# Patient Record
Sex: Female | Born: 1960
Health system: Southern US, Community
[De-identification: ages and names within clinical notes are randomized; demographics above are authoritative.]

## PROBLEM LIST (undated history)

## (undated) DIAGNOSIS — C801 Malignant (primary) neoplasm, unspecified: Secondary | ICD-10-CM

## (undated) DIAGNOSIS — C189 Malignant neoplasm of colon, unspecified: Secondary | ICD-10-CM

## (undated) DIAGNOSIS — R109 Unspecified abdominal pain: Secondary | ICD-10-CM

## (undated) DIAGNOSIS — E119 Type 2 diabetes mellitus without complications: Secondary | ICD-10-CM

## (undated) DIAGNOSIS — K469 Unspecified abdominal hernia without obstruction or gangrene: Secondary | ICD-10-CM

## (undated) DIAGNOSIS — T8859XA Other complications of anesthesia, initial encounter: Secondary | ICD-10-CM

## (undated) DIAGNOSIS — Z8719 Personal history of other diseases of the digestive system: Secondary | ICD-10-CM

## (undated) DIAGNOSIS — T4145XA Adverse effect of unspecified anesthetic, initial encounter: Secondary | ICD-10-CM

## (undated) DIAGNOSIS — R51 Headache: Secondary | ICD-10-CM

## (undated) DIAGNOSIS — R519 Headache, unspecified: Secondary | ICD-10-CM

## (undated) DIAGNOSIS — R198 Other specified symptoms and signs involving the digestive system and abdomen: Secondary | ICD-10-CM

## (undated) DIAGNOSIS — D649 Anemia, unspecified: Secondary | ICD-10-CM

## (undated) HISTORY — PX: COLON SURGERY: SHX602

## (undated) HISTORY — DX: Unspecified abdominal pain: R10.9

## (undated) HISTORY — PX: COLONOSCOPY: SHX174

## (undated) HISTORY — PX: POLYPECTOMY: SHX149

## (undated) HISTORY — DX: Personal history of other diseases of the digestive system: Z87.19

## (undated) HISTORY — DX: Malignant (primary) neoplasm, unspecified: C80.1

## (undated) HISTORY — DX: Other specified symptoms and signs involving the digestive system and abdomen: R19.8

## (undated) HISTORY — DX: Unspecified abdominal hernia without obstruction or gangrene: K46.9

## (undated) HISTORY — PX: NOSE SURGERY: SHX723

## (undated) HISTORY — DX: Malignant neoplasm of colon, unspecified: C18.9

## (undated) HISTORY — PX: OTHER SURGICAL HISTORY: SHX169

---

## 1999-12-01 ENCOUNTER — Emergency Department (HOSPITAL_COMMUNITY): Admission: EM | Admit: 1999-12-01 | Discharge: 1999-12-01 | Payer: Self-pay | Admitting: Emergency Medicine

## 2000-04-08 ENCOUNTER — Encounter: Payer: Self-pay | Admitting: Family Medicine

## 2000-04-08 ENCOUNTER — Encounter: Admission: RE | Admit: 2000-04-08 | Discharge: 2000-04-08 | Payer: Self-pay | Admitting: Family Medicine

## 2000-10-13 ENCOUNTER — Ambulatory Visit (HOSPITAL_BASED_OUTPATIENT_CLINIC_OR_DEPARTMENT_OTHER): Admission: RE | Admit: 2000-10-13 | Discharge: 2000-10-13 | Payer: Self-pay | Admitting: Otolaryngology

## 2001-01-23 ENCOUNTER — Emergency Department (HOSPITAL_COMMUNITY): Admission: EM | Admit: 2001-01-23 | Discharge: 2001-01-23 | Payer: Self-pay | Admitting: Emergency Medicine

## 2001-02-05 ENCOUNTER — Ambulatory Visit: Admission: RE | Admit: 2001-02-05 | Discharge: 2001-02-05 | Payer: Self-pay | Admitting: Otolaryngology

## 2001-02-14 ENCOUNTER — Ambulatory Visit (HOSPITAL_COMMUNITY): Admission: RE | Admit: 2001-02-14 | Discharge: 2001-02-14 | Payer: Self-pay | Admitting: Otolaryngology

## 2001-09-25 ENCOUNTER — Encounter: Payer: Self-pay | Admitting: Gastroenterology

## 2001-09-25 ENCOUNTER — Encounter: Admission: RE | Admit: 2001-09-25 | Discharge: 2001-09-25 | Payer: Self-pay | Admitting: Gastroenterology

## 2001-11-07 ENCOUNTER — Ambulatory Visit (HOSPITAL_COMMUNITY): Admission: RE | Admit: 2001-11-07 | Discharge: 2001-11-07 | Payer: Self-pay | Admitting: Gastroenterology

## 2003-05-05 IMAGING — CR DG HAND COMPLETE 3+V*L*
3 series · 3 of 3 positions shown · non-contrast
Comparison: none

CLINICAL DATA: Pain with knot in the base of the first and second metacarpals. 
 LEFT HAND ? THREE VIEW:
 Three views of the left hand reveal no acute bony abnormality.  Minimal degenerative changes are noted.

[view not recorded (1 of 3)]
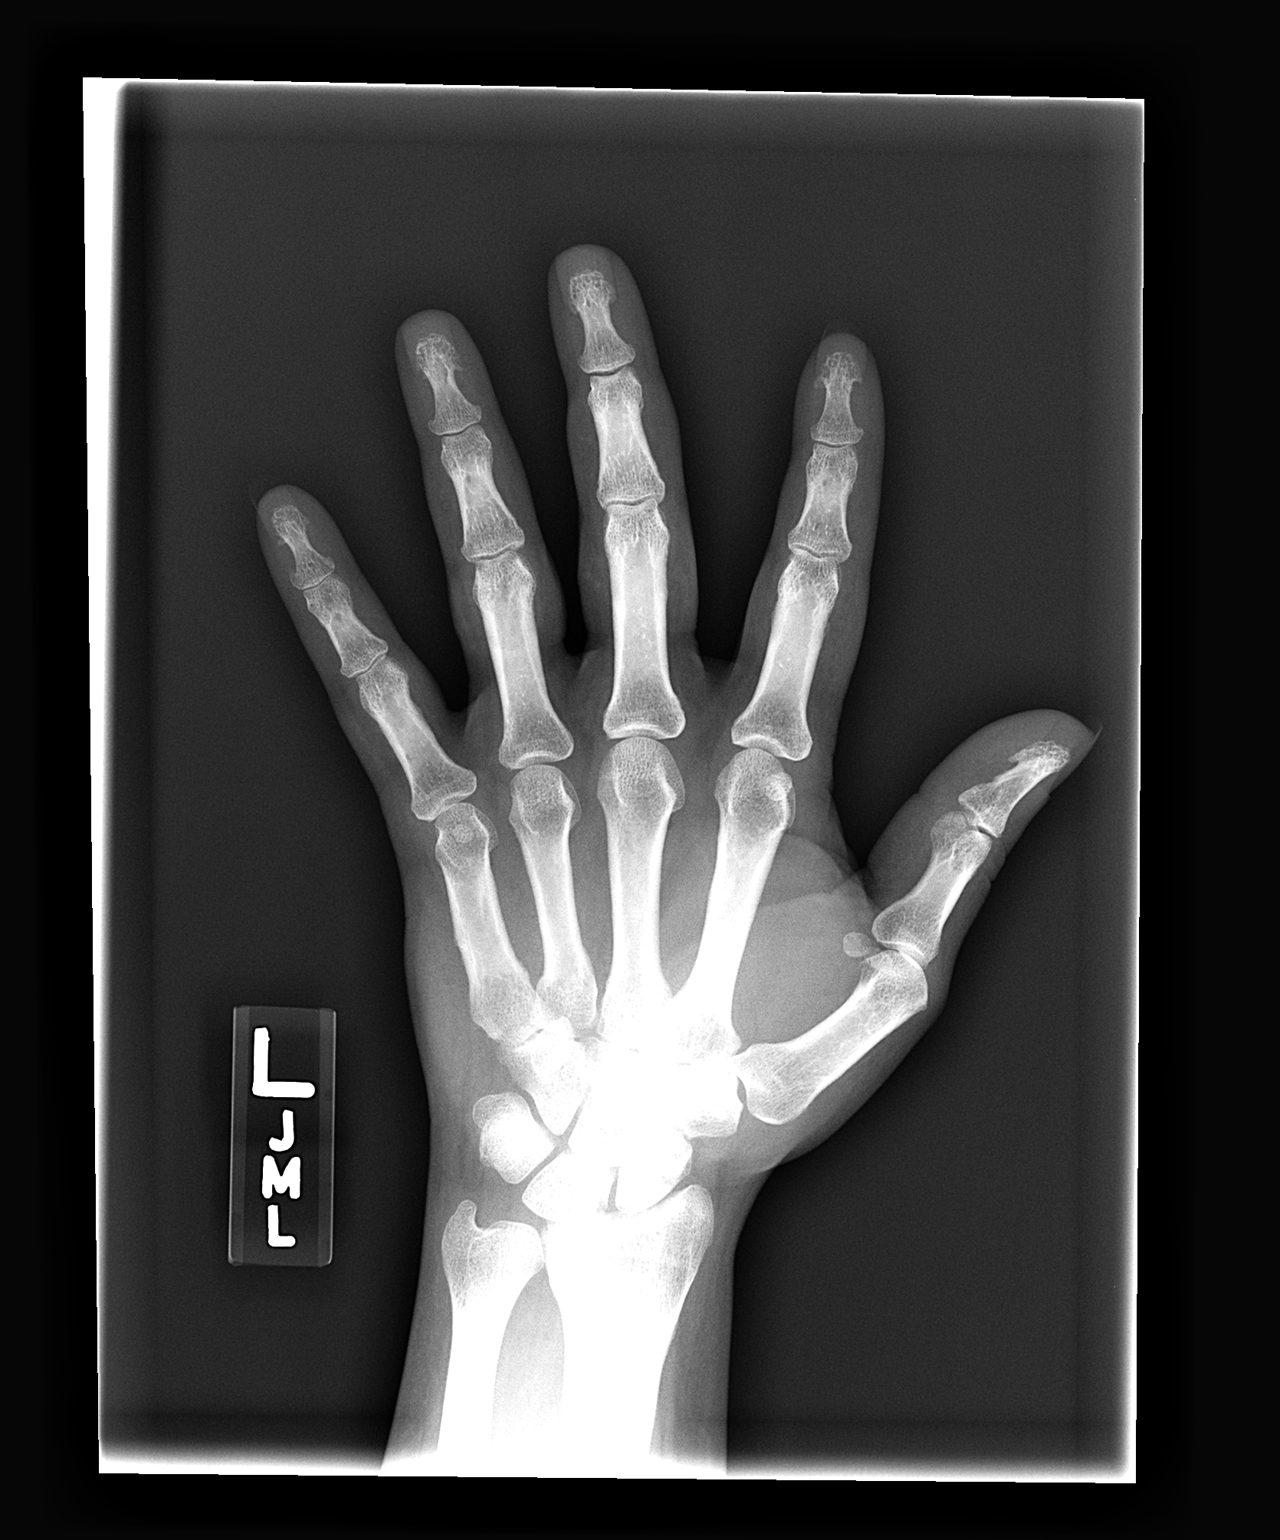

[view not recorded (2 of 3)]
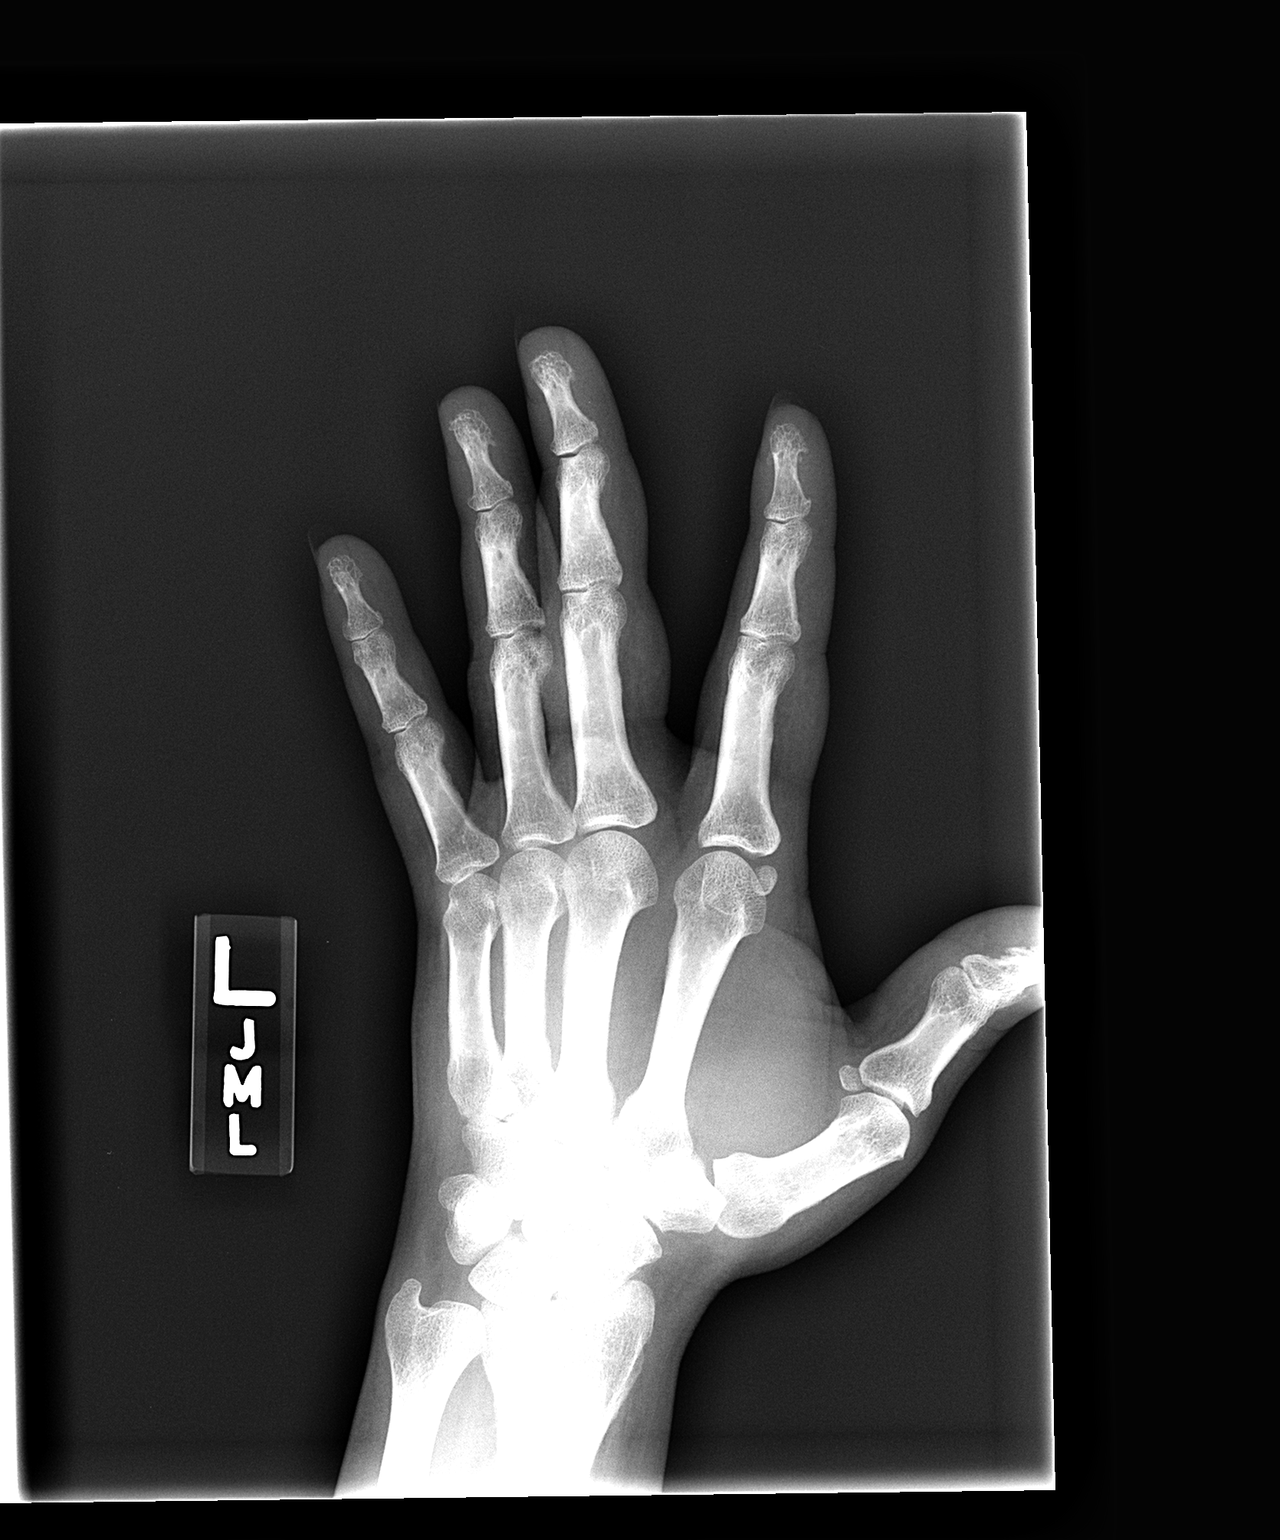

[view not recorded (3 of 3)]
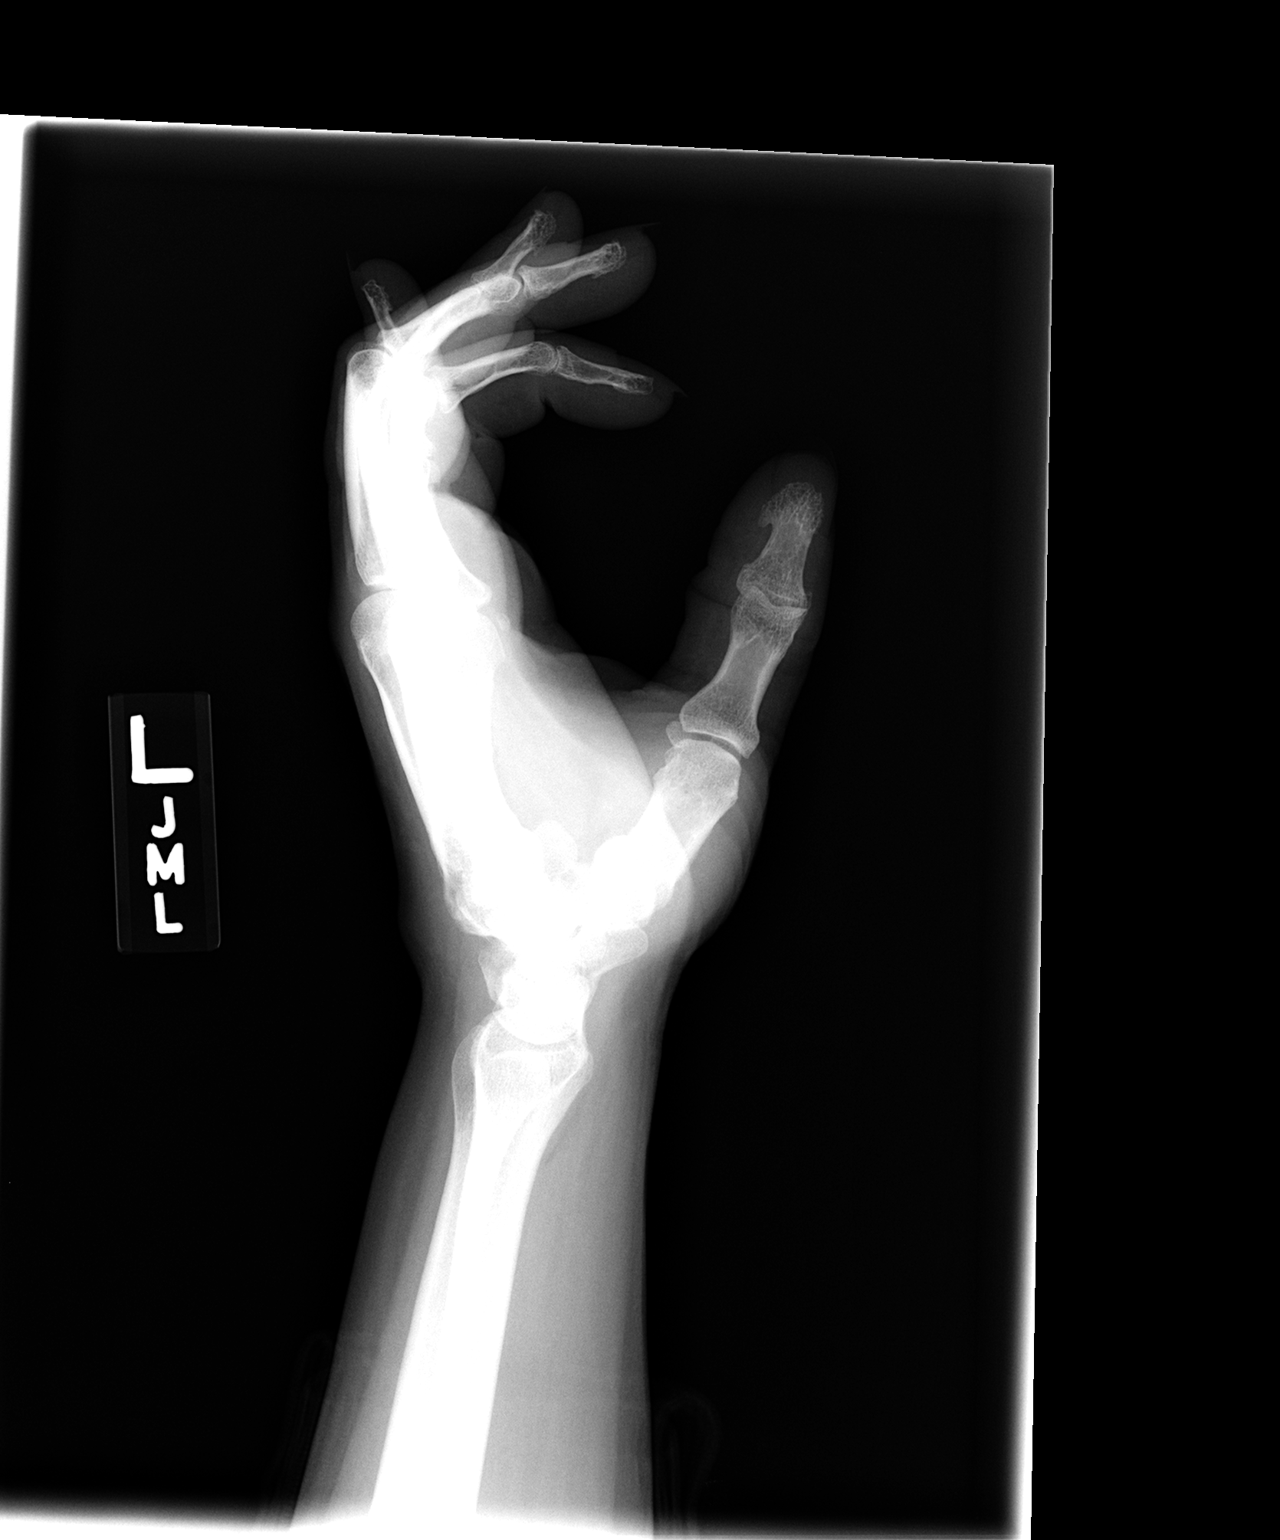

[3 of 3 positions shown; findings below may reference images not displayed]

IMPRESSION: No acute abnormality is noted of the bony structures of the left hand.  Slight degenerative changes are noted, with other discussion as above.

## 2003-10-28 ENCOUNTER — Encounter: Admission: RE | Admit: 2003-10-28 | Discharge: 2003-10-28 | Payer: Self-pay | Admitting: Obstetrics and Gynecology

## 2004-03-13 ENCOUNTER — Emergency Department (HOSPITAL_COMMUNITY): Admission: EM | Admit: 2004-03-13 | Discharge: 2004-03-13 | Payer: Self-pay | Admitting: Family Medicine

## 2004-04-17 ENCOUNTER — Emergency Department (HOSPITAL_COMMUNITY): Admission: EM | Admit: 2004-04-17 | Discharge: 2004-04-17 | Payer: Self-pay | Admitting: Family Medicine

## 2004-08-01 ENCOUNTER — Emergency Department (HOSPITAL_COMMUNITY): Admission: EM | Admit: 2004-08-01 | Discharge: 2004-08-01 | Payer: Self-pay | Admitting: Family Medicine

## 2005-07-17 ENCOUNTER — Other Ambulatory Visit: Admission: RE | Admit: 2005-07-17 | Discharge: 2005-07-17 | Payer: Self-pay | Admitting: Family Medicine

## 2005-08-03 ENCOUNTER — Encounter: Admission: RE | Admit: 2005-08-03 | Discharge: 2005-08-03 | Payer: Self-pay | Admitting: Family Medicine

## 2005-08-03 IMAGING — MG MM SCREEN MAMMOGRAM BILATERAL
6 series · 6 of 6 positions shown · non-contrast
Comparison: none

DG SCREEN MAMMOGRAM BILATERAL
Bilateral CC and MLO view(s) were taken.

SCREENING MAMMOGRAM:
The breast parenchyma is heterogeneously dense.  Possible masses are noted in both breasts.  Spot 
compression views and possibly sonography are recommended for further evaluation.

[R CC]
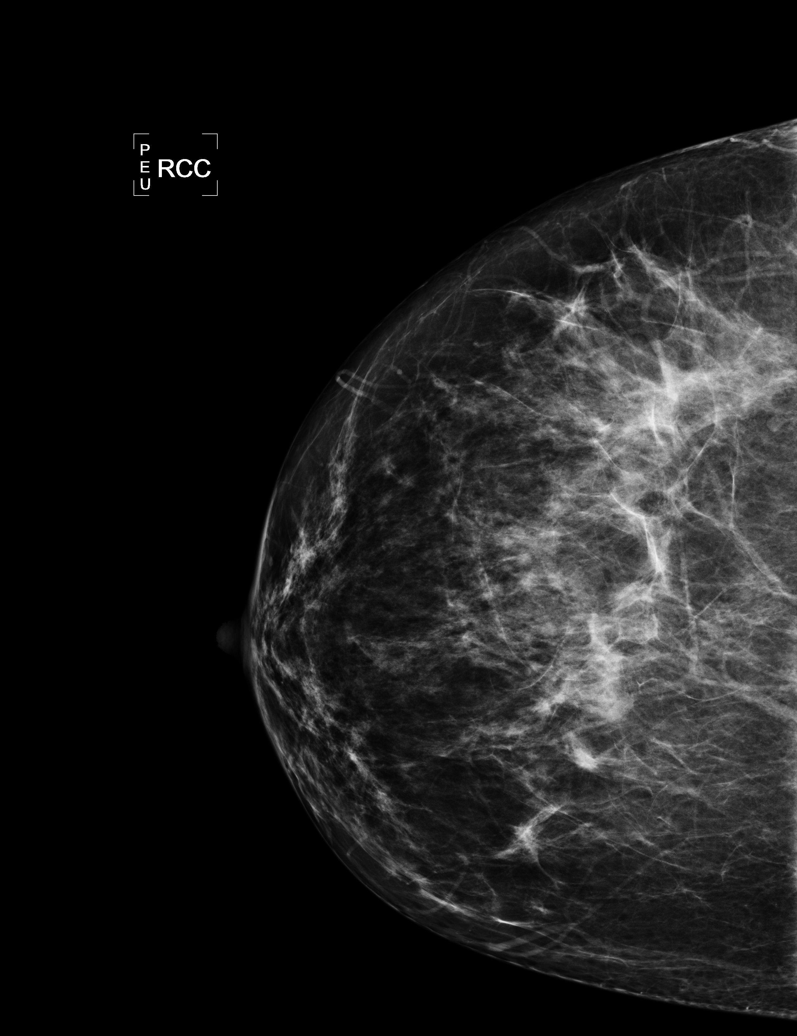

[L CC]
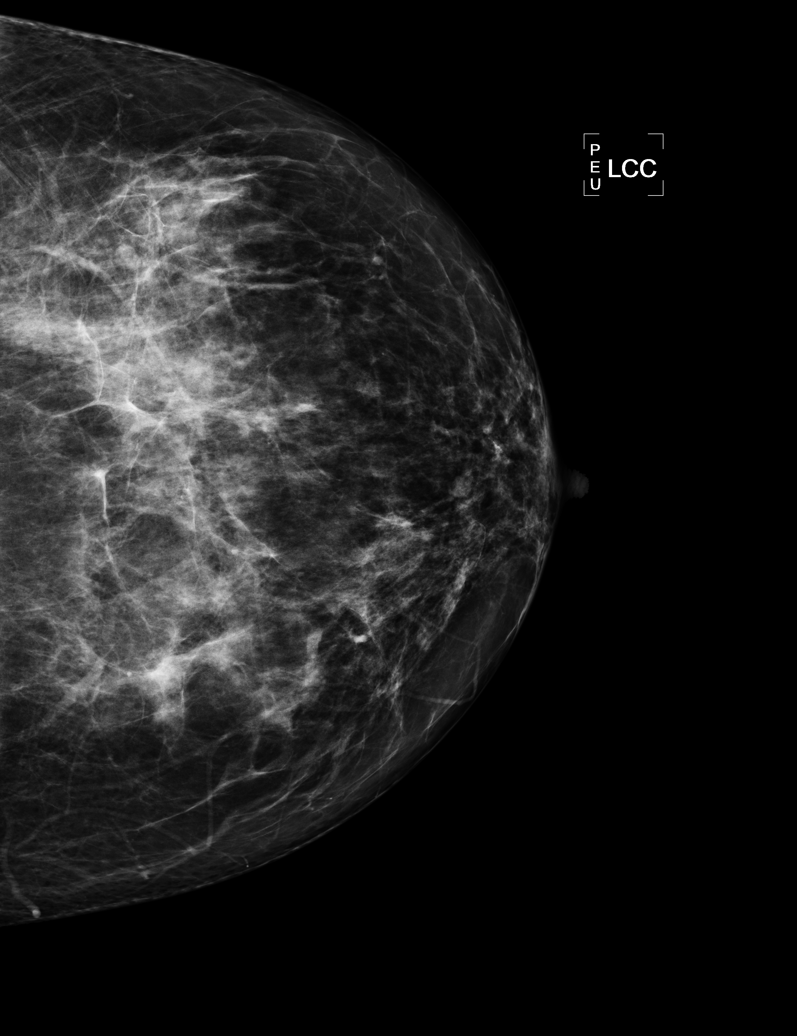

[L MLO (1 of 2)]
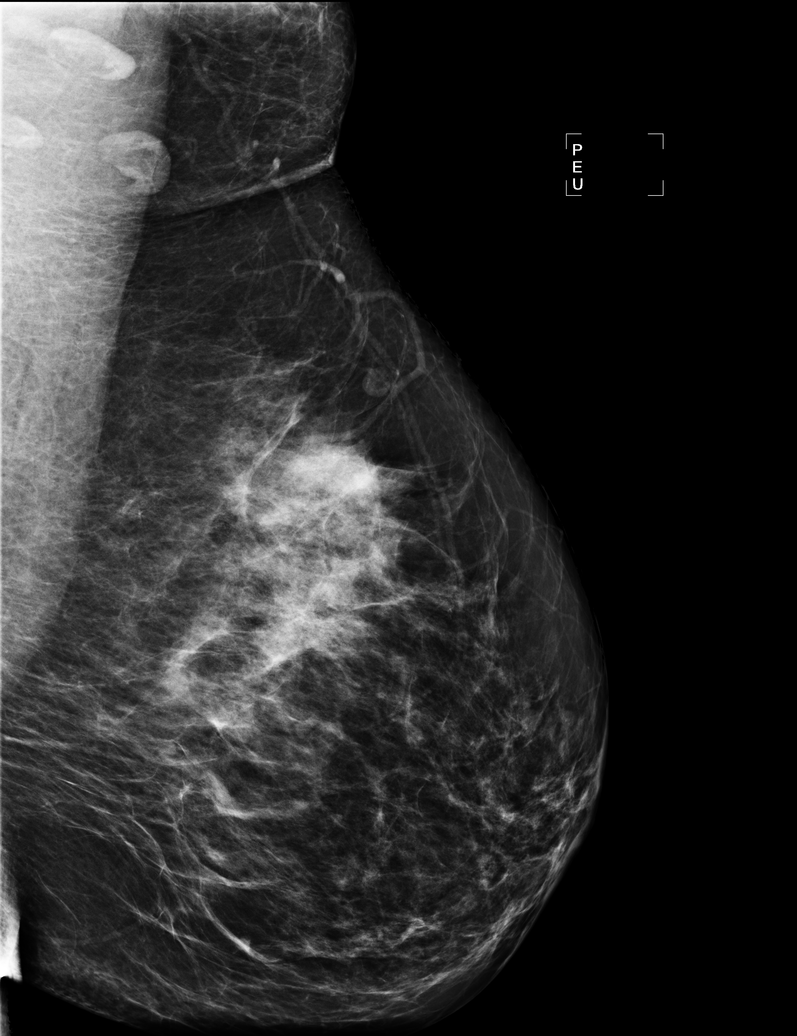

[R MLO (1 of 2)]
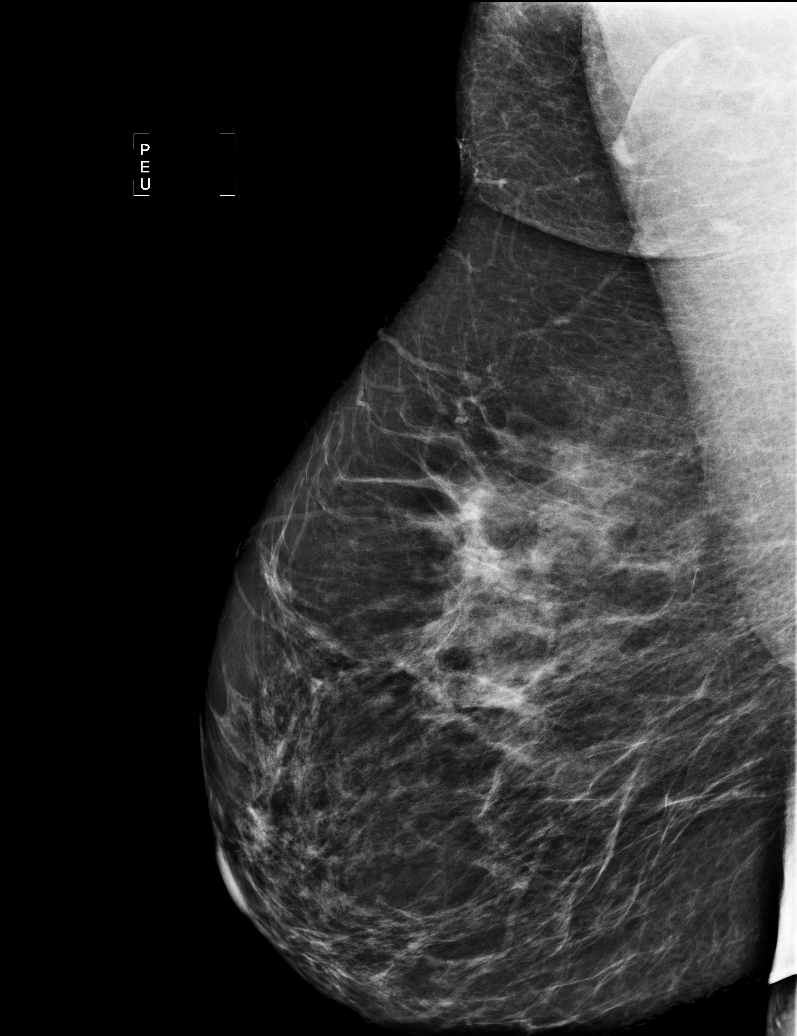

[R MLO (2 of 2)]
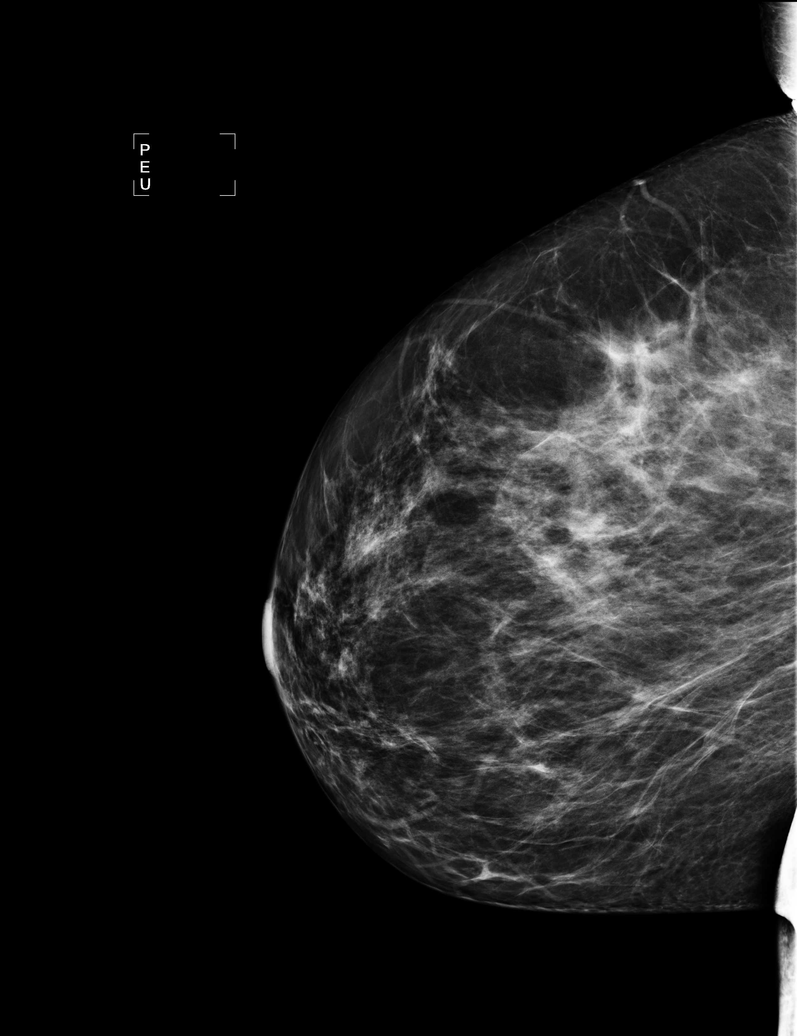

[L MLO (2 of 2)]
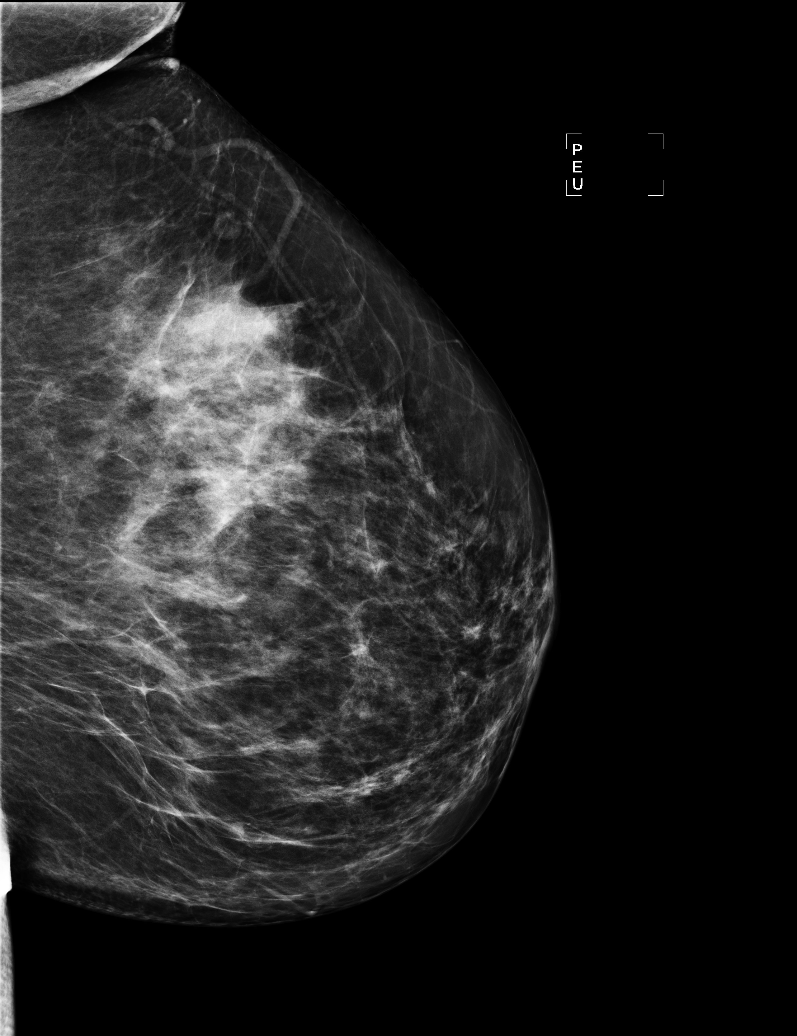

[6 of 6 positions shown; findings below may reference images not displayed]

IMPRESSION: Possible masses, bilaterally.  Additional evaluation is indicated.  The patient will be contacted 
for additional studies and a supplementary report will follow.

ASSESSMENT: Need additional imaging evaluation and/or prior mammograms for comparison - BI-RADS 0

Further imaging of both breasts.
ANALYZED BY COMPUTER AIDED DETECTION. , THIS PROCEDURE WAS A DIGITAL MAMMOGRAM.

## 2005-08-07 LAB — HM COLONOSCOPY

## 2005-08-14 ENCOUNTER — Ambulatory Visit (HOSPITAL_COMMUNITY): Admission: RE | Admit: 2005-08-14 | Discharge: 2005-08-14 | Payer: Self-pay | Admitting: Gastroenterology

## 2005-08-20 ENCOUNTER — Encounter: Admission: RE | Admit: 2005-08-20 | Discharge: 2005-08-20 | Payer: Self-pay | Admitting: Family Medicine

## 2005-08-20 IMAGING — MG MM DIAGNOSTIC LTD BILATERAL
4 series · 4 of 4 positions shown · non-contrast
Comparison: none

[REDACTED] BILATERAL
Bilateral CC and MLO view(s) were taken.

DIGITAL LIMITED BILATERAL DIAGNOSTIC MAMMOGRAM:
CLINICAL DATA: Patient returns for evaluation of possible mass in each breast noted on recent 
screening study dated [DATE].
Spot compression views of each breast demonstrate no persistent mass or distortion to suggest 
malignancy.

[R CC]
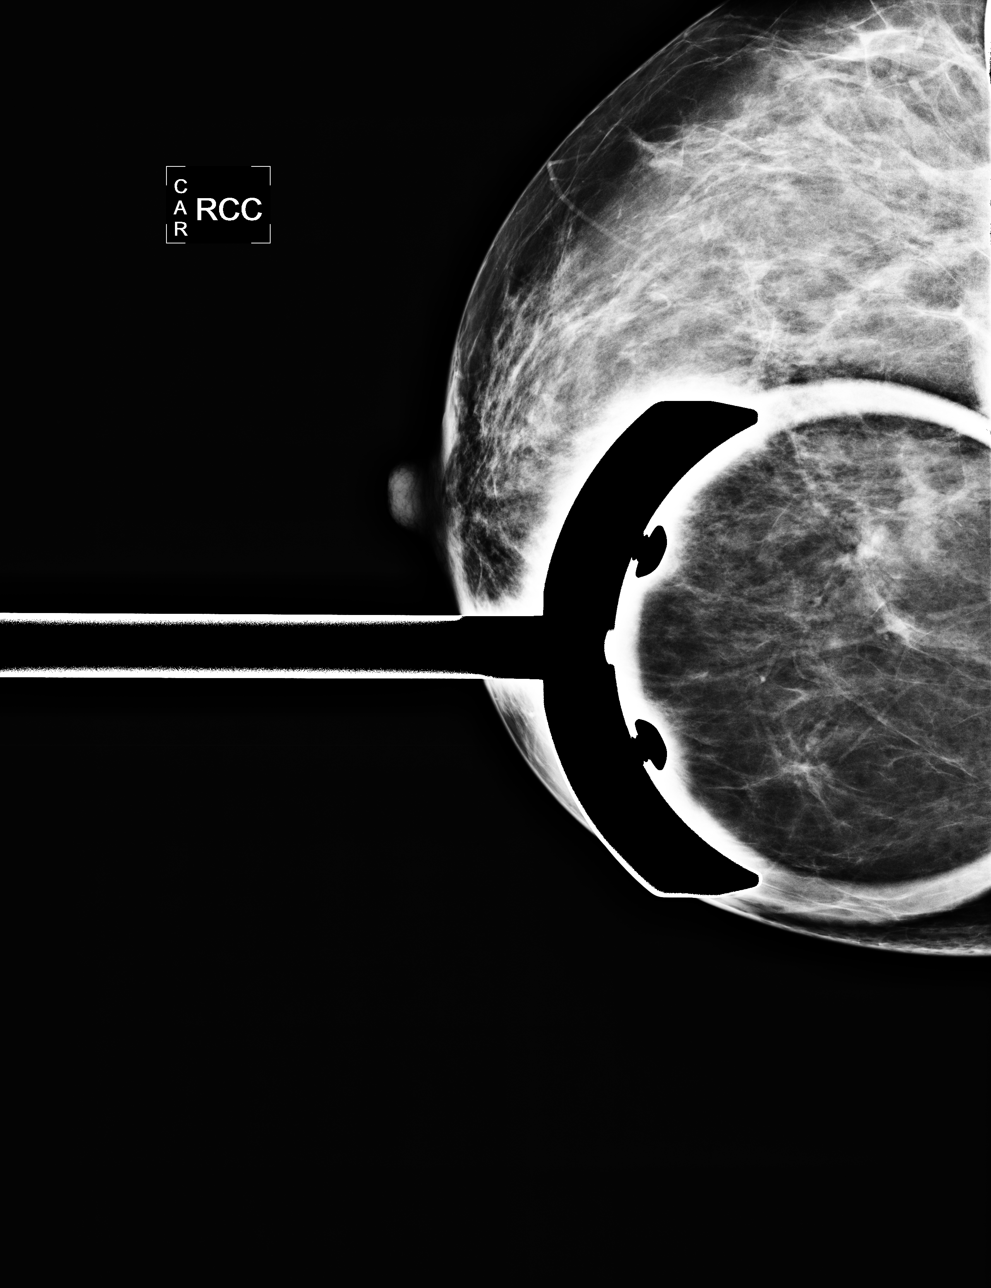

[R MLO]
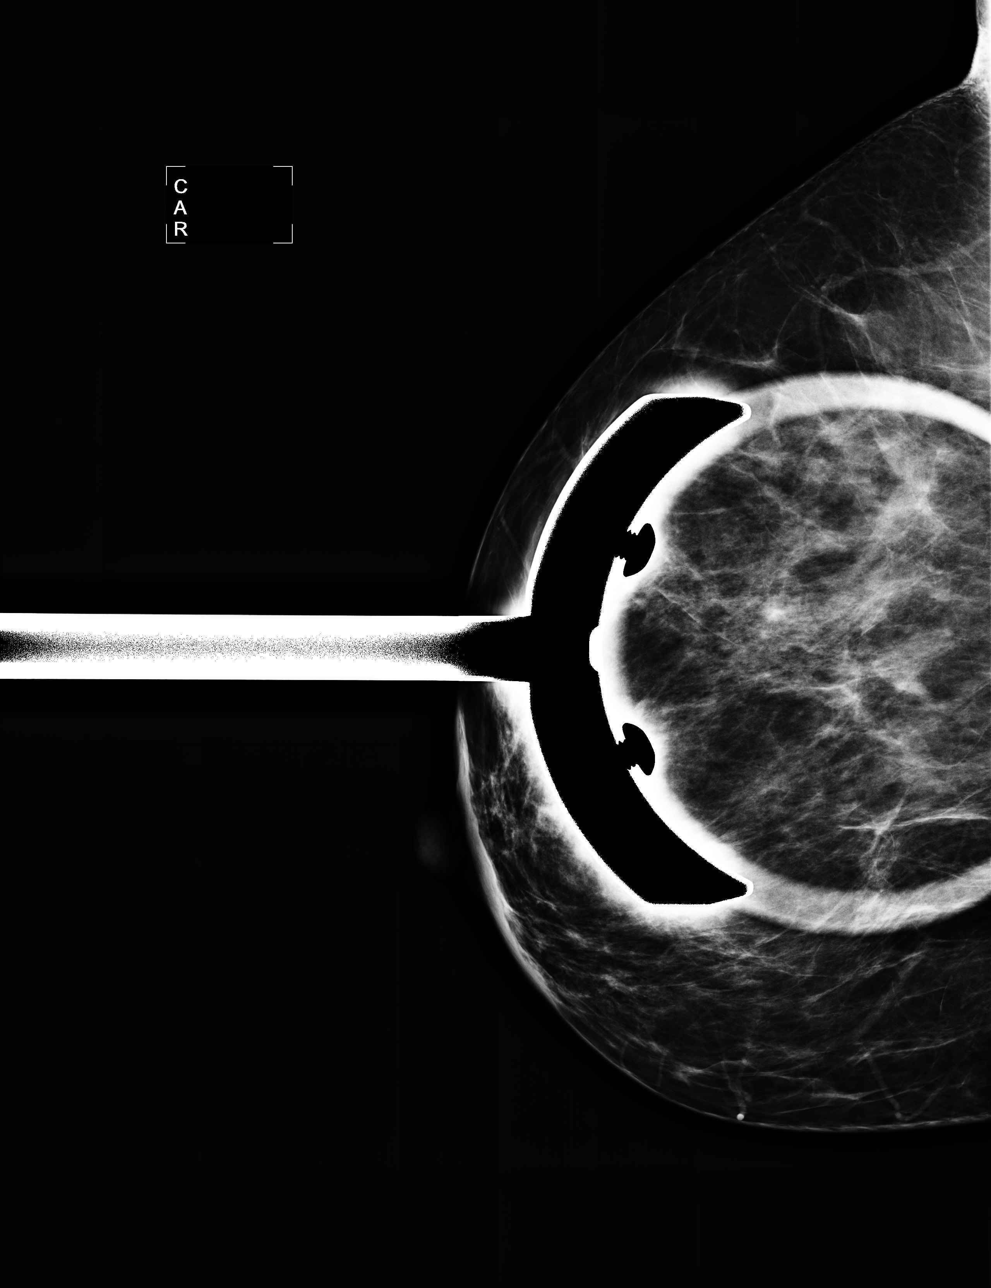

[L CC]
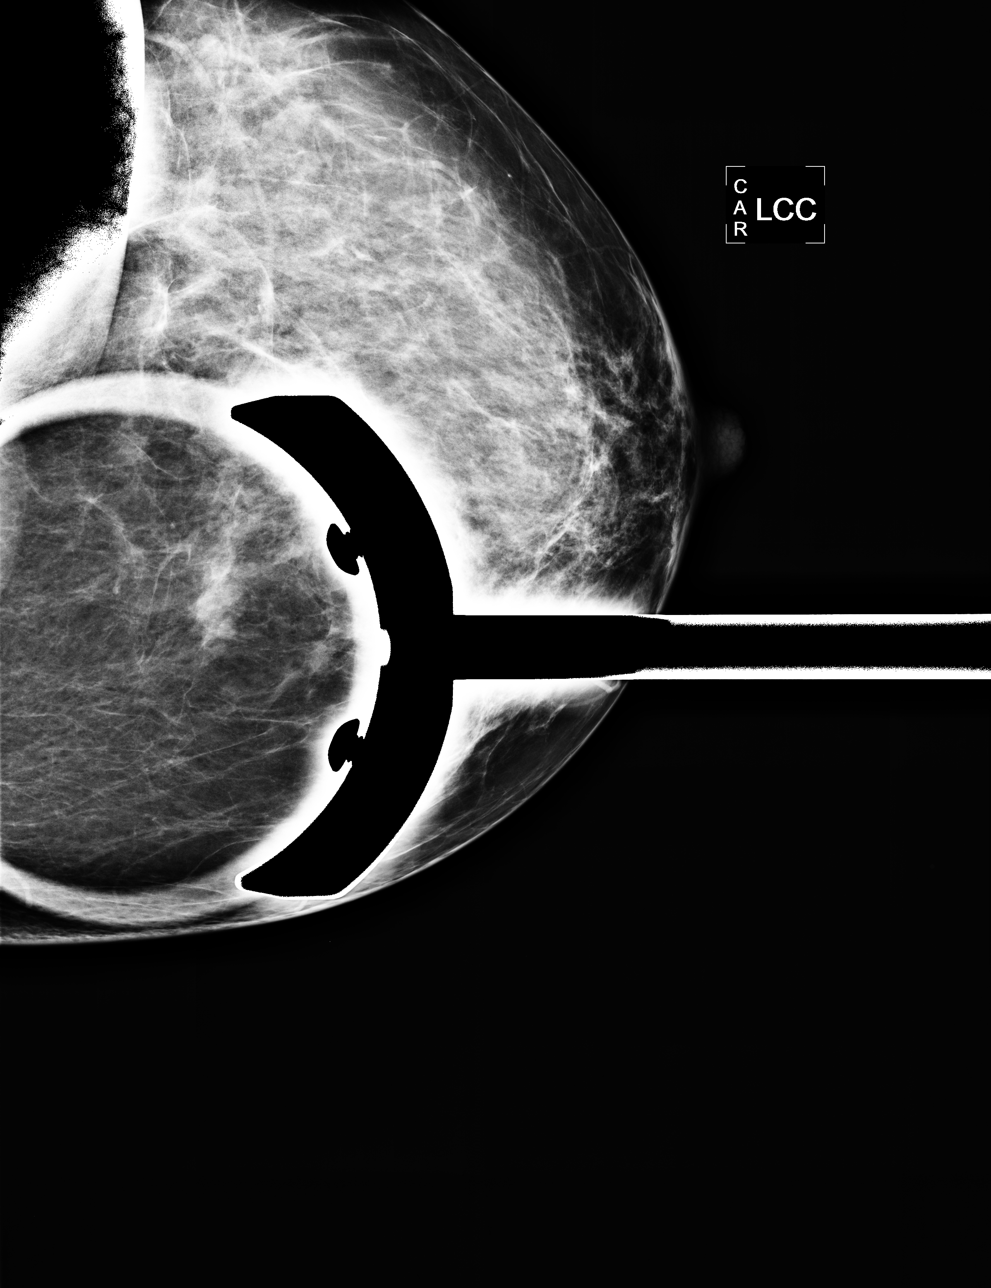

[L MLO]
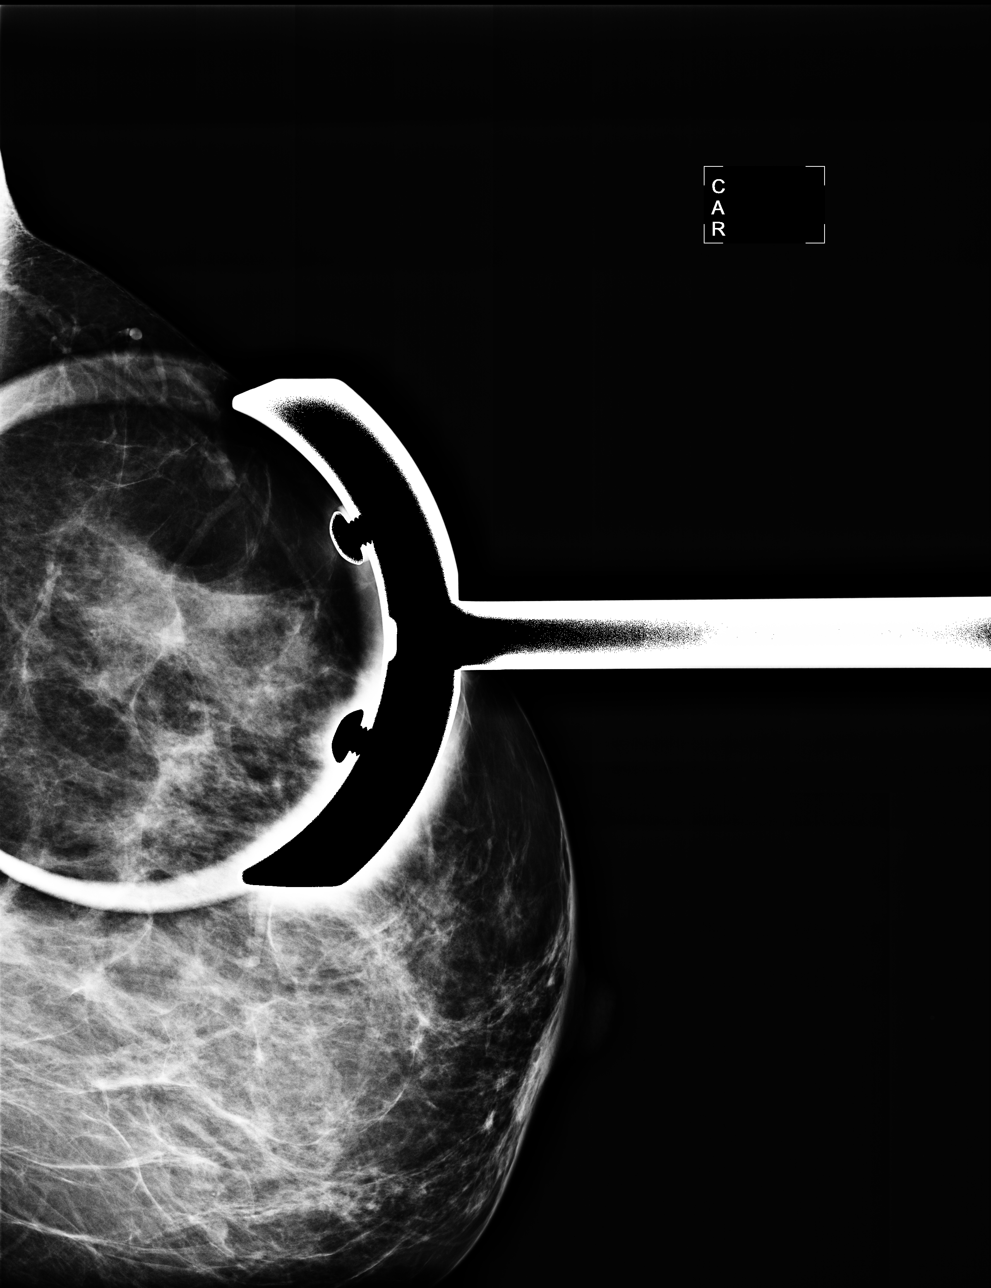

[4 of 4 positions shown; findings below may reference images not displayed]

IMPRESSION: No persistent worrisome abnormality upon additional imaging of the breasts.  Yearly screening 
mammography is suggested.

ASSESSMENT: Negative - BI-RADS 1

Routine screening mammogram of both breasts in 1 year.
, THIS PROCEDURE WAS A DIGITAL MAMMOGRAM.

## 2005-09-10 ENCOUNTER — Encounter: Admission: RE | Admit: 2005-09-10 | Discharge: 2005-09-10 | Payer: Self-pay | Admitting: General Surgery

## 2005-09-10 IMAGING — CT CT PELVIS W/ CM
2 of 5 series · 15 of 42 positions shown, 19 images · IV contrast (READICAT/WATER & [ID] OMNI 300)
Comparison: None.
COMPARISON: None.

CLINICAL DATA: 45-year-old female with new diagnosis of rectal carcinoma for staging.
ABDOMEN CT WITH CONTRAST:
TECHNIQUE: Multidetector CT imaging of the abdomen was performed following the standard protocol during bolus administration of intravenous contrast.
Contrast:  100 cc of Omnipaque 300.
TECHNIQUE: Multidetector CT imaging of the pelvis was performed following the standard protocol during bolus administration of intravenous contrast.

[Series 102: routine abd/pelvis · axial · 0.70mm/px · z∈[-304,+52]mm · 12 of 329 slices shown, 16 images]
[im 30/329  soft-tissue]
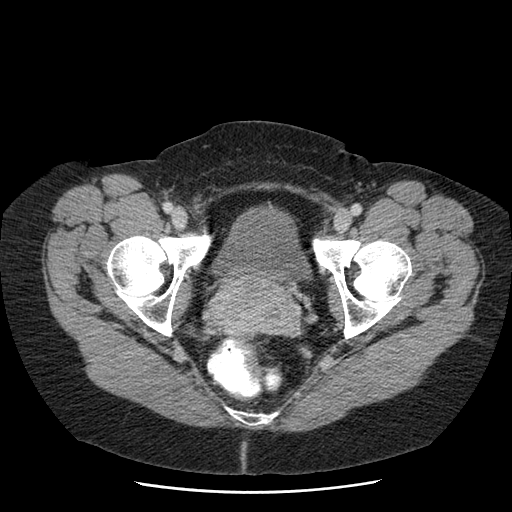
[im 30/329  bone]
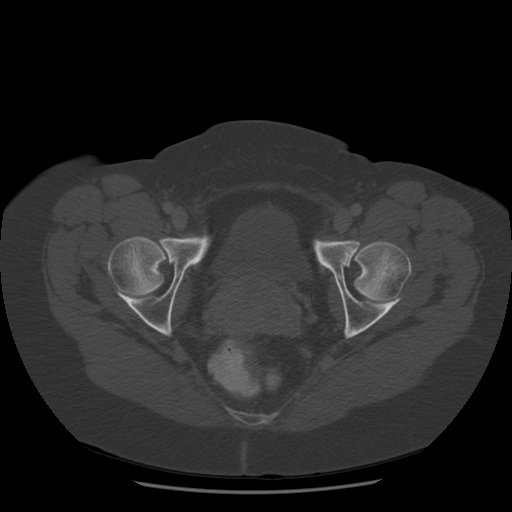
[im 60/329  soft-tissue]
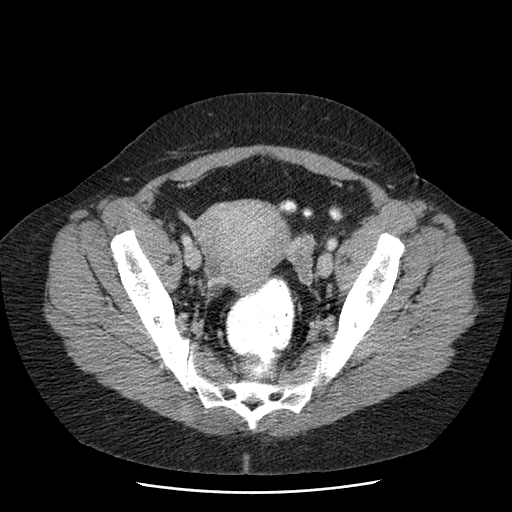
[im 90/329  soft-tissue]
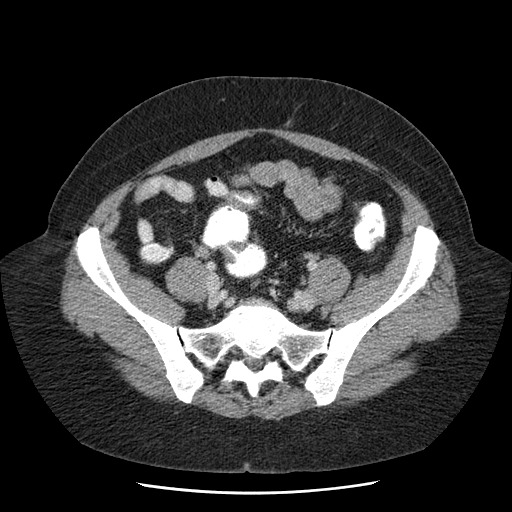
[im 120/329  soft-tissue]
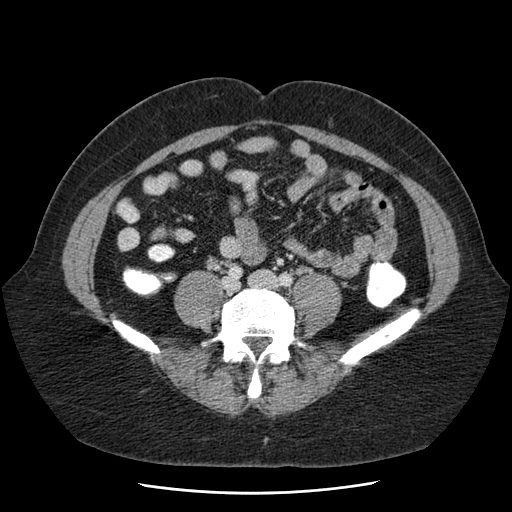
[im 150/329  soft-tissue]
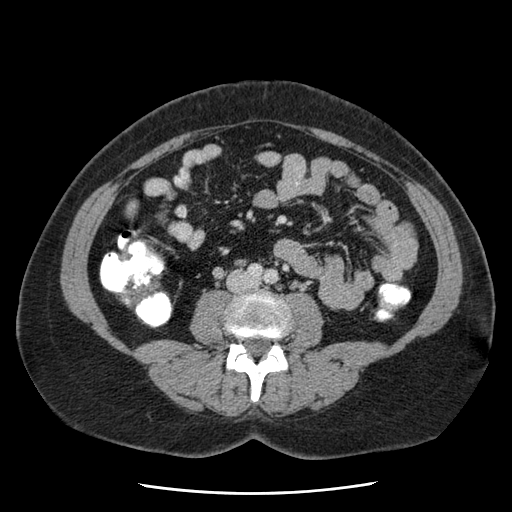
[im 179/329  soft-tissue]
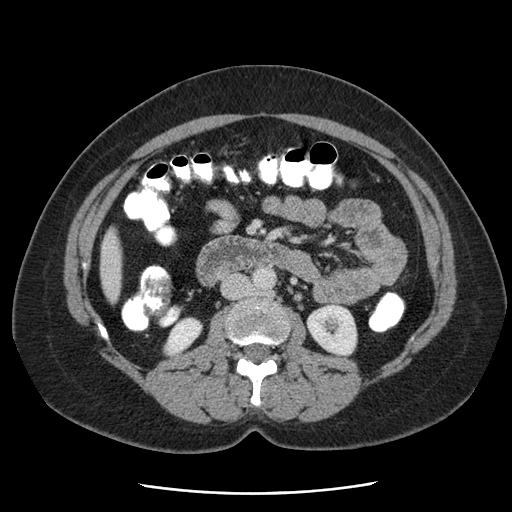
[im 209/329  soft-tissue]
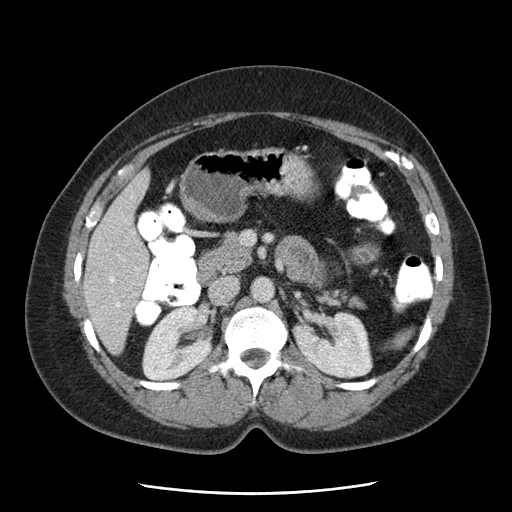
[im 239/329  soft-tissue]
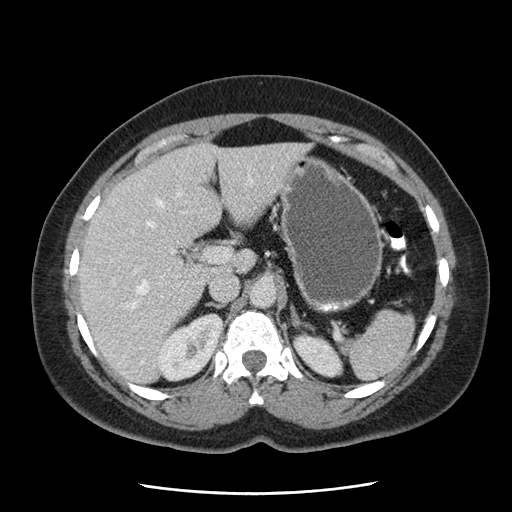
[im 269/329  soft-tissue]
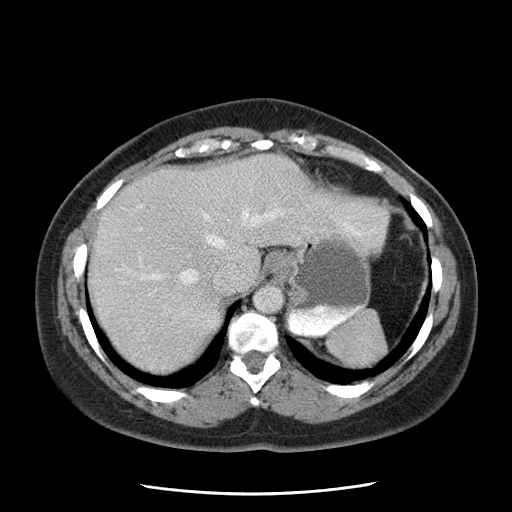
[im 269/329  lung]
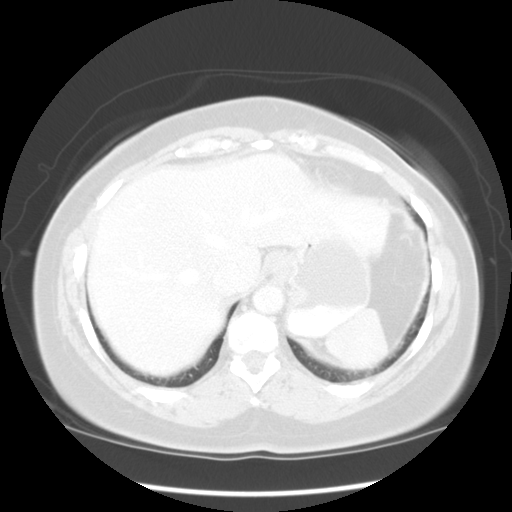
[im 269/329  bone]
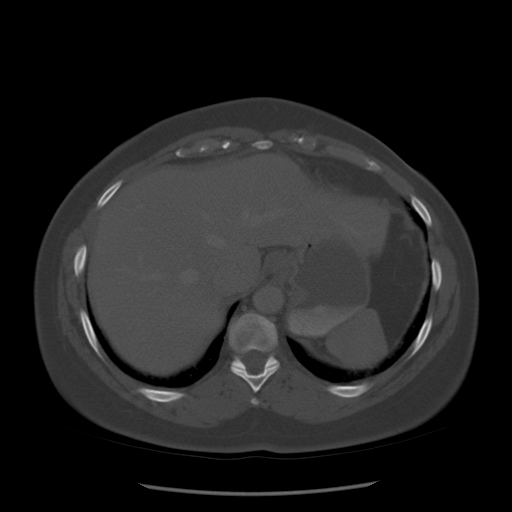
[im 284/329  lung]
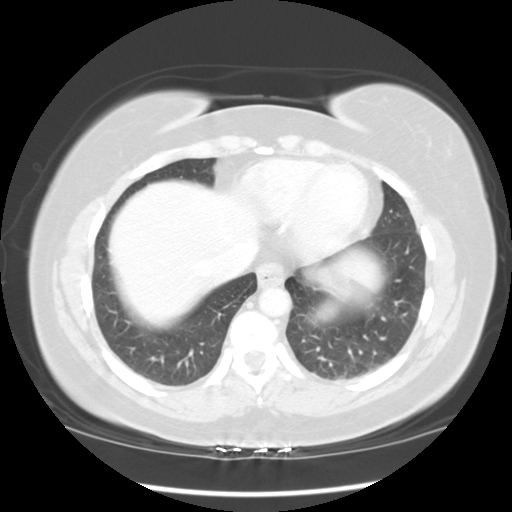
[im 299/329  soft-tissue]
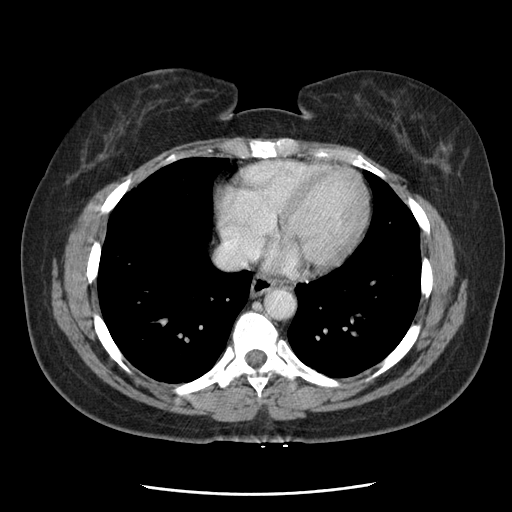
[im 299/329  lung]
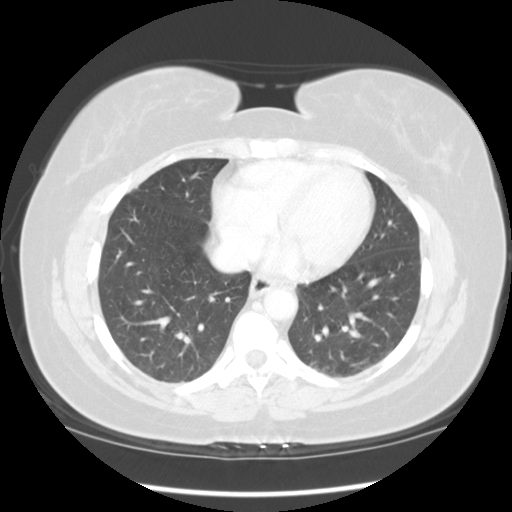
[im 314/329  lung]
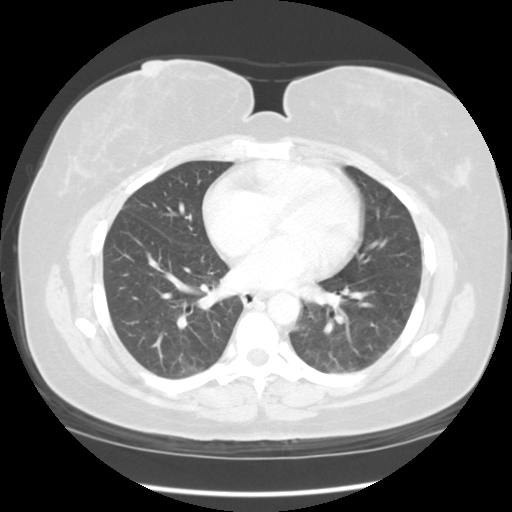

[Series 223: reformatted · sagittal · 0.93mm/px · 3 of 126 slices shown]
[im 42/126  soft-tissue]
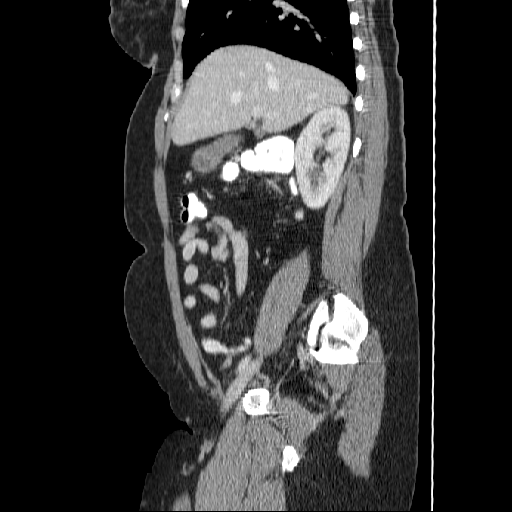
[im 56/126  soft-tissue]
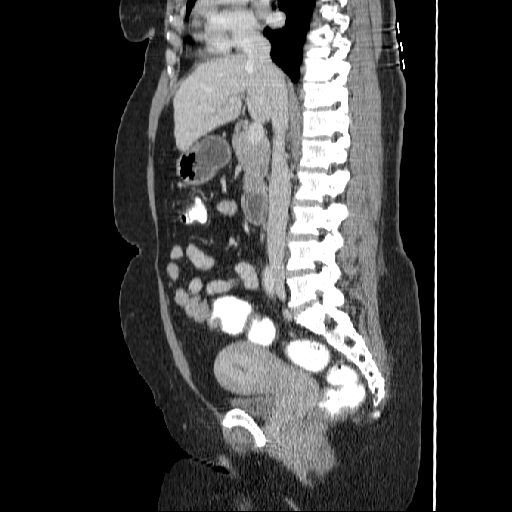
[im 70/126  soft-tissue]
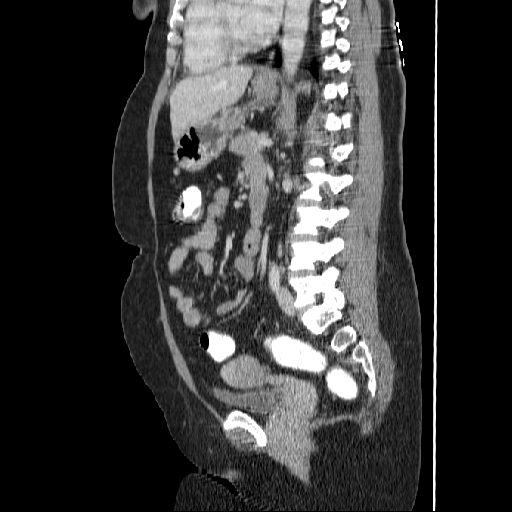

[15 of 42 positions shown; findings below may reference images not displayed]

A 2.5 mm low density lesion at the dome of the liver (image 11) and in the lateral left hepatic segment (image 17) are indeterminate but likely benign. These lesions were not identified on the [DATE] ultrasound study.   The remainder of the liver is unremarkable.   The spleen, pancreas, adrenal glands, kidneys, and gallbladder are unremarkable.  No enlarged lymph nodes, free fluid, abdominal aortic aneurysm, or biliary dilatation identified.  The visualized bowel is unremarkable.    Incidental note is made of a retroaortic left renal vein.
IMPRESSION: Two tiny low density hepatic lesions, probably benign statisically, but given this patient's history of rectal carcinoma, consider interval follow-up in 3-6 months or attention to this area on future studies.
PELVIS CT WITH CONTRAST:
Mild thickening along the anterior left rectal wall distally (images 75 through [DATE] represent this patient's known rectal carcinoma but correlate with endoscopic position.  There is minimal stranding in the perirectal fat.  No enlarged lymph nodes are identified.  The remainder of the bowel is within normal limits.  The bladder and uterus are grossly unremarkable.
IMPRESSION: Mild thickening of the anterior/left distal rectal wall which may represent the patient's known rectal carcinoma.  Minimal perirectal fat stranding is nonspecific - cannot entirely exclude adjacent tumor spread.

## 2005-10-09 IMAGING — CR DG CHEST 2V
2 series · 2 of 2 positions shown · non-contrast
Comparison: none

CLINICAL DATA: Rectal cancer.  Preop respiratory exam for low anterior resection.
 CHEST - 2 VIEW:
 The heart size and mediastinal contours are within normal limits.  Both lungs are clear.  The visualized skeletal structures are unremarkable.

[view not recorded (1 of 2)]
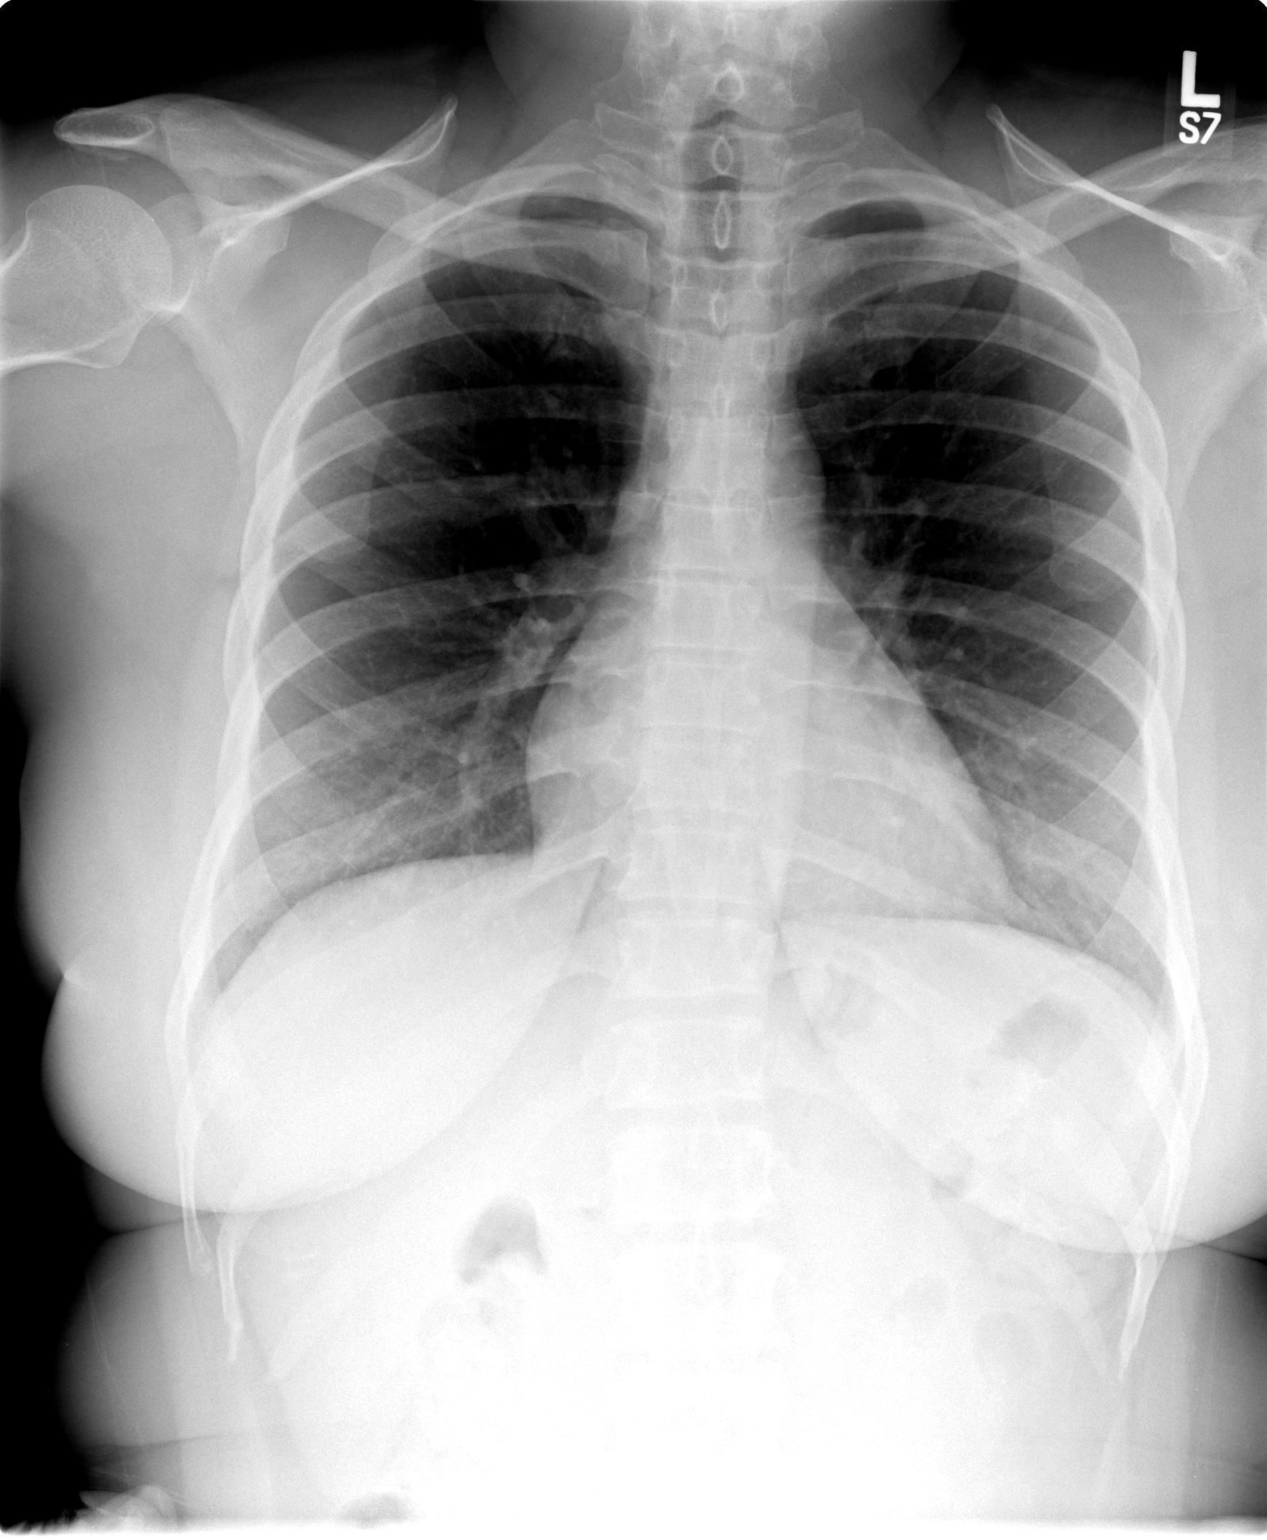

[view not recorded (2 of 2)]
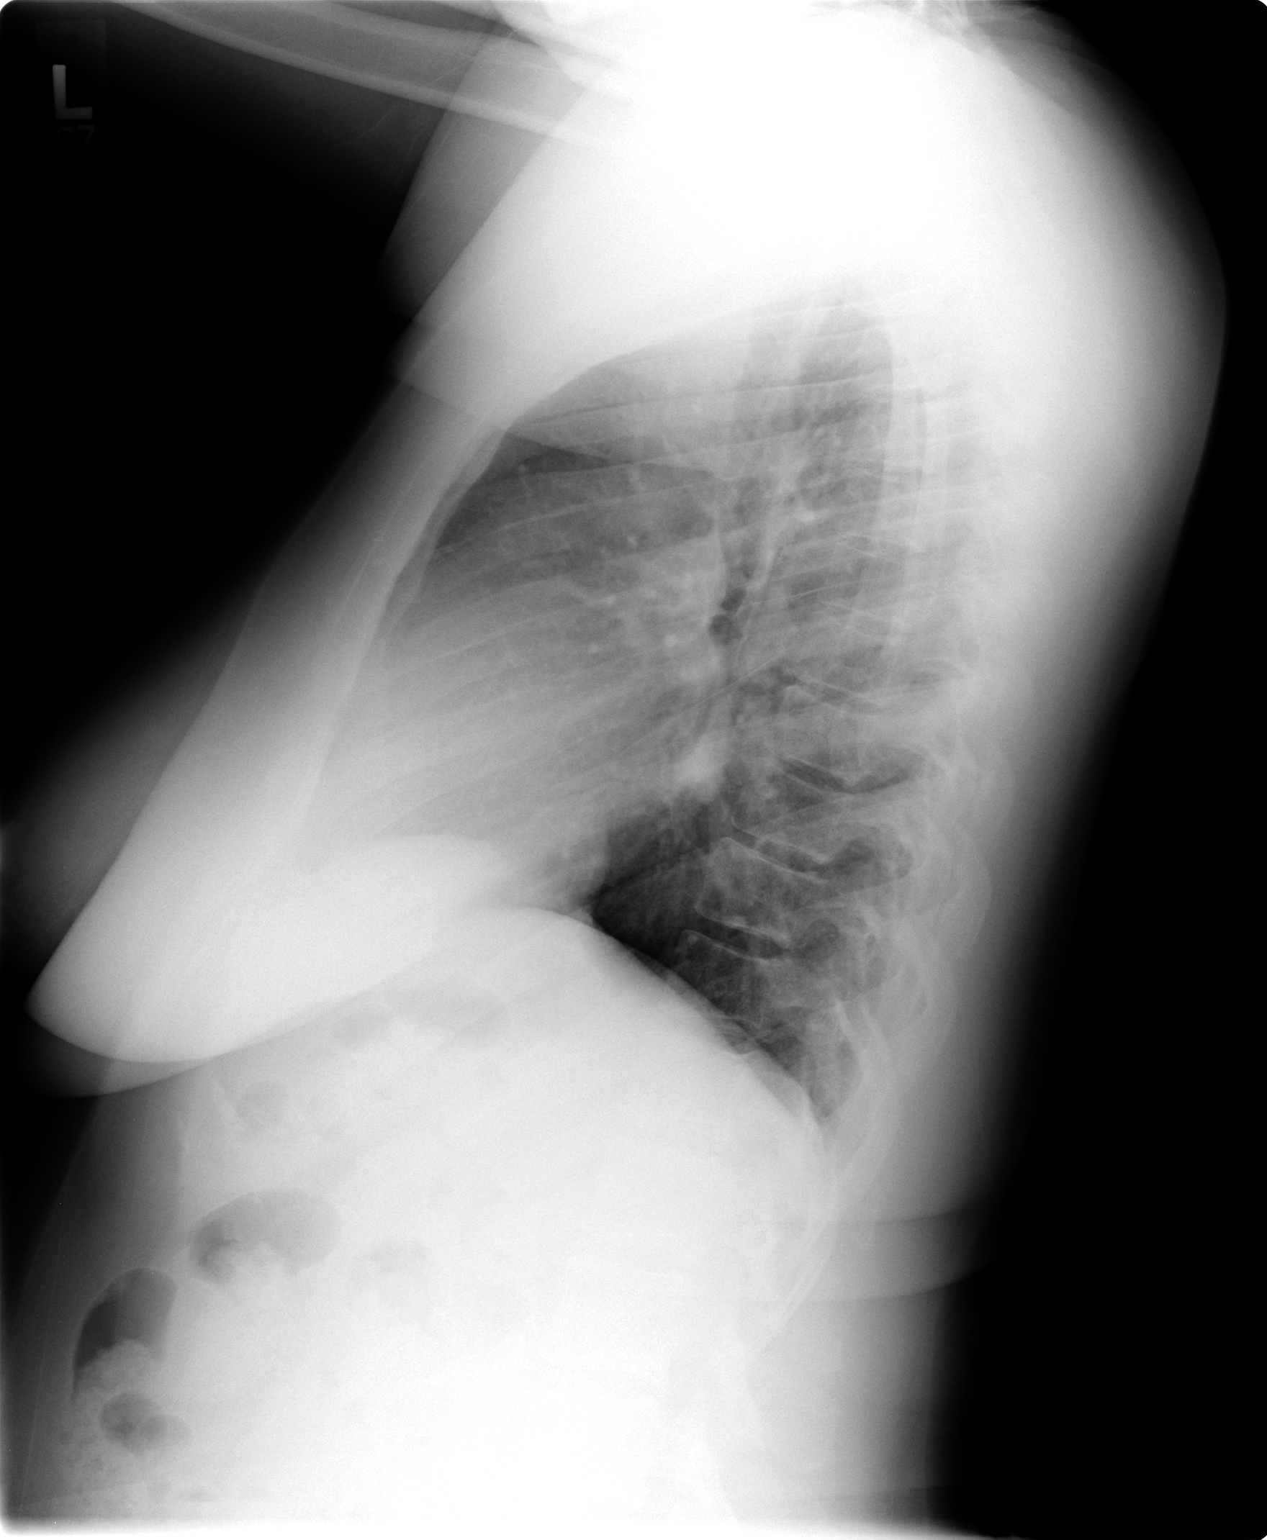

[2 of 2 positions shown; findings below may reference images not displayed]

IMPRESSION: No active cardiopulmonary disease.

## 2005-10-11 ENCOUNTER — Ambulatory Visit (HOSPITAL_COMMUNITY): Admission: RE | Admit: 2005-10-11 | Discharge: 2005-10-11 | Payer: Self-pay | Admitting: General Surgery

## 2005-10-11 IMAGING — CT NM PET TUM IMG SKULL BASE T - THIGH
4 series · 25 of 25 positions shown · IV contrast ([ID])
Comparison: [DATE].

CLINICAL DATA: Rectal cancer.
 FDG PET-CT TUMOR IMAGING (SKULL BASE TO THIGHS) ? [DATE]: 
 Fasting Blood Glucose:  96.
TECHNIQUE: 17.0 mCi F18-FDG were administered via the right antecubital fossa.  Full ring PET imaging was performed from the skull base through the mid-thighs 100 minutes after injection.  CT data was obtained and used for attenuation correction and anatomic localization only.  (This was not acquired as a diagnostic CT examination.)

[Series 1: pet ac · axial · 3.3mm · 4.69mm/px · z∈[-734,-8]mm · 8 of 223 slices shown]
[im 1/223]
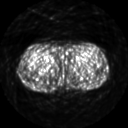
[im 32/223]
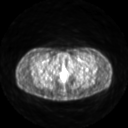
[im 64/223]
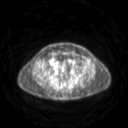
[im 96/223]
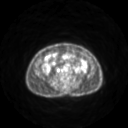
[im 127/223]
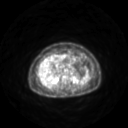
[im 159/223]
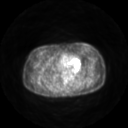
[im 191/223]
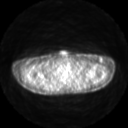
[im 223/223]
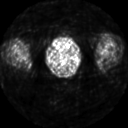

[Series 2: ct images · axial · 3.8mm · 0.98mm/px · z∈[-734,-8]mm · 8 of 223 slices shown]
[im 1/223]
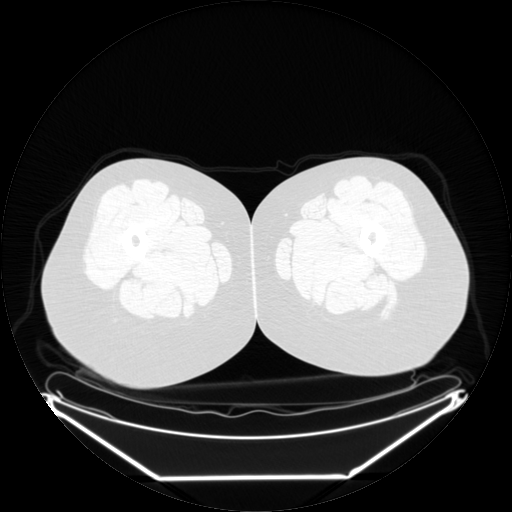
[im 32/223]
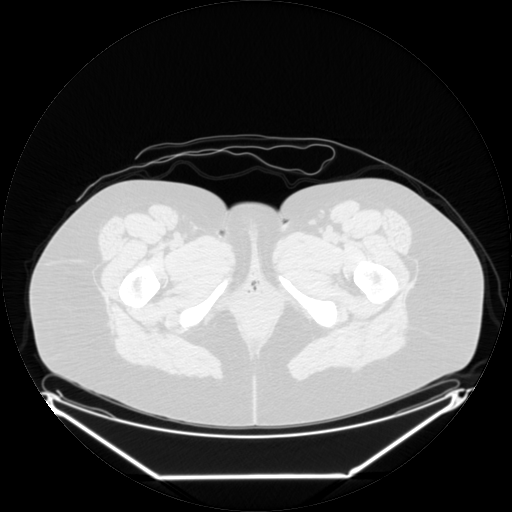
[im 64/223]
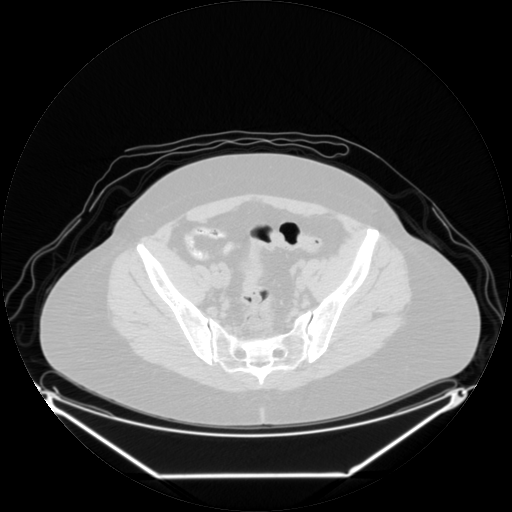
[im 96/223]
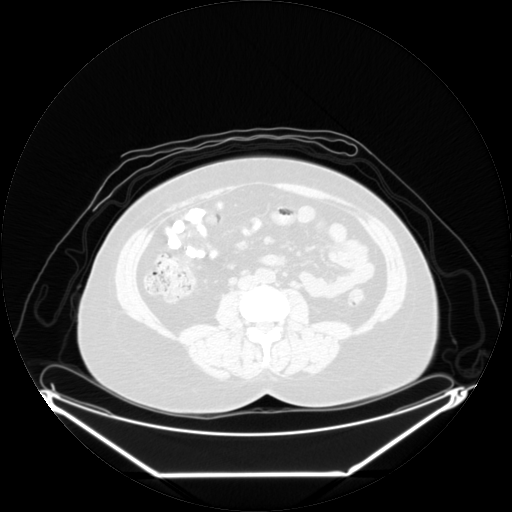
[im 127/223]
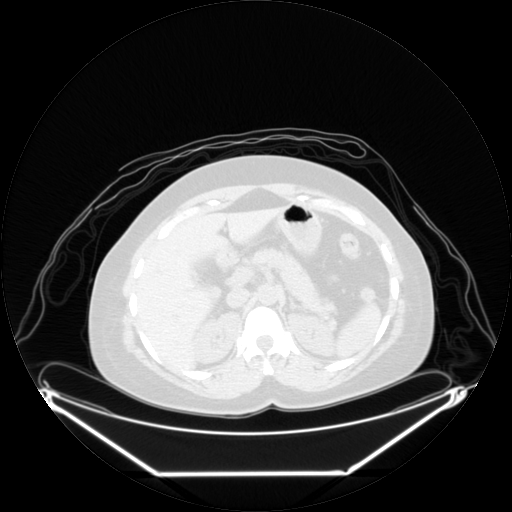
[im 159/223]
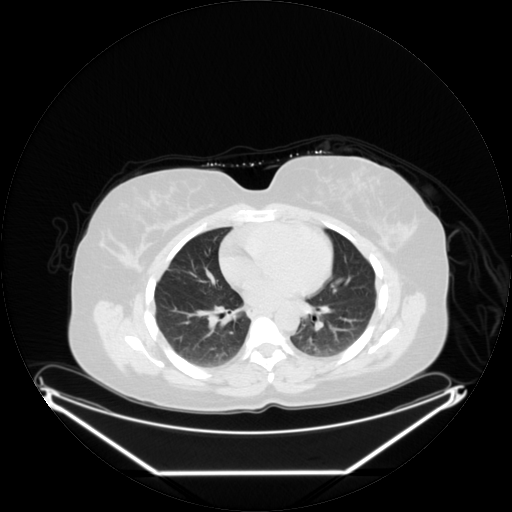
[im 191/223]
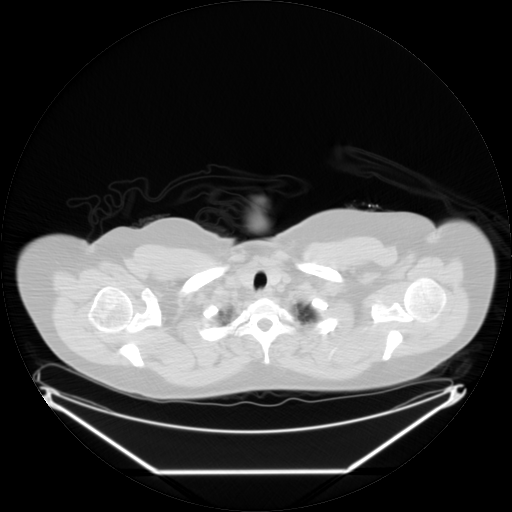
[im 223/223]
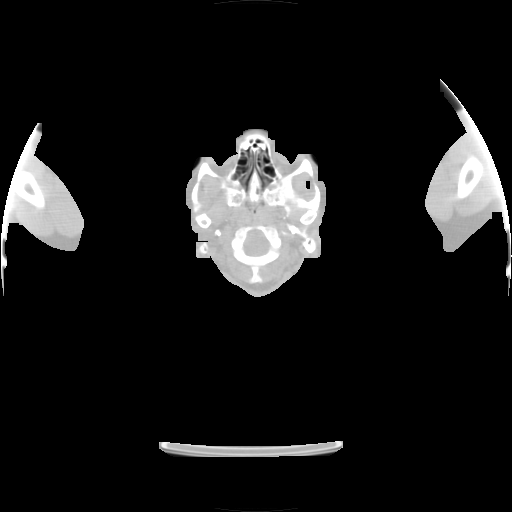

[Series 2: pet nac · axial · 3.3mm · 4.69mm/px · z∈[-734,-8]mm · 8 of 223 slices shown]
[im 1/223]
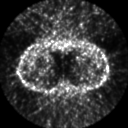
[im 32/223]
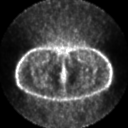
[im 64/223]
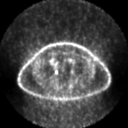
[im 96/223]
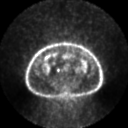
[im 127/223]
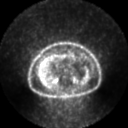
[im 159/223]
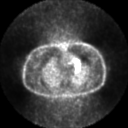
[im 191/223]
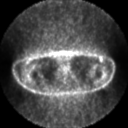
[im 223/223]
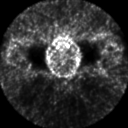

[Series 123: mip · coronal · 3.3mm · 4.69mm/px · 1 of 30 slices shown]
[im 1/30]
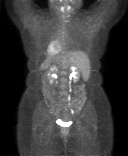

[25 of 25 positions shown; findings below may reference images not displayed]

FINDINGS: There are no hypermetabolic lymph nodes within the soft tissues of the neck.  There are no hypermetabolic axillary lymph nodes are noted.  No hypermetabolic mediastinal or hilar lymph nodes are noted.  There is no pleural or pericardial effusion.
 No hypermetabolic pulmonary nodules or masses are noted.
 There is no abnormal increased uptake within the liver parenchyma.  The spleen is negative.  The pancreas is negative.  The adrenal glands are negative.
 The right kidney is normal.  The left kidney is normal.  
 No hypermetabolic retroperitoneal or mesenteric lymph nodes are noted.
 Amorphous increased uptake within the rectum may represent the patient?s primary rectal carcinoma.
IMPRESSION: 1.  No evidence for hypermetabolic metastatic disease.
 2.  Increased uptake in the region of the rectum which may represent the patient?s primary rectal carcinoma.

## 2005-10-16 ENCOUNTER — Encounter (INDEPENDENT_AMBULATORY_CARE_PROVIDER_SITE_OTHER): Payer: Self-pay | Admitting: *Deleted

## 2005-10-16 ENCOUNTER — Inpatient Hospital Stay (HOSPITAL_COMMUNITY): Admission: RE | Admit: 2005-10-16 | Discharge: 2005-10-23 | Payer: Self-pay | Admitting: General Surgery

## 2005-11-08 ENCOUNTER — Ambulatory Visit: Payer: Self-pay | Admitting: Hematology and Oncology

## 2005-11-09 ENCOUNTER — Ambulatory Visit: Admission: RE | Admit: 2005-11-09 | Discharge: 2006-01-18 | Payer: Self-pay | Admitting: *Deleted

## 2005-11-09 LAB — CBC WITH DIFFERENTIAL/PLATELET
BASO%: 0.9 % (ref 0.0–2.0)
Basophils Absolute: 0 10*3/uL (ref 0.0–0.1)
EOS%: 2.9 % (ref 0.0–7.0)
HGB: 9.9 g/dL — ABNORMAL LOW (ref 11.6–15.9)
MCH: 22.4 pg — ABNORMAL LOW (ref 26.0–34.0)
MCHC: 31.7 g/dL — ABNORMAL LOW (ref 32.0–36.0)
MCV: 70.7 fL — ABNORMAL LOW (ref 81.0–101.0)
MONO%: 12.5 % (ref 0.0–13.0)
RBC: 4.41 10*6/uL (ref 3.70–5.32)
RDW: 15.1 % — ABNORMAL HIGH (ref 11.3–14.5)
lymph#: 1.4 10*3/uL (ref 0.9–3.3)

## 2005-11-09 LAB — CEA: CEA: 1.2 ng/mL (ref 0.0–5.0)

## 2005-11-09 LAB — COMPREHENSIVE METABOLIC PANEL
ALT: <8 U/L (ref 0–40)
Alkaline Phosphatase: 60 U/L (ref 39–117)
CO2: 24 mEq/L (ref 19–32)
Potassium: 3.9 mEq/L (ref 3.5–5.3)
Sodium: 139 mEq/L (ref 135–145)
Total Bilirubin: 0.5 mg/dL (ref 0.3–1.2)
Total Protein: 6.7 g/dL (ref 6.0–8.3)

## 2005-11-09 LAB — TECHNOLOGIST REVIEW

## 2005-11-20 ENCOUNTER — Observation Stay (HOSPITAL_COMMUNITY): Admission: RE | Admit: 2005-11-20 | Discharge: 2005-11-21 | Payer: Self-pay | Admitting: General Surgery

## 2005-11-20 IMAGING — CR DG CHEST 1V PORT
1 series · 1 of 1 positions shown · non-contrast
Comparison: [DATE].

CLINICAL DATA: Rectal cancer.  Central line placement.  Assess for pneumothorax.
 CHEST  PORTABLE - 1 VIEW -   [RE] HOURS:

[view not recorded]
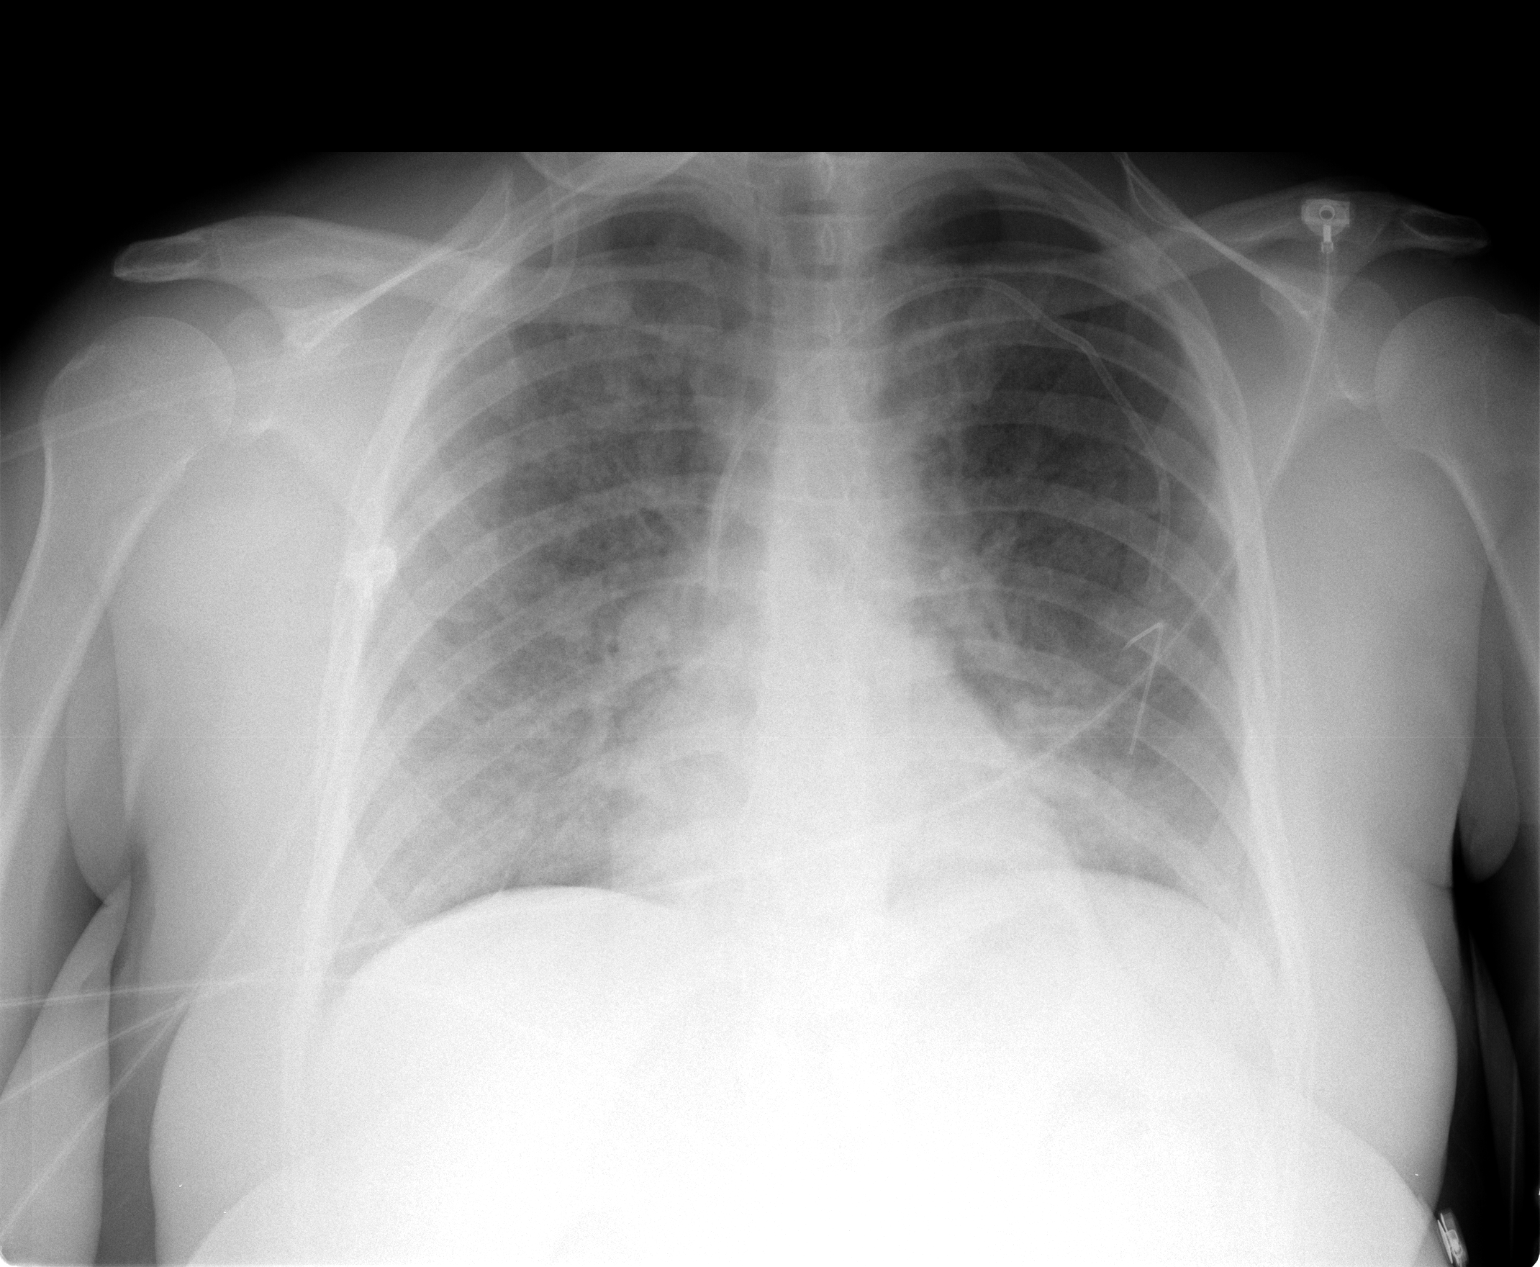

[1 of 1 positions shown; findings below may reference images not displayed]

FINDINGS: A Port-a-cath is in place via the left subclavian vein with the tip in the SVC above the right atrium.  There is diffuse pulmonary density which could be due to pneumonia or edema.  This was not present in [REDACTED].  No pneumothorax.
IMPRESSION: 1.  Port-a-cath well positioned without apparent complication.
 2.  Diffuse lung densities, worse and consistent with pneumonia or edema.

## 2005-11-21 IMAGING — CR DG CHEST 2V
2 series · 2 of 2 positions shown · non-contrast
Comparison: [DATE].

CLINICAL DATA: Rectal carcinoma.  Port-a-cath insertion. 
 CHEST - 2 VIEW:

[w chest pa]
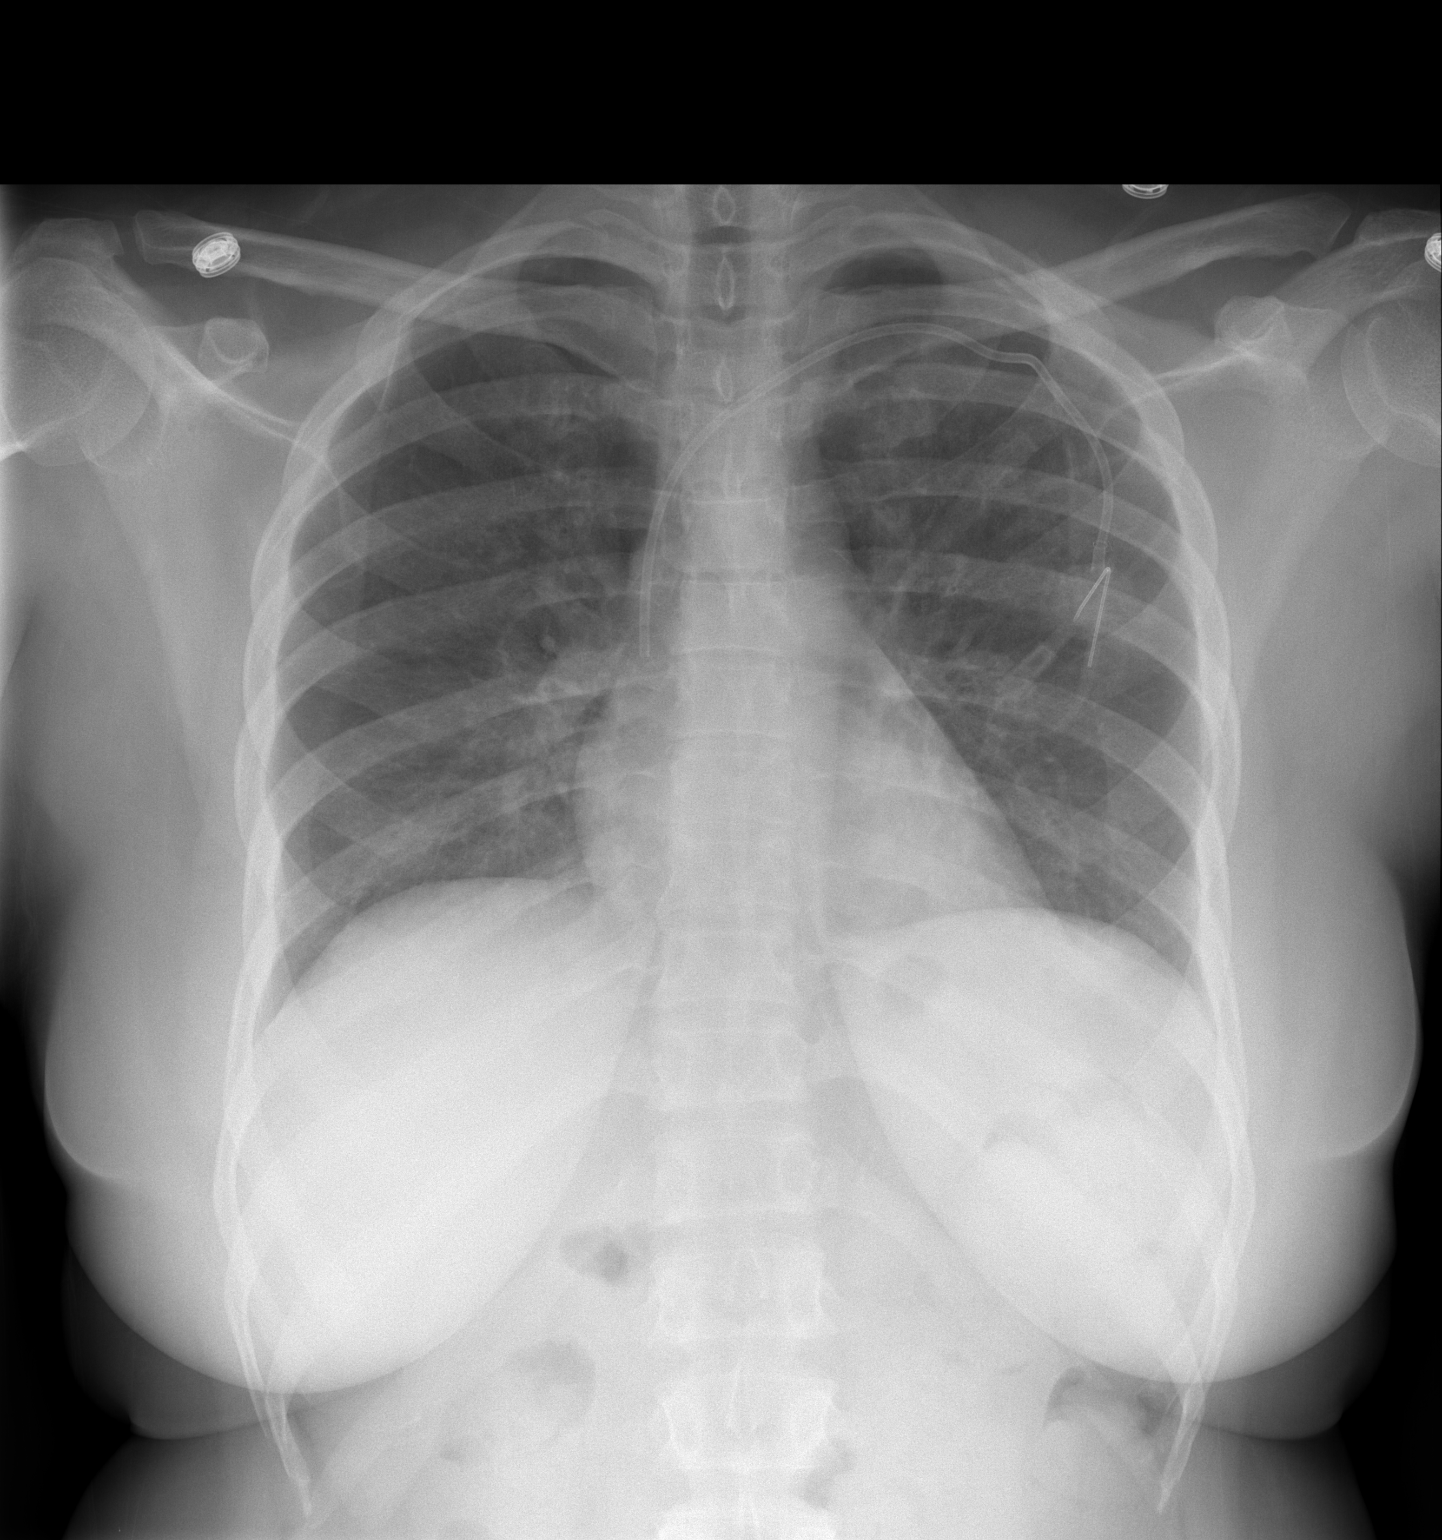

[w chest lat]
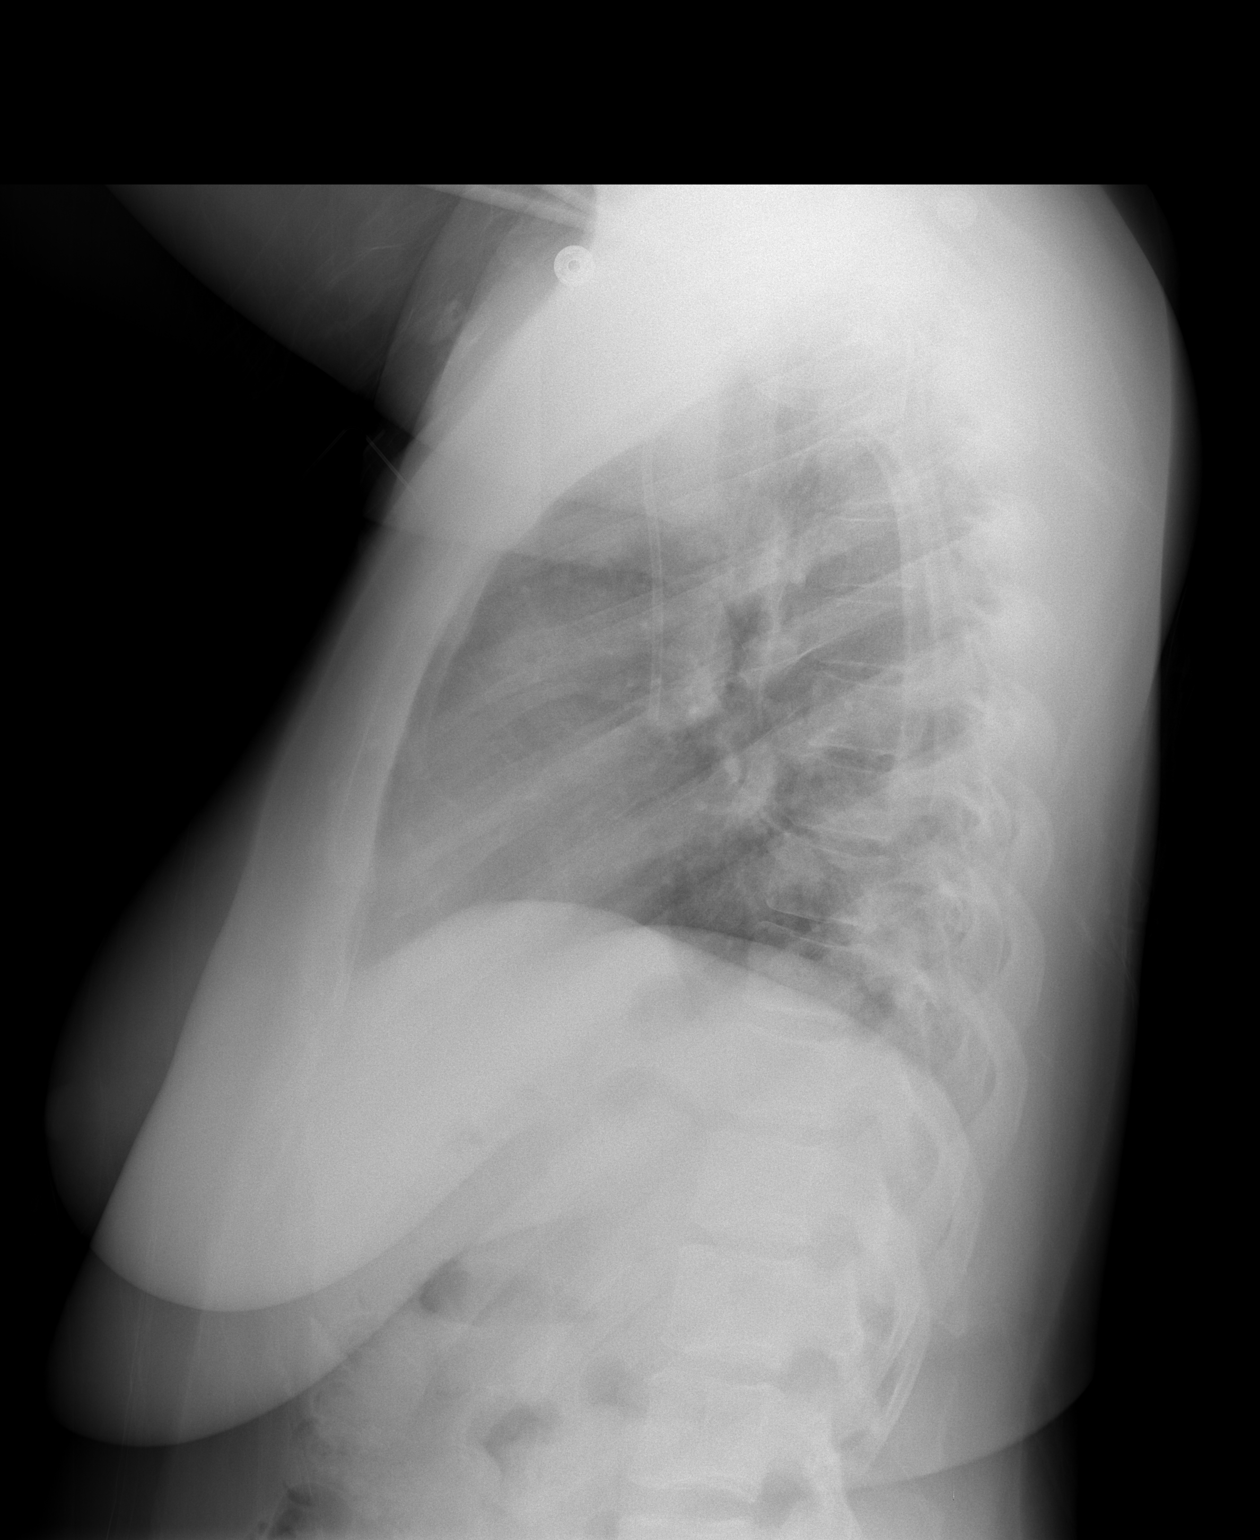

[2 of 2 positions shown; findings below may reference images not displayed]

FINDINGS: Two views of the chest show patchy opacity in the left lower lobe.  This could be atelectasis, but pneumonia is a definite consideration.  No effusion is seen.  Port-a-cath is present with the tip in the superior vena cava and no pneumothorax is noted.  Heart size is stable.
IMPRESSION: 1.  Patchy opacity in the left lower lobe suspicious for pneumonia.
 2.  Port-a-cath tip in superior vena cava.

## 2005-11-26 LAB — CBC WITH DIFFERENTIAL/PLATELET
BASO%: 1.7 % (ref 0.0–2.0)
EOS%: 1.9 % (ref 0.0–7.0)
HCT: 33.2 % — ABNORMAL LOW (ref 34.8–46.6)
LYMPH%: 45.4 % (ref 14.0–48.0)
MCH: 21.9 pg — ABNORMAL LOW (ref 26.0–34.0)
MCHC: 31 g/dL — ABNORMAL LOW (ref 32.0–36.0)
NEUT%: 41.2 % (ref 39.6–76.8)
Platelets: 283 10*3/uL (ref 145–400)

## 2005-11-30 LAB — CBC WITH DIFFERENTIAL/PLATELET
BASO%: 3.5 % — ABNORMAL HIGH (ref 0.0–2.0)
EOS%: 1.2 % (ref 0.0–7.0)
MCH: 22.4 pg — ABNORMAL LOW (ref 26.0–34.0)
MCHC: 32.3 g/dL (ref 32.0–36.0)
RDW: 14.3 % (ref 11.3–14.5)
lymph#: 1.2 10*3/uL (ref 0.9–3.3)

## 2005-12-03 LAB — CBC WITH DIFFERENTIAL/PLATELET
BASO%: 0.7 % (ref 0.0–2.0)
Basophils Absolute: 0 10*3/uL (ref 0.0–0.1)
HCT: 33 % — ABNORMAL LOW (ref 34.8–46.6)
HGB: 10 g/dL — ABNORMAL LOW (ref 11.6–15.9)
LYMPH%: 31.5 % (ref 14.0–48.0)
MCH: 21.4 pg — ABNORMAL LOW (ref 26.0–34.0)
MCHC: 30.4 g/dL — ABNORMAL LOW (ref 32.0–36.0)
MONO#: 0.4 10*3/uL (ref 0.1–0.9)
NEUT%: 56.5 % (ref 39.6–76.8)
Platelets: 314 10*3/uL (ref 145–400)

## 2005-12-03 LAB — COMPREHENSIVE METABOLIC PANEL
ALT: <8 U/L (ref 0–40)
AST: 8 U/L (ref 0–37)
Creatinine, Ser: 0.79 mg/dL (ref 0.40–1.20)
Total Bilirubin: 0.3 mg/dL (ref 0.3–1.2)

## 2005-12-10 LAB — BASIC METABOLIC PANEL
BUN: 6 mg/dL (ref 6–23)
CO2: 28 mEq/L (ref 19–32)
Calcium: 8.6 mg/dL (ref 8.4–10.5)
Creatinine, Ser: 0.86 mg/dL (ref 0.40–1.20)

## 2005-12-26 ENCOUNTER — Ambulatory Visit: Payer: Self-pay | Admitting: Hematology and Oncology

## 2006-01-07 LAB — CBC WITH DIFFERENTIAL/PLATELET
Basophils Absolute: 0 10*3/uL (ref 0.0–0.1)
EOS%: 5.8 % (ref 0.0–7.0)
HCT: 32 % — ABNORMAL LOW (ref 34.8–46.6)
HGB: 10.2 g/dL — ABNORMAL LOW (ref 11.6–15.9)
LYMPH%: 17.1 % (ref 14.0–48.0)
MCH: 22.2 pg — ABNORMAL LOW (ref 26.0–34.0)
MCV: 69.3 fL — ABNORMAL LOW (ref 81.0–101.0)
MONO%: 13.4 % — ABNORMAL HIGH (ref 0.0–13.0)
NEUT%: 63.2 % (ref 39.6–76.8)
Platelets: 237 10*3/uL (ref 145–400)
RDW: 18.1 % — ABNORMAL HIGH (ref 11.3–14.5)

## 2006-01-07 LAB — BASIC METABOLIC PANEL
BUN: 8 mg/dL (ref 6–23)
Calcium: 8.6 mg/dL (ref 8.4–10.5)
Creatinine, Ser: 0.89 mg/dL (ref 0.40–1.20)

## 2006-01-11 LAB — COMPREHENSIVE METABOLIC PANEL
ALT: <8 U/L (ref 0–35)
AST: 9 U/L (ref 0–37)
Albumin: 3.8 g/dL (ref 3.5–5.2)
Calcium: 8.9 mg/dL (ref 8.4–10.5)
Chloride: 107 mEq/L (ref 96–112)
Potassium: 3.7 mEq/L (ref 3.5–5.3)
Sodium: 140 mEq/L (ref 135–145)
Total Protein: 6.4 g/dL (ref 6.0–8.3)

## 2006-01-11 LAB — CBC WITH DIFFERENTIAL/PLATELET
BASO%: 0.6 % (ref 0.0–2.0)
Basophils Absolute: 0 10*3/uL (ref 0.0–0.1)
EOS%: 3.4 % (ref 0.0–7.0)
HGB: 10.4 g/dL — ABNORMAL LOW (ref 11.6–15.9)
MCH: 22.1 pg — ABNORMAL LOW (ref 26.0–34.0)
MCHC: 31.8 g/dL — ABNORMAL LOW (ref 32.0–36.0)
RDW: 18.4 % — ABNORMAL HIGH (ref 11.3–14.5)
lymph#: 0.4 10*3/uL — ABNORMAL LOW (ref 0.9–3.3)

## 2006-02-19 ENCOUNTER — Ambulatory Visit: Payer: Self-pay | Admitting: Hematology and Oncology

## 2006-02-22 LAB — COMPREHENSIVE METABOLIC PANEL
AST: 11 U/L (ref 0–37)
Albumin: 4.2 g/dL (ref 3.5–5.2)
Alkaline Phosphatase: 64 U/L (ref 39–117)
BUN: 11 mg/dL (ref 6–23)
Potassium: 3.9 mEq/L (ref 3.5–5.3)

## 2006-02-22 LAB — CBC WITH DIFFERENTIAL/PLATELET
Basophils Absolute: 0 10*3/uL (ref 0.0–0.1)
EOS%: 1.4 % (ref 0.0–7.0)
HGB: 11.2 g/dL — ABNORMAL LOW (ref 11.6–15.9)
MCH: 22.1 pg — ABNORMAL LOW (ref 26.0–34.0)
MCV: 69.3 fL — ABNORMAL LOW (ref 81.0–101.0)
MONO%: 10.4 % (ref 0.0–13.0)
RBC: 5.07 10*6/uL (ref 3.70–5.32)
RDW: 19.3 % — ABNORMAL HIGH (ref 11.3–14.5)

## 2006-03-22 LAB — CBC WITH DIFFERENTIAL/PLATELET
BASO%: 0.3 % (ref 0.0–2.0)
Basophils Absolute: 0 10*3/uL (ref 0.0–0.1)
HCT: 33.9 % — ABNORMAL LOW (ref 34.8–46.6)
HGB: 10.7 g/dL — ABNORMAL LOW (ref 11.6–15.9)
LYMPH%: 27 % (ref 14.0–48.0)
MCH: 22.1 pg — ABNORMAL LOW (ref 26.0–34.0)
MCHC: 31.7 g/dL — ABNORMAL LOW (ref 32.0–36.0)
MONO#: 0.4 10*3/uL (ref 0.1–0.9)
NEUT%: 57.1 % (ref 39.6–76.8)
Platelets: 252 10*3/uL (ref 145–400)
WBC: 2.7 10*3/uL — ABNORMAL LOW (ref 3.9–10.0)
lymph#: 0.7 10*3/uL — ABNORMAL LOW (ref 0.9–3.3)

## 2006-03-22 LAB — COMPREHENSIVE METABOLIC PANEL
ALT: 8 U/L (ref 0–35)
BUN: 11 mg/dL (ref 6–23)
CO2: 28 mEq/L (ref 19–32)
Calcium: 9 mg/dL (ref 8.4–10.5)
Chloride: 105 mEq/L (ref 96–112)
Creatinine, Ser: 0.77 mg/dL (ref 0.40–1.20)
Glucose, Bld: 101 mg/dL — ABNORMAL HIGH (ref 70–99)
Total Bilirubin: 0.4 mg/dL (ref 0.3–1.2)

## 2006-03-22 LAB — CEA: CEA: 1.3 ng/mL (ref 0.0–5.0)

## 2006-03-25 ENCOUNTER — Ambulatory Visit (HOSPITAL_COMMUNITY): Admission: RE | Admit: 2006-03-25 | Discharge: 2006-03-25 | Payer: Self-pay | Admitting: Hematology and Oncology

## 2006-03-25 IMAGING — CT CT CHEST W/ CM
2 of 6 series · 16 of 46 positions shown, 18 images · IV contrast (omnipaque)
Comparison: PET CT [DATE] and CT abdomen and pelvis [DATE].

CLINICAL DATA: Rectal cancer. Colon resection.
 CHEST CT WITH CONTRAST ? [DATE]:
TECHNIQUE: Multidetector CT imaging of the chest was performed following the standard protocol during bolus administration of intravenous contrast.   
 Contrast:  125 cc Omnipaque 300 IV.
TECHNIQUE: Multidetector CT imaging of the abdomen was performed following the standard protocol during bolus administration of intravenous contrast.
TECHNIQUE: Multidetector CT imaging of the pelvis was performed following the standard protocol during bolus administration of intravenous contrast.

[Series 2: cap 5.0 b40f · axial · 0.68mm/px · z∈[-531,-41]mm · 13 of 114 slices shown, 15 images]
[im 8/114  soft-tissue]
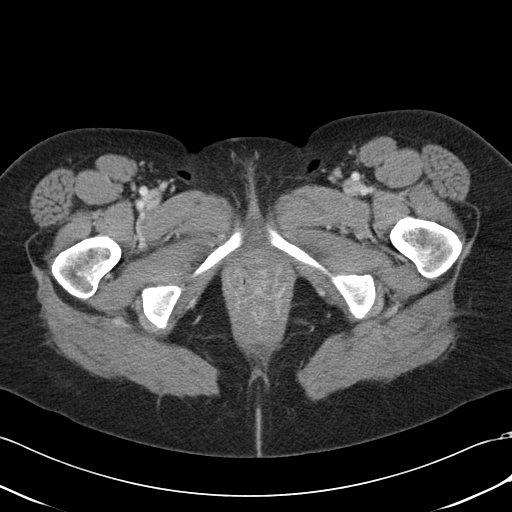
[im 8/114  bone]
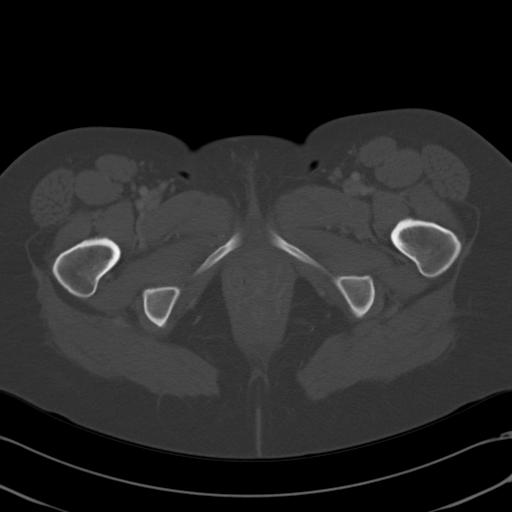
[im 15/114  soft-tissue]
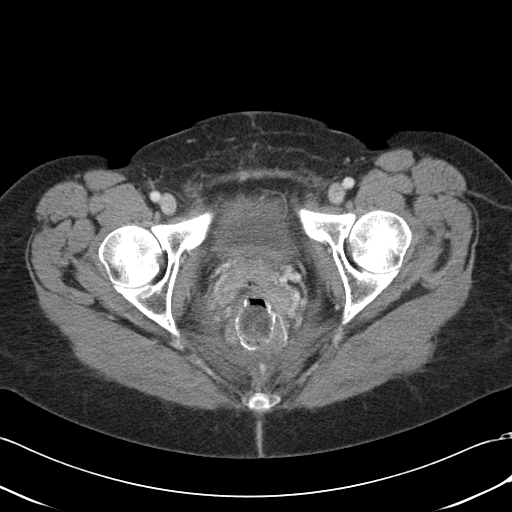
[im 22/114  soft-tissue]
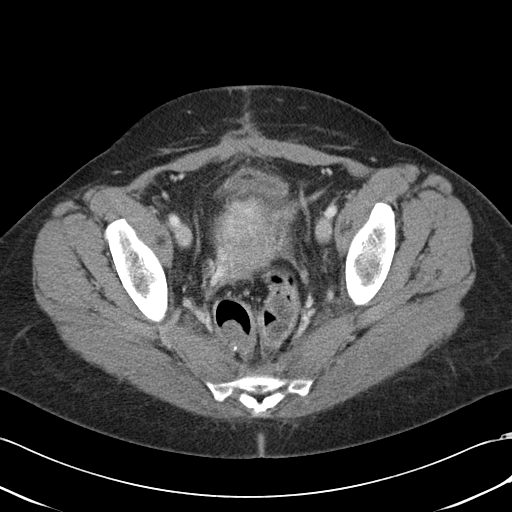
[im 36/114  soft-tissue]
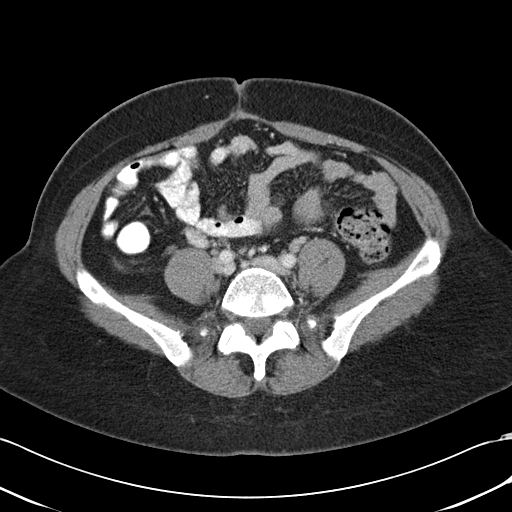
[im 43/114  soft-tissue]
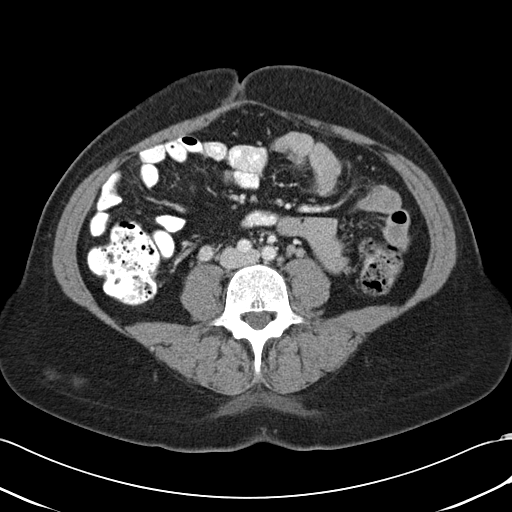
[im 50/114  soft-tissue]
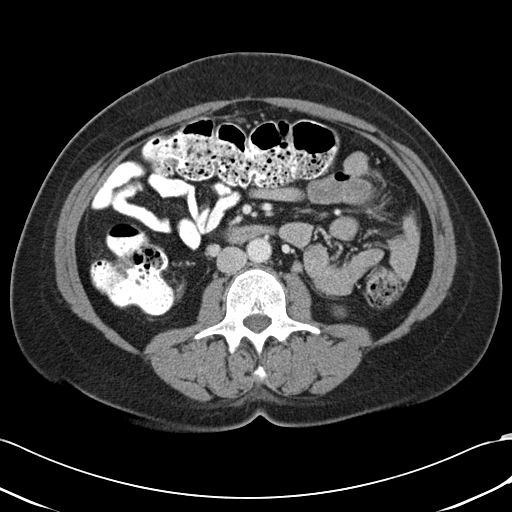
[im 57/114  soft-tissue]
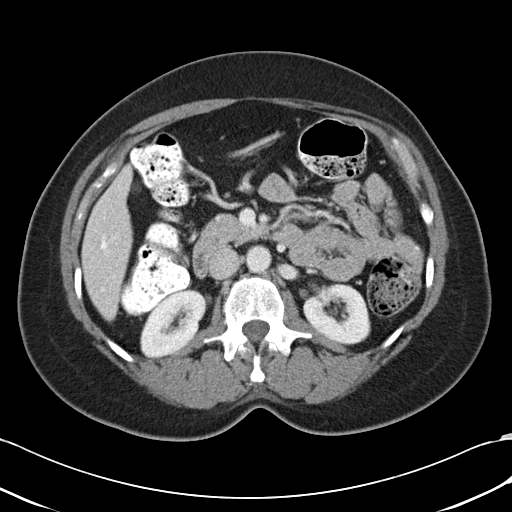
[im 64/114  soft-tissue]
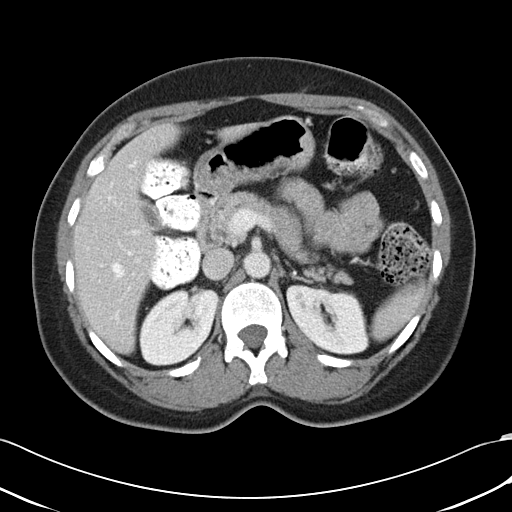
[im 71/114  soft-tissue]
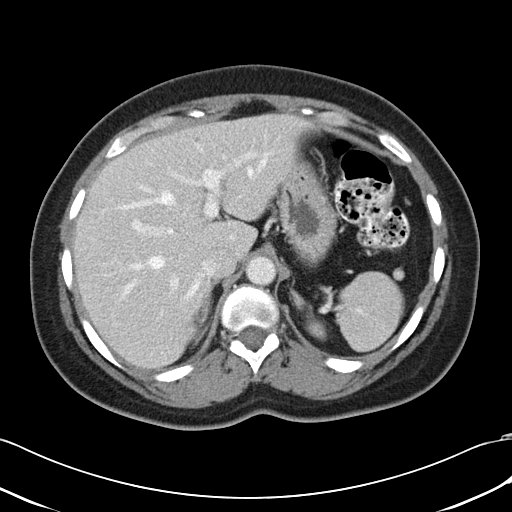
[im 71/114  bone]
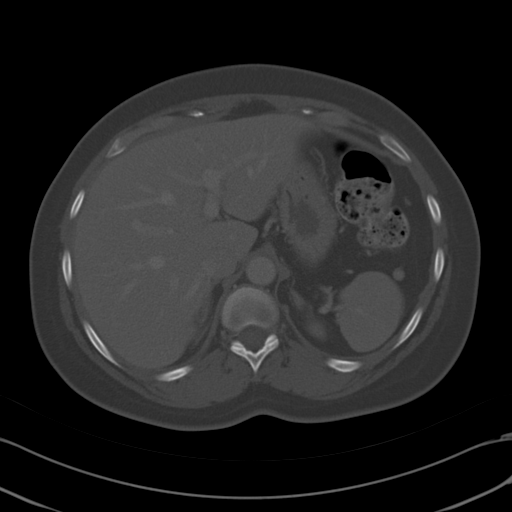
[im 78/114  soft-tissue]
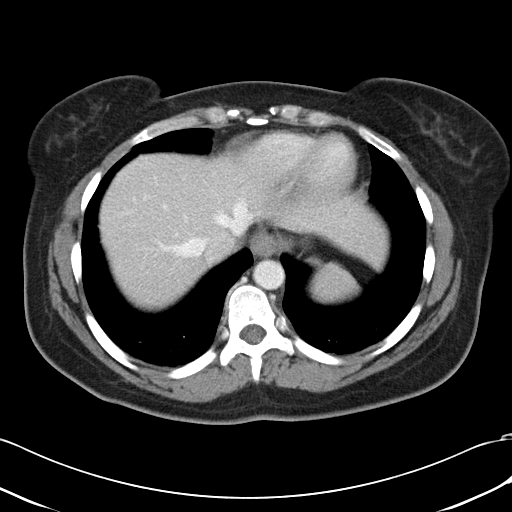
[im 92/114  soft-tissue]
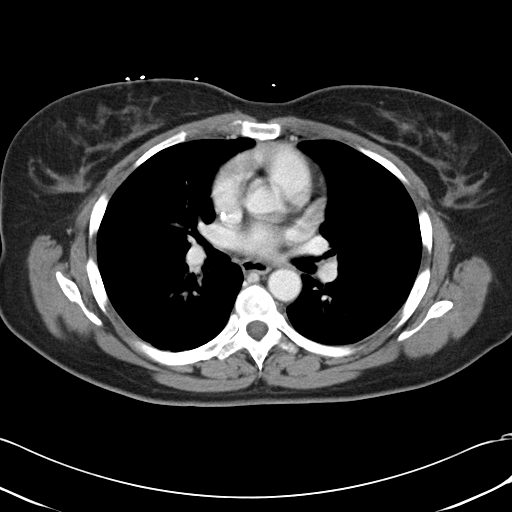
[im 99/114  soft-tissue]
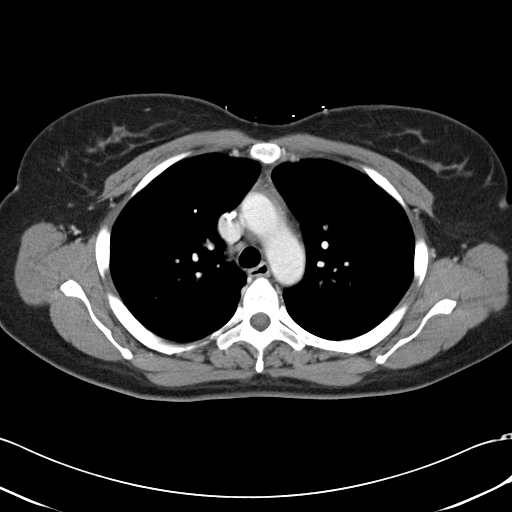
[im 106/114  soft-tissue]
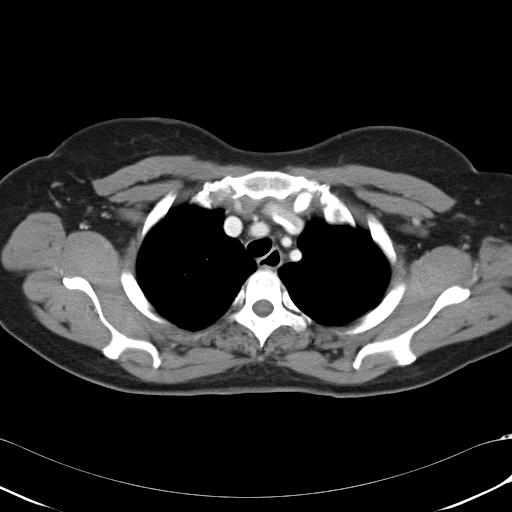

[Series 602: <mpr thick range> · coronal · 1.11mm/px · 3 of 65 slices shown]
[im 22/65  soft-tissue]
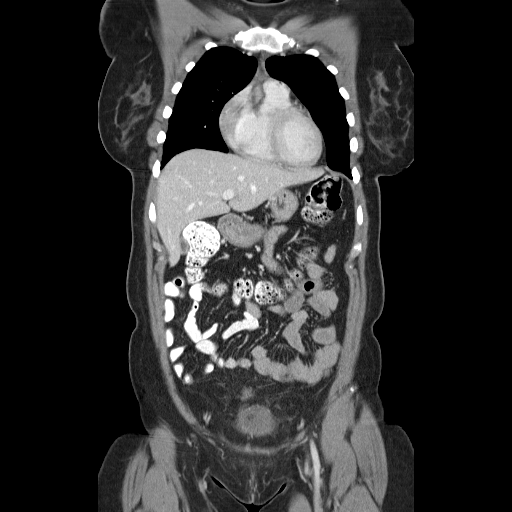
[im 29/65  soft-tissue]
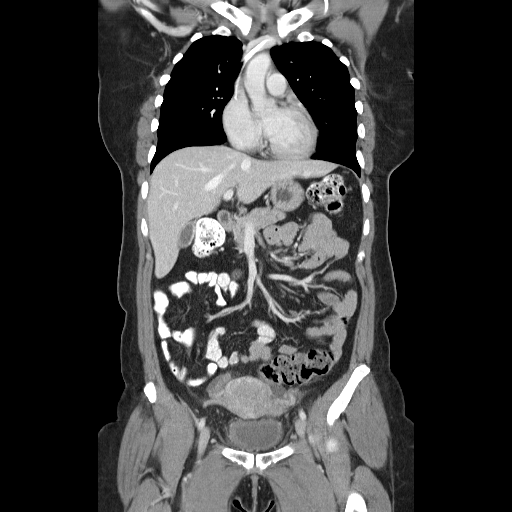
[im 36/65  soft-tissue]
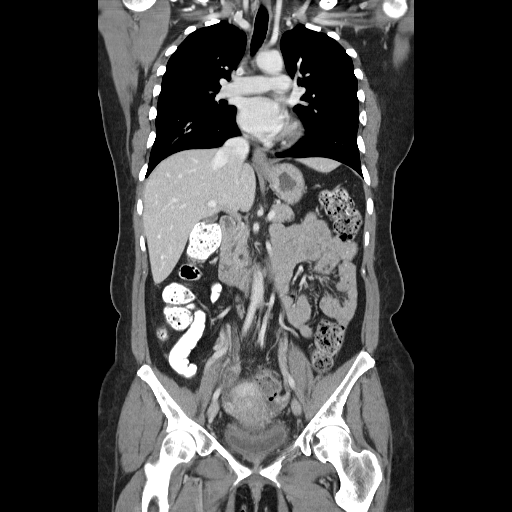

[16 of 46 positions shown; findings below may reference images not displayed]

FINDINGS: Mild thymic rebound is seen in the anterior mediastinum.  No pathologically enlarged mediastinal, hilar, or axillary lymph nodes.  The heart size is normal.   No pericardial fluid.  There is mild thickening of the distal esophagus.
 No pleural fluid.  Minimal bibasilar subsegmental atelectasis.  The lungs are otherwise clear.   The airway is unremarkable.
IMPRESSION: No evidence of metastatic disease.
 ABDOMEN CT WITH CONTRAST ? [DATE]:
FINDINGS: Tiny low-attenuation lesions in the liver are too small to characterize.  The liver, gallbladder, adrenal glands, kidneys, spleen, pancreas, stomach, and small bowel are otherwise unremarkable.  There are a few nonenlarged lymph nodes in the ileocolic mesentery.  No pathologically enlarged lymph nodes.
 Postoperative changes are seen in the anterior abdominal wall.
IMPRESSION: No evidence of metastatic disease.
 PELVIS CT WITH CONTRAST ? [DATE]:
FINDINGS: Postoperative changes are seen in the rectum. There is presacral edema.  The remainder of the colon is unremarkable.  The ovaries and uterus are visualized.  There may be a trace amount of free fluid in the cul-de-sac.   The bladder wall is mildly thickened.  Postoperative changes are seen in the anterior pelvic wall.  External iliac lymph nodes are small in size.  No pathologically enlarged lymph nodes.  No worrisome lytic or sclerotic lesions.
IMPRESSION: Postoperative changes in the rectum.  Presacral edema is presumably postsurgical in etiology.  Attention on follow-up exam is warranted.

## 2006-04-04 ENCOUNTER — Ambulatory Visit (HOSPITAL_COMMUNITY): Admission: RE | Admit: 2006-04-04 | Discharge: 2006-04-04 | Payer: Self-pay | Admitting: Gastroenterology

## 2006-05-09 ENCOUNTER — Ambulatory Visit: Payer: Self-pay | Admitting: Hematology and Oncology

## 2006-05-21 LAB — CBC WITH DIFFERENTIAL/PLATELET
Basophils Absolute: 0 10*3/uL (ref 0.0–0.1)
EOS%: 2.2 % (ref 0.0–7.0)
Eosinophils Absolute: 0.1 10*3/uL (ref 0.0–0.5)
HCT: 35.3 % (ref 34.8–46.6)
HGB: 11.6 g/dL (ref 11.6–15.9)
MONO#: 0.4 10*3/uL (ref 0.1–0.9)
NEUT#: 1.9 10*3/uL (ref 1.5–6.5)
NEUT%: 62 % (ref 39.6–76.8)
RDW: 15.4 % — ABNORMAL HIGH (ref 11.3–14.5)
WBC: 3 10*3/uL — ABNORMAL LOW (ref 3.9–10.0)
lymph#: 0.7 10*3/uL — ABNORMAL LOW (ref 0.9–3.3)

## 2006-05-21 LAB — COMPREHENSIVE METABOLIC PANEL
AST: 12 U/L (ref 0–37)
Albumin: 4 g/dL (ref 3.5–5.2)
BUN: 13 mg/dL (ref 6–23)
CO2: 25 mEq/L (ref 19–32)
Calcium: 9.2 mg/dL (ref 8.4–10.5)
Chloride: 108 mEq/L (ref 96–112)
Creatinine, Ser: 0.91 mg/dL (ref 0.40–1.20)
Potassium: 3.9 mEq/L (ref 3.5–5.3)

## 2006-06-21 ENCOUNTER — Ambulatory Visit: Payer: Self-pay | Admitting: Hematology and Oncology

## 2006-06-26 LAB — CBC WITH DIFFERENTIAL/PLATELET
BASO%: 0.5 % (ref 0.0–2.0)
Basophils Absolute: 0 10*3/uL (ref 0.0–0.1)
EOS%: 1.4 % (ref 0.0–7.0)
HCT: 34.9 % (ref 34.8–46.6)
HGB: 11.4 g/dL — ABNORMAL LOW (ref 11.6–15.9)
MCH: 22.8 pg — ABNORMAL LOW (ref 26.0–34.0)
MCHC: 32.8 g/dL (ref 32.0–36.0)
MONO#: 0.4 10*3/uL (ref 0.1–0.9)
RDW: 16.1 % — ABNORMAL HIGH (ref 11.3–14.5)
WBC: 2.3 10*3/uL — ABNORMAL LOW (ref 3.9–10.0)
lymph#: 0.7 10*3/uL — ABNORMAL LOW (ref 0.9–3.3)

## 2006-06-26 LAB — COMPREHENSIVE METABOLIC PANEL
AST: 13 U/L (ref 0–37)
Alkaline Phosphatase: 73 U/L (ref 39–117)
Glucose, Bld: 97 mg/dL (ref 70–99)
Sodium: 142 mEq/L (ref 135–145)
Total Bilirubin: 0.3 mg/dL (ref 0.3–1.2)
Total Protein: 6.4 g/dL (ref 6.0–8.3)

## 2006-08-08 ENCOUNTER — Ambulatory Visit: Payer: Self-pay | Admitting: Hematology and Oncology

## 2006-08-12 LAB — COMPREHENSIVE METABOLIC PANEL
Alkaline Phosphatase: 79 U/L (ref 39–117)
BUN: 13 mg/dL (ref 6–23)
Glucose, Bld: 92 mg/dL (ref 70–99)
Total Bilirubin: 0.3 mg/dL (ref 0.3–1.2)

## 2006-08-12 LAB — CBC WITH DIFFERENTIAL/PLATELET
Basophils Absolute: 0 10*3/uL (ref 0.0–0.1)
EOS%: 2.8 % (ref 0.0–7.0)
Eosinophils Absolute: 0.1 10*3/uL (ref 0.0–0.5)
LYMPH%: 24.3 % (ref 14.0–48.0)
MCH: 23 pg — ABNORMAL LOW (ref 26.0–34.0)
MCV: 69.6 fL — ABNORMAL LOW (ref 81.0–101.0)
MONO%: 13.4 % — ABNORMAL HIGH (ref 0.0–13.0)
NEUT#: 1.7 10*3/uL (ref 1.5–6.5)
Platelets: 237 10*3/uL (ref 145–400)
RBC: 4.86 10*6/uL (ref 3.70–5.32)

## 2006-08-12 LAB — URINALYSIS, MICROSCOPIC - CHCC
Bilirubin (Urine): NEGATIVE
Ketones: NEGATIVE mg/dL
Specific Gravity, Urine: 1.03 (ref 1.003–1.035)
pH: 6 (ref 4.6–8.0)

## 2006-08-12 LAB — CEA: CEA: 1.1 ng/mL (ref 0.0–5.0)

## 2006-08-14 LAB — URINE CULTURE

## 2006-08-29 LAB — CBC WITH DIFFERENTIAL/PLATELET
Basophils Absolute: 0 10*3/uL (ref 0.0–0.1)
EOS%: 3 % (ref 0.0–7.0)
Eosinophils Absolute: 0.1 10*3/uL (ref 0.0–0.5)
HGB: 11.4 g/dL — ABNORMAL LOW (ref 11.6–15.9)
MCH: 23.1 pg — ABNORMAL LOW (ref 26.0–34.0)
NEUT#: 1.3 10*3/uL — ABNORMAL LOW (ref 1.5–6.5)
RDW: 16.2 % — ABNORMAL HIGH (ref 11.3–14.5)
lymph#: 1 10*3/uL (ref 0.9–3.3)

## 2006-08-29 LAB — COMPREHENSIVE METABOLIC PANEL
AST: 13 U/L (ref 0–37)
Albumin: 4 g/dL (ref 3.5–5.2)
BUN: 13 mg/dL (ref 6–23)
Calcium: 9.2 mg/dL (ref 8.4–10.5)
Chloride: 103 mEq/L (ref 96–112)
Potassium: 4.1 mEq/L (ref 3.5–5.3)
Total Protein: 6.5 g/dL (ref 6.0–8.3)

## 2006-09-16 LAB — CBC WITH DIFFERENTIAL/PLATELET
BASO%: 0.1 % (ref 0.0–2.0)
EOS%: 1.9 % (ref 0.0–7.0)
Eosinophils Absolute: 0.1 10*3/uL (ref 0.0–0.5)
MCH: 23.6 pg — ABNORMAL LOW (ref 26.0–34.0)
MCHC: 33.3 g/dL (ref 32.0–36.0)
MCV: 70.8 fL — ABNORMAL LOW (ref 81.0–101.0)
MONO%: 13.7 % — ABNORMAL HIGH (ref 0.0–13.0)
NEUT#: 1.8 10*3/uL (ref 1.5–6.5)
RBC: 4.59 10*6/uL (ref 3.70–5.32)
RDW: 17.3 % — ABNORMAL HIGH (ref 11.3–14.5)

## 2006-09-19 ENCOUNTER — Ambulatory Visit: Payer: Self-pay | Admitting: Hematology and Oncology

## 2006-09-23 LAB — COMPREHENSIVE METABOLIC PANEL
Albumin: 4.2 g/dL (ref 3.5–5.2)
Alkaline Phosphatase: 86 U/L (ref 39–117)
BUN: 14 mg/dL (ref 6–23)
CO2: 22 mEq/L (ref 19–32)
Glucose, Bld: 121 mg/dL — ABNORMAL HIGH (ref 70–99)
Total Bilirubin: 0.4 mg/dL (ref 0.3–1.2)

## 2006-09-23 LAB — CBC WITH DIFFERENTIAL/PLATELET
Basophils Absolute: 0 10*3/uL (ref 0.0–0.1)
Eosinophils Absolute: 0.1 10*3/uL (ref 0.0–0.5)
HCT: 35.4 % (ref 34.8–46.6)
HGB: 11.8 g/dL (ref 11.6–15.9)
LYMPH%: 29.1 % (ref 14.0–48.0)
MCV: 71.3 fL — ABNORMAL LOW (ref 81.0–101.0)
MONO#: 0.3 10*3/uL (ref 0.1–0.9)
MONO%: 9.5 % (ref 0.0–13.0)
NEUT#: 1.8 10*3/uL (ref 1.5–6.5)
Platelets: 243 10*3/uL (ref 145–400)

## 2006-09-23 LAB — LACTATE DEHYDROGENASE: LDH: 181 U/L (ref 94–250)

## 2006-10-08 LAB — CBC WITH DIFFERENTIAL/PLATELET
BASO%: 0.2 % (ref 0.0–2.0)
Eosinophils Absolute: 0.1 10*3/uL (ref 0.0–0.5)
HCT: 33.9 % — ABNORMAL LOW (ref 34.8–46.6)
LYMPH%: 33 % (ref 14.0–48.0)
MCHC: 32.9 g/dL (ref 32.0–36.0)
MONO#: 0.4 10*3/uL (ref 0.1–0.9)
NEUT#: 1.5 10*3/uL (ref 1.5–6.5)
Platelets: 215 10*3/uL (ref 145–400)
RBC: 4.7 10*6/uL (ref 3.70–5.32)
WBC: 3 10*3/uL — ABNORMAL LOW (ref 3.9–10.0)
lymph#: 1 10*3/uL (ref 0.9–3.3)

## 2006-10-21 LAB — CBC WITH DIFFERENTIAL/PLATELET
BASO%: 0.6 % (ref 0.0–2.0)
Basophils Absolute: 0 10*3/uL (ref 0.0–0.1)
Eosinophils Absolute: 0.1 10*3/uL (ref 0.0–0.5)
HCT: 33.7 % — ABNORMAL LOW (ref 34.8–46.6)
HGB: 11.1 g/dL — ABNORMAL LOW (ref 11.6–15.9)
LYMPH%: 28.9 % (ref 14.0–48.0)
MCHC: 33 g/dL (ref 32.0–36.0)
MONO#: 0.5 10*3/uL (ref 0.1–0.9)
NEUT%: 51.5 % (ref 39.6–76.8)
Platelets: 195 10*3/uL (ref 145–400)
WBC: 3.2 10*3/uL — ABNORMAL LOW (ref 3.9–10.0)
lymph#: 0.9 10*3/uL (ref 0.9–3.3)

## 2006-10-31 ENCOUNTER — Ambulatory Visit: Payer: Self-pay | Admitting: Hematology and Oncology

## 2006-11-05 LAB — COMPREHENSIVE METABOLIC PANEL
ALT: 11 U/L (ref 0–35)
Albumin: 3.9 g/dL (ref 3.5–5.2)
Alkaline Phosphatase: 77 U/L (ref 39–117)
CO2: 22 mEq/L (ref 19–32)
Glucose, Bld: 105 mg/dL — ABNORMAL HIGH (ref 70–99)
Potassium: 4 mEq/L (ref 3.5–5.3)
Sodium: 142 mEq/L (ref 135–145)
Total Protein: 6.4 g/dL (ref 6.0–8.3)

## 2006-11-05 LAB — CBC WITH DIFFERENTIAL/PLATELET
BASO%: 0.2 % (ref 0.0–2.0)
Eosinophils Absolute: 0.1 10*3/uL (ref 0.0–0.5)
MONO#: 0.2 10*3/uL (ref 0.1–0.9)
MONO%: 7.1 % (ref 0.0–13.0)
NEUT#: 1.9 10*3/uL (ref 1.5–6.5)
RBC: 4.98 10*6/uL (ref 3.70–5.32)
RDW: 17.5 % — ABNORMAL HIGH (ref 11.3–14.5)
WBC: 3 10*3/uL — ABNORMAL LOW (ref 3.9–10.0)

## 2006-12-13 ENCOUNTER — Ambulatory Visit: Payer: Self-pay | Admitting: Hematology and Oncology

## 2007-01-16 LAB — COMPREHENSIVE METABOLIC PANEL
ALT: 8 U/L (ref 0–35)
AST: 11 U/L (ref 0–37)
Alkaline Phosphatase: 84 U/L (ref 39–117)
CO2: 23 mEq/L (ref 19–32)
Creatinine, Ser: 0.97 mg/dL (ref 0.40–1.20)
Sodium: 139 mEq/L (ref 135–145)
Total Bilirubin: 0.3 mg/dL (ref 0.3–1.2)
Total Protein: 6.6 g/dL (ref 6.0–8.3)

## 2007-01-16 LAB — CBC WITH DIFFERENTIAL/PLATELET
BASO%: 0.1 % (ref 0.0–2.0)
EOS%: 2.5 % (ref 0.0–7.0)
LYMPH%: 26.6 % (ref 14.0–48.0)
MCH: 23.5 pg — ABNORMAL LOW (ref 26.0–34.0)
MCHC: 32.9 g/dL (ref 32.0–36.0)
MONO#: 0.4 10*3/uL (ref 0.1–0.9)
MONO%: 14.3 % — ABNORMAL HIGH (ref 0.0–13.0)
NEUT%: 56.5 % (ref 39.6–76.8)
Platelets: 248 10*3/uL (ref 145–400)
RBC: 5.03 10*6/uL (ref 3.70–5.32)
WBC: 3 10*3/uL — ABNORMAL LOW (ref 3.9–10.0)

## 2007-01-20 ENCOUNTER — Ambulatory Visit (HOSPITAL_COMMUNITY): Admission: RE | Admit: 2007-01-20 | Discharge: 2007-01-20 | Payer: Self-pay | Admitting: Hematology and Oncology

## 2007-01-20 IMAGING — CT CT PELVIS W/ CM
2 of 5 series · 17 of 46 positions shown, 19 images · IV contrast (omnipaque)
Comparison: [DATE]. CT PET [DATE].

CLINICAL DATA: rectal cancer diagnosed 07-07. Chemotherapy finished 8-08.
Radiation therapy finished 11-07.
TECHNIQUE: Multidetector CT imaging of the chest, abdomen, and pelvis was
performed following the standard protocol during bolus administration of
intravenous contrast.

Contrast:  100 cc Omnipaque 300
CHEST CT WITH CONTRAST

[Series 2: cap 5.0 b40f · axial · 0.68mm/px · z∈[-528,-28]mm · 14 of 114 slices shown, 16 images]
[im 7/114  soft-tissue]
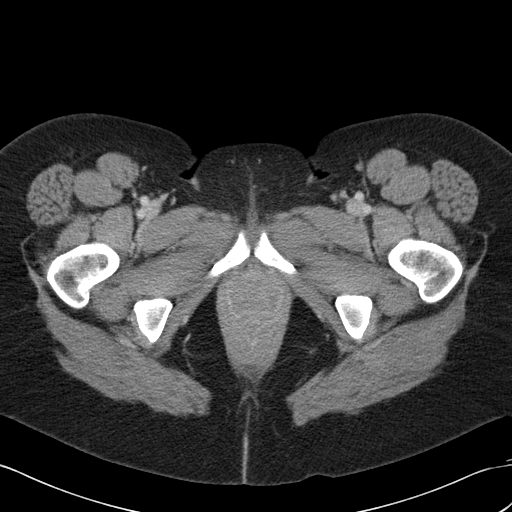
[im 7/114  bone]
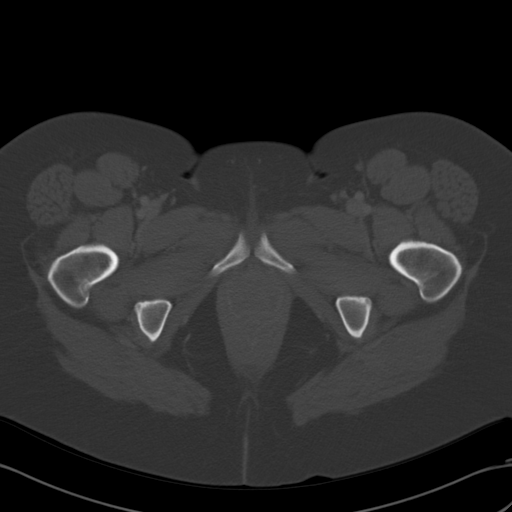
[im 13/114  soft-tissue]
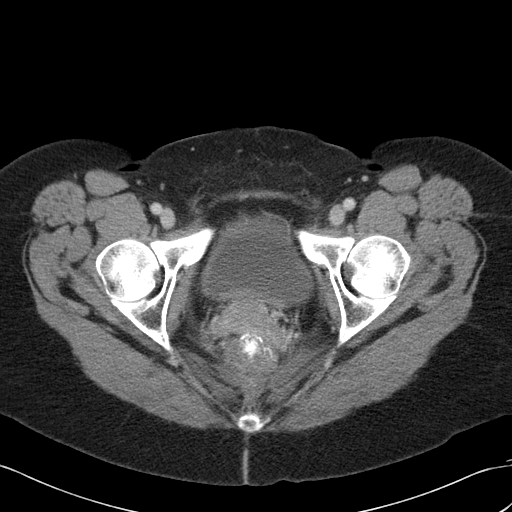
[im 26/114  soft-tissue]
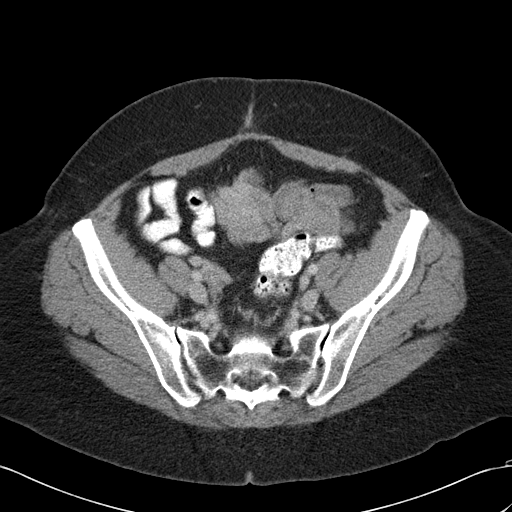
[im 32/114  soft-tissue]
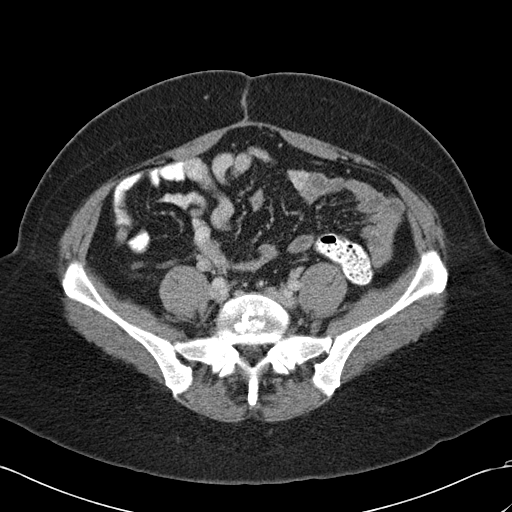
[im 38/114  soft-tissue]
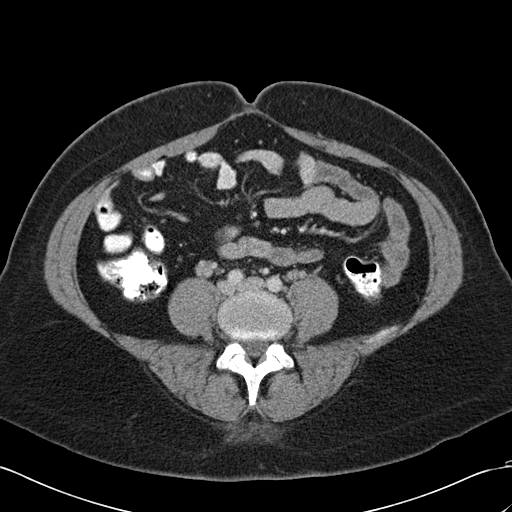
[im 44/114  soft-tissue]
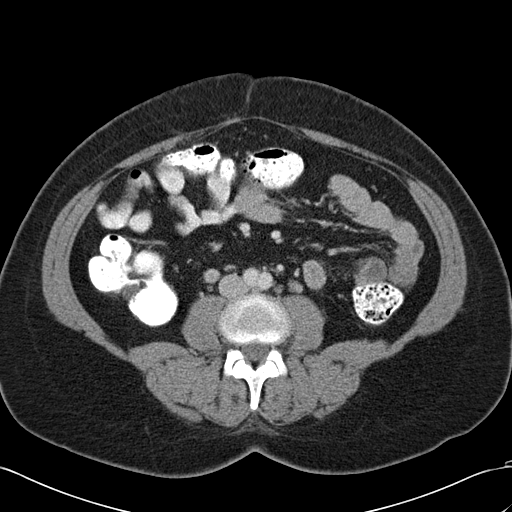
[im 51/114  soft-tissue]
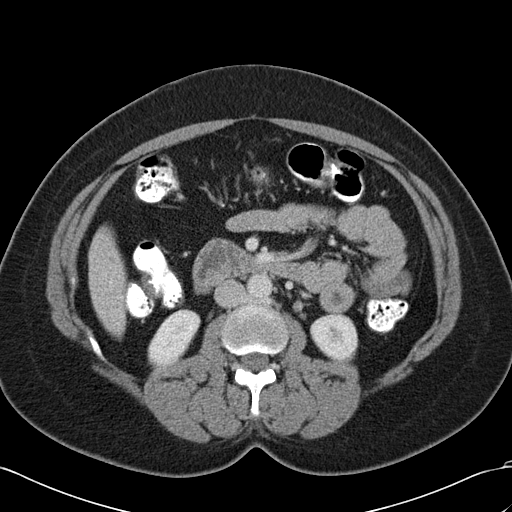
[im 63/114  soft-tissue]
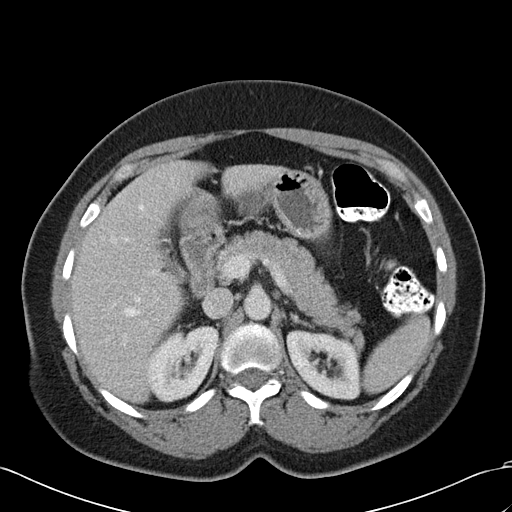
[im 70/114  soft-tissue]
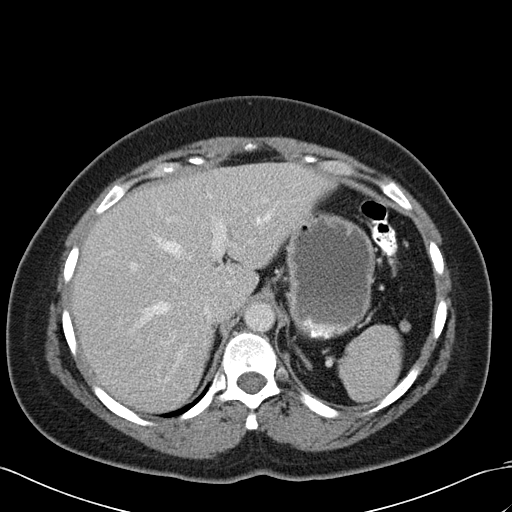
[im 70/114  bone]
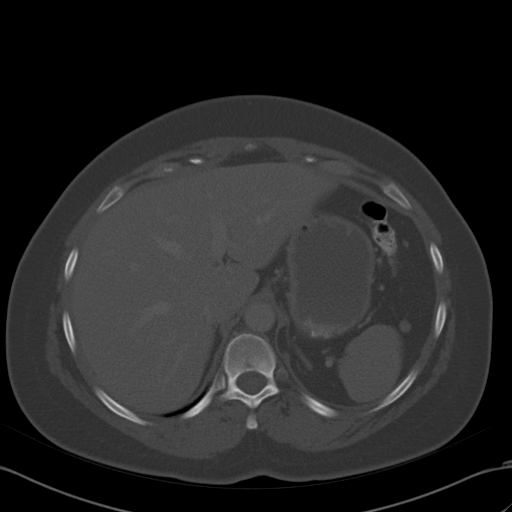
[im 76/114  soft-tissue]
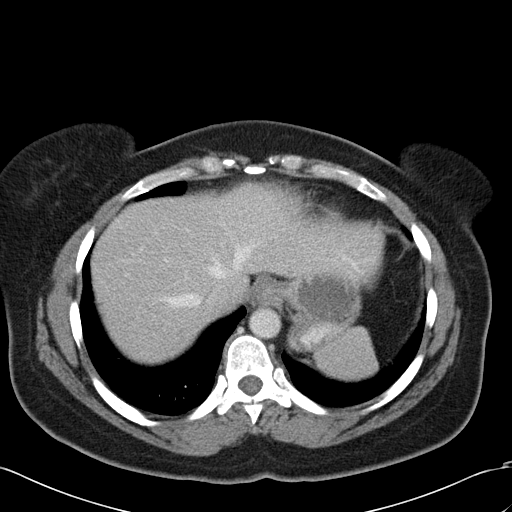
[im 82/114  soft-tissue]
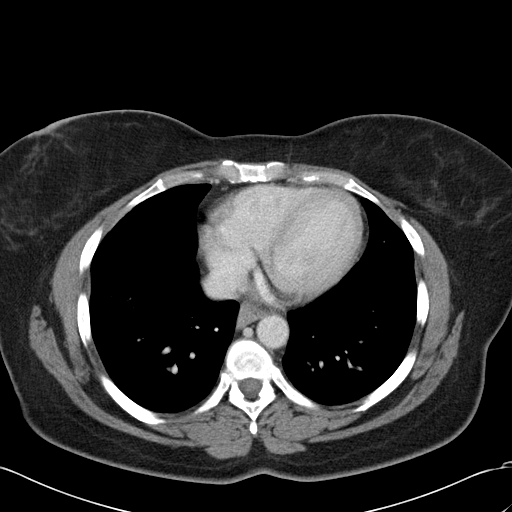
[im 88/114  soft-tissue]
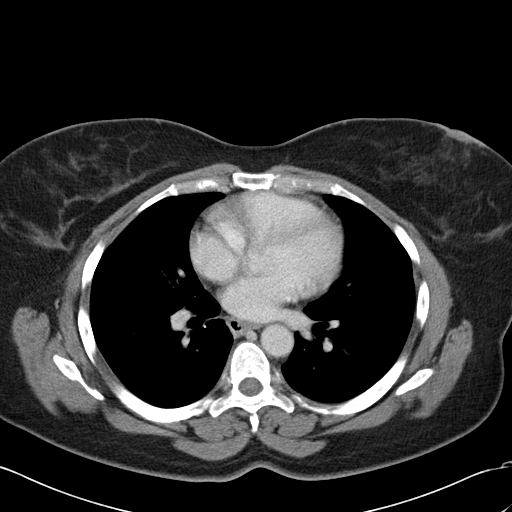
[im 101/114  soft-tissue]
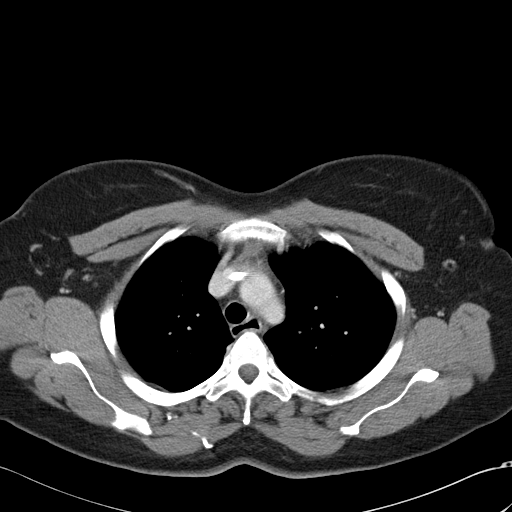
[im 107/114  soft-tissue]
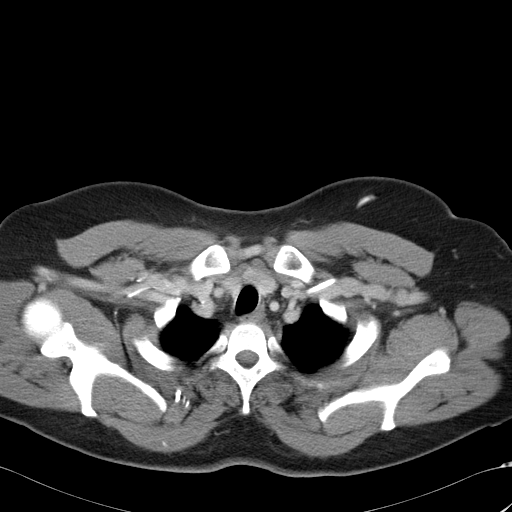

[Series 602: <mpr thick range> · coronal · 1.11mm/px · 3 of 74 slices shown]
[im 25/74  soft-tissue]
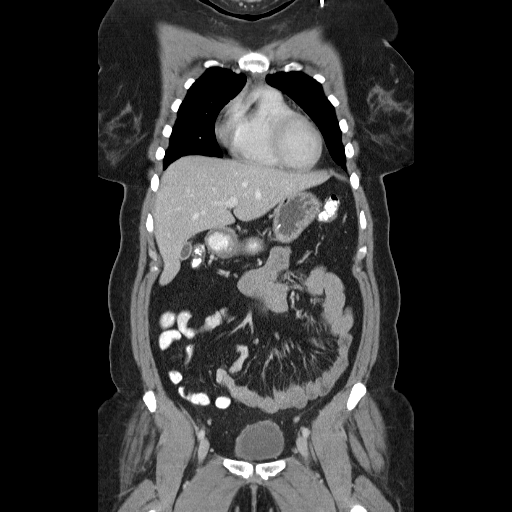
[im 33/74  soft-tissue]
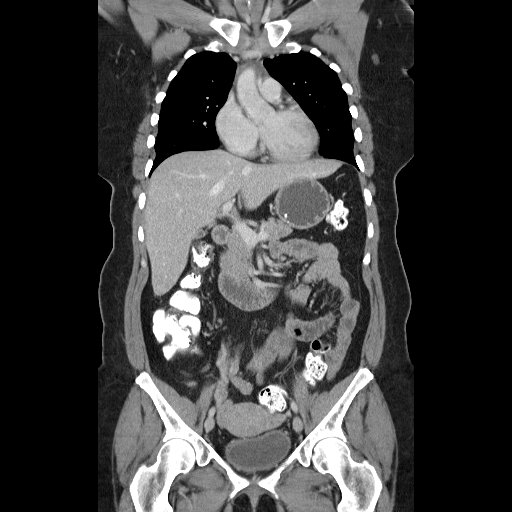
[im 41/74  soft-tissue]
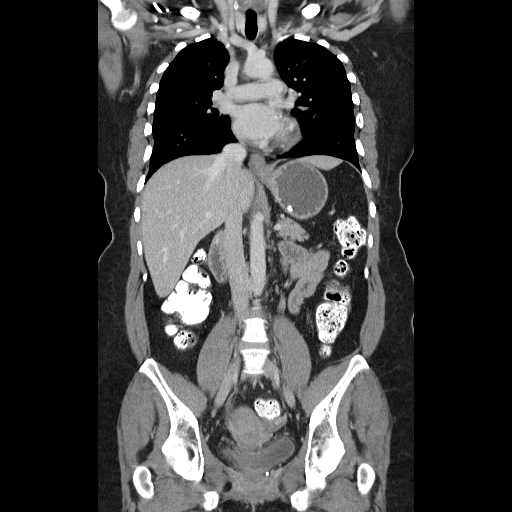

[17 of 46 positions shown; findings below may reference images not displayed]

FINDINGS: Lung windows demonstrate no nodules. No airspace opacities.

Soft tissue windows demonstrate left-sided Port-A-Cath which terminates at the
superior caval/atrial junction.

Normal heart size no pericardial or pleural effusion. No mediastinal or hilar
adenopathy. Question esophageal thickening distally on image 36. Anterior
mediastinal soft tissue is likely residual thymus.

IMPRESSION

1. No evidence of metastatic disease.
2. Question distal esophagitis.
3. Probable anterior mediastinal residual thymic tissue. Attention on followup.

ABDOMEN CT WITH CONTRAST
FINDINGS: Normal liver. Splenule. Normal stomach, pancreas, adrenal glands, and
kidneys. Normal gallbladder biliary tree.

Retroaortic left renal vein is a normal variant. No retroperitoneal or
retrocrural adenopathy.

Normal colon and appendix. Normal abdominal small bowel loops without ascites.

IMPRESSION

1. No evidence of abdominal metastasis or acute process in the abdomen.

PELVIS CT WITH CONTRAST
FINDINGS: Surgical change at the rectum. Similar to slight decrease in
surrounding fascial thickening, likely postoperative. No abnormal enhancement to
suggest local recurrence. Normal pelvic small bowel loops. Small nodes along the
left common iliac vasculature (image 72) are similar. No pelvic adenopathy.
Normal urinary bladder, uterus, and adnexa. Normal bones.

IMPRESSION

1. Stable postsurgical changes at the rectum without acute process or evidence
of metastatic disease.

## 2007-03-05 ENCOUNTER — Ambulatory Visit: Payer: Self-pay | Admitting: Hematology and Oncology

## 2007-04-16 ENCOUNTER — Ambulatory Visit: Payer: Self-pay | Admitting: Hematology and Oncology

## 2007-04-18 LAB — COMPREHENSIVE METABOLIC PANEL
AST: 13 U/L (ref 0–37)
Albumin: 4.1 g/dL (ref 3.5–5.2)
Alkaline Phosphatase: 86 U/L (ref 39–117)
Glucose, Bld: 87 mg/dL (ref 70–99)
Potassium: 4.1 mEq/L (ref 3.5–5.3)
Sodium: 139 mEq/L (ref 135–145)
Total Bilirubin: 0.3 mg/dL (ref 0.3–1.2)
Total Protein: 6.6 g/dL (ref 6.0–8.3)

## 2007-04-18 LAB — CBC WITH DIFFERENTIAL/PLATELET
BASO%: 0.2 % (ref 0.0–2.0)
EOS%: 2.2 % (ref 0.0–7.0)
HGB: 11.6 g/dL (ref 11.6–15.9)
MCH: 23.2 pg — ABNORMAL LOW (ref 26.0–34.0)
MCHC: 33.4 g/dL (ref 32.0–36.0)
MCV: 69.4 fL — ABNORMAL LOW (ref 81.0–101.0)
MONO%: 12.5 % (ref 0.0–13.0)
RBC: 5.02 10*6/uL (ref 3.70–5.32)
RDW: 14.8 % — ABNORMAL HIGH (ref 11.3–14.5)
lymph#: 1 10*3/uL (ref 0.9–3.3)

## 2007-04-18 LAB — TECHNOLOGIST REVIEW

## 2007-06-02 ENCOUNTER — Ambulatory Visit: Payer: Self-pay | Admitting: Hematology and Oncology

## 2007-07-04 ENCOUNTER — Encounter: Admission: RE | Admit: 2007-07-04 | Discharge: 2007-07-04 | Payer: Self-pay | Admitting: Gastroenterology

## 2007-07-04 IMAGING — CT CT PELVIS W/ CM
2 of 5 series · 17 of 46 positions shown, 19 images · IV contrast (READICAT/WATER & [ID] OMNI 300)
Comparison: [DATE]

CT ABDOMEN

CLINICAL DATA: Colon cancer

CT ABDOMEN AND PELVIS WITH CONTRAST
TECHNIQUE: Multidetector CT imaging of the abdomen and pelvis was
performed using the standard protocol following bolus
administration of intravenous contrast.
Contrast: 125 omni 300

[Series 3: routine abdomen · axial · 0.73mm/px · z∈[-351,-21]mm · 14 of 74 slices shown, 16 images]
[im 5/74  soft-tissue]
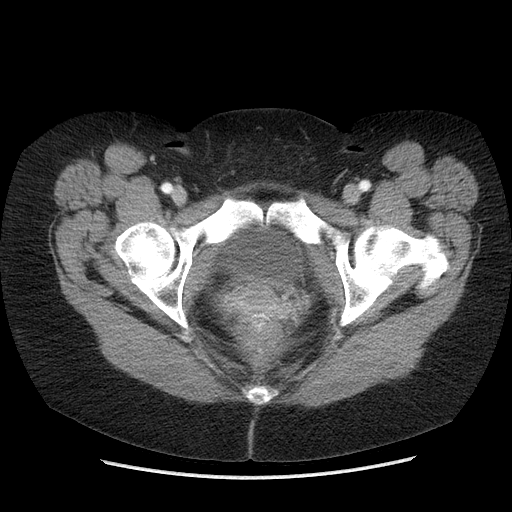
[im 5/74  bone]
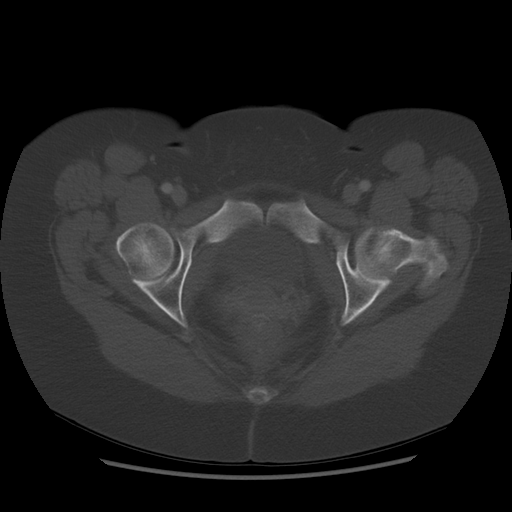
[im 9/74  soft-tissue]
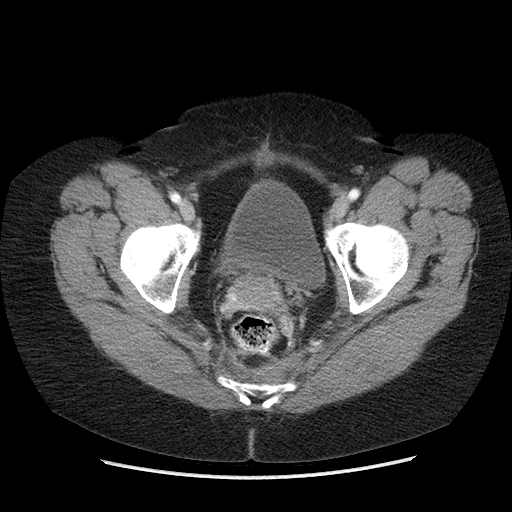
[im 17/74  soft-tissue]
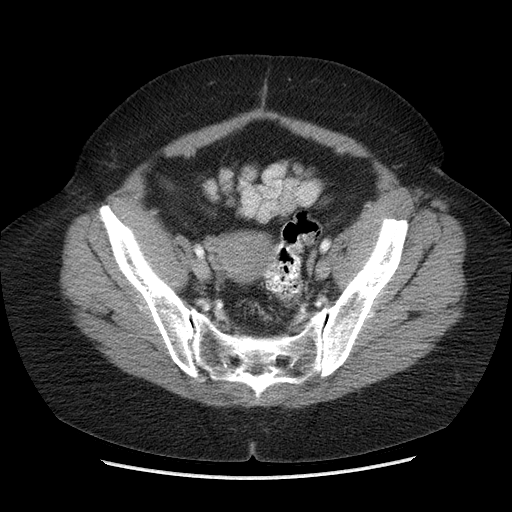
[im 21/74  soft-tissue]
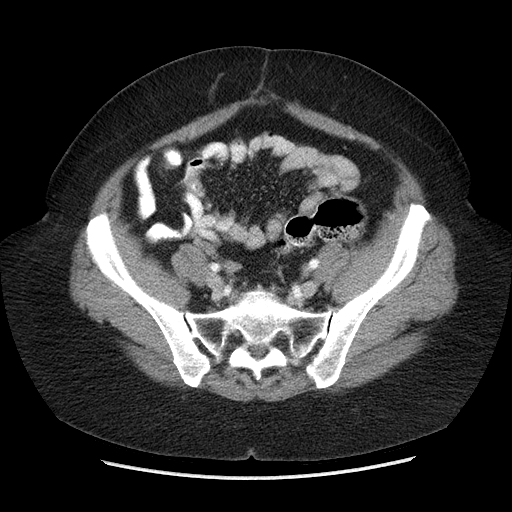
[im 25/74  soft-tissue]
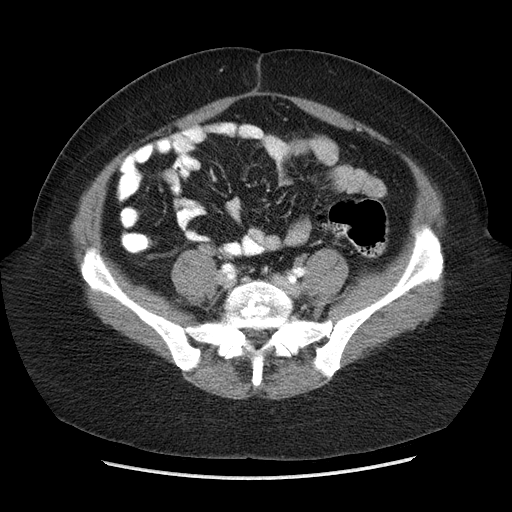
[im 29/74  soft-tissue]
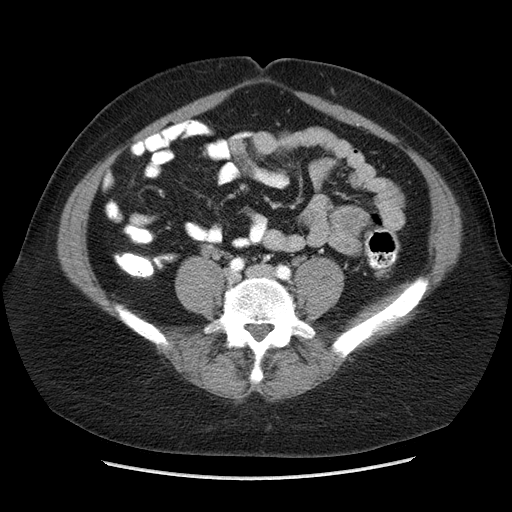
[im 33/74  soft-tissue]
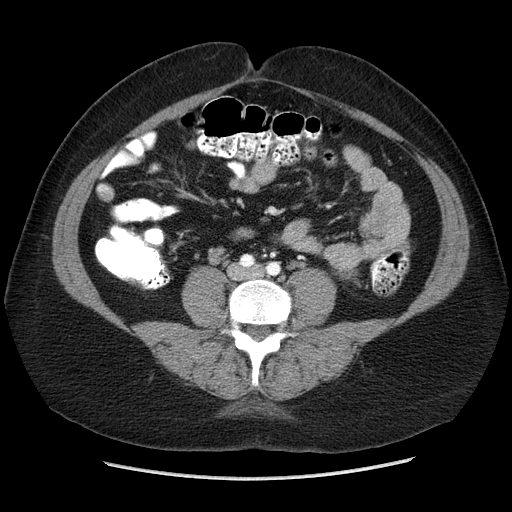
[im 41/74  soft-tissue]
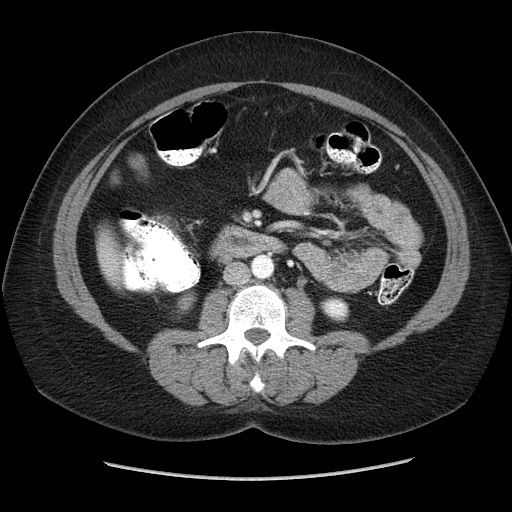
[im 45/74  soft-tissue]
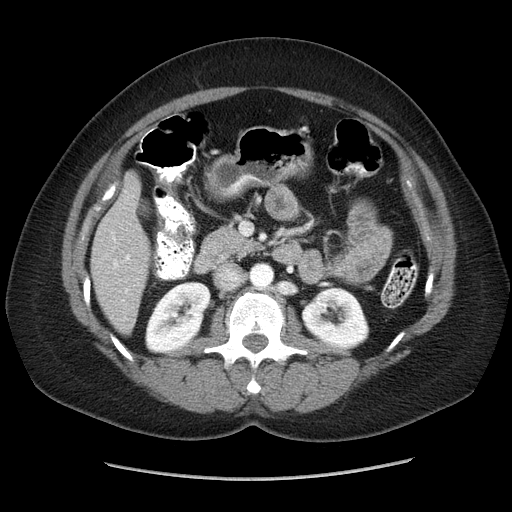
[im 45/74  bone]
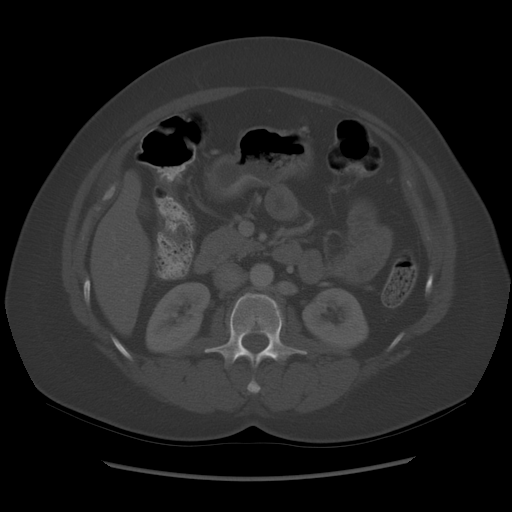
[im 49/74  soft-tissue]
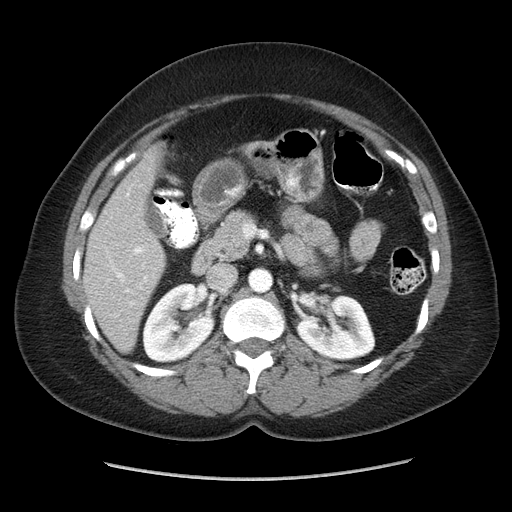
[im 53/74  soft-tissue]
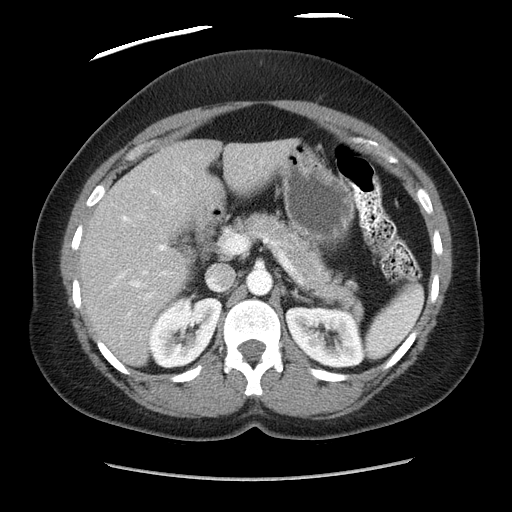
[im 57/74  soft-tissue]
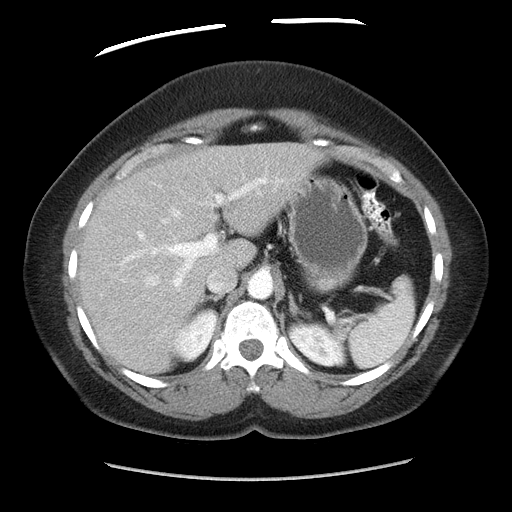
[im 65/74  soft-tissue]
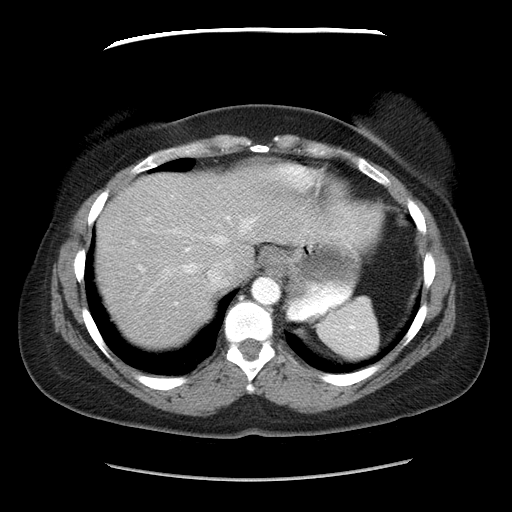
[im 69/74  soft-tissue]
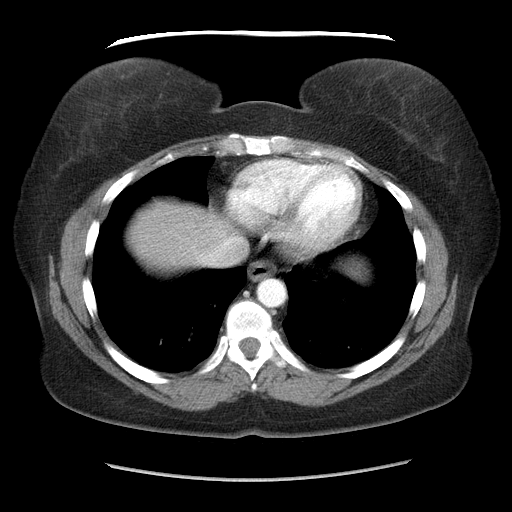

[Series 602: sagittal body · sagittal · 0.87mm/px · 3 of 151 slices shown]
[im 51/151  soft-tissue]
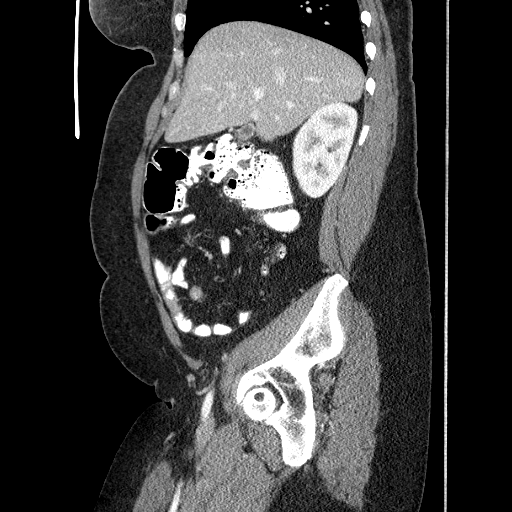
[im 67/151  soft-tissue]
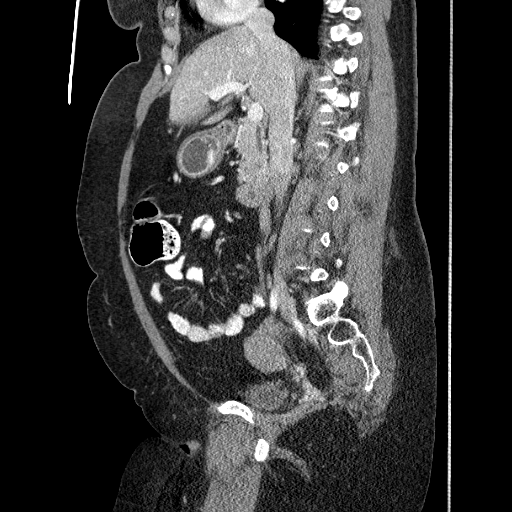
[im 84/151  soft-tissue]
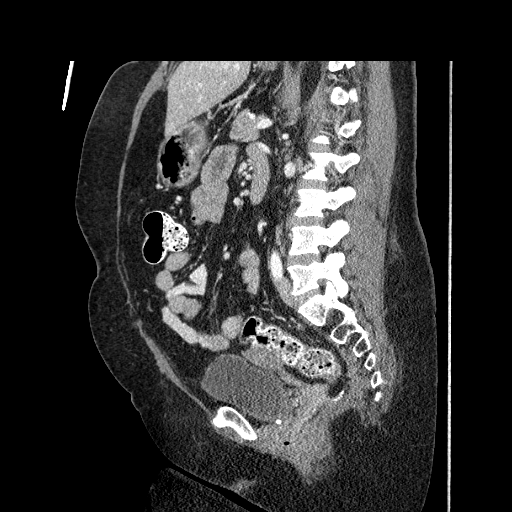

[17 of 46 positions shown; findings below may reference images not displayed]

FINDINGS: Lung bases are clear.

There is mild fatty infiltration of the liver parenchyma.

The liver is otherwise normal.

The adrenal glands are normal.

Spleen is normal.

The right kidney and left kidney are both normal.

There are no enlarged retroperitoneal or small bowel mesenteric
lymph nodes.

There is no ascites.

No upper abdominal mass is identified.

Review of the visualized osseous structures shows no lytic or
sclerotic lesions.
IMPRESSION: 1.  No evidence for metastatic disease.

CT PELVIS
FINDINGS: The urinary bladder is negative.

The uterus and the adnexal structures are unremarkable.

No enlarged pelvic or inguinal lymph nodes.

No free fluid or abnormal fluid collections.

 No masses noted.

The rectosigmoid colon appears normal.

The review of bone windows is unremarkable.
IMPRESSION: 1.  Negative for mass or adenopathy.

## 2007-07-18 ENCOUNTER — Ambulatory Visit: Payer: Self-pay | Admitting: Hematology and Oncology

## 2007-07-29 ENCOUNTER — Ambulatory Visit (HOSPITAL_COMMUNITY): Admission: RE | Admit: 2007-07-29 | Discharge: 2007-07-29 | Payer: Self-pay | Admitting: Hematology and Oncology

## 2007-07-29 IMAGING — CR DG CHEST 2V
2 series · 2 of 2 positions shown · non-contrast
Comparison: PA and lateral chest [DATE].

CLINICAL DATA: History of rectal carcinoma.

CHEST - 2 VIEW

[view not recorded (1 of 2)]
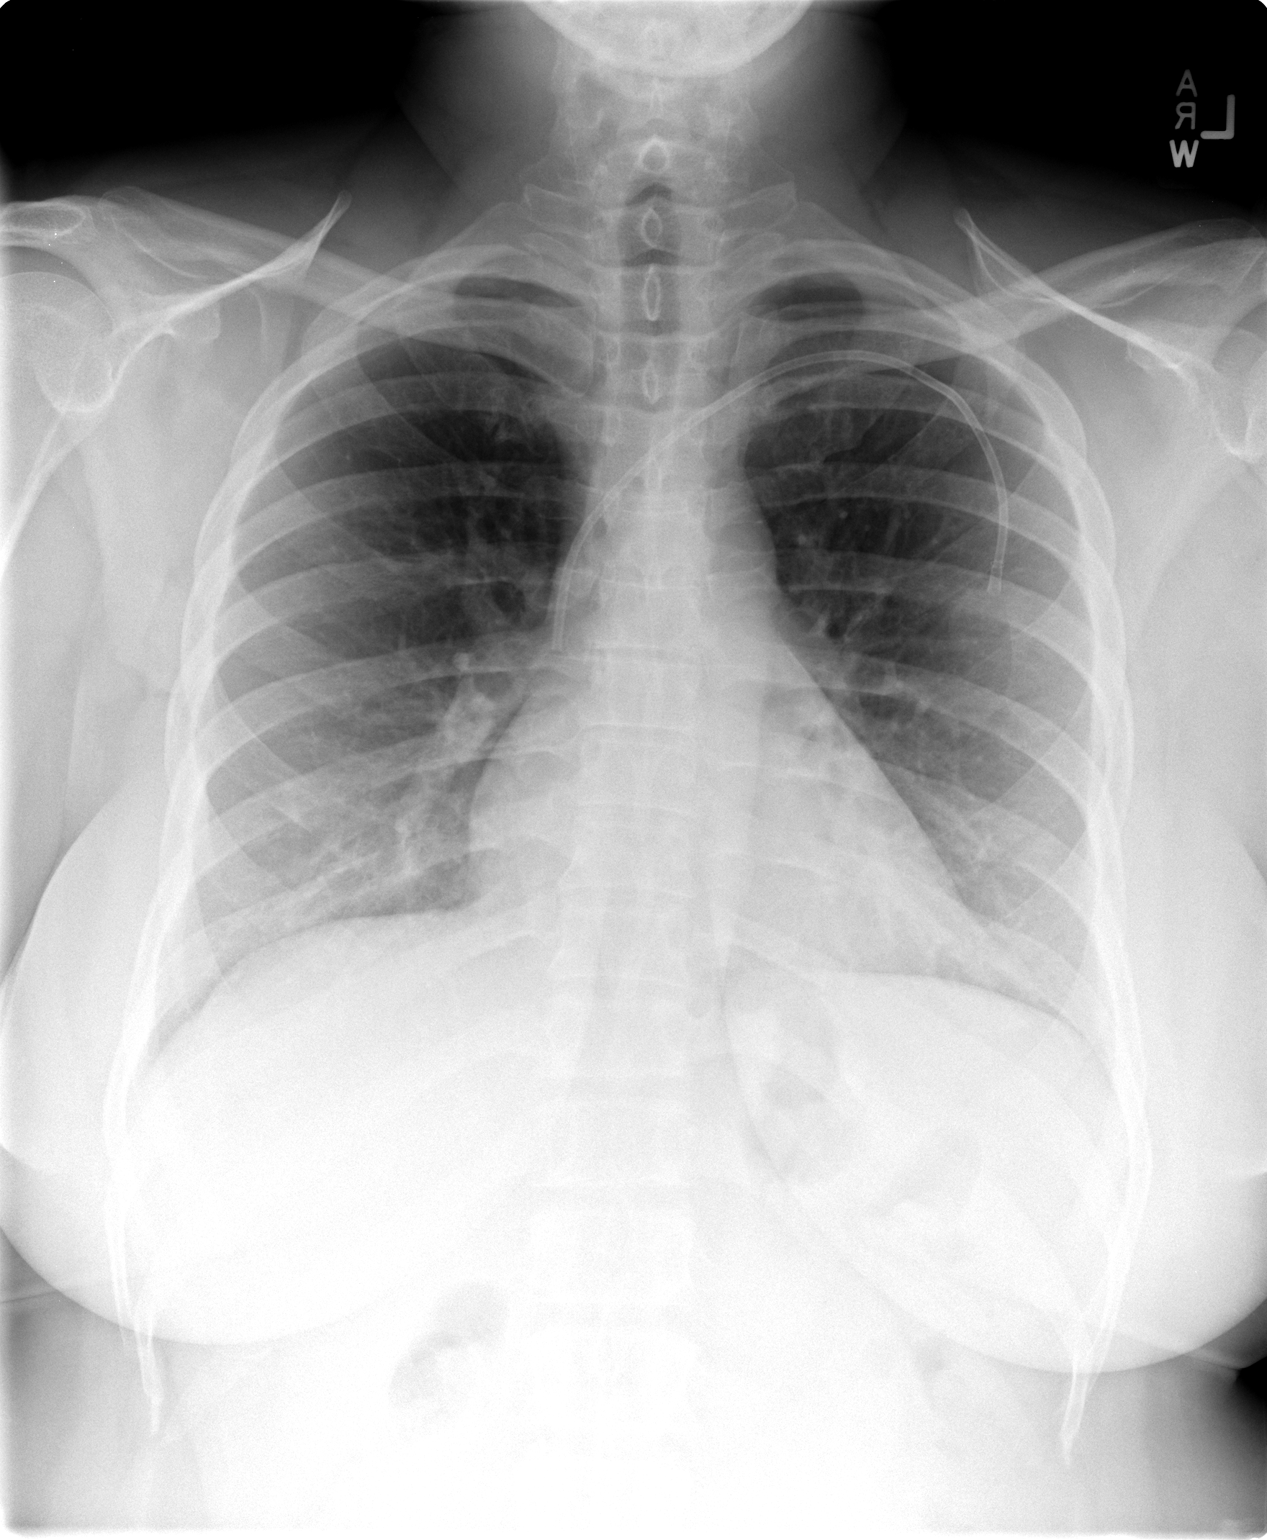

[view not recorded (2 of 2)]
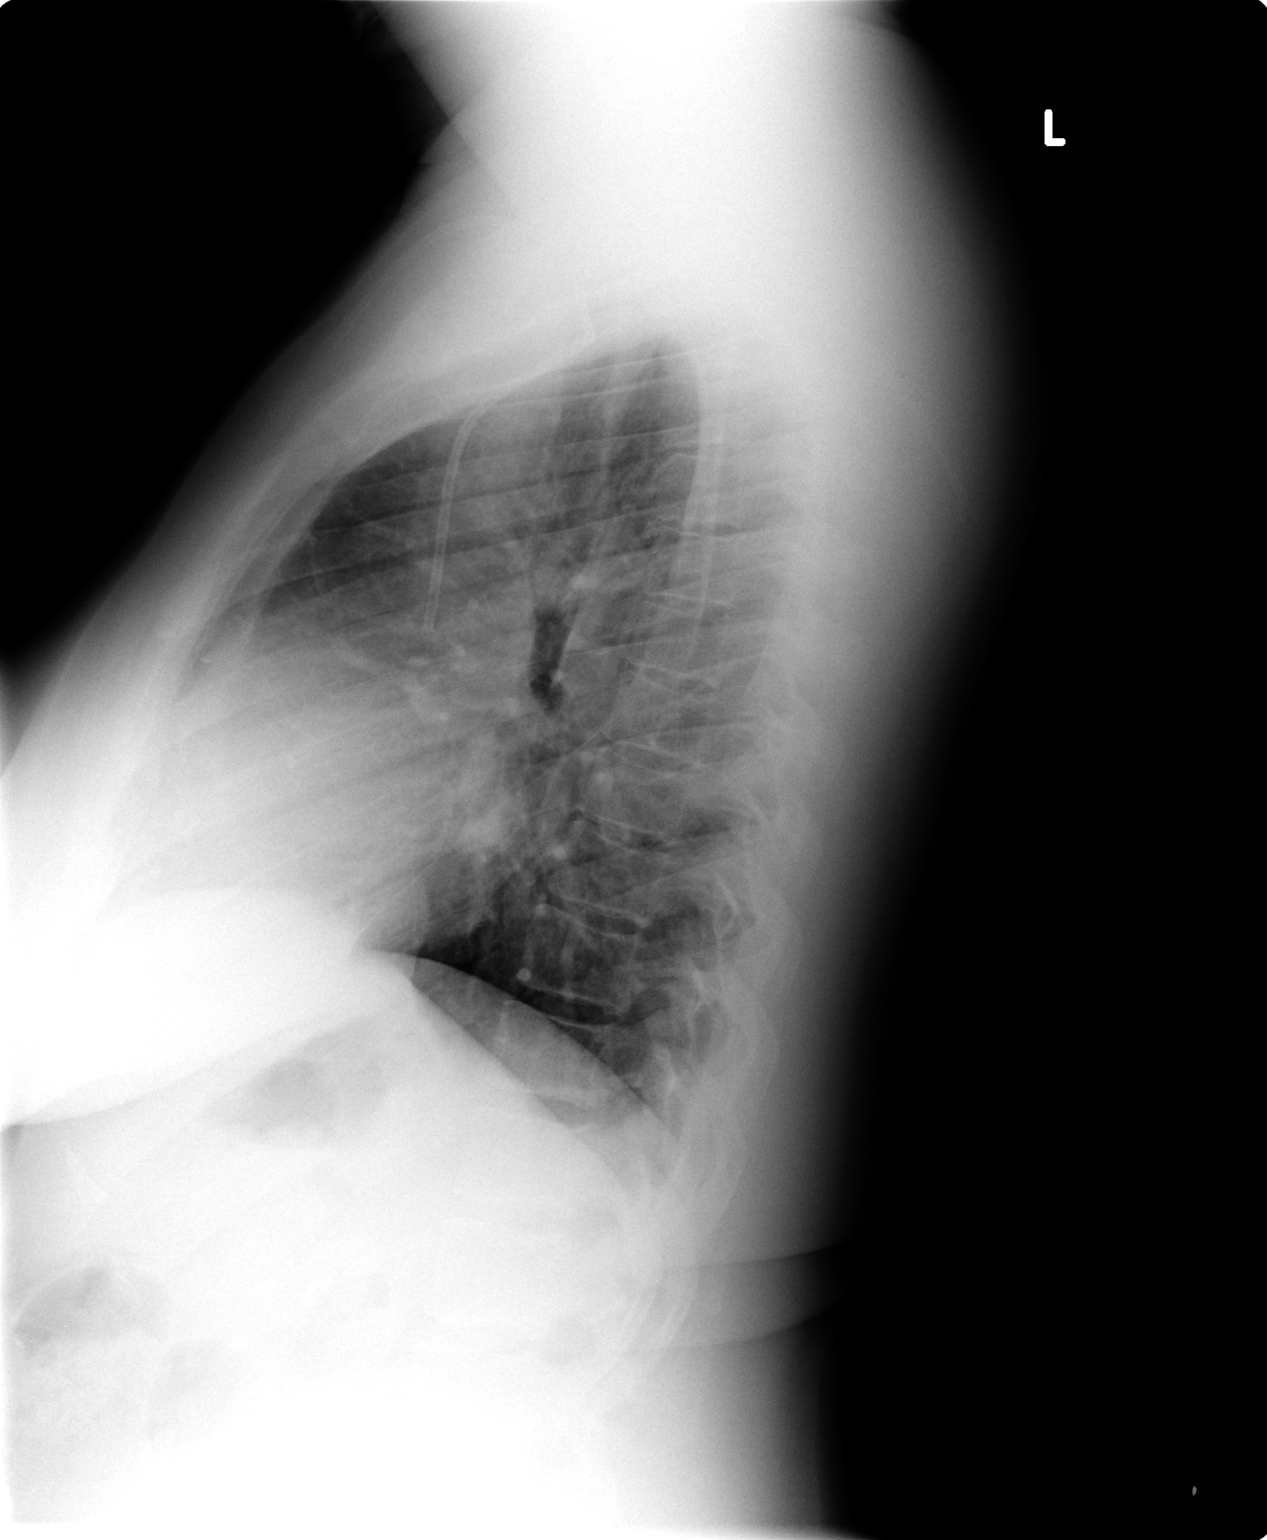

[2 of 2 positions shown; findings below may reference images not displayed]

FINDINGS: Port-A-Cath is in place in good position without
complicating features.  The lungs are clear.  Cardiac and
mediastinal contours are normal.  No pleural effusion or focal bony
abnormality.
IMPRESSION: No acute disease.  No evidence of metastatic disease.

## 2007-10-17 ENCOUNTER — Ambulatory Visit: Payer: Self-pay | Admitting: Hematology and Oncology

## 2007-12-19 ENCOUNTER — Ambulatory Visit: Payer: Self-pay | Admitting: Hematology and Oncology

## 2007-12-23 LAB — CEA: CEA: 1.7 ng/mL (ref 0.0–5.0)

## 2007-12-23 LAB — CBC WITH DIFFERENTIAL/PLATELET
BASO%: 0.5 % (ref 0.0–2.0)
MCHC: 32.5 g/dL (ref 32.0–36.0)
MONO#: 0.4 10*3/uL (ref 0.1–0.9)
NEUT#: 1.4 10*3/uL — ABNORMAL LOW (ref 1.5–6.5)
NEUT%: 44.4 % (ref 39.6–76.8)
RDW: 15.1 % — ABNORMAL HIGH (ref 11.3–14.5)
lymph#: 1.3 10*3/uL (ref 0.9–3.3)

## 2007-12-23 LAB — COMPREHENSIVE METABOLIC PANEL
ALT: 13 U/L (ref 0–35)
AST: 14 U/L (ref 0–37)
Albumin: 4.2 g/dL (ref 3.5–5.2)
CO2: 23 mEq/L (ref 19–32)
Calcium: 8.9 mg/dL (ref 8.4–10.5)
Chloride: 108 mEq/L (ref 96–112)
Creatinine, Ser: 0.95 mg/dL (ref 0.40–1.20)
Potassium: 3.9 mEq/L (ref 3.5–5.3)
Total Protein: 6.7 g/dL (ref 6.0–8.3)

## 2008-02-18 ENCOUNTER — Ambulatory Visit: Payer: Self-pay | Admitting: Hematology and Oncology

## 2008-04-26 ENCOUNTER — Encounter: Admission: RE | Admit: 2008-04-26 | Discharge: 2008-04-26 | Payer: Self-pay | Admitting: Hematology and Oncology

## 2008-04-26 IMAGING — CT CT PELVIS W/ CM
2 of 5 series · 17 of 46 positions shown, 19 images · IV contrast ([ID] OMNI 300)
Comparison: CT abdomen pelvis of [DATE]

CT ABDOMEN

CLINICAL DATA: Follow up of rectal carcinoma, prior surgery as
well as chemotherapy and radiation

CT ABDOMEN AND PELVIS WITH CONTRAST
TECHNIQUE: Multidetector CT imaging of the abdomen and pelvis was
performed using the standard protocol following bolus
administration of intravenous contrast.
Contrast: 125 ml [PO]

[Series 3: routine abdomen · axial · 0.70mm/px · z∈[-370,-20]mm · 14 of 80 slices shown, 16 images]
[im 5/80  soft-tissue]
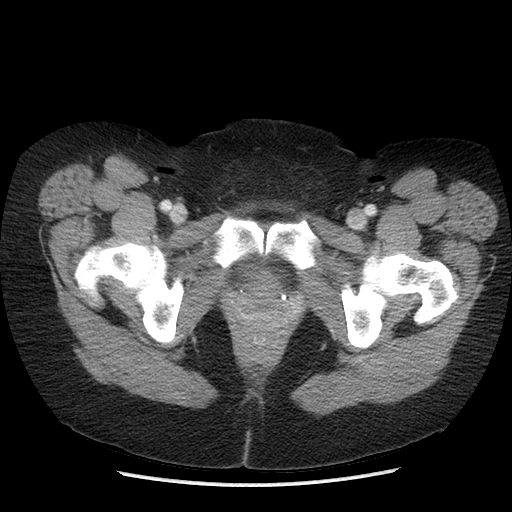
[im 5/80  bone]
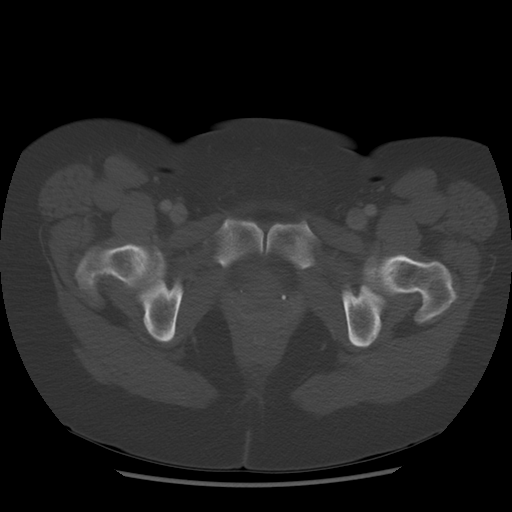
[im 9/80  soft-tissue]
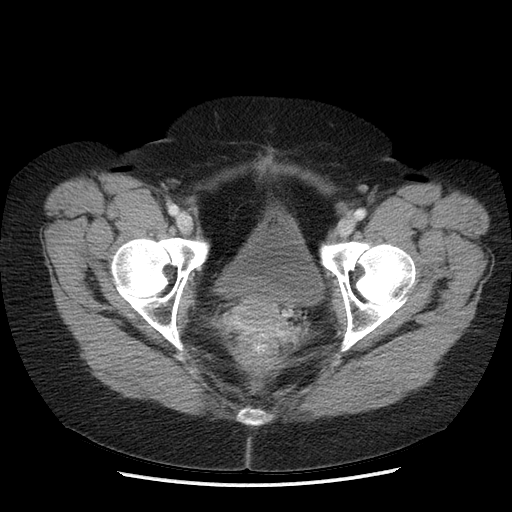
[im 18/80  soft-tissue]
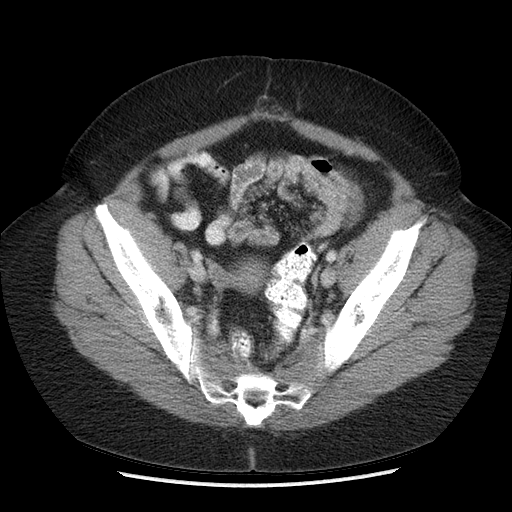
[im 22/80  soft-tissue]
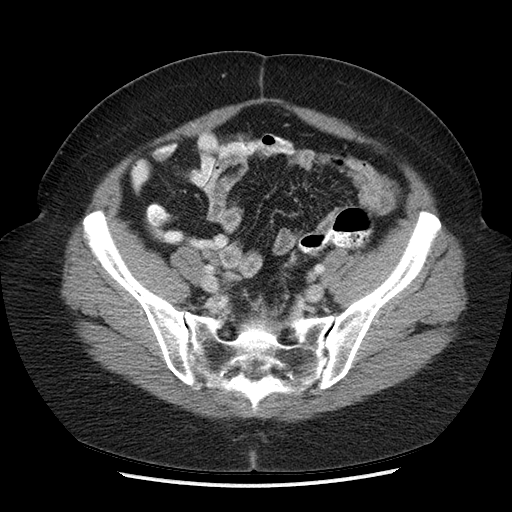
[im 27/80  soft-tissue]
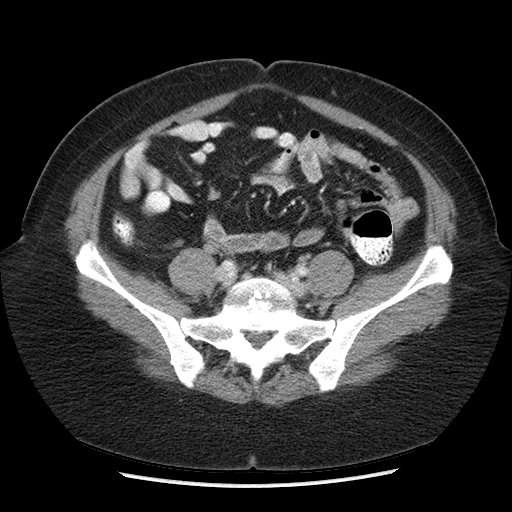
[im 31/80  soft-tissue]
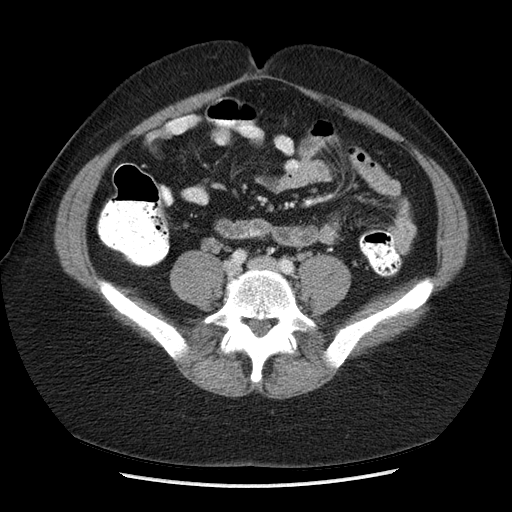
[im 36/80  soft-tissue]
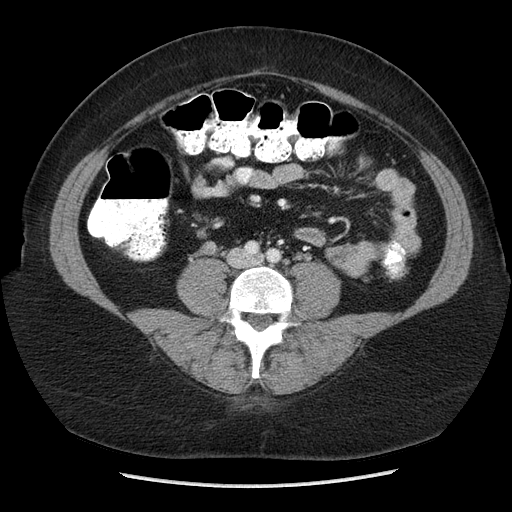
[im 44/80  soft-tissue]
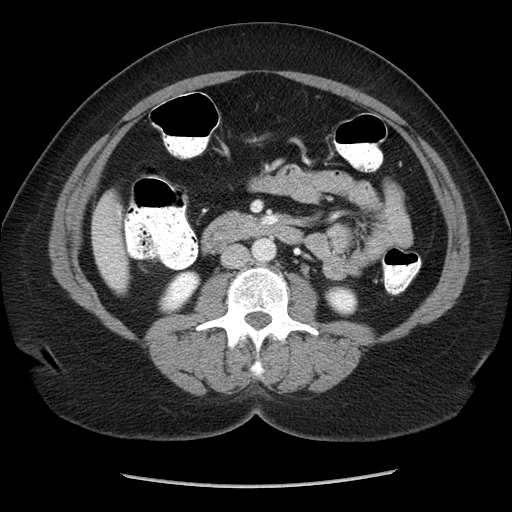
[im 49/80  soft-tissue]
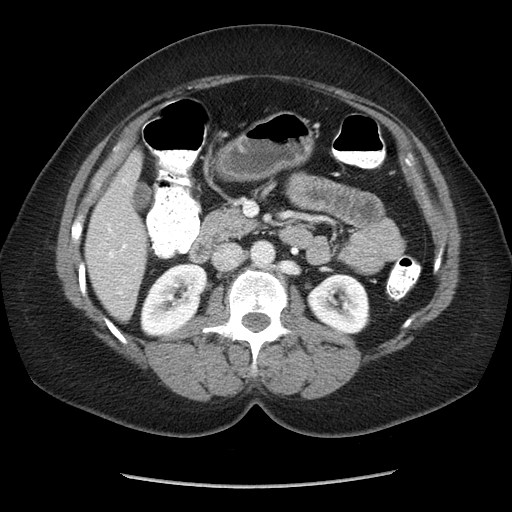
[im 49/80  bone]
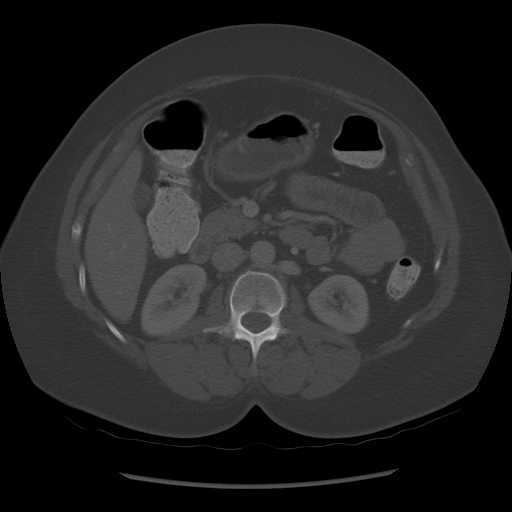
[im 53/80  soft-tissue]
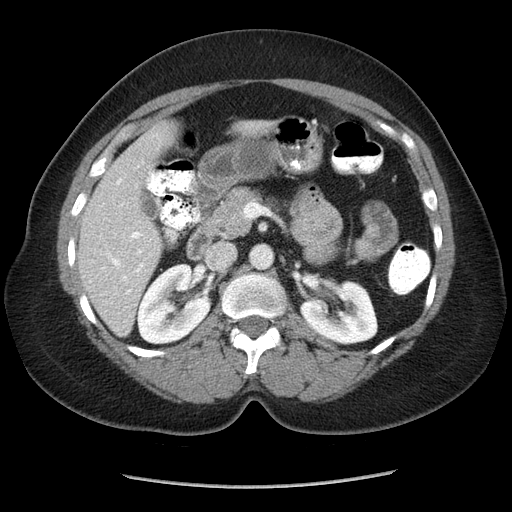
[im 58/80  soft-tissue]
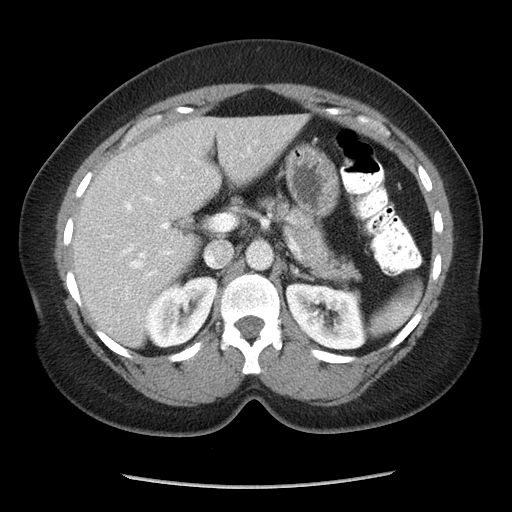
[im 62/80  soft-tissue]
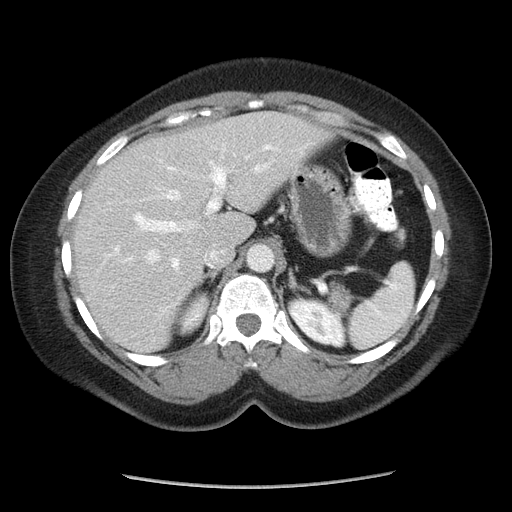
[im 71/80  soft-tissue]
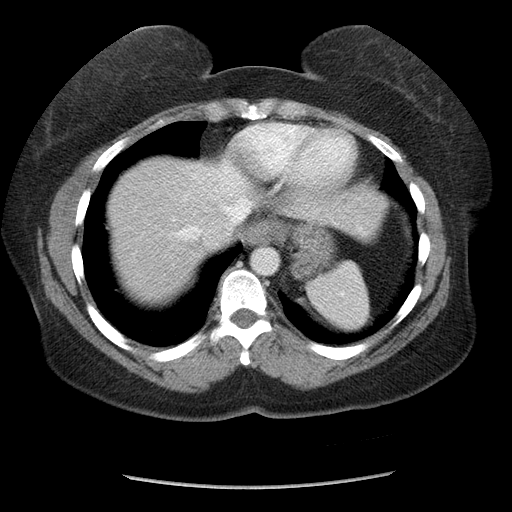
[im 75/80  soft-tissue]
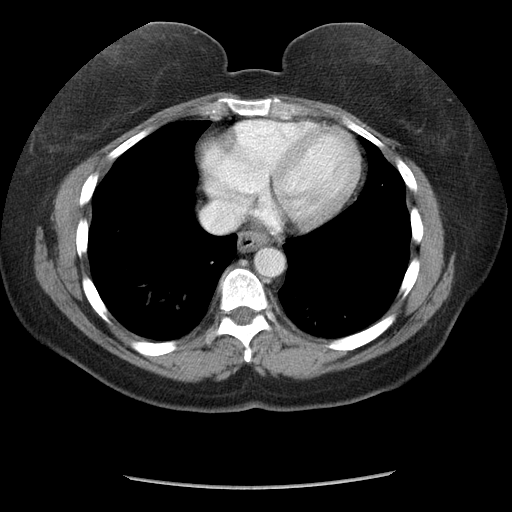

[Series 602: sagittal body · sagittal · 0.84mm/px · 3 of 145 slices shown]
[im 49/145  soft-tissue]
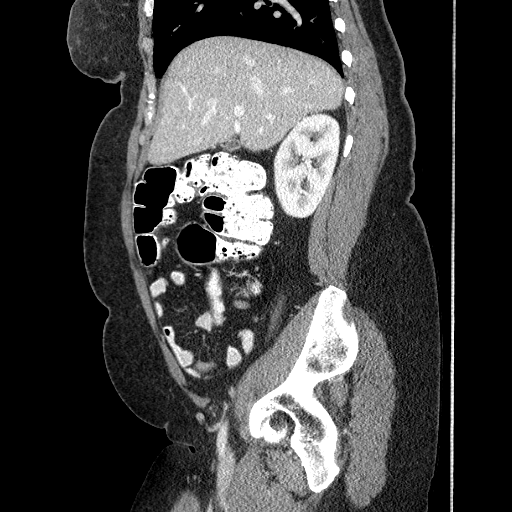
[im 65/145  soft-tissue]
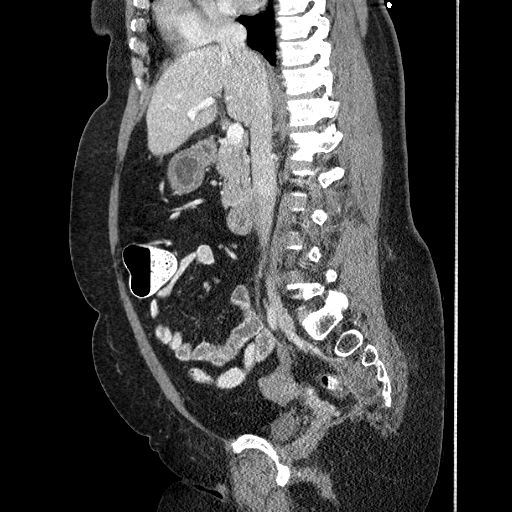
[im 81/145  soft-tissue]
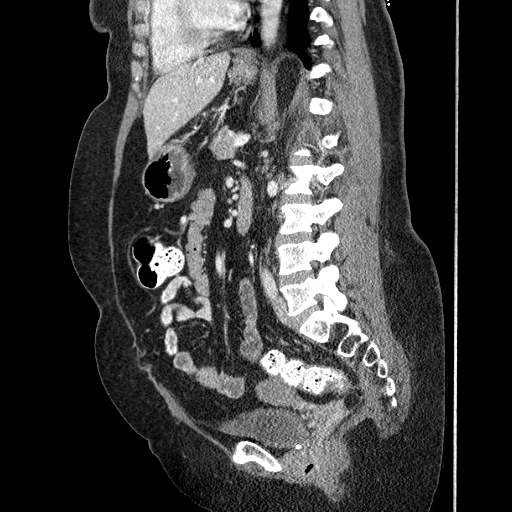

[17 of 46 positions shown; findings below may reference images not displayed]

FINDINGS: The lung bases are clear.  The liver enhances with no
focal abnormality and no ductal dilatation is seen.  The
gallbladder is slightly contracted and no calcified gallstones are
noted.  The pancreas is normal in size and the pancreatic duct is
not dilated.  The adrenal glands and spleen appear normal.  The
kidneys enhance and on delayed images no mass or hydronephrosis is
seen.  The abdominal aorta is normal in caliber.  Retrocaval left
renal vein is noted.  No adenopathy is seen.  No abnormality of the
bowel is noted.  A few small nodes are present in the right lower
quadrant.
IMPRESSION: No evidence of metastatic disease or recurrence.

CT PELVIS
FINDINGS: The appendix and the terminal ileum are well seen and
normal.  The urinary bladder is not distended but unremarkable.
The uterus is normal in size.  No adnexal lesion is seen.  Surgical
sutures are noted in the rectum, but there is no evidence of local
recurrence of tumor and no pelvic mass or adenopathy is seen.
Presacral scarring is stable.  No bony abnormality is seen.  There
is apparent calcified disc at the L5-S1 level.
IMPRESSION: Surgical changes with presacral scarring.  No evidence of
recurrence tumor and no adenopathy.

## 2008-04-27 ENCOUNTER — Ambulatory Visit: Payer: Self-pay | Admitting: Hematology and Oncology

## 2008-04-29 LAB — CBC WITH DIFFERENTIAL/PLATELET
Basophils Absolute: 0 10*3/uL (ref 0.0–0.1)
EOS%: 2.5 % (ref 0.0–7.0)
Eosinophils Absolute: 0.1 10*3/uL (ref 0.0–0.5)
HCT: 36.8 % (ref 34.8–46.6)
HGB: 11.9 g/dL (ref 11.6–15.9)
MONO#: 0.5 10*3/uL (ref 0.1–0.9)
NEUT#: 2 10*3/uL (ref 1.5–6.5)
NEUT%: 51.1 % (ref 38.4–76.8)
RDW: 15 % — ABNORMAL HIGH (ref 11.2–14.5)
WBC: 3.8 10*3/uL — ABNORMAL LOW (ref 3.9–10.3)
lymph#: 1.3 10*3/uL (ref 0.9–3.3)

## 2008-04-29 LAB — COMPREHENSIVE METABOLIC PANEL
AST: 11 U/L (ref 0–37)
Albumin: 4.2 g/dL (ref 3.5–5.2)
BUN: 13 mg/dL (ref 6–23)
CO2: 23 mEq/L (ref 19–32)
Calcium: 9 mg/dL (ref 8.4–10.5)
Chloride: 108 mEq/L (ref 96–112)
Creatinine, Ser: 0.92 mg/dL (ref 0.40–1.20)
Potassium: 4.1 mEq/L (ref 3.5–5.3)

## 2008-04-29 LAB — CEA: CEA: 1.5 ng/mL (ref 0.0–5.0)

## 2008-10-20 ENCOUNTER — Ambulatory Visit: Payer: Self-pay | Admitting: Hematology and Oncology

## 2008-10-27 LAB — CBC WITH DIFFERENTIAL/PLATELET
BASO%: 0.3 % (ref 0.0–2.0)
LYMPH%: 43 % (ref 14.0–49.7)
MCHC: 32.2 g/dL (ref 31.5–36.0)
MONO#: 0.4 10*3/uL (ref 0.1–0.9)
NEUT#: 1.7 10*3/uL (ref 1.5–6.5)
RBC: 5.3 10*6/uL (ref 3.70–5.45)
RDW: 14.5 % (ref 11.2–14.5)
WBC: 3.8 10*3/uL — ABNORMAL LOW (ref 3.9–10.3)
lymph#: 1.7 10*3/uL (ref 0.9–3.3)
nRBC: 0 % (ref 0–0)

## 2008-10-27 LAB — COMPREHENSIVE METABOLIC PANEL
AST: 13 U/L (ref 0–37)
Albumin: 4.1 g/dL (ref 3.5–5.2)
BUN: 11 mg/dL (ref 6–23)
CO2: 24 mEq/L (ref 19–32)
Calcium: 8.9 mg/dL (ref 8.4–10.5)
Chloride: 105 mEq/L (ref 96–112)
Creatinine, Ser: 0.88 mg/dL (ref 0.40–1.20)
Glucose, Bld: 126 mg/dL — ABNORMAL HIGH (ref 70–99)
Potassium: 4.2 mEq/L (ref 3.5–5.3)

## 2008-10-27 LAB — CEA: CEA: 1.5 ng/mL (ref 0.0–5.0)

## 2008-10-29 ENCOUNTER — Ambulatory Visit (HOSPITAL_COMMUNITY): Admission: RE | Admit: 2008-10-29 | Discharge: 2008-10-29 | Payer: Self-pay | Admitting: Hematology and Oncology

## 2008-10-29 IMAGING — CT CT CHEST W/ CM
2 of 6 series · 16 of 46 positions shown, 18 images · IV contrast (agent unspecified)
Comparison: Abdominal/pelvic CTs of [DATE].  Chest CT
[DATE].  Plain film chest of [DATE].

CT CHEST

CLINICAL DATA: Colon cancer.  Rectal cancer.  Chemotherapy and
radiation therapy complete.

CT CHEST, ABDOMEN AND PELVIS WITH CONTRAST
TECHNIQUE: Contiguous axial images of the chest abdomen and pelvis
were obtained after IV contrast administration.
Contrast: 125 ml [QF]

[Series 2: cap with st · axial · 0.80mm/px · z∈[-520,-15]mm · 13 of 117 slices shown, 15 images]
[im 8/117  soft-tissue]
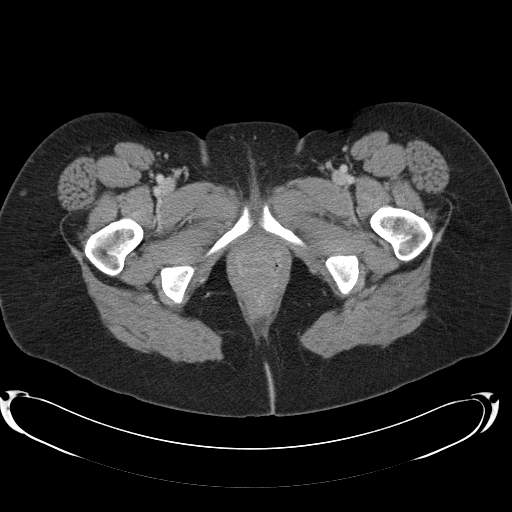
[im 8/117  bone]
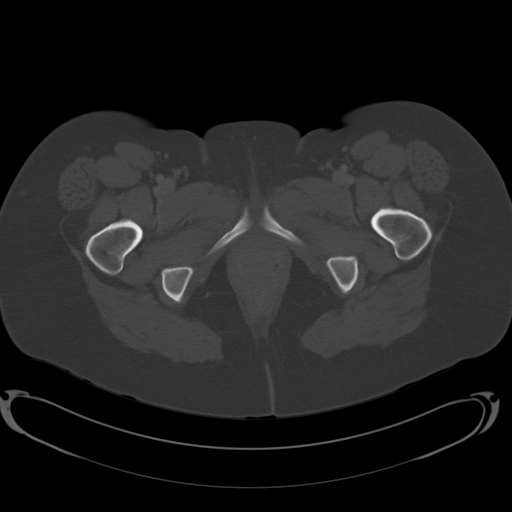
[im 15/117  soft-tissue]
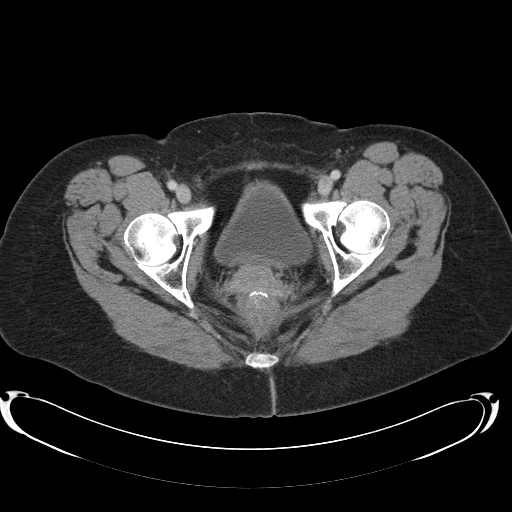
[im 22/117  soft-tissue]
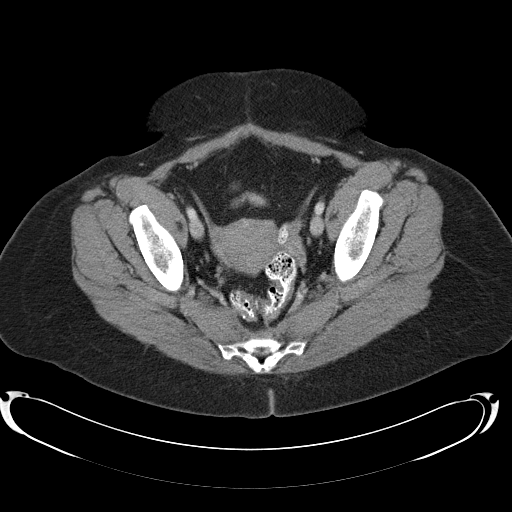
[im 37/117  soft-tissue]
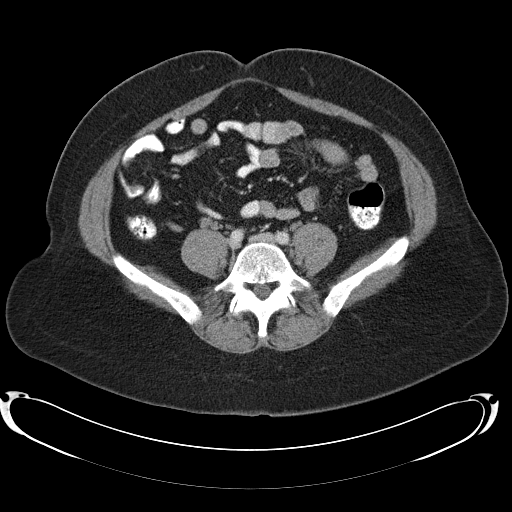
[im 44/117  soft-tissue]
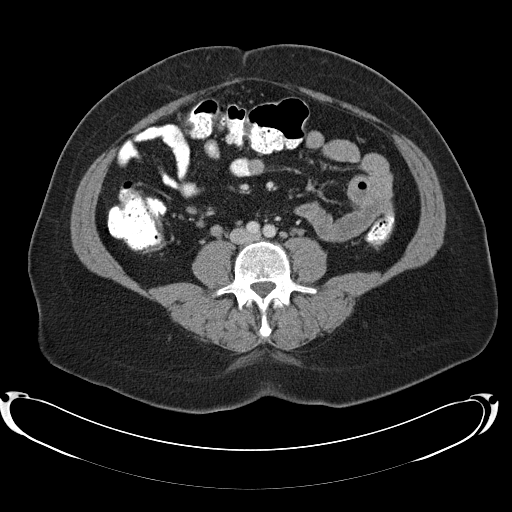
[im 51/117  soft-tissue]
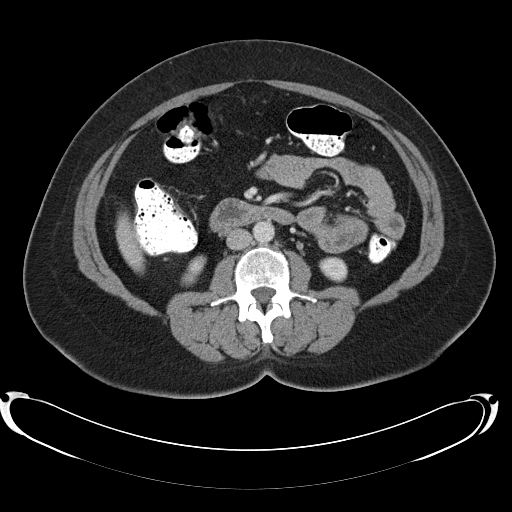
[im 59/117  soft-tissue]
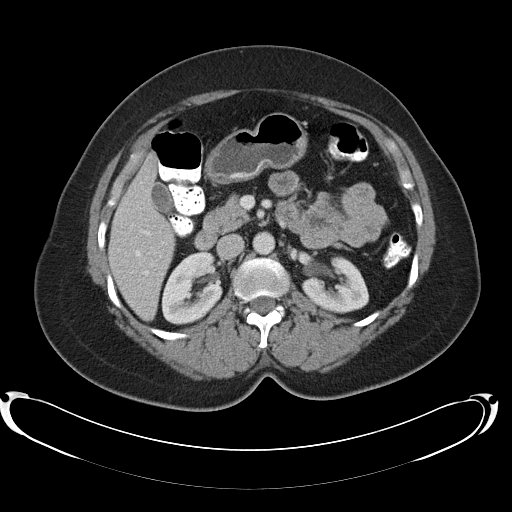
[im 66/117  soft-tissue]
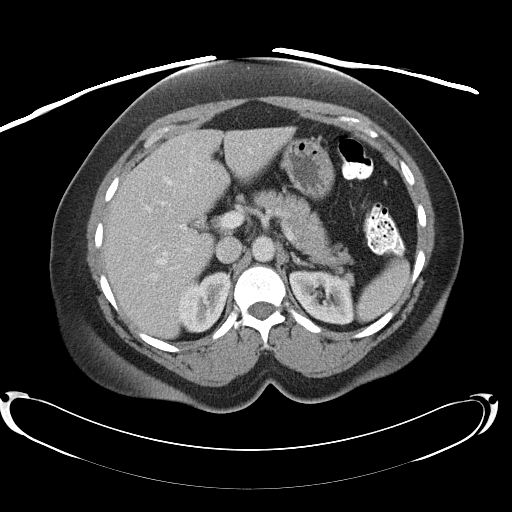
[im 73/117  soft-tissue]
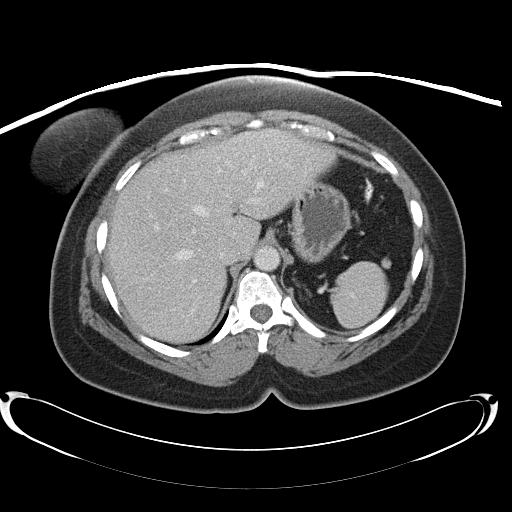
[im 73/117  bone]
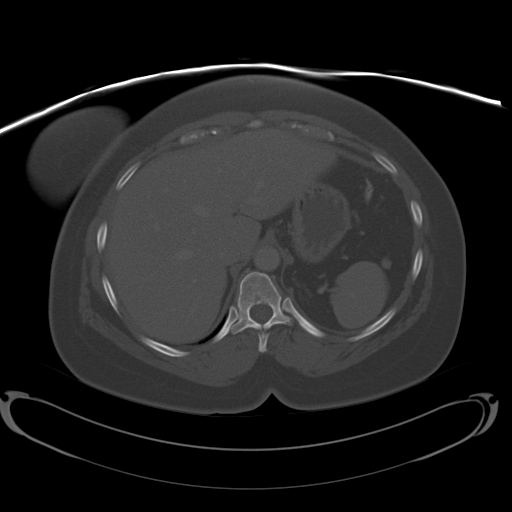
[im 80/117  soft-tissue]
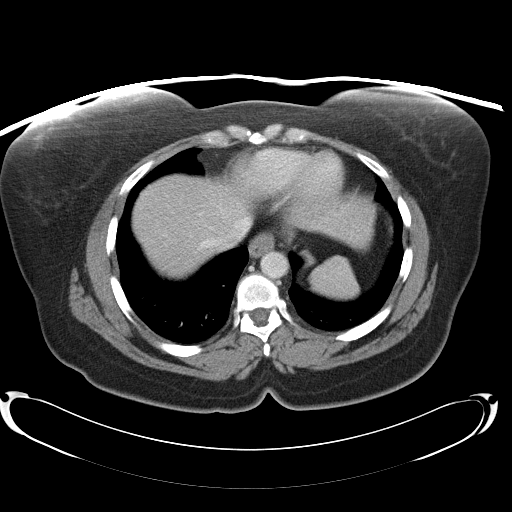
[im 95/117  soft-tissue]
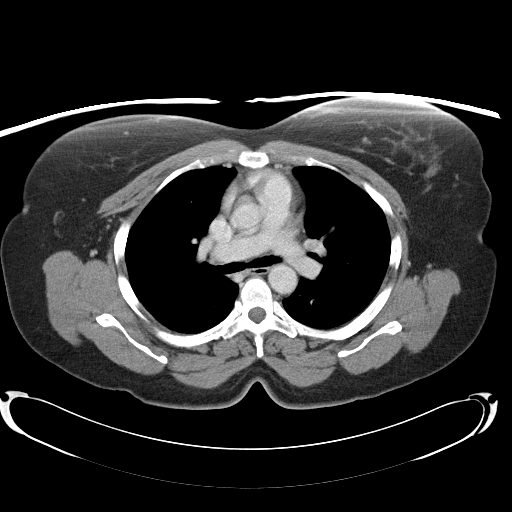
[im 102/117  soft-tissue]
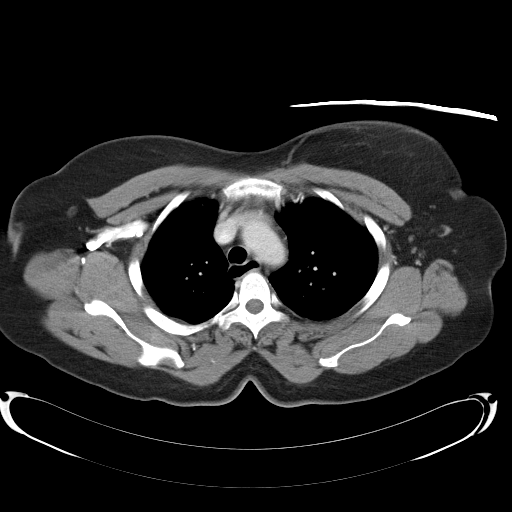
[im 109/117  soft-tissue]
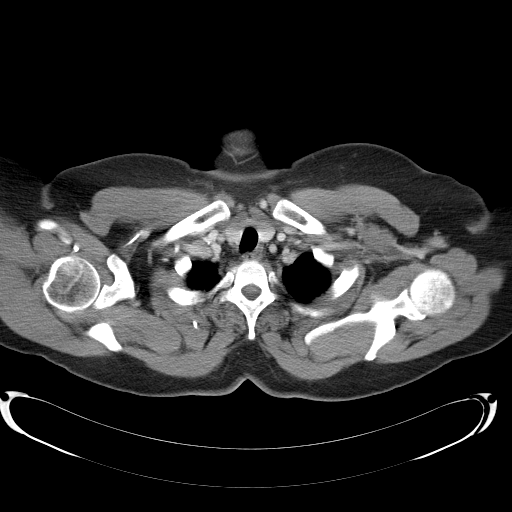

[Series 602: <mpr thick range> · coronal · 1.14mm/px · 3 of 92 slices shown]
[im 31/92  soft-tissue]
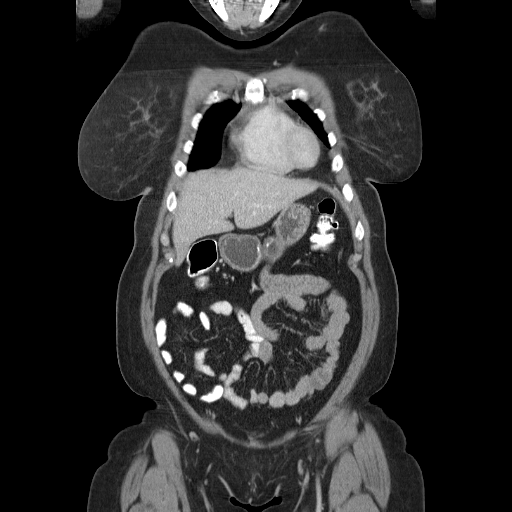
[im 41/92  soft-tissue]
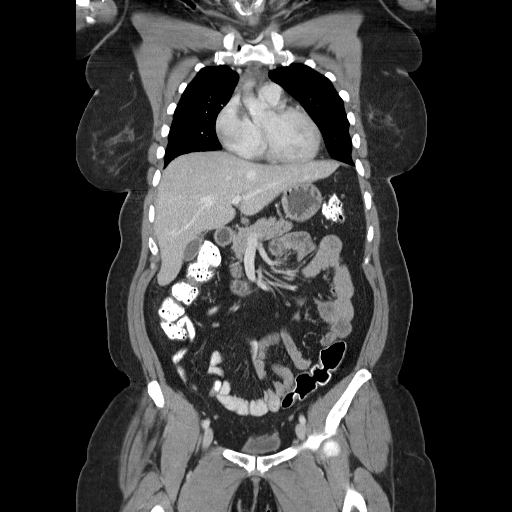
[im 51/92  soft-tissue]
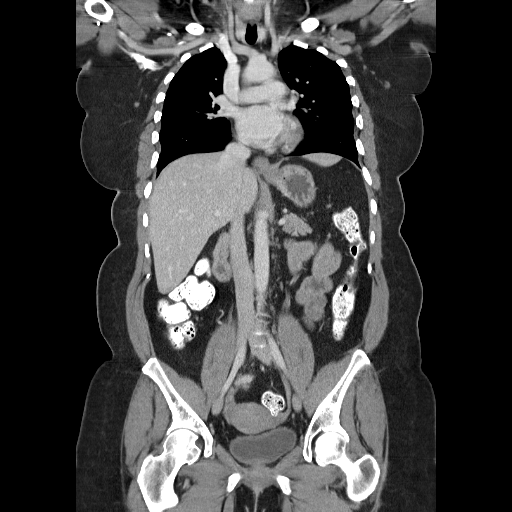

[16 of 46 positions shown; findings below may reference images not displayed]

FINDINGS: Lung windows demonstrate no nodules or airspace
opacities.

Soft tissue windows demonstrate normal heart size without
pericardial or pleural effusion.  No mediastinal or hilar
adenopathy. A tiny hiatal hernia.  The previously described
esophageal wall thickening is no longer appreciated. Soft tissue
density in the anterior mediastinum is likely residual thymus.
Similar to on the prior exam.
IMPRESSION: No acute process or evidence of metastatic disease in the chest.

CT ABDOMEN
FINDINGS: A too small to characterize hepatic dome lesion on image
37 is most apparent on in the coronal images of the prior exam and
is felt to be similar.  Small splenule.

Normal stomach, pancreas, gallbladder, biliary tract, adrenal
glands, kidneys.  Retroaortic left renal vein. No retroperitoneal
or retrocrural adenopathy. Normal colon, appendix, and terminal
ileum.

Normal abdominal small bowel without ascites.
IMPRESSION: 1. No acute process or evidence of metastatic disease in the
abdomen.
2.  A tiny hepatic dome lesion is better visualized on the axial
image today but felt to be similar on prior coronals.  Recommend
attention on follow-up.

CT PELVIS
FINDINGS: Surgical changes at the rectum.  Presacral fascial
thickening is similar likely postoperative.  No evidence of local
recurrence.  Normal pelvic small bowel.  Small nodes about the
common iliac arteries bilaterally are unchanged. No pelvic
adenopathy.    Normal urinary bladder and uterus.  No adnexal mass.
No significant free fluid.  No acute osseous abnormality.
IMPRESSION: 1. No acute process or evidence of metastatic disease in the
pelvis.
2.  Surgical changes at the rectum which are not significantly
changed.

## 2009-04-22 ENCOUNTER — Ambulatory Visit: Payer: Self-pay | Admitting: Hematology and Oncology

## 2009-04-26 ENCOUNTER — Ambulatory Visit (HOSPITAL_COMMUNITY): Admission: RE | Admit: 2009-04-26 | Discharge: 2009-04-26 | Payer: Self-pay | Admitting: Hematology and Oncology

## 2009-04-26 LAB — CBC WITH DIFFERENTIAL/PLATELET
Basophils Absolute: 0 10*3/uL (ref 0.0–0.1)
EOS%: 1.9 % (ref 0.0–7.0)
HGB: 11.8 g/dL (ref 11.6–15.9)
LYMPH%: 49.5 % (ref 14.0–49.7)
MCH: 22.8 pg — ABNORMAL LOW (ref 25.1–34.0)
MCV: 71.4 fL — ABNORMAL LOW (ref 79.5–101.0)
MONO%: 10.2 % (ref 0.0–14.0)
NEUT#: 1.4 10*3/uL — ABNORMAL LOW (ref 1.5–6.5)
Platelets: 216 10*3/uL (ref 145–400)
RBC: 5.18 10*6/uL (ref 3.70–5.45)
RDW: 14 % (ref 11.2–14.5)
lymph#: 1.9 10*3/uL (ref 0.9–3.3)

## 2009-04-26 LAB — COMPREHENSIVE METABOLIC PANEL
ALT: 13 U/L (ref 0–35)
Albumin: 3.8 g/dL (ref 3.5–5.2)
Alkaline Phosphatase: 75 U/L (ref 39–117)
CO2: 26 mEq/L (ref 19–32)
Calcium: 8.7 mg/dL (ref 8.4–10.5)
Chloride: 108 mEq/L (ref 96–112)
Potassium: 4 mEq/L (ref 3.5–5.3)
Total Bilirubin: 0.6 mg/dL (ref 0.3–1.2)

## 2009-04-26 LAB — CEA: CEA: 1.5 ng/mL (ref 0.0–5.0)

## 2009-04-26 IMAGING — CT CT ABD-PELV W/ CM
2 of 6 series · 17 of 46 positions shown, 19 images · IV contrast (agent unspecified)
Comparison: [DATE] and [DATE].

CLINICAL DATA: Rectal cancer diagnosed in [1C].  Colon resection.
Chemotherapy and radiation therapy complete.  Some abdominal pain.

CT ABDOMEN AND PELVIS WITH CONTRAST
TECHNIQUE: Multidetector CT imaging of the abdomen and pelvis was
performed following the standard protocol during bolus
administration of intravenous contrast.
Contrast: 100  ml [1C]

[Series 2: rtn a/p with · axial · 0.74mm/px · z∈[-461,-86]mm · 14 of 87 slices shown, 16 images]
[im 6/87  soft-tissue]
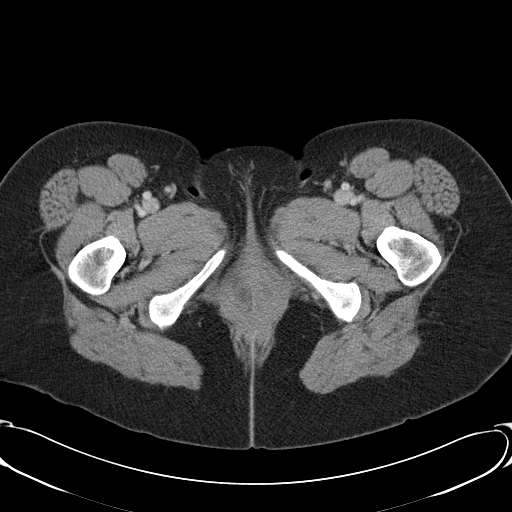
[im 6/87  bone]
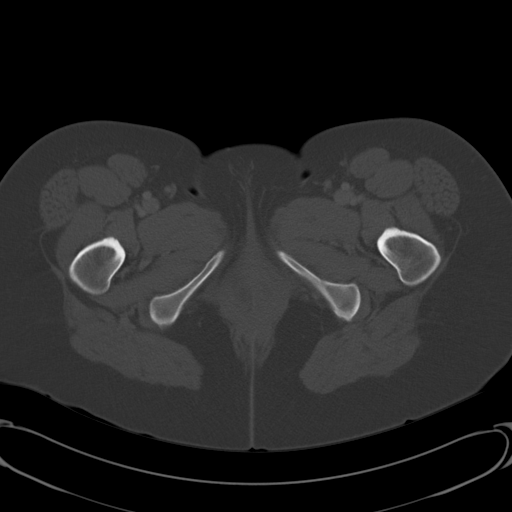
[im 11/87  soft-tissue]
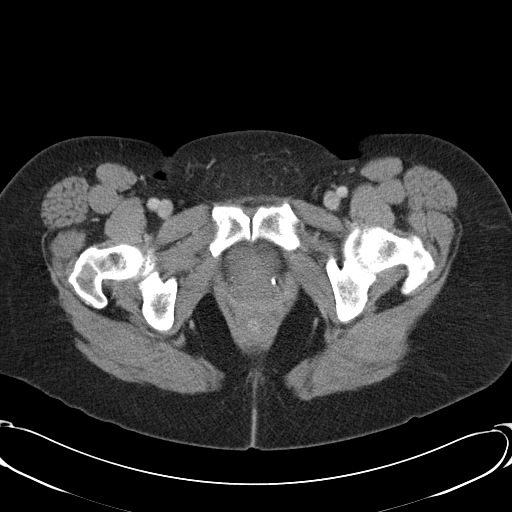
[im 16/87  soft-tissue]
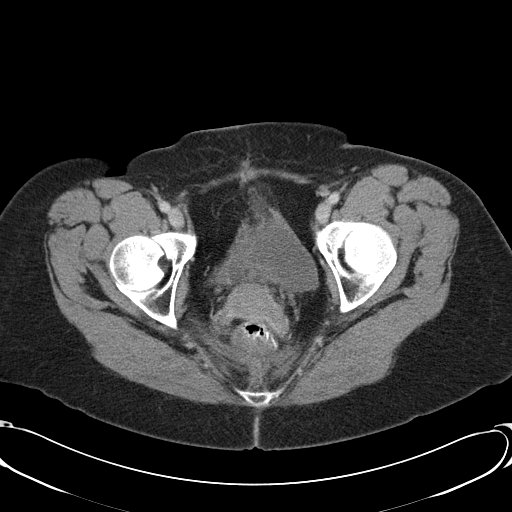
[im 26/87  soft-tissue]
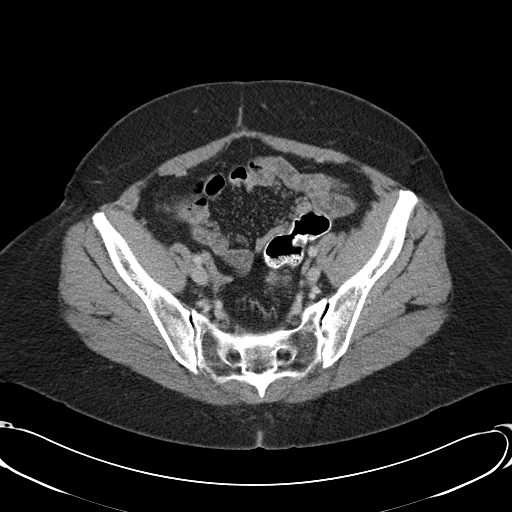
[im 31/87  soft-tissue]
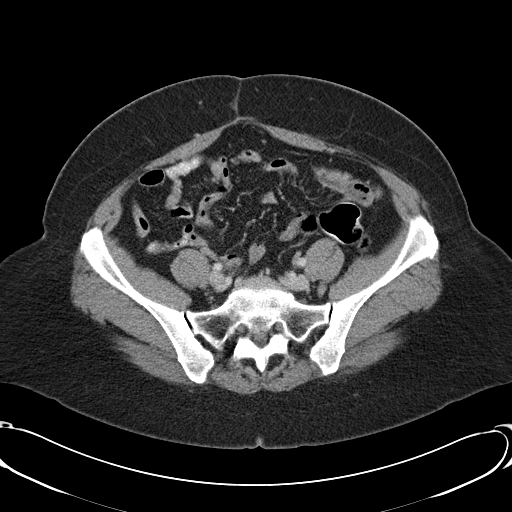
[im 36/87  soft-tissue]
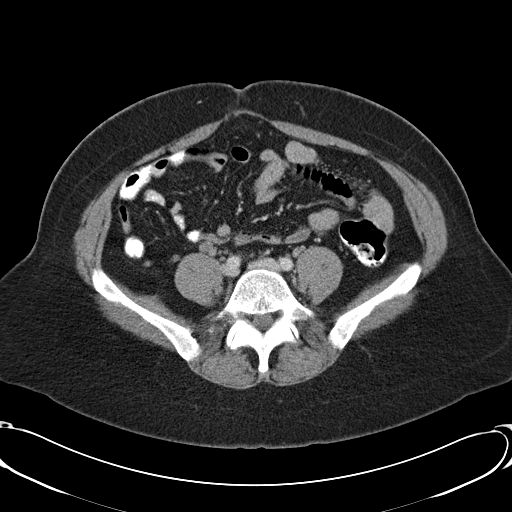
[im 41/87  soft-tissue]
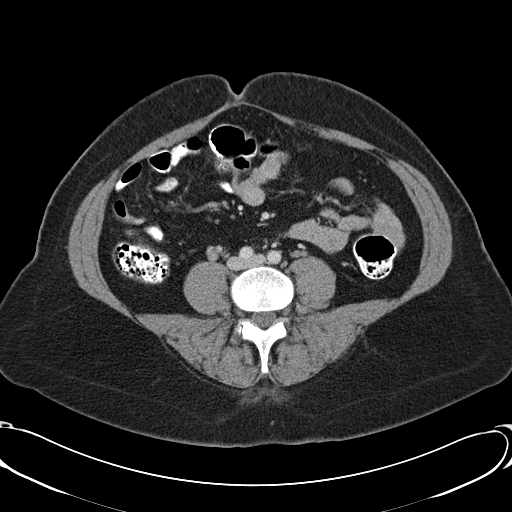
[im 46/87  soft-tissue]
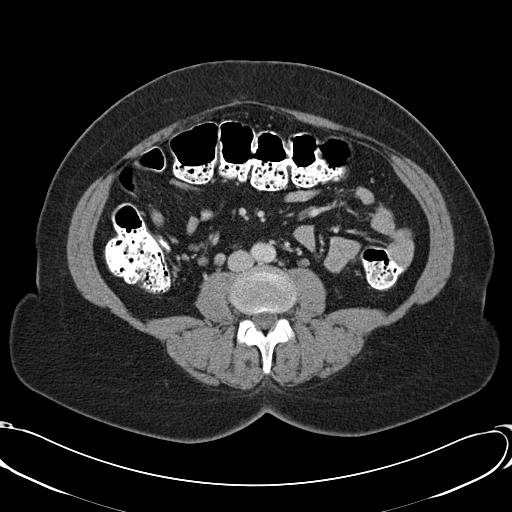
[im 51/87  soft-tissue]
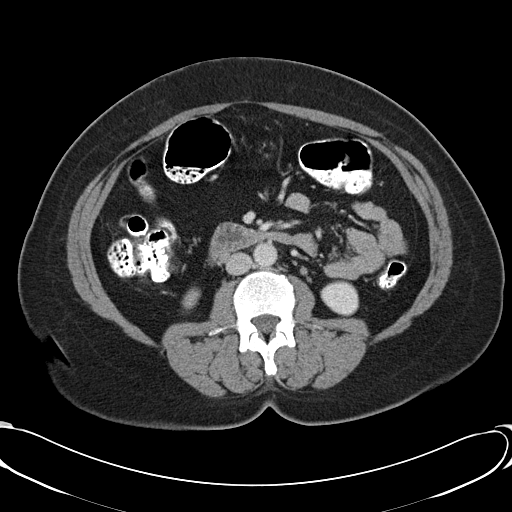
[im 51/87  bone]
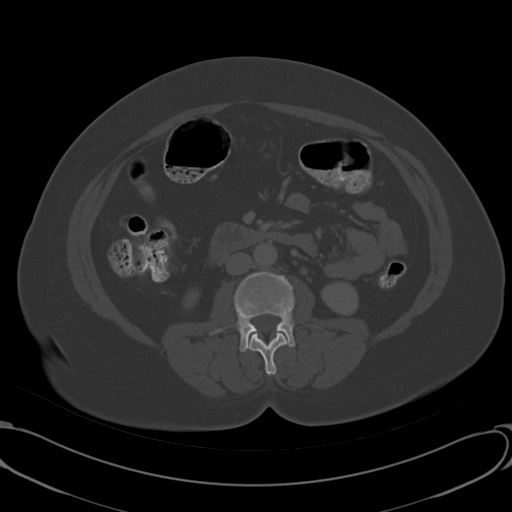
[im 56/87  soft-tissue]
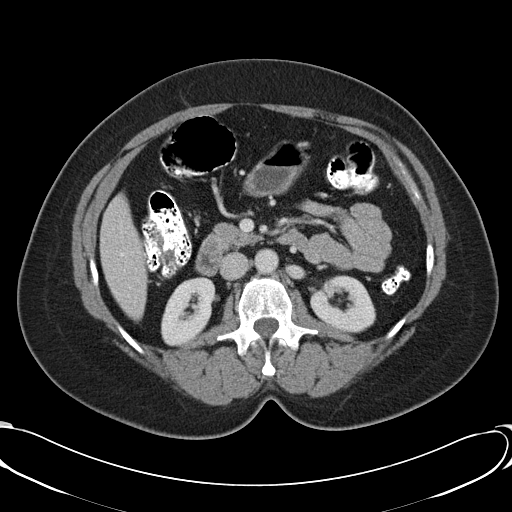
[im 66/87  soft-tissue]
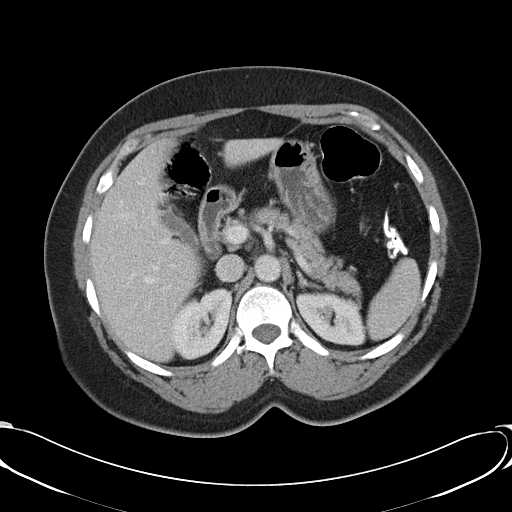
[im 71/87  soft-tissue]
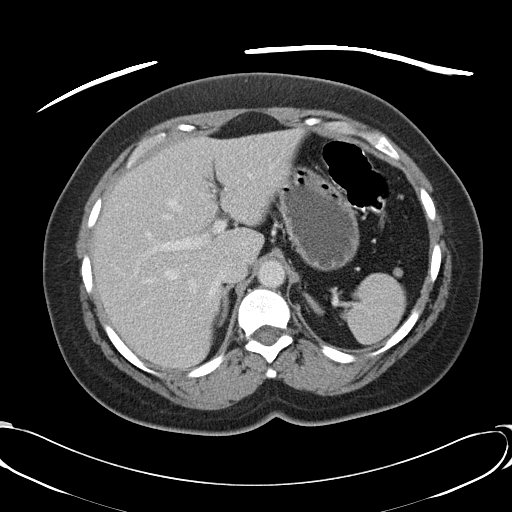
[im 76/87  soft-tissue]
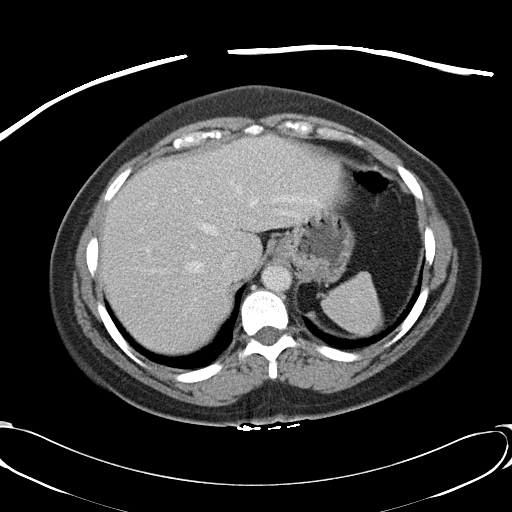
[im 81/87  soft-tissue]
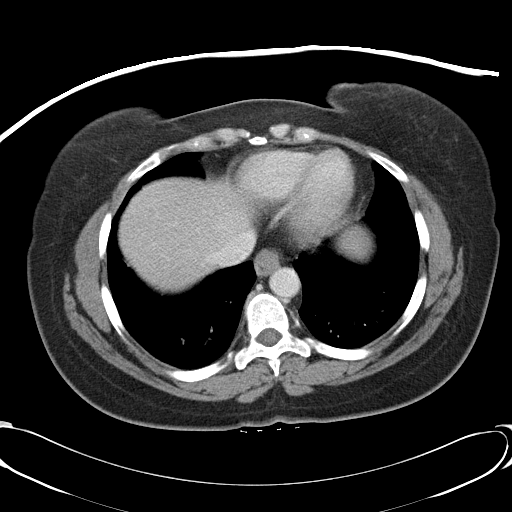

[Series 602: <mpr thick range> · coronal · 0.85mm/px · 3 of 93 slices shown]
[im 31/93  soft-tissue]
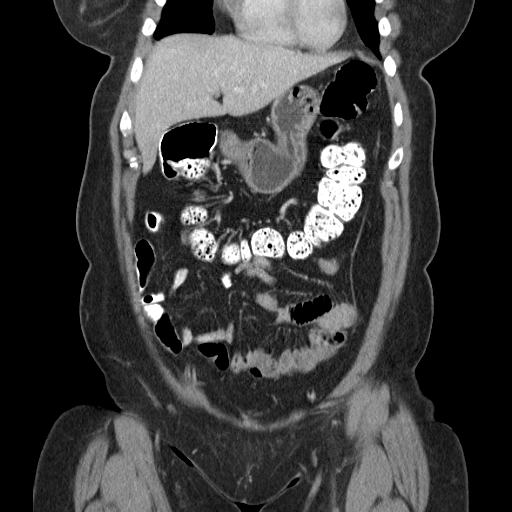
[im 41/93  soft-tissue]
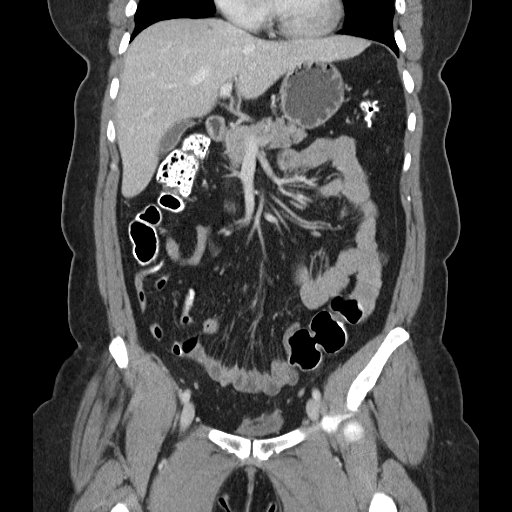
[im 52/93  soft-tissue]
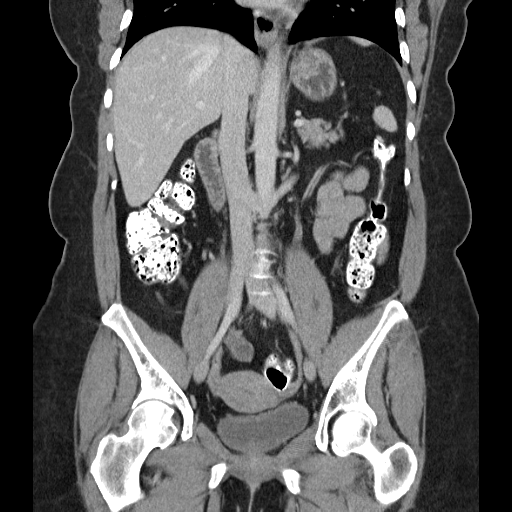

[17 of 46 positions shown; findings below may reference images not displayed]

FINDINGS: Clear lung bases.  Borderline cardiomegaly.  Tiny hiatal
hernia. No pericardial or pleural effusion.  The liver lesion
described on the prior exam is similar in the hepatic dome
approximately 4 mm.  There may be a second hepatic dome lesion in
the left lobe on image 11.  Similar on image 42 of the prior exam.
Possibly artifactual and due to volume averaging.  No suspicious
liver lesion.  Small splenule.  Normal stomach, pancreas,
gallbladder, biliary tract, adrenal glands, kidneys.  Retroaortic
left renal vein. No retroperitoneal or retrocrural adenopathy.

Surgical changes at the rectum.  Similar postoperative presacral
fascial thickening without evidence of locally recurrent disease.
Normal terminal ileum and appendix.  Normal small bowel without
ascites.

  No pelvic adenopathy.    Normal urinary bladder and uterus.  No
adnexal mass.  No significant free fluid.  No acute osseous
abnormality.
IMPRESSION: 1. No acute process or evidence of metastatic disease in the
abdomen or pelvis.
[DATE] and possibly 2 stable tiny liver lesions.  Most likely tiny
cysts or hemangiomas.
3.  Surgical changes of the rectum with stable postoperative
presacral fascial thickening.

## 2009-04-26 IMAGING — CR DG CHEST 2V
2 series · 2 of 2 positions shown · non-contrast
Comparison: [DATE]

CLINICAL DATA: Rectal carcinoma.

CHEST - 2 VIEW

[w chest pa]
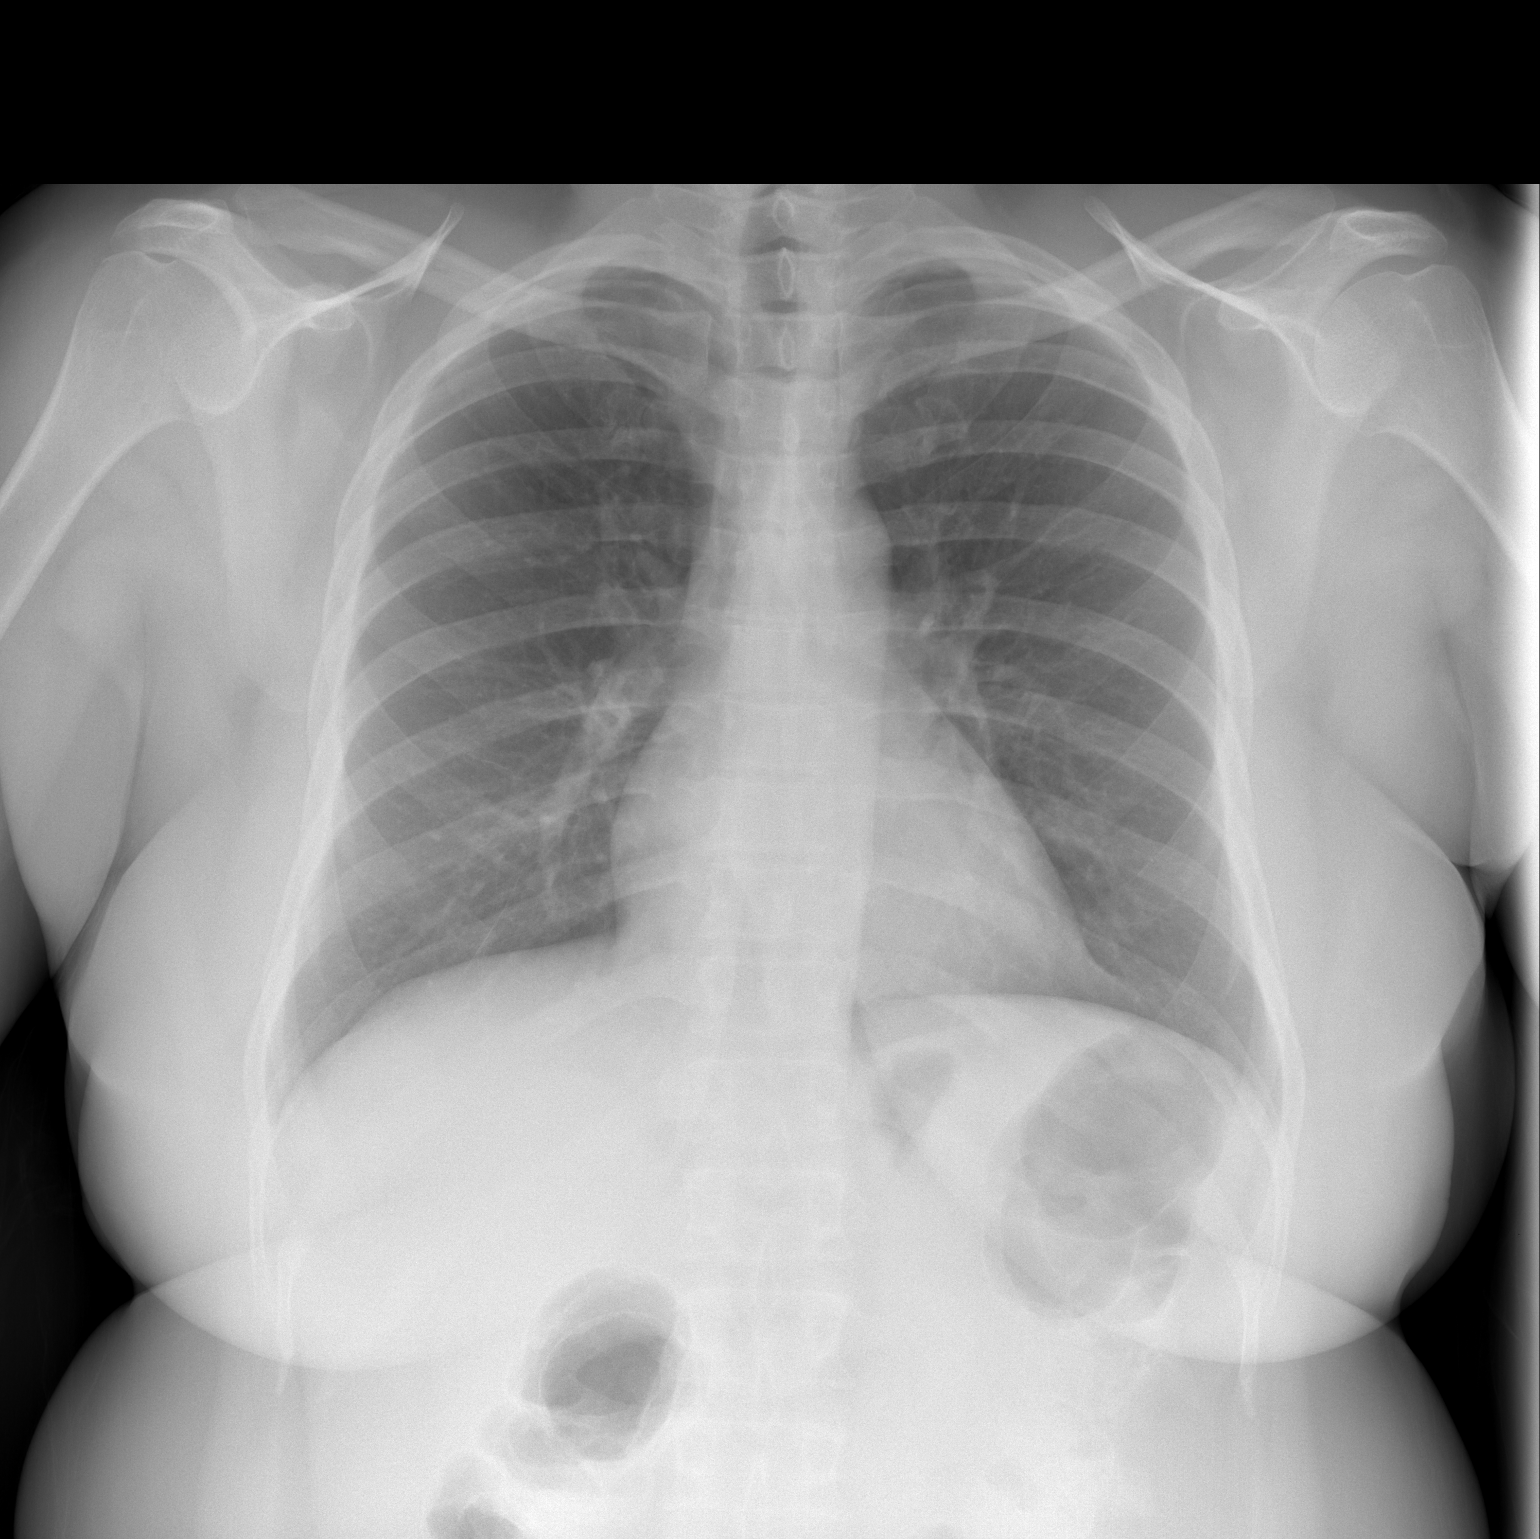

[w chest lat]
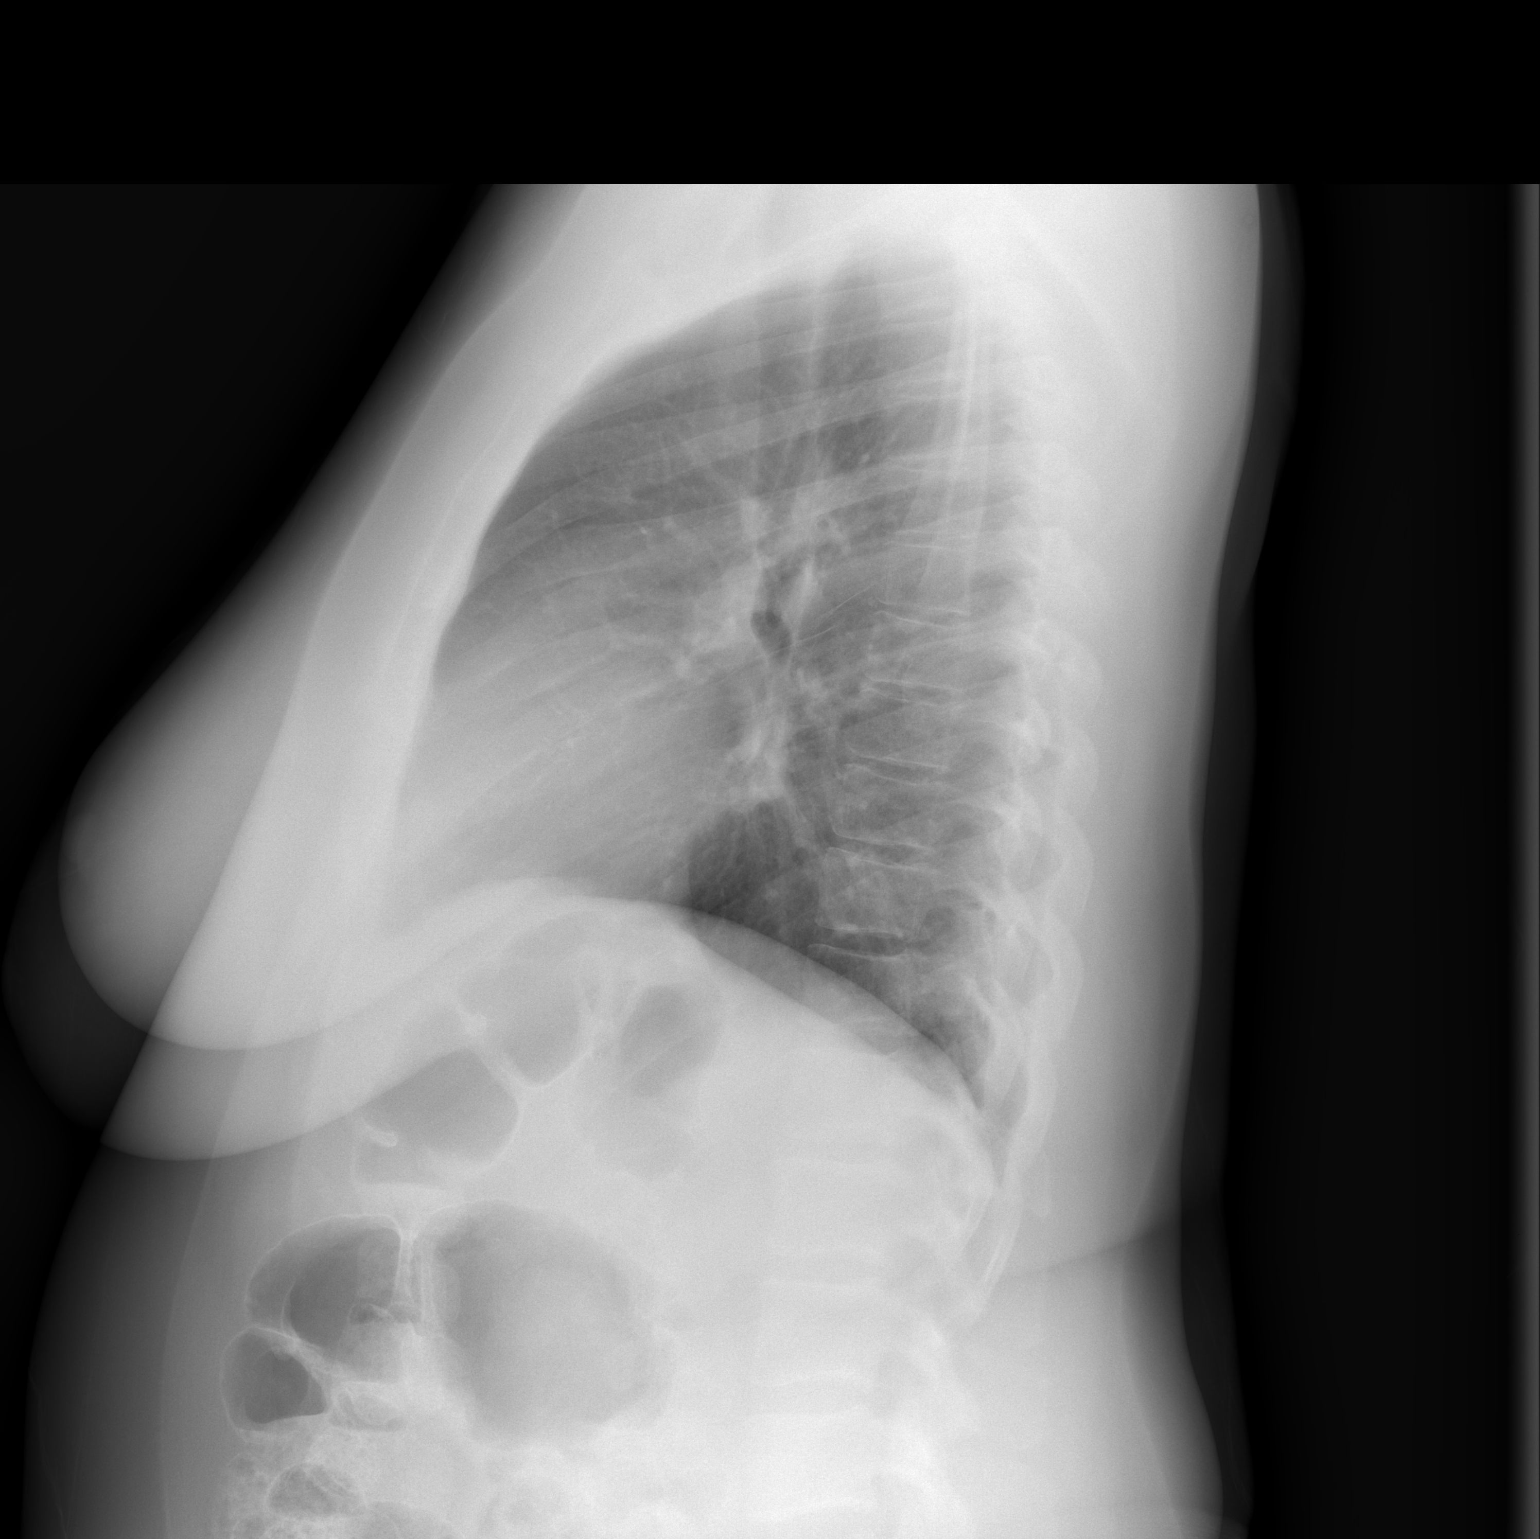

[2 of 2 positions shown; findings below may reference images not displayed]

FINDINGS: Port-A-Cath is been removed since prior study.  Heart
size and mediastinal contours are normal.  Both lungs are clear.
No mass or adenopathy identified.  No evidence of pleural effusion.
IMPRESSION: No active cardiopulmonary disease.

## 2009-05-03 ENCOUNTER — Encounter: Admission: RE | Admit: 2009-05-03 | Discharge: 2009-05-03 | Payer: Self-pay | Admitting: Hematology and Oncology

## 2009-05-03 IMAGING — MG MM DIGITAL SCREENING
4 series · 4 of 4 positions shown · non-contrast
Comparison: none

DG SCREEN MAMMOGRAM BILATERAL
Bilateral CC and MLO view(s) were taken.

DIGITAL SCREENING MAMMOGRAM WITH CAD:
There are scattered fibroglandular densities.  No masses or malignant type calcifications are 
identified.  Compared with prior studies.
Images were processed with CAD.

[R CC]
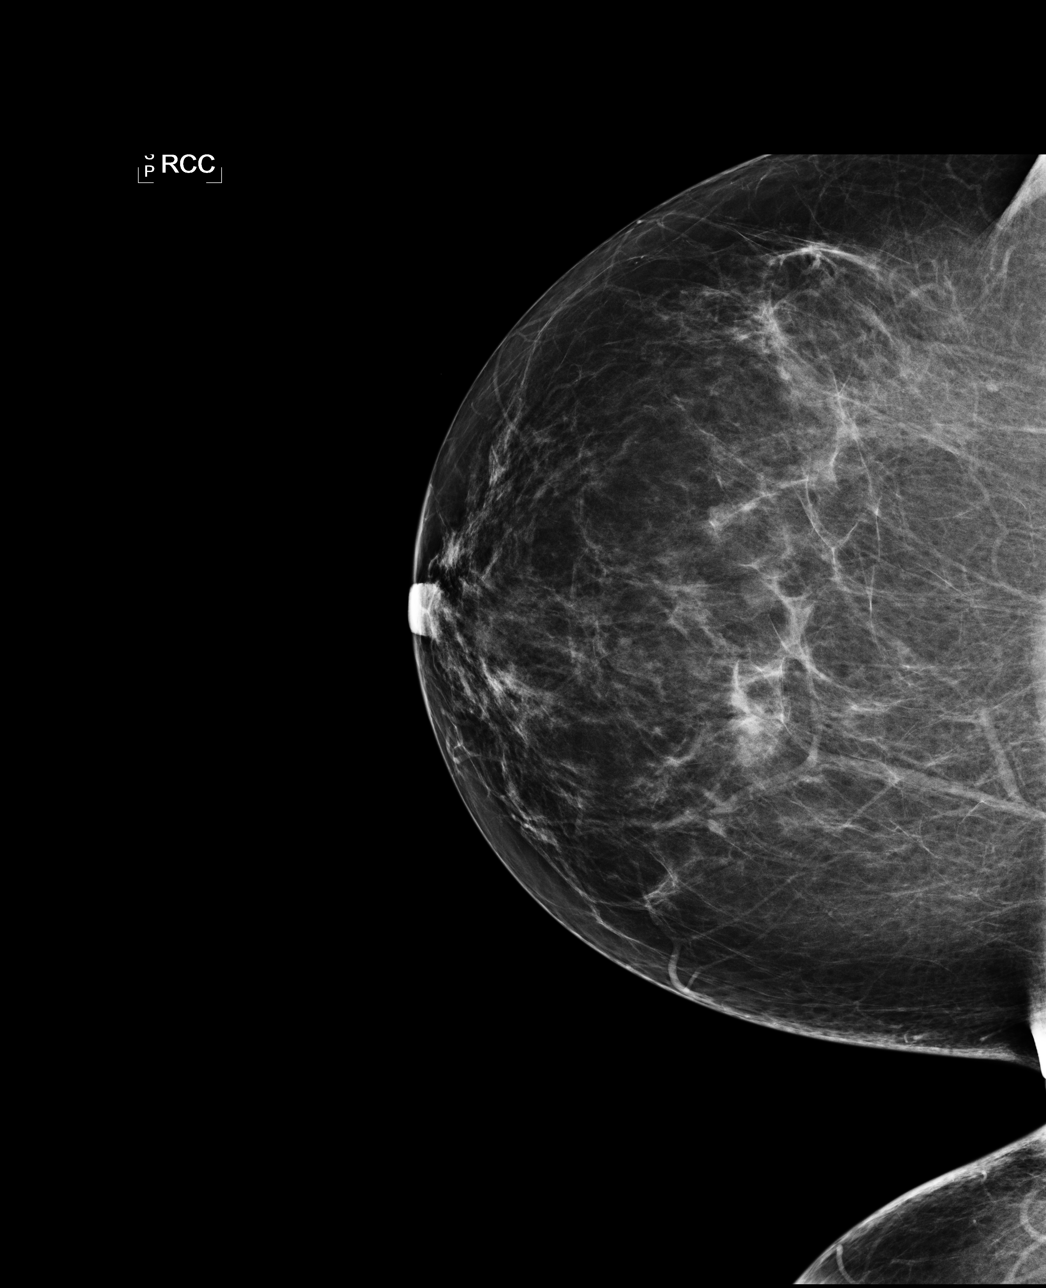

[L CC]
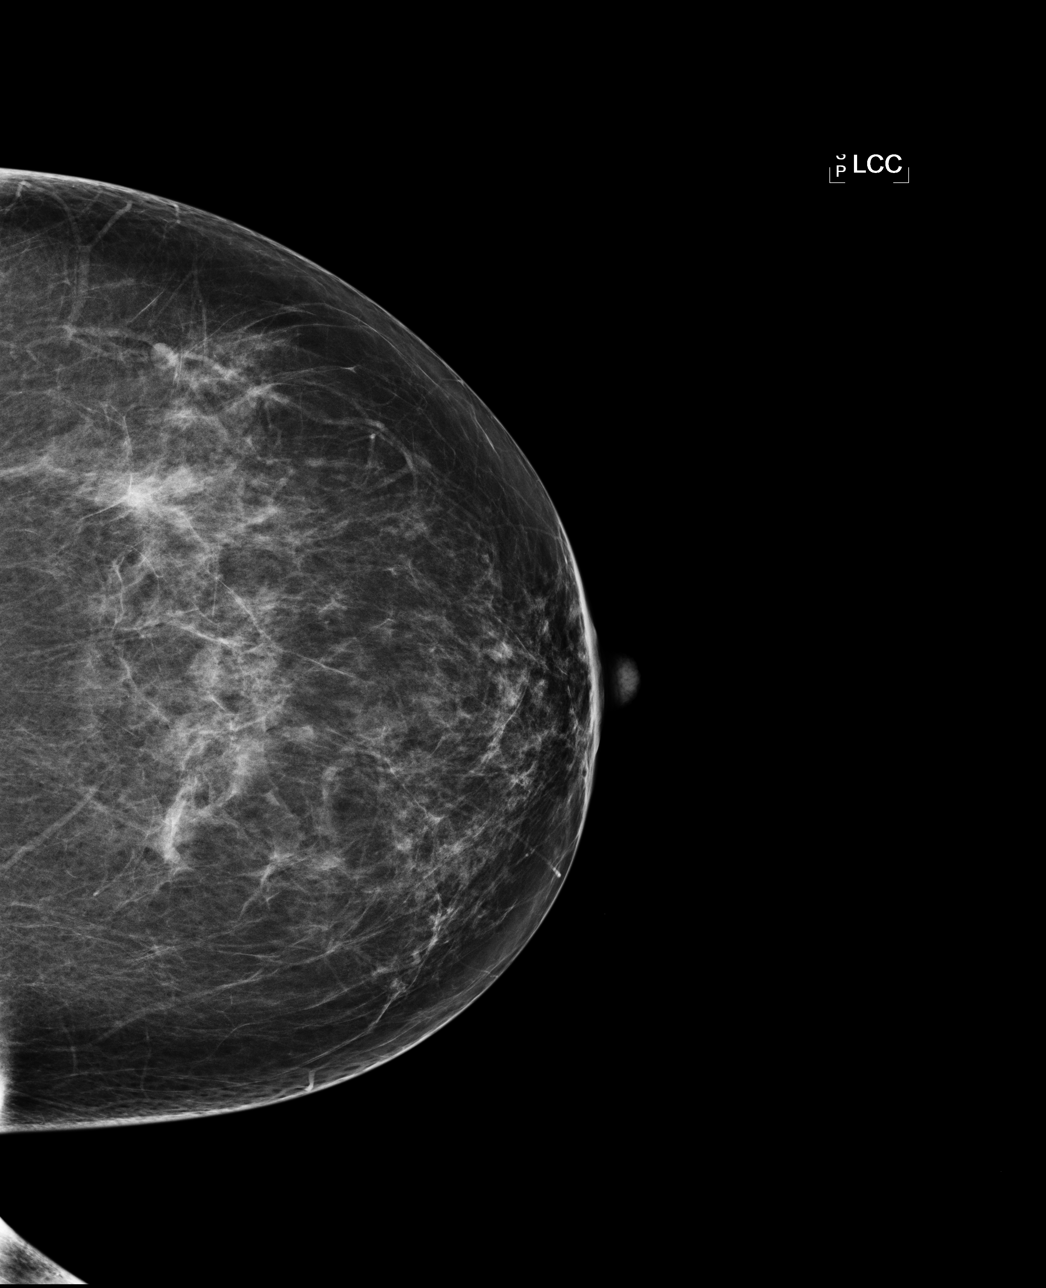

[L MLO]
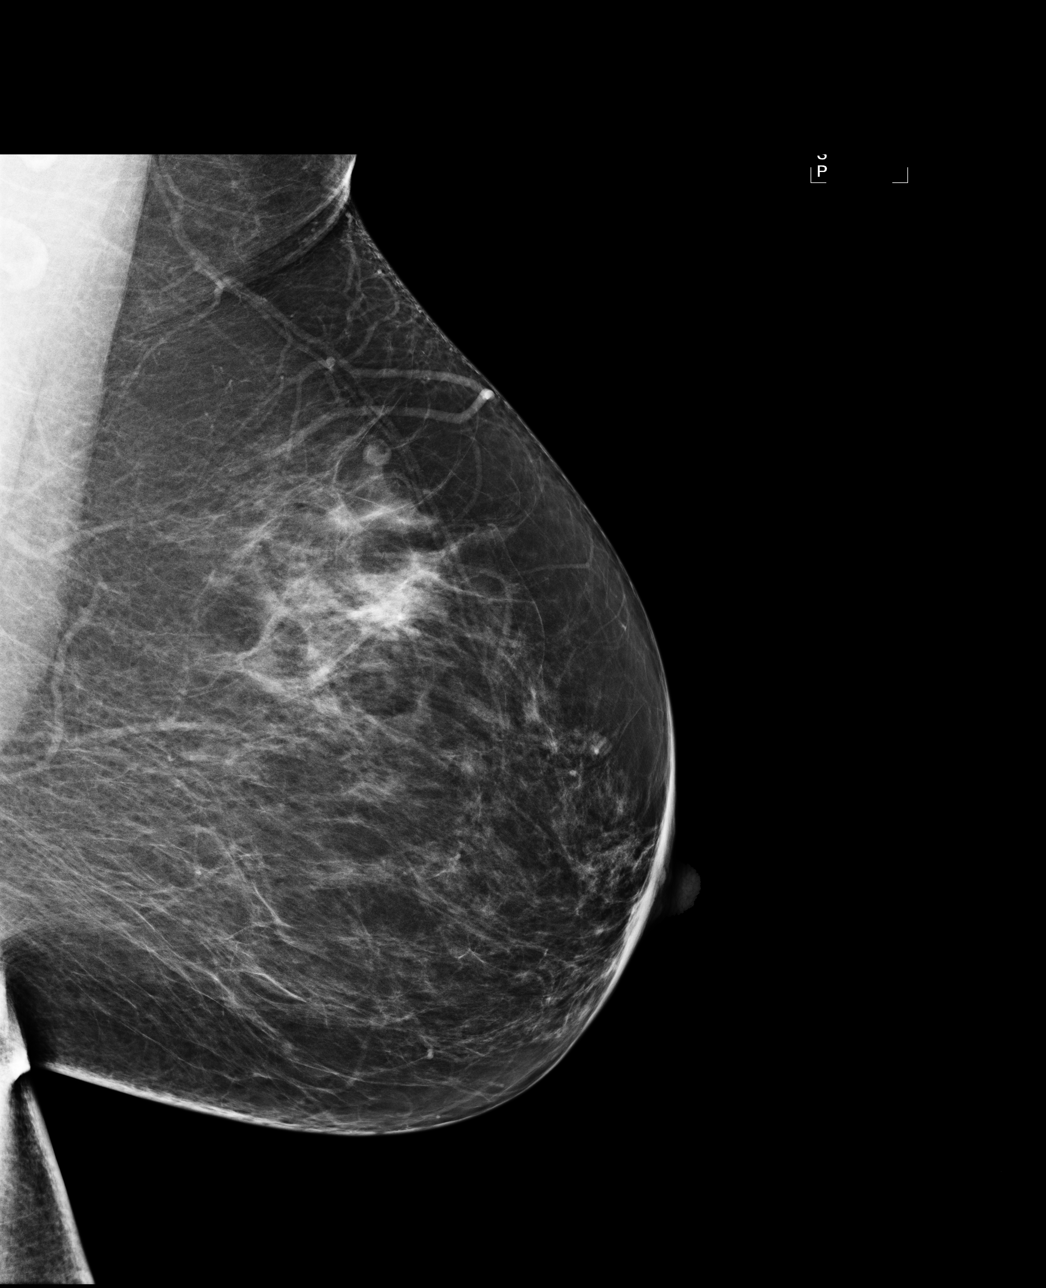

[R MLO]
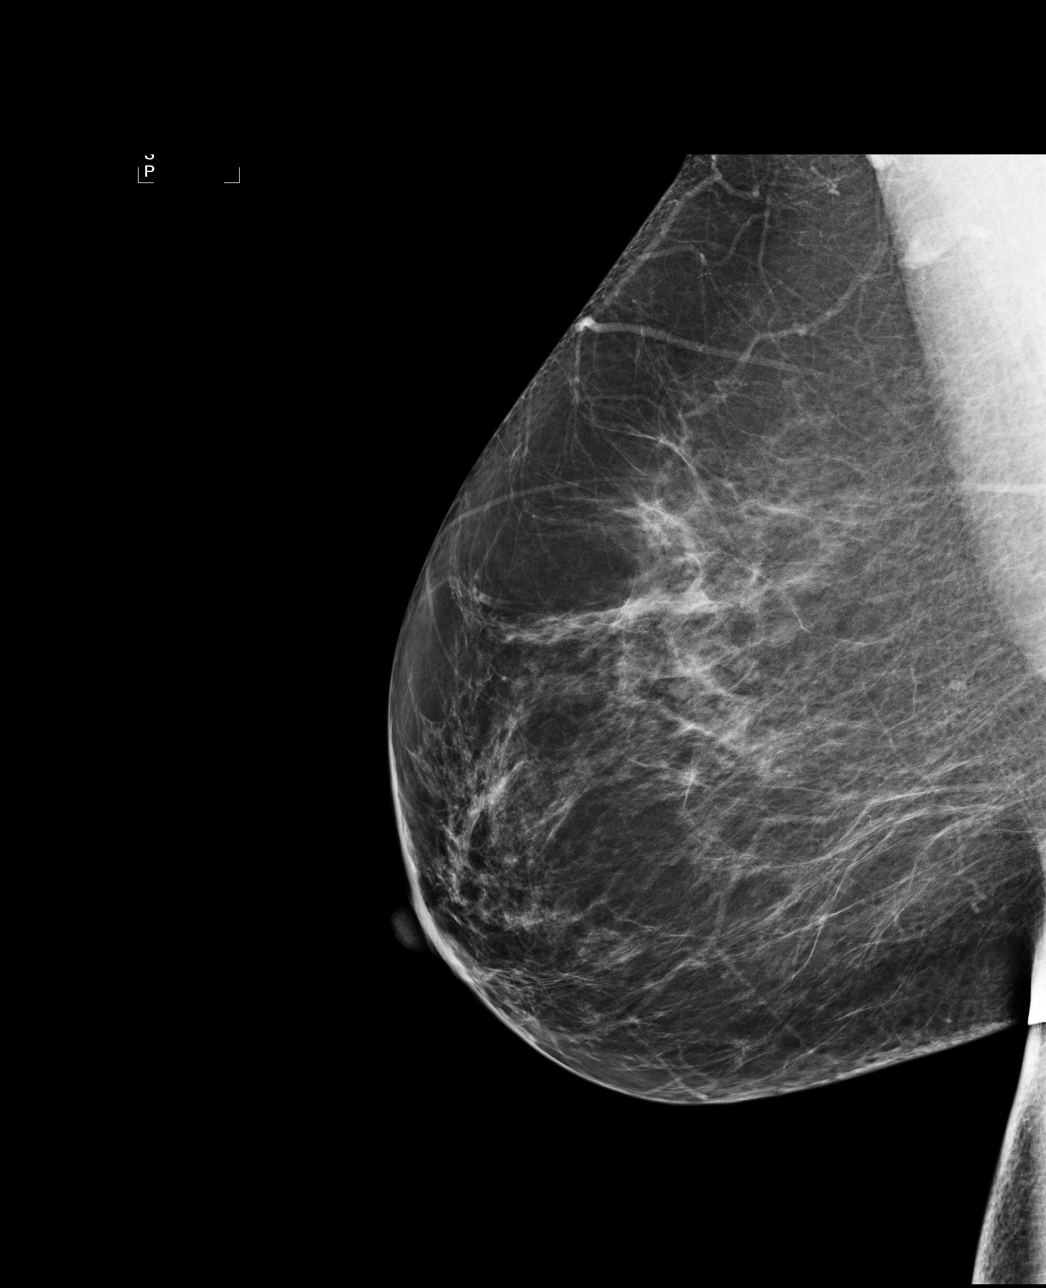

[4 of 4 positions shown; findings below may reference images not displayed]

IMPRESSION: No specific mammographic evidence of malignancy.  Next screening mammogram is recommended in one 
year.

A result letter of this screening mammogram will be mailed directly to the patient.

ASSESSMENT: Negative - BI-RADS 1

Screening mammogram in 1 year.
,

## 2009-11-30 ENCOUNTER — Ambulatory Visit: Payer: Self-pay | Admitting: Family Medicine

## 2009-12-09 ENCOUNTER — Ambulatory Visit: Payer: Self-pay | Admitting: Family Medicine

## 2010-01-17 ENCOUNTER — Ambulatory Visit: Payer: Self-pay | Admitting: Physician Assistant

## 2010-01-19 ENCOUNTER — Ambulatory Visit: Payer: Self-pay | Admitting: Hematology and Oncology

## 2010-01-23 LAB — COMPREHENSIVE METABOLIC PANEL
Albumin: 3.9 g/dL (ref 3.5–5.2)
Calcium: 8.9 mg/dL (ref 8.4–10.5)
Chloride: 109 mEq/L (ref 96–112)
Creatinine, Ser: 0.98 mg/dL (ref 0.40–1.20)
Glucose, Bld: 124 mg/dL — ABNORMAL HIGH (ref 70–99)
Potassium: 4 mEq/L (ref 3.5–5.3)
Sodium: 142 mEq/L (ref 135–145)
Total Bilirubin: 0.3 mg/dL (ref 0.3–1.2)
Total Protein: 6.5 g/dL (ref 6.0–8.3)

## 2010-01-23 LAB — CBC WITH DIFFERENTIAL/PLATELET
BASO%: 0.5 % (ref 0.0–2.0)
Basophils Absolute: 0 10*3/uL (ref 0.0–0.1)
HCT: 34.5 % — ABNORMAL LOW (ref 34.8–46.6)
HGB: 11.3 g/dL — ABNORMAL LOW (ref 11.6–15.9)
Platelets: 237 10*3/uL (ref 145–400)
RDW: 15.4 % — ABNORMAL HIGH (ref 11.2–14.5)
WBC: 4.7 10*3/uL (ref 3.9–10.3)

## 2010-03-25 ENCOUNTER — Other Ambulatory Visit: Payer: Self-pay | Admitting: Hematology and Oncology

## 2010-03-25 DIAGNOSIS — C20 Malignant neoplasm of rectum: Secondary | ICD-10-CM

## 2010-03-26 ENCOUNTER — Encounter: Payer: Self-pay | Admitting: Family Medicine

## 2010-03-26 ENCOUNTER — Encounter: Payer: Self-pay | Admitting: Hematology and Oncology

## 2010-07-12 ENCOUNTER — Telehealth: Payer: Self-pay | Admitting: *Deleted

## 2010-07-12 ENCOUNTER — Ambulatory Visit (AMBULATORY_SURGERY_CENTER): Payer: Self-pay | Admitting: *Deleted

## 2010-07-12 VITALS — Ht 60.0 in | Wt 191.4 lb

## 2010-07-12 DIAGNOSIS — Z1211 Encounter for screening for malignant neoplasm of colon: Secondary | ICD-10-CM

## 2010-07-12 MED ORDER — PEG-KCL-NACL-NASULF-NA ASC-C 100 G PO SOLR: ORAL | Status: DC

## 2010-07-12 NOTE — Telephone Encounter (Signed)
Hx of colon cancer 5 yrs. Ago at time Dr.Hung was her GI dr.She signed release of records at his office on Monday.Please f/u to see if they have arrived and been reviewed by Dr.KapalnS-Pt. Check her allergies and update as she said she went into cardiac arrest from rxn. To either Lidocaine or Epinephrine.

## 2010-07-12 NOTE — Progress Notes (Signed)
Pt.had colon cancer 5 yrs. ago and her G.I. Dr.at that time was Dr.Hung.She signed a release of records at his office on this past Monday.Please f/u to see if they have arrived and been reviewed by Dr [name].She said she went into cardiac arrest on either lidocaine or Epinephrine so please verify in records this info and update which one it wasColonoscopy scheduled for 07/26/10.Dr.Odogwu is her cancer Dr. And she had surgery,chemo and radiation and should be included in Dr.Hung's records.

## 2010-07-13 ENCOUNTER — Telehealth: Payer: Self-pay | Admitting: *Deleted

## 2010-07-13 NOTE — Telephone Encounter (Signed)
Pt. Signed record release on Monday at Dr.Hung's office please f/u if rec'd and check allergies and update as she said she had cardiac arrest with either Lidocaine or epinephrine during surgery

## 2010-07-14 ENCOUNTER — Other Ambulatory Visit: Payer: Self-pay | Admitting: Hematology and Oncology

## 2010-07-14 DIAGNOSIS — Z1231 Encounter for screening mammogram for malignant neoplasm of breast: Secondary | ICD-10-CM

## 2010-07-20 ENCOUNTER — Ambulatory Visit
Admission: RE | Admit: 2010-07-20 | Discharge: 2010-07-20 | Disposition: A | Discharge status: Home / Self Care | Payer: BC Managed Care – PPO | Source: Ambulatory Visit | Attending: Hematology and Oncology | Admitting: Hematology and Oncology

## 2010-07-20 DIAGNOSIS — Z1231 Encounter for screening mammogram for malignant neoplasm of breast: Secondary | ICD-10-CM

## 2010-07-21 ENCOUNTER — Encounter: Payer: Self-pay | Admitting: Gastroenterology

## 2010-07-21 NOTE — Group Therapy Note (Signed)
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  On examination, the patient's  abdomen was soft, flat, nontender.  No masses, no organomegaly.  External  genitalia was normal.  The vagina was clean and well rugated.  Cervix was  clean and parous.  Uterus was of normal size, shape, consistency, and the  adnexa could not be well outlined but did not find any enlargement of the  cervix nor the uterus.   IMPRESSION:  Normal gynecological examination.                                               Argentina Donovan, MD    PR/MEDQ  D:  10/28/2003  T:  10/28/2003  Job:  [id number]

## 2010-07-21 NOTE — Consult Note (Signed)
NAME:  [name] [name], [name] [name]            ACCOUNT NO.:  [id number]   MEDICAL RECORD NO.:  [id number]          PATIENT TYPE:  INP   LOCATION:  1419                         FACILITY:  [facility]   PHYSICIAN:  Excell Seltzer. Annabell Howells, M.D.    DATE OF BIRTH:  Feb 16, 1961   DATE OF CONSULTATION:  10/22/2005  DATE OF DISCHARGE:                                   CONSULTATION   UROLOGY CONSULTATION   CHIEF COMPLAINT:  Urinary retention.   HISTORY OF PRESENT ILLNESS:  [name] is a 50 year old black female who  was admitted for resection of a rectal cancer.  She underwent a low anterior  resection and excision of bilateral fallopian tube cysts on October 16, 2005.  Postoperatively a Foley catheter was left indwelling.  The Foley catheter  was initially removed on October 19, 2005 but the patient failed to void and  was found to have residual of 800 cc.  She had been on intermittent catheter  and had actually received Urecholine but had been only able to void a small  amount with dribbling and some overflow type symptoms.  A Foley catheter is  now in place.  The patient reports no significant urologic history.  She  denied any obstructive or irritative voiding symptoms prior to the  procedure.  She has had no recent urinary infections or genitourinary  surgery.   PAST MEDICAL HISTORY:  Her medical history is significant for prior Cesarean  section.  She had a septoplasty in 2002.  This was originally scheduled in  1997 but had cardiac arrest with Lidocaine, epinephrine solution.  She has  some chronic history of anemia.  She has gastroesophageal reflux.   ALLERGIES:  Her past history is significant for ALLERGY TO AN ANESTHETIC  AGENT.  She is not sure what it is. She gets yeast infection with  AMOXICILLIN.   CURRENT MEDICATIONS:  Include Lovenox, Aricept, iron, bethanechol, Protonix  and pain medication.   SOCIAL HISTORY:  She drinks rare alcohol.  She quit smoking a year or two  ago.  She was a light smoker  prior.   FAMILY HISTORY:  Unremarkable.   REVIEW OF SYSTEMS:  Since her catheter removal she had some just dribbling  voids and overflow type incontinence.  She seemed to have normal bladder  sensation.  She is currently complaining of some constipation.  She is  otherwise without complaints.   PHYSICAL EXAMINATION:  GENERAL APPEARANCE:  She is a well-nourished, well-  developed black female in no acute distress.  She is alert and oriented x3.  ABDOMEN:  Soft.  Incision is reportedly clean, intact.  She has positive  bowel sounds, no masses.  GENITOURINARY/RECTAL:  Not done at this time due to her recent surgery.   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   RECOMMENDATIONS:  At this point would leave the Foley catheter indwelling.  I think we can stop the Urecholine for now.  She can go home with the  catheter in and followup with me in a week or two for a voiding trial.  I  have  explained to her that we will need to be patient, that it may take some  time for her to recover function and she need urodynamics at some point as  well.      Excell Seltzer. Annabell Howells, M.D.  Electronically Signed     JJW/MEDQ  D:  10/22/2005  T:  10/23/2005  Job:  [id number]

## 2010-07-21 NOTE — Telephone Encounter (Signed)
I HAVE SENT MESSAGE FROM A [facility] REGARDING THIS PTS ALLERGIES. TO DIXIE AND DR [name].HAVE NOT SEEN ANY RECORDS TO CONFIRM THIS PTS ALLERGIES....  PT ALLERGIC TO EITHER LIDOCAINE OR EPINEPHRINAE, CAUSES CARDIAC ARREST,

## 2010-07-21 NOTE — Op Note (Signed)
[name] [name], PLOUFFE            ACCOUNT NO.:  [id number]   MEDICAL RECORD NO.:  [id number]          PATIENT TYPE:  INP   LOCATION:  1419                         FACILITY:  [facility]   PHYSICIAN:  Adolph Pollack, M.D.DATE OF BIRTH:  [birth year]   DATE OF PROCEDURE:  11/20/2005  DATE OF DISCHARGE:                                 OPERATIVE REPORT   PREOPERATIVE DIAGNOSIS:  Rectal cancer.   POSTOPERATIVE DIAGNOSIS:  Rectal cancer.   PROCEDURE:  Insertion of Port-A-Cath into left subclavian vein.   SURGEON:  Adolph Pollack, M.D.   ANESTHESIA:  General.   INDICATIONS:  This is a 50 year old female who has stage III rectal cancer.  She needs long-term venous access for chemotherapy.  She now presents for  Port-A-Cath insertion.  We have discussed the procedure and risks (including  but not limited to bleeding, infection, catheter malfunction, DVT,  pneumothorax) with her.   TECHNIQUE:  She was seen in the holding area and then brought to the  operating room and placed supine on the operating table and given a general  anesthetic.  A roll was then placed under her back, and her neck and upper  chest wall were sterilely prepped and draped.  A local anesthetic was not  used because of a severe reaction she had to it in the past.  Using a 16-  gauge needle, the left subclavian vein was cannulated.  A wire was placed  through the needle and into the superior vena cava, verified by fluoroscopic  guidance.  The needle was removed.  Incision was made in the left upper  chest wall through skin and subcutaneous tissue, and using blunt dissection  electrocautery, a pocket was created for the port.  A tunnel was made after  making an incision around the wire from the subclavian incision to the chest  wall incision and a catheter passed up retrograde from the chest wall  incision up to the subclavian incision.  The track was then dilated, and the  dilator introducer complex was placed into  the vein.  The wire and dilator  were removed, and a catheter was then placed through the peel-away sheath.  The peel-away sheath was removed.  Under fluoroscopic guidance, I pulled the  catheter back until the tip was near the superior vena cava/atrium junction.  I then cut the catheter and attached the port.  Port was anchored to the  chest wall with interrupted 2-0 Vicryl suture.  I then aspirated blood and  flushed the port with heparinized saline.   Following this, the subcutaneous tissue was closed over the port with  running 3-0 Vicryl suture.  Both skin incisions were closed with 4-0  Monocryl subcuticular stitches.  Port was then accessed and flushed with  concentrated heparin solution.  Steri-Strips and a sterile dressing were  applied.   She tolerated the procedure without any apparent complications.  She was  taken to recovery room where a stat portable chest x-ray was pending.  I  have given her discharge instructions.  She will take Coumadin 1 mg a day  and be given Vicodin for  pain.  There were no apparent complications.      Adolph Pollack, M.D.  Electronically Signed     TJR/MEDQ  D:  11/20/2005  T:  11/21/2005  Job:  [id number]

## 2010-07-21 NOTE — Discharge Summary (Signed)
[name], [name]            ACCOUNT NO.:  [id number]   MEDICAL RECORD NO.:  [id number]          PATIENT TYPE:  INP   LOCATION:  1419                         FACILITY:  [facility]   PHYSICIAN:  Adolph Pollack, M.D.DATE OF BIRTH:  1960/09/05   DATE OF ADMISSION:  10/16/2005  DATE OF DISCHARGE:  10/23/2005                                 DISCHARGE SUMMARY   PRINCIPLE DISCHARGE DIAGNOSIS:  Stage 3 rectal cancer.   SECONDARY DIAGNOSES:  1. Gastroesophageal reflux disease.  2. Chronic anemia.  3. Urinary retention.  4. Bilateral fallopian tube cyst.   PROCEDURE:  Low anterior section of the rectum and excision of bilateral  fallopian tube cyst.   INDICATION:  This is a 50 year old female who was noted to have some blood  in the stool and low grade anemia.  A colonoscopy demonstrated a polyp in  the mid-rectum and it was positive for adenocarcinoma.  She was admitted for  elective resection.  CT scan and liver function tests did not demonstrate  and obvious metastases.   HOSPITAL COURSE:  She underwent the above procedure and tolerated it well.  She did have some acute non-chronic anemia with her lowest hemoglobin being  at 8.  Her diet was able to be advanced and a pathology came back stage 3  rectal cancer with 1 of 19 lymph nodes positive.  I tried to remove her  Foley, however, she had difficulty voiding and the Foley had to be placed  back in.  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  On her seventh day she was tolerating a diet, passing  gas and having bowel movement.  She was feeling better.  The staples were  removed and she was able to be discharged.   DISPOSITION:  Discharged to home on October 23, 2005.  She has a Foley  catheter in and she is to Dr. Annabell Howells in 1-2 weeks for voiding trial.  She  will come back to see me in two weeks.  We will arrange  for an outpatient  oncology appointment at that time.  She was given Tylox for pain.      Adolph Pollack, M.D.  Electronically Signed     TJR/MEDQ  D:  11/06/2005  T:  11/06/2005  Job:  [id number]

## 2010-07-21 NOTE — H&P (Signed)
[name], [name]            ACCOUNT NO.:  [id number]   MEDICAL RECORD NO.:  [id number]          PATIENT TYPE:  INP   LOCATION:  0098                         [facility]ACILITY:  [facility]   PHYSICIAN:  Adolph Pollack, M.D.DATE OF BIRTH:  [birth year]   DATE OF ADMISSION:  11/20/2005  DATE OF DISCHARGE:                                HISTORY & PHYSICAL   REASON FOR ADMISSION:  Acute post anesthesia flash pulmonary edema.   HISTORY OF PRESENT ILLNESS:  [name] [name] is a 50 year old female, who had a  low anterior resection for stage III rectal cancer.  She had a Port-A-Cath  inserted today uneventfully, but apparently upon reversal of general  anesthesia (she had a severe life-threatening reaction to local anesthetic  and thus this was not used), she bit down on the LMA and then developed a  productive cough with blood-tinged sputum and coarse-sounding lungs.  A  chest x-ray was performed consistent with acute pulmonary edema.  She was  initially given Lasix IV, and I was called by the anesthesiologist stating  that she would need to be admitted for observation.  Currently, she denies  any respiratory distress and does not appear to be in respiratory distress  at this time.   PAST MEDICAL HISTORY:  1. Rectal cancer.  2. Anemia.  3. Cardiac arrest after Lidocaine.  4. Gastroesophageal reflux.  5. Postoperative urinary retention.   PAST SURGICAL HISTORY:  1. Low anterior resection.  2. Nasal surgery.  3. Cesarean section.   ALLERGIES:  None.   HOME MEDICATIONS:  None.   SOCIAL HISTORY:  Single mother.  Had smoked in the past.  No alcohol use.   PHYSICAL EXAMINATION:  GENERAL:  A well-developed, well-nourished female.  Has a face mask O2 on but does not appear to be in any acute distress.  VITAL SIGNS:  Her respiratory rate has varied between 22 to 28.  Pulse has  been anywhere from 80 to 110 to 115.  Blood pressure 101/61.  O2 SAT is 99%  on face mask oxygen  NECK:  Supple.  I do  not see any JVD.  CARDIOVASCULAR:  There is an increased rate with a regular rhythm.  LUNGS:  Coarse bilaterally.  ABDOMEN:  Soft, a well-healed lower midline scar.  CHEST:  There is a dressing in the left upper quadrant consistent with Port-  A-Cath insertion.  EXTREMITIES:  No edema.   Chest x-ray was reviewed demonstrating findings consistent with acute  vascular congestion bilaterally.   IMPRESSION:  Post anesthetic flash pulmonary edema.   PLAN:  Will admit to the hospital for diuresis, supplemental oxygen, and we  will repeat the chest x-ray in the morning.  This has been explained to her  and her family.      Adolph Pollack, M.D.  Electronically Signed     TJR/MEDQ  D:  11/20/2005  T:  11/20/2005  Job:  [id number]

## 2010-07-26 ENCOUNTER — Encounter: Payer: Self-pay | Admitting: Gastroenterology

## 2010-07-26 ENCOUNTER — Ambulatory Visit (AMBULATORY_SURGERY_CENTER): Payer: BC Managed Care – PPO | Admitting: Gastroenterology

## 2010-07-26 VITALS — BP 114/82 | HR 79 | Temp 97.3°F | Resp 18 | Ht 60.0 in | Wt 180.0 lb

## 2010-07-26 DIAGNOSIS — D126 Benign neoplasm of colon, unspecified: Secondary | ICD-10-CM

## 2010-07-26 DIAGNOSIS — Z1211 Encounter for screening for malignant neoplasm of colon: Secondary | ICD-10-CM

## 2010-07-26 DIAGNOSIS — Z85038 Personal history of other malignant neoplasm of large intestine: Secondary | ICD-10-CM

## 2010-07-26 MED ORDER — SODIUM CHLORIDE 0.9 % IV SOLN: 500.0000 mL | INTRAVENOUS | Status: DC

## 2010-07-26 NOTE — Patient Instructions (Signed)
Discharged instructions given with verbal understanding. Handouts on polyps given. Resume previous medications.

## 2010-07-27 ENCOUNTER — Telehealth: Payer: Self-pay | Admitting: *Deleted

## 2010-07-27 NOTE — Telephone Encounter (Signed)

## 2010-08-07 ENCOUNTER — Telehealth: Payer: Self-pay | Admitting: Gastroenterology

## 2010-08-07 NOTE — Telephone Encounter (Signed)
Reviewed letter with pt regarding polyp biopsy report. Pt verbalized understanding.

## 2010-09-12 ENCOUNTER — Inpatient Hospital Stay (INDEPENDENT_AMBULATORY_CARE_PROVIDER_SITE_OTHER)
Admission: RE | Admit: 2010-09-12 | Discharge: 2010-09-12 | Disposition: A | Discharge status: Home / Self Care | Payer: BC Managed Care – PPO | Source: Ambulatory Visit | Attending: Emergency Medicine | Admitting: Emergency Medicine

## 2010-09-12 ENCOUNTER — Ambulatory Visit (INDEPENDENT_AMBULATORY_CARE_PROVIDER_SITE_OTHER): Payer: BC Managed Care – PPO

## 2010-09-12 DIAGNOSIS — R059 Cough, unspecified: Secondary | ICD-10-CM

## 2010-09-12 DIAGNOSIS — R05 Cough: Secondary | ICD-10-CM

## 2010-09-12 DIAGNOSIS — R609 Edema, unspecified: Secondary | ICD-10-CM

## 2010-09-12 LAB — POCT I-STAT, CHEM 8
Calcium, Ion: 1.17 mmol/L (ref 1.12–1.32)
HCT: 40 % (ref 36.0–46.0)
Hemoglobin: 13.6 g/dL (ref 12.0–15.0)
Sodium: 140 mEq/L (ref 135–145)
TCO2: 27 mmol/L (ref 0–100)

## 2010-09-12 IMAGING — CR DG CHEST 2V
2 series · 2 of 2 positions shown · non-contrast
Comparison: [DATE].

CLINICAL DATA: Cough.  Swelling.  Bilateral foot swelling.  History
of colon cancer.

CHEST - 2 VIEW

[view not recorded (1 of 2)]
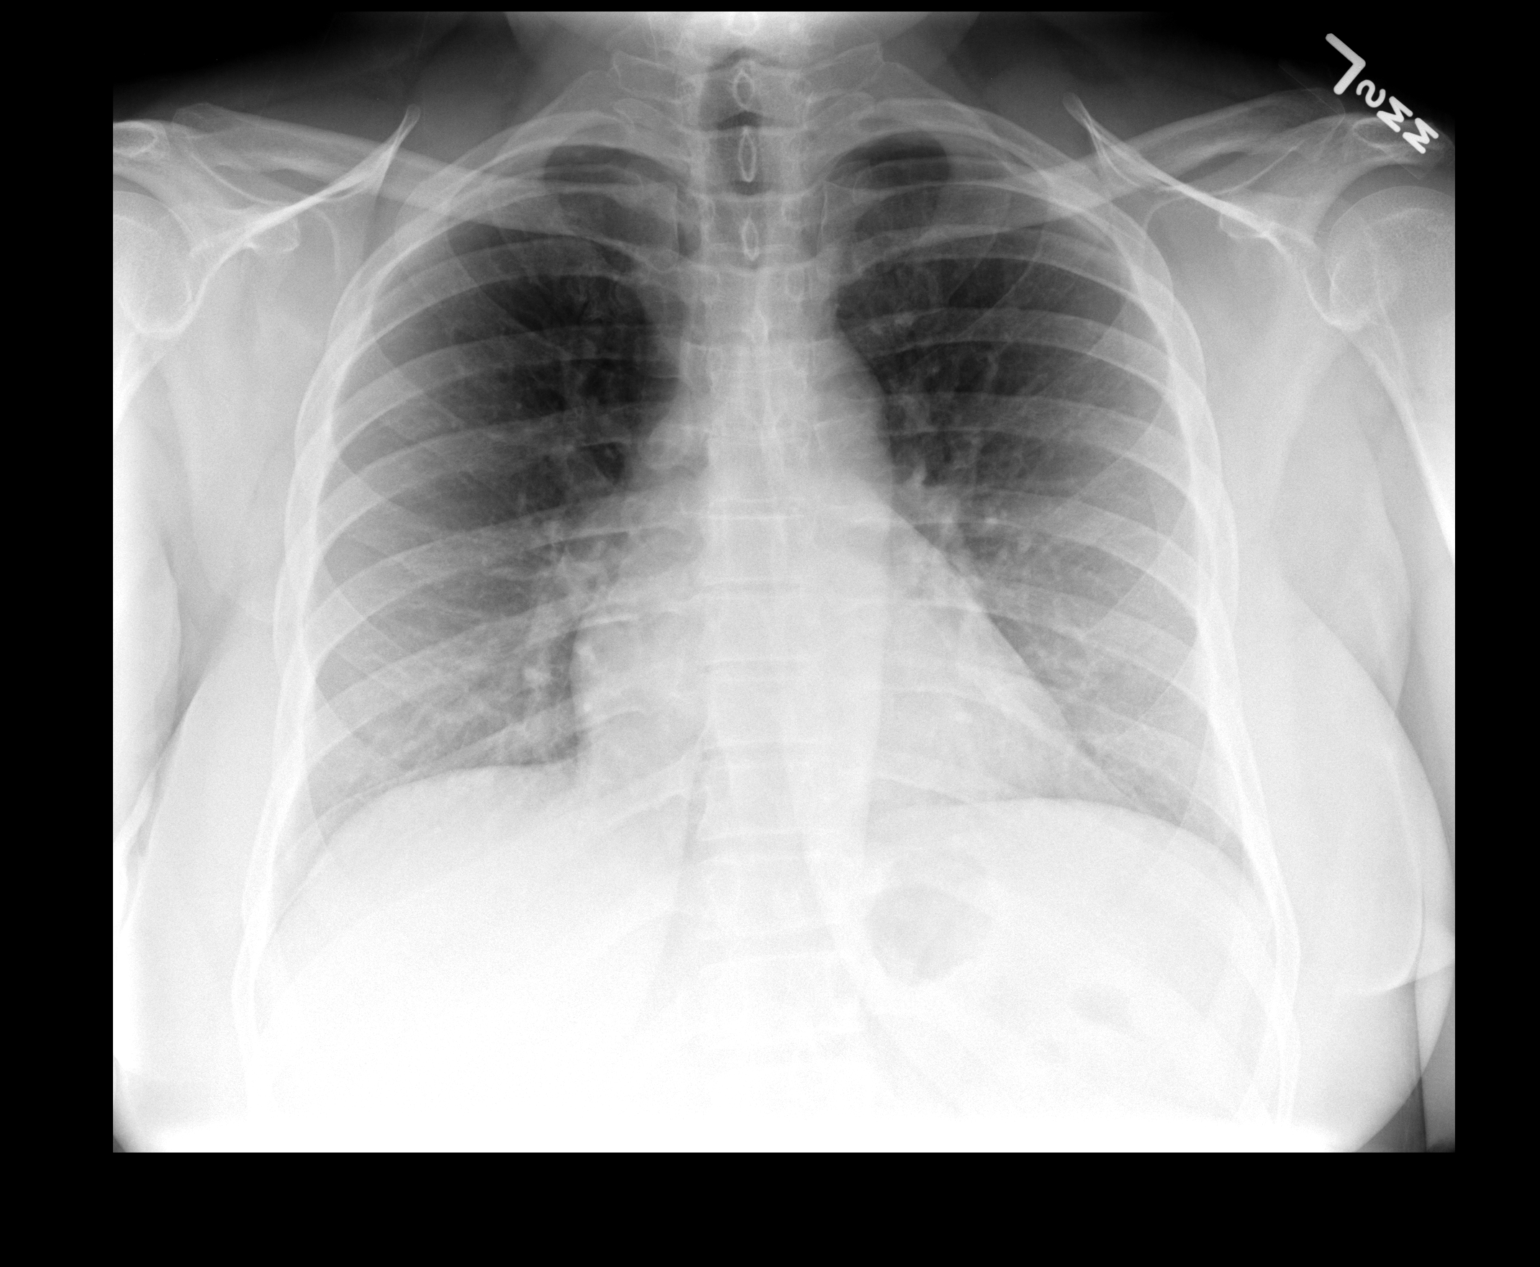

[view not recorded (2 of 2)]
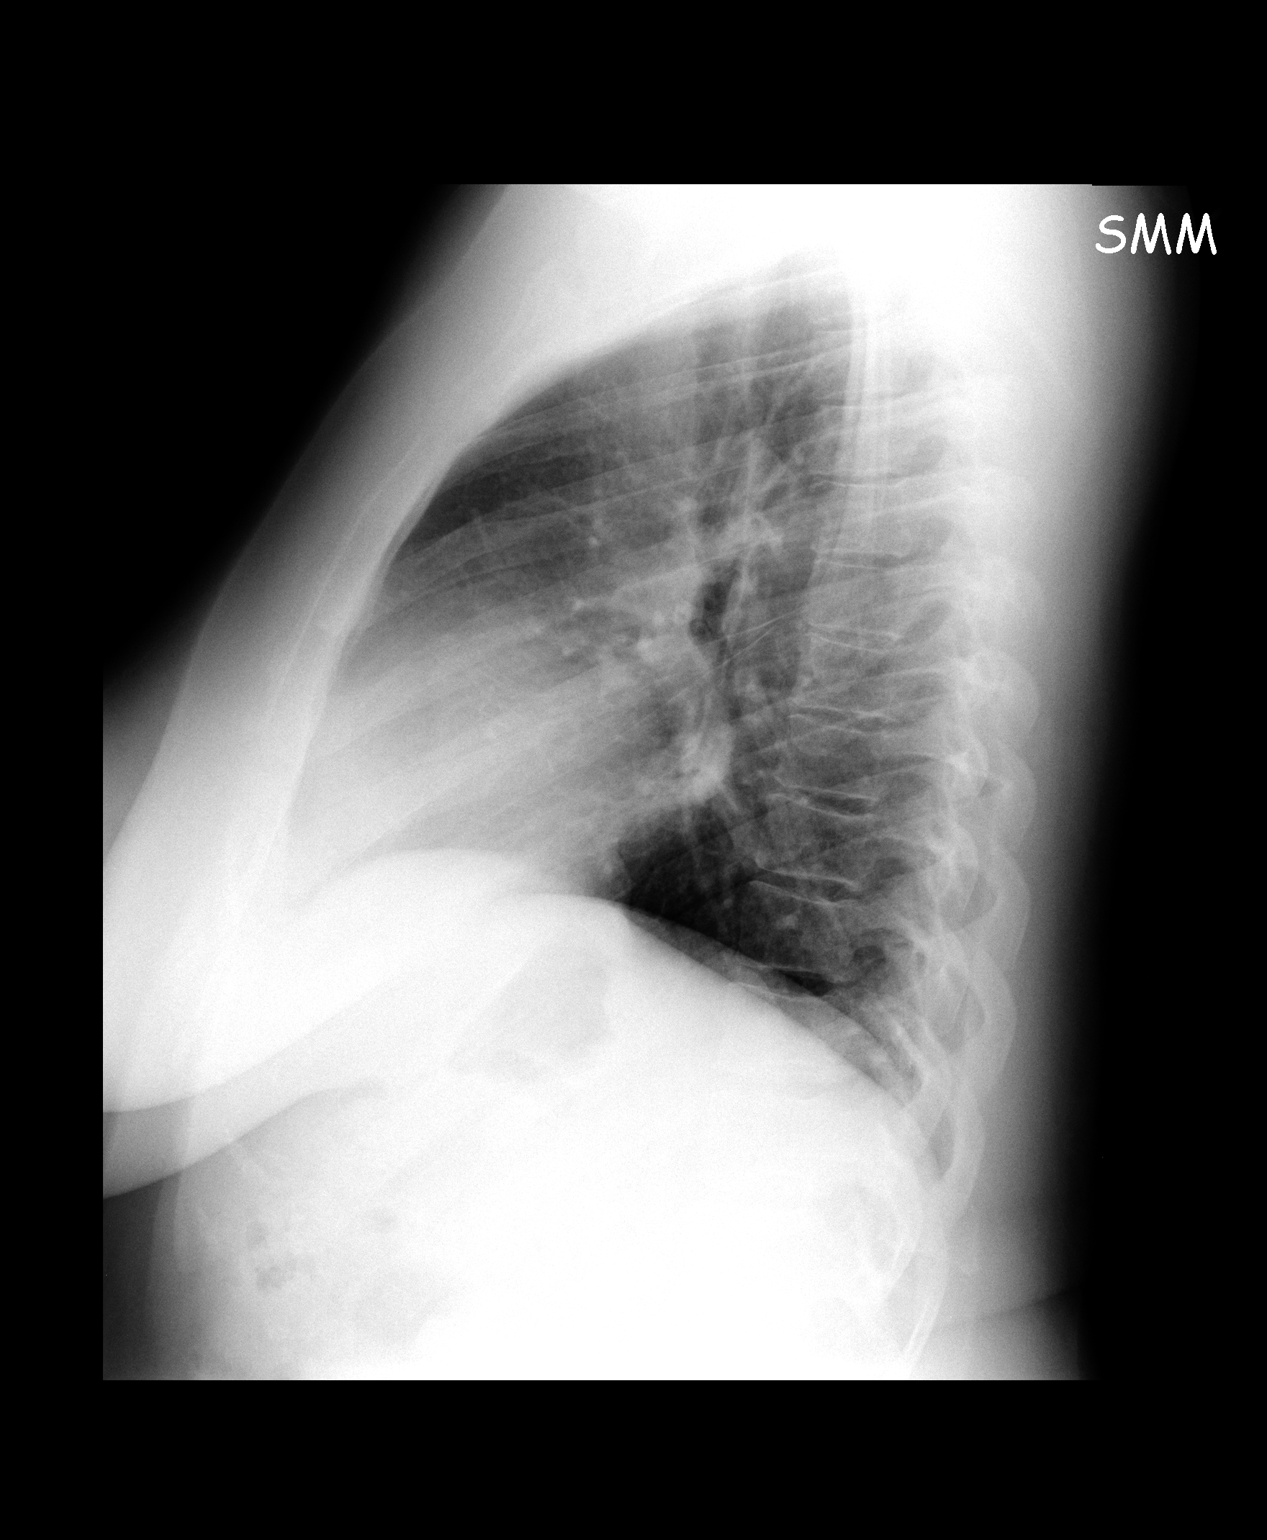

[2 of 2 positions shown; findings below may reference images not displayed]

FINDINGS: Slightly low lung volumes.  Cardiopericardial silhouette
and mediastinal contours are within normal limits.  No airspace
disease or effusion.  Trachea midline.  Paratracheal soft tissues
are normal.
IMPRESSION: No active cardiopulmonary disease.

## 2010-10-19 NOTE — Progress Notes (Signed)
Addended by: Virgel Paling on: 10/19/2010 12:52 PM   Modules accepted: Orders

## 2010-10-31 ENCOUNTER — Other Ambulatory Visit: Payer: Self-pay | Admitting: Hematology and Oncology

## 2010-10-31 ENCOUNTER — Encounter (HOSPITAL_COMMUNITY): Payer: Self-pay

## 2010-10-31 ENCOUNTER — Ambulatory Visit (HOSPITAL_COMMUNITY)
Admission: RE | Admit: 2010-10-31 | Discharge: 2010-10-31 | Disposition: A | Discharge status: Home / Self Care | Payer: BC Managed Care – PPO | Source: Ambulatory Visit | Attending: Hematology and Oncology | Admitting: Hematology and Oncology

## 2010-10-31 ENCOUNTER — Inpatient Hospital Stay (HOSPITAL_COMMUNITY): Admission: RE | Admit: 2010-10-31 | Payer: Self-pay | Source: Ambulatory Visit

## 2010-10-31 ENCOUNTER — Encounter (HOSPITAL_BASED_OUTPATIENT_CLINIC_OR_DEPARTMENT_OTHER): Payer: BC Managed Care – PPO | Admitting: Hematology and Oncology

## 2010-10-31 DIAGNOSIS — Z87891 Personal history of nicotine dependence: Secondary | ICD-10-CM | POA: Insufficient documentation

## 2010-10-31 DIAGNOSIS — Z9221 Personal history of antineoplastic chemotherapy: Secondary | ICD-10-CM | POA: Insufficient documentation

## 2010-10-31 DIAGNOSIS — C20 Malignant neoplasm of rectum: Secondary | ICD-10-CM

## 2010-10-31 DIAGNOSIS — Z923 Personal history of irradiation: Secondary | ICD-10-CM | POA: Insufficient documentation

## 2010-10-31 LAB — CBC WITH DIFFERENTIAL/PLATELET
BASO%: 0.5 % (ref 0.0–2.0)
Eosinophils Absolute: 0.1 10*3/uL (ref 0.0–0.5)
MCHC: 32.5 g/dL (ref 31.5–36.0)
MONO#: 0.3 10*3/uL (ref 0.1–0.9)
NEUT#: 1.3 10*3/uL — ABNORMAL LOW (ref 1.5–6.5)
RBC: 5.18 10*6/uL (ref 3.70–5.45)
RDW: 15.1 % — ABNORMAL HIGH (ref 11.2–14.5)
WBC: 3.2 10*3/uL — ABNORMAL LOW (ref 3.9–10.3)

## 2010-10-31 LAB — CMP (CANCER CENTER ONLY)
ALT(SGPT): 11 U/L (ref 10–47)
Albumin: 3.3 g/dL (ref 3.3–5.5)
Alkaline Phosphatase: 71 U/L (ref 26–84)
CO2: 26 mEq/L (ref 18–33)
Glucose, Bld: 113 mg/dL (ref 73–118)
Potassium: 4.4 mEq/L (ref 3.3–4.7)
Sodium: 137 mEq/L (ref 128–145)
Total Protein: 6.6 g/dL (ref 6.4–8.1)

## 2010-10-31 LAB — CEA: CEA: 1.3 ng/mL (ref 0.0–5.0)

## 2010-10-31 IMAGING — CR DG CHEST 2V
2 series · 2 of 2 positions shown · non-contrast
Comparison: [DATE].

CLINICAL DATA: Rectum cancer.  Ex-smoker.

CHEST - 2 VIEW

[w chest pa]
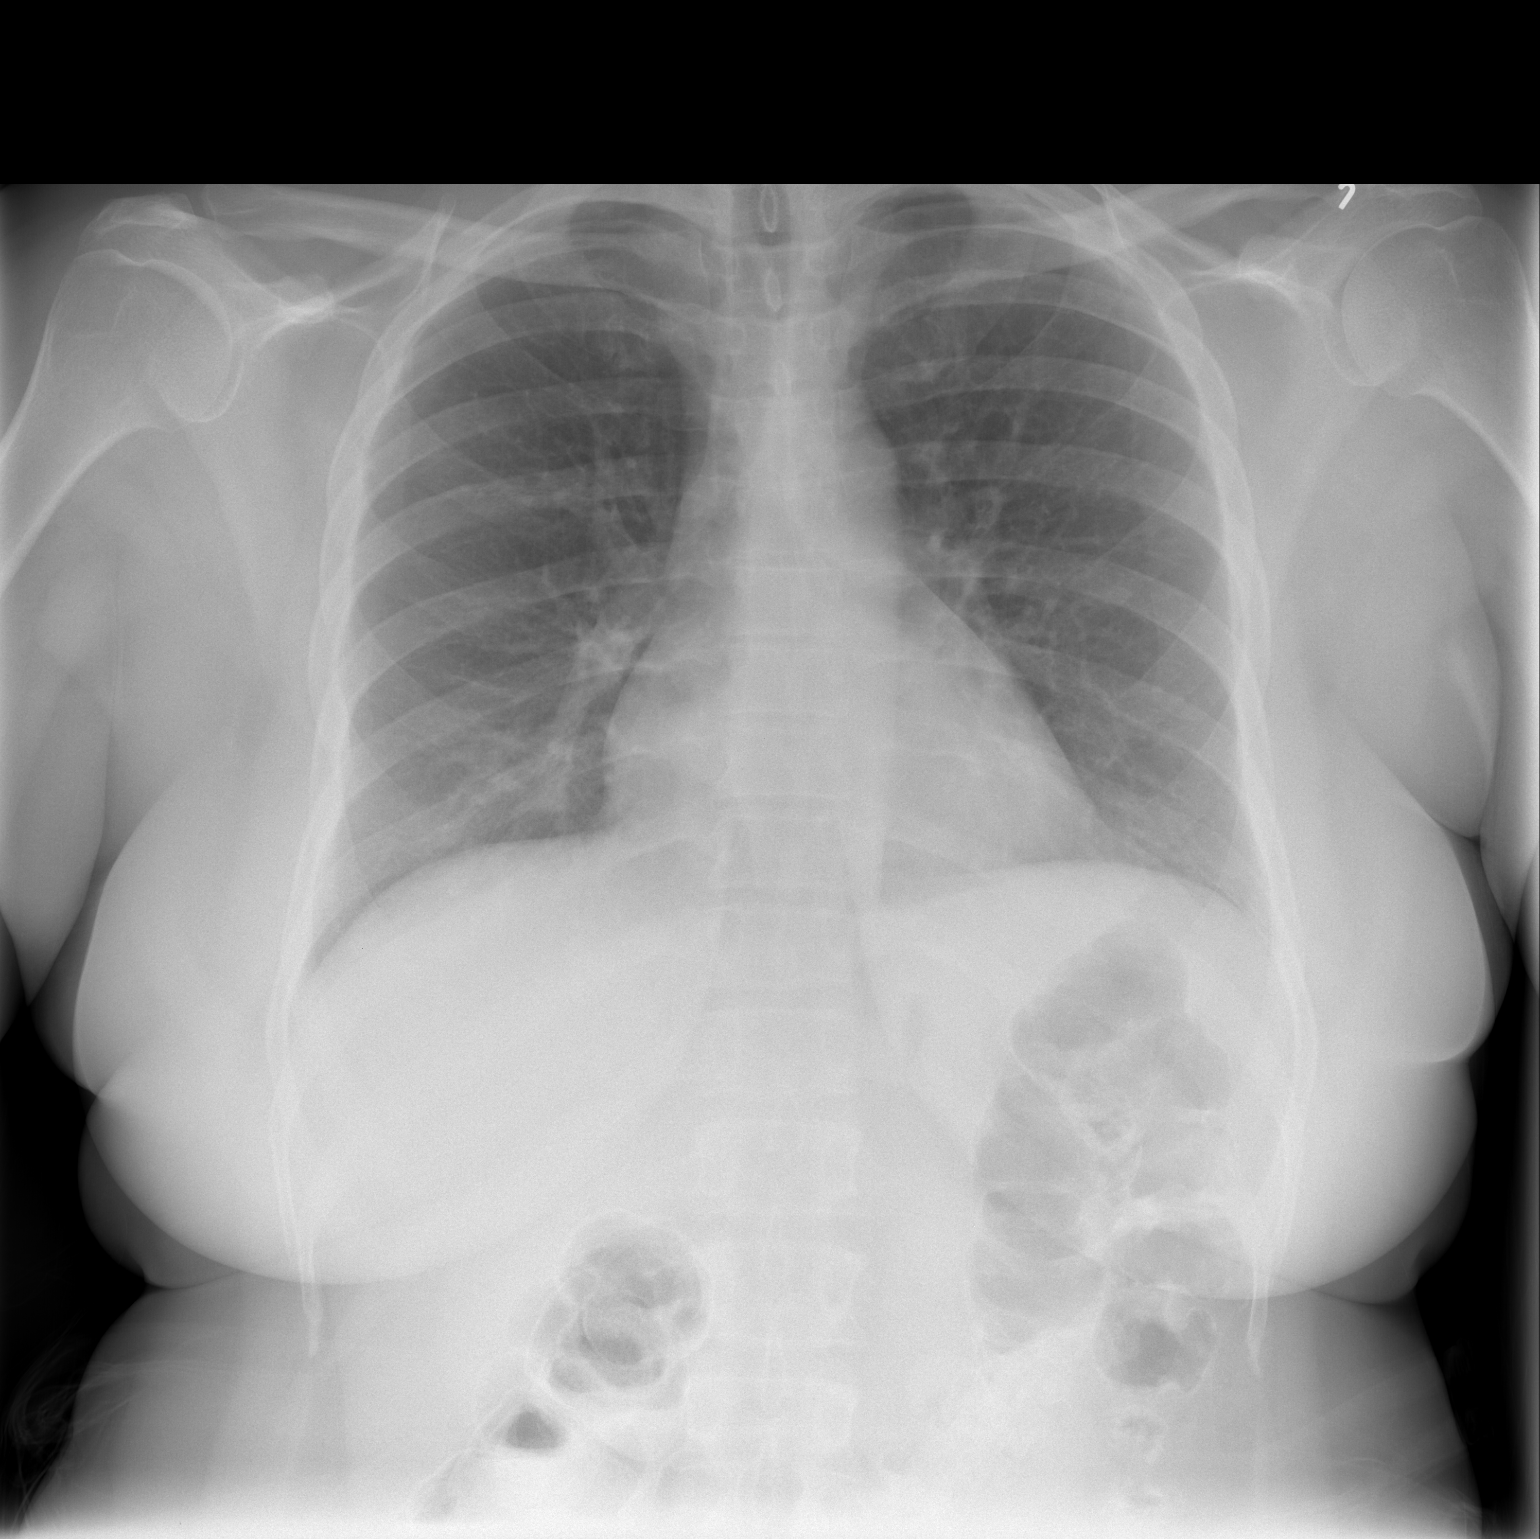

[w chest lat]
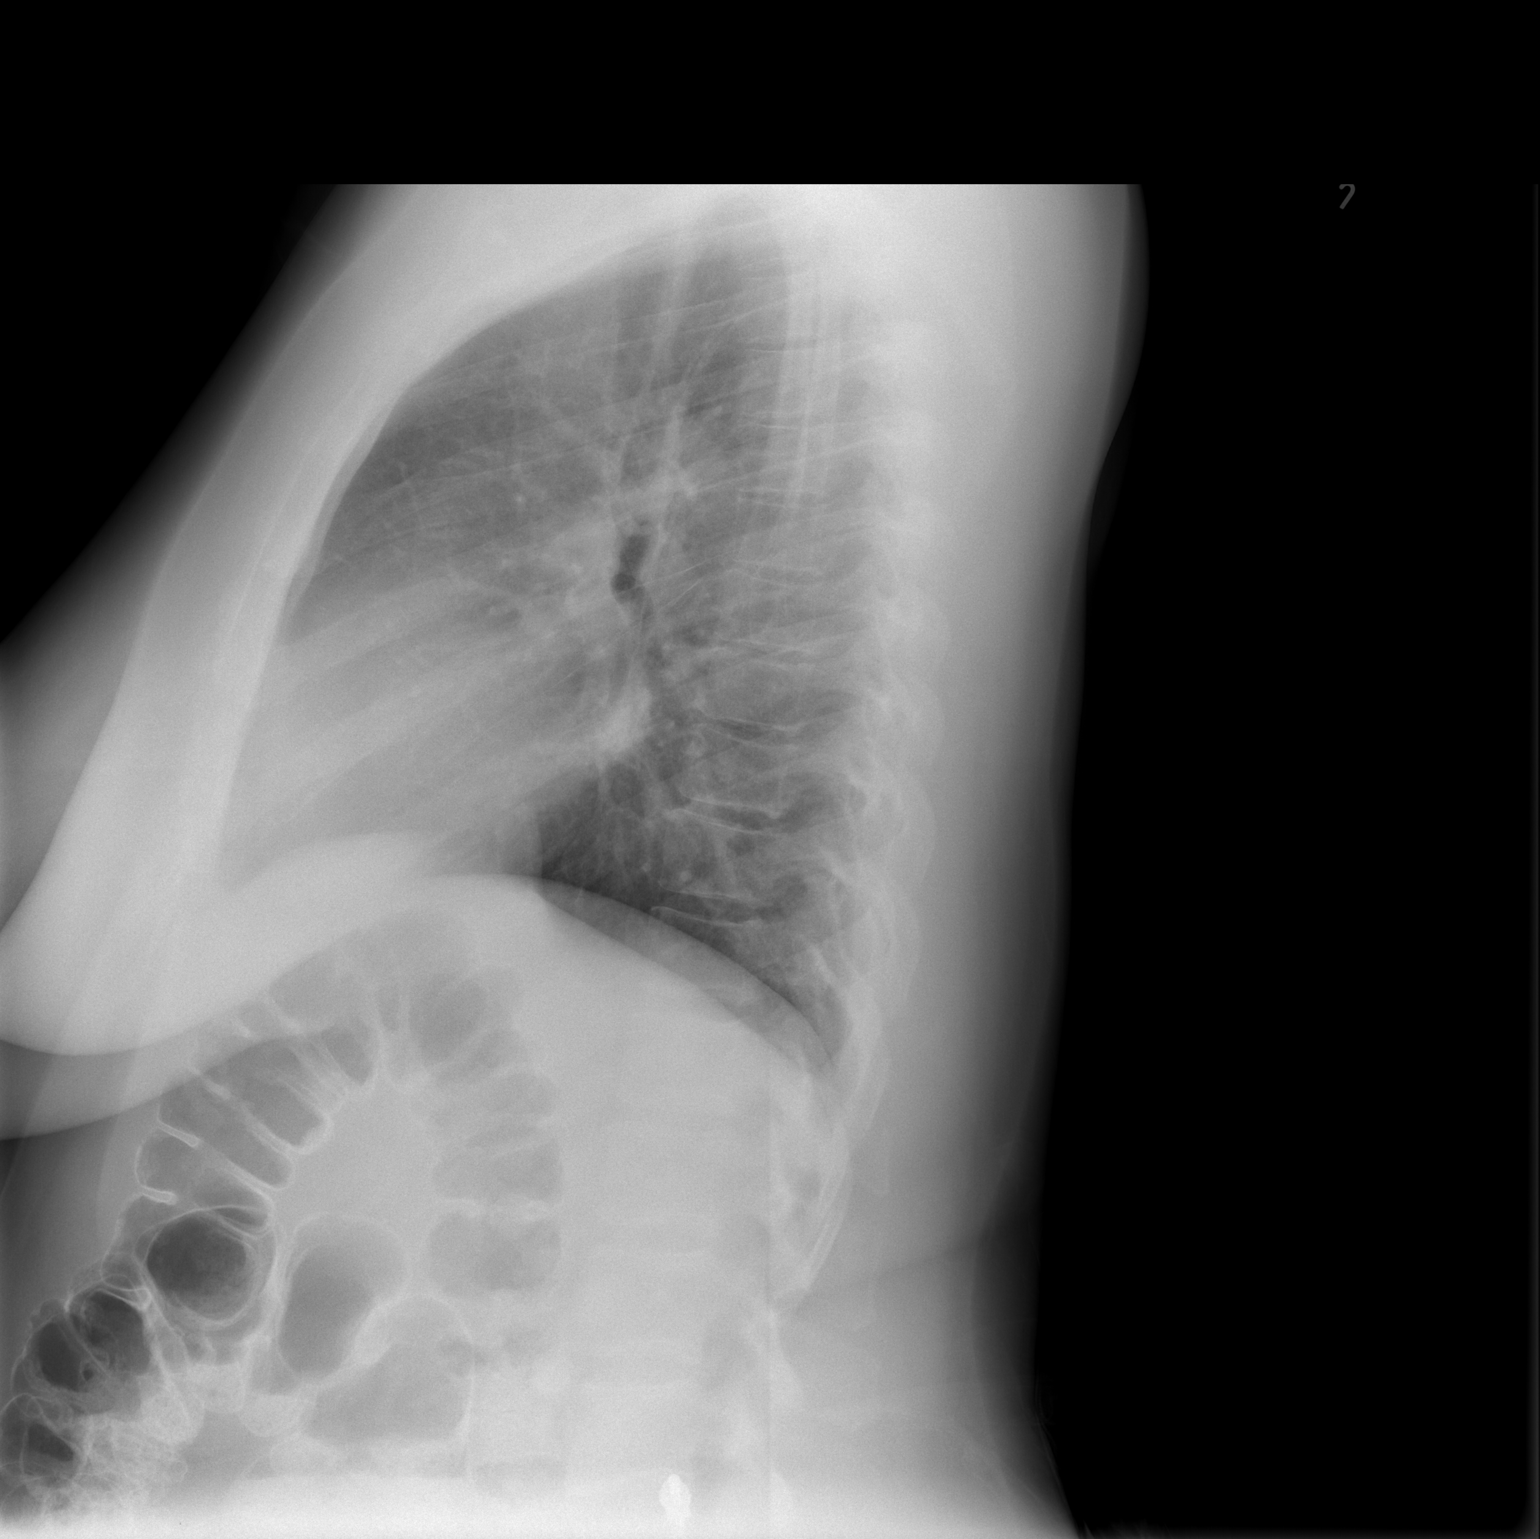

[2 of 2 positions shown; findings below may reference images not displayed]

FINDINGS: The cardiac silhouette is normal size and shape.
Mediastinal and hilar contours are stable. The lungs are well
aerated and free of infiltrates. No pleural abnormality is evident.
Bones appear average for age.
IMPRESSION: No cardiopulmonary disease demonstrated.  No evidence of metastatic
disease, adenopathy, or pleural effusion.

## 2010-10-31 IMAGING — CT CT ABD-PELV W/ CM
2 of 6 series · 17 of 46 positions shown, 19 images · IV contrast (omnipaque)
Comparison: CT [DATE]

CLINICAL DATA: Follow-up rectal carcinoma, chemotherapy and
radiation therapy complete.

CT ABDOMEN AND PELVIS WITH CONTRAST
TECHNIQUE: Multidetector CT imaging of the abdomen and pelvis was
performed following the standard protocol during bolus
administration of intravenous contrast.
Contrast: 100 ml Omnipaque 300

[Series 2: rtn a/p with · axial · 0.74mm/px · z∈[-624,-250]mm · 14 of 85 slices shown, 16 images]
[im 5/85  soft-tissue]
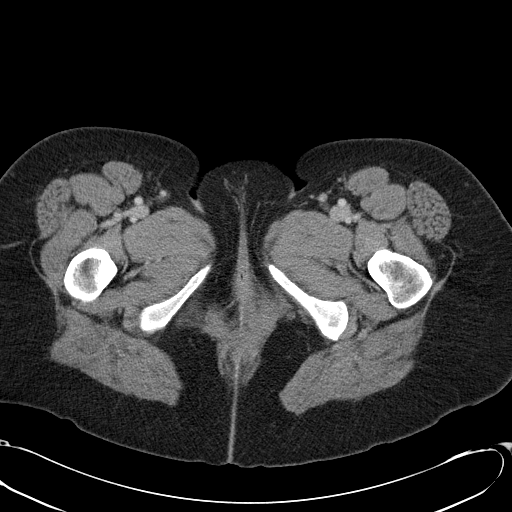
[im 5/85  bone]
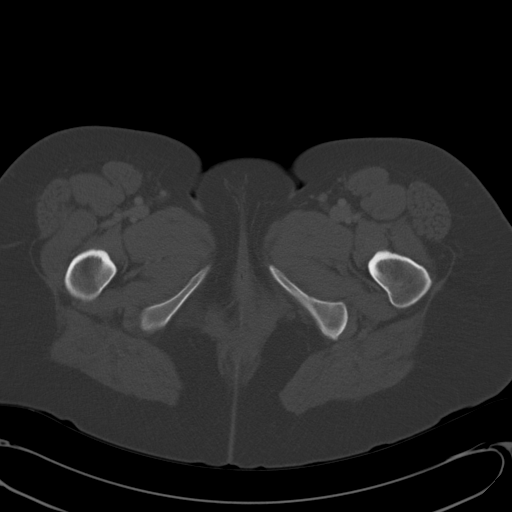
[im 10/85  soft-tissue]
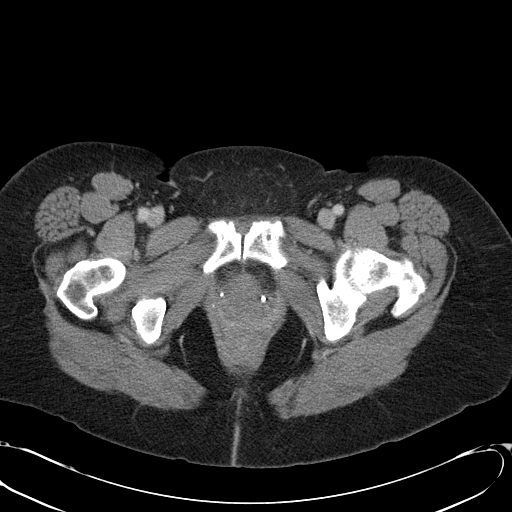
[im 15/85  soft-tissue]
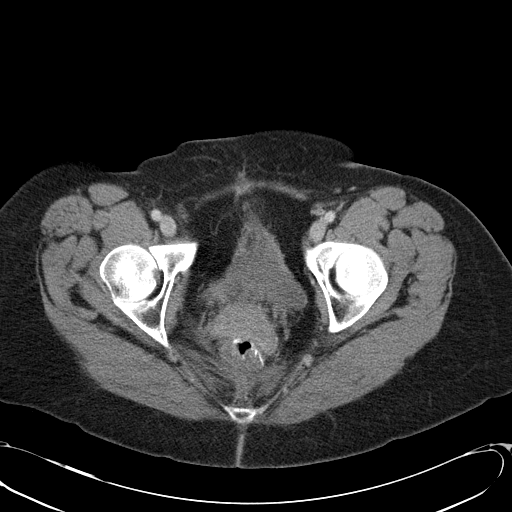
[im 25/85  soft-tissue]
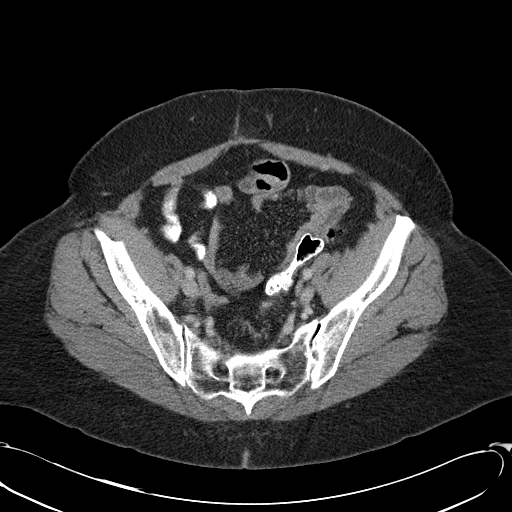
[im 30/85  soft-tissue]
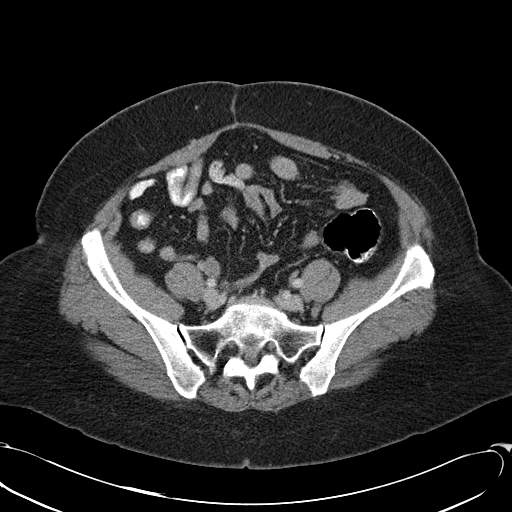
[im 35/85  soft-tissue]
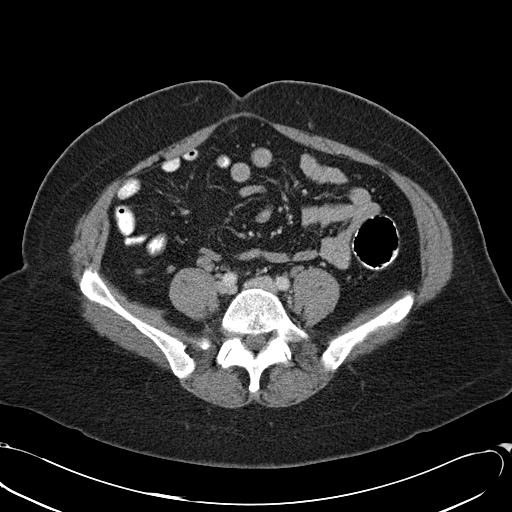
[im 40/85  soft-tissue]
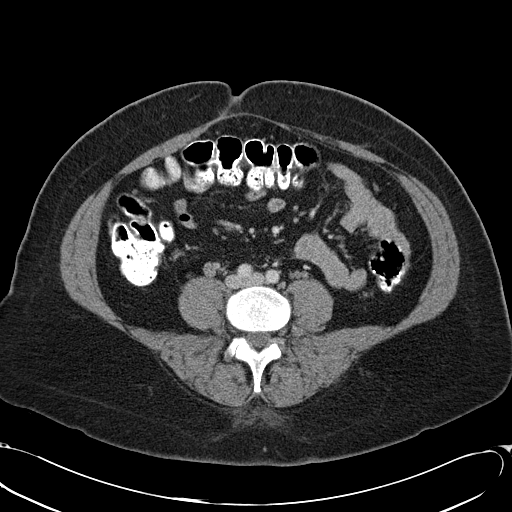
[im 45/85  soft-tissue]
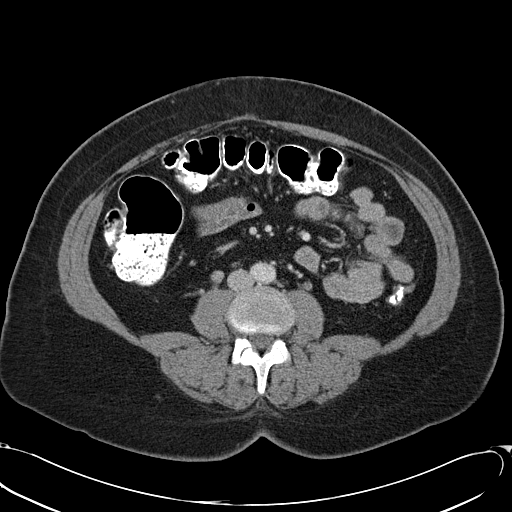
[im 50/85  soft-tissue]
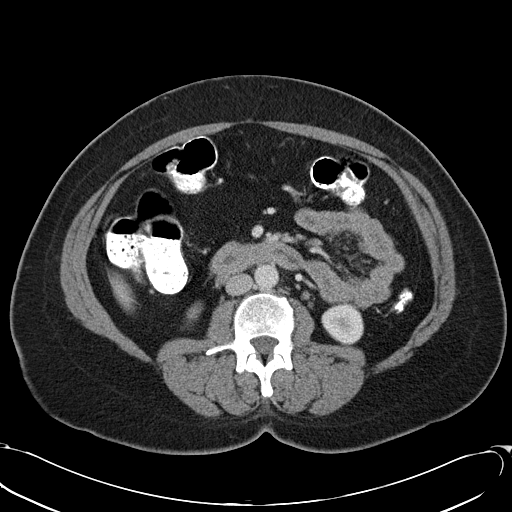
[im 50/85  bone]
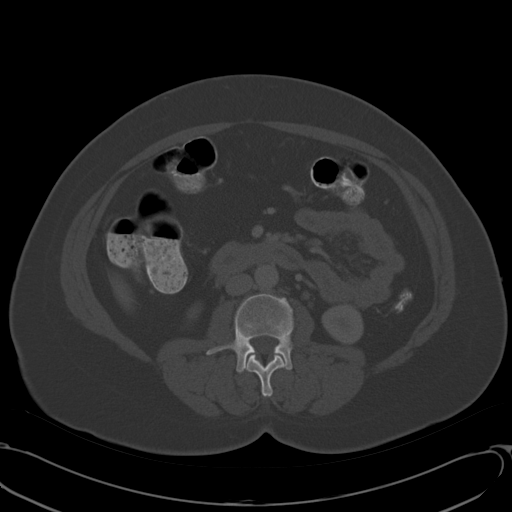
[im 55/85  soft-tissue]
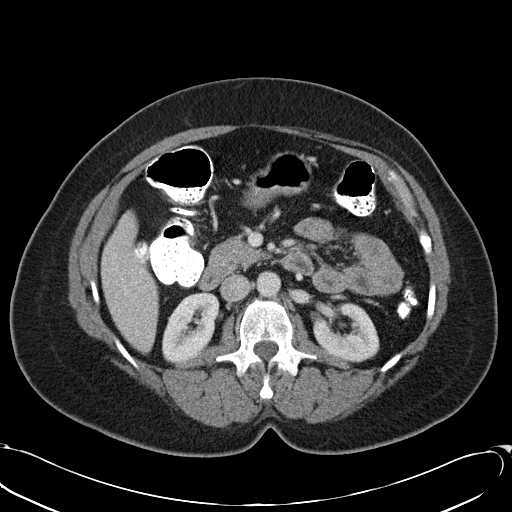
[im 65/85  soft-tissue]
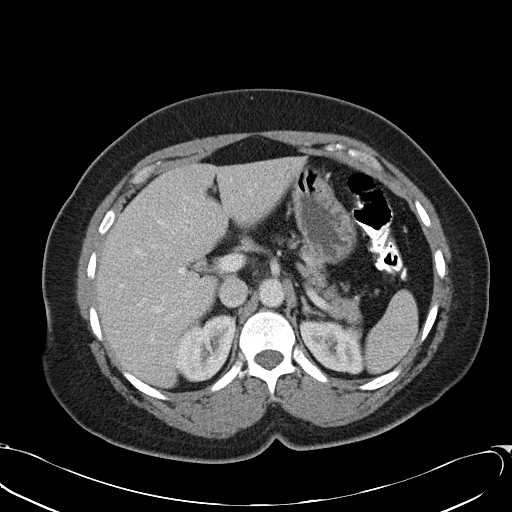
[im 70/85  soft-tissue]
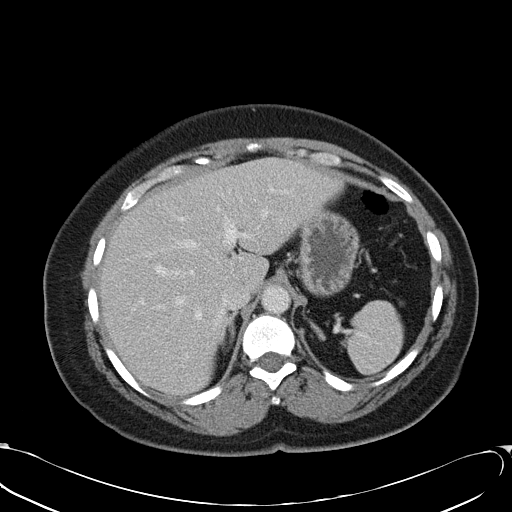
[im 75/85  soft-tissue]
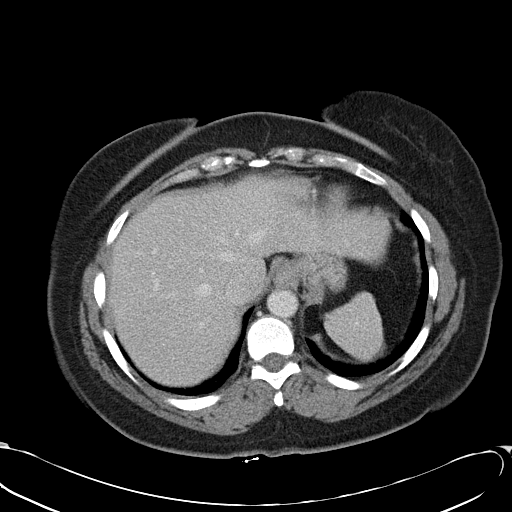
[im 80/85  soft-tissue]
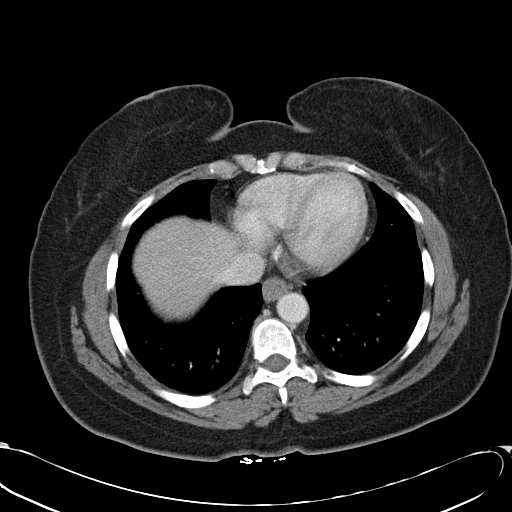

[Series 602: <mpr thick range> · coronal · 0.83mm/px · 3 of 97 slices shown]
[im 33/97  soft-tissue]
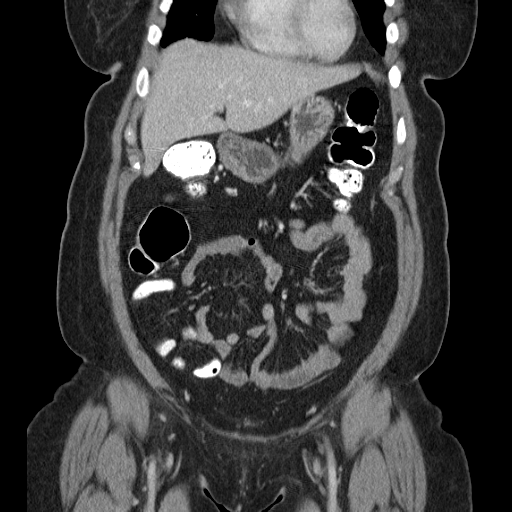
[im 43/97  soft-tissue]
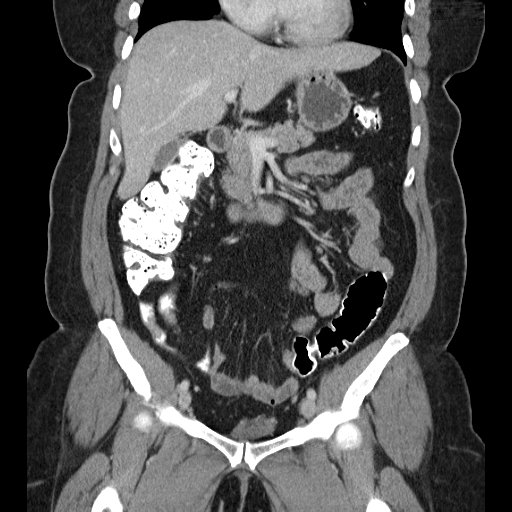
[im 54/97  soft-tissue]
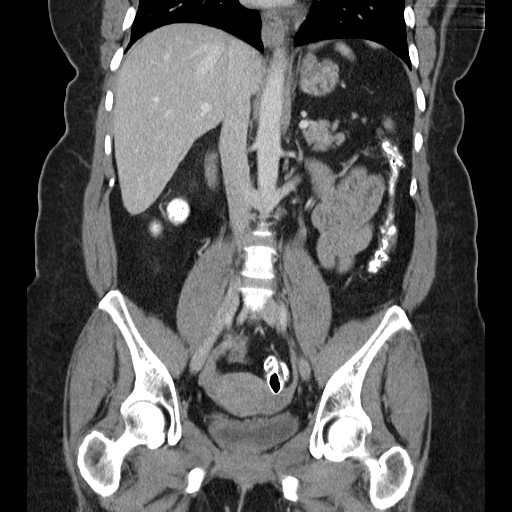

[17 of 46 positions shown; findings below may reference images not displayed]

FINDINGS: Lung bases are clear.  No pericardial fluid.

No focal hepatic lesion.  The gallbladder, pancreas, spleen,
adrenal glands, and kidneys are normal.

The stomach, small bowel, and cecum are normal.  The appendix is
normal.  Colon is normal.  There is a low rectal anastomoses
without evidence obstruction or nodularity.

No retroperitoneal lymphadenopathy.  Left retro aortic renal vein.
No pelvic lymphadenopathy.

No pelvic lymphadenopathy.  No free fluid the pelvis.  The bladder
and uterus are normal. Review of  bone windows demonstrates no
aggressive osseous lesions.
IMPRESSION: No evidence of local colon cancer recurrence or metastasis.

Stable low rectal anastomosis.

## 2010-10-31 MED ORDER — IOHEXOL 300 MG/ML  SOLN
100.0000 mL | Freq: Once | INTRAMUSCULAR | Status: AC | PRN
  Administered 2010-10-31: 100 mL via INTRAVENOUS

## 2010-11-01 ENCOUNTER — Encounter (HOSPITAL_BASED_OUTPATIENT_CLINIC_OR_DEPARTMENT_OTHER): Payer: BC Managed Care – PPO | Admitting: Hematology and Oncology

## 2010-11-01 DIAGNOSIS — C20 Malignant neoplasm of rectum: Secondary | ICD-10-CM

## 2011-04-09 ENCOUNTER — Telehealth: Payer: Self-pay | Admitting: Hematology and Oncology

## 2011-04-09 ENCOUNTER — Other Ambulatory Visit: Payer: Self-pay | Admitting: Hematology and Oncology

## 2011-04-09 DIAGNOSIS — C189 Malignant neoplasm of colon, unspecified: Secondary | ICD-10-CM

## 2011-04-09 NOTE — Telephone Encounter (Signed)
S/w the pt and she is aware of her aug 2013 appts. Per pt she will pick up her contrast later on

## 2011-04-30 ENCOUNTER — Telehealth: Payer: Self-pay | Admitting: Family Medicine

## 2011-04-30 NOTE — Telephone Encounter (Signed)
Pt called and left message on my voice mail that she had a uti from all the meds she has taken from the flu.  We have seen her in over a year.  I advised pt that we cannot diagnose over the phone.  She said she could not afford to come in.  Advise drink plenty of liquids and cranberry to flush out and let us know if she would like to be seen.

## 2011-05-04 ENCOUNTER — Ambulatory Visit (INDEPENDENT_AMBULATORY_CARE_PROVIDER_SITE_OTHER): Payer: BC Managed Care – PPO | Admitting: Medical

## 2011-05-04 ENCOUNTER — Encounter: Payer: Self-pay | Admitting: Medical

## 2011-05-04 VITALS — BP 120/70 | HR 78 | Temp 98.2°F | Wt 188.0 lb

## 2011-05-04 DIAGNOSIS — R300 Dysuria: Secondary | ICD-10-CM

## 2011-05-04 MED ORDER — SULFAMETHOXAZOLE-TRIMETHOPRIM 800-160 MG PO TABS: 1.0000 | ORAL_TABLET | Freq: Two times a day (BID) | ORAL | Status: AC

## 2011-05-04 NOTE — Patient Instructions (Signed)

## 2011-05-04 NOTE — Progress Notes (Signed)
Subjective:  [name] [name] is a 51 y.o. female who complains of frequency, nocturia, urgency and lower abdominal pressure. She has had symptoms for 1.5 week.  Patient denies back pain, fever and stomach ache. Patient does have a history of recurrent UTI. Patient does not have a history of pyelonephritis.  Using Azo, and started using Monistat as she wasn't sure what was going on.  Past Medical History  Diagnosis Date  . Cancer     rectal ca  . Colon cancer     Objective:    Filed Vitals:   05/04/11 0813  BP: 120/70  Pulse: 78  Temp: 98.2 F (36.8 C)    General appearance: alert, no distress, WD/WN, female Abdomen: +bs, soft, mild suprapubic tenderness, non distended, no masses, no hepatomegaly, no splenomegaly, no bruits Back: no CVA tenderness Pulses: 2+ symmetric     Assessment and Plan:   Encounter Diagnosis  Name Primary?  . Dysuria Yes   Unable to urinate today, urinated just before she came in today.  We will treat empirically with Bactrim.  Hydrate well, call or return if worse or not improving.  If unable to pass urine by this afternoon, go to the ED.

## 2011-07-31 ENCOUNTER — Other Ambulatory Visit: Payer: Self-pay | Admitting: *Deleted

## 2011-07-31 ENCOUNTER — Telehealth: Payer: Self-pay | Admitting: Hematology and Oncology

## 2011-07-31 NOTE — Telephone Encounter (Signed)
Per 5/28 pof moved 8/28 appt from SW to LO @ 11:30 am. lmonvm for pt re change w/new d/t for 8/28 and confirmed lb/ct 8/23.

## 2011-09-18 ENCOUNTER — Other Ambulatory Visit: Payer: Self-pay | Admitting: Hematology and Oncology

## 2011-09-18 DIAGNOSIS — Z1231 Encounter for screening mammogram for malignant neoplasm of breast: Secondary | ICD-10-CM

## 2011-10-15 ENCOUNTER — Ambulatory Visit: Payer: BC Managed Care – PPO

## 2011-10-24 ENCOUNTER — Telehealth: Payer: Self-pay | Admitting: Hematology and Oncology

## 2011-10-24 NOTE — Telephone Encounter (Signed)
Gave pt oral contrast, NPO 4 hours prior to CT

## 2011-10-26 ENCOUNTER — Other Ambulatory Visit: Payer: Self-pay | Admitting: Hematology and Oncology

## 2011-10-26 ENCOUNTER — Encounter (HOSPITAL_COMMUNITY): Payer: Self-pay

## 2011-10-26 ENCOUNTER — Ambulatory Visit (HOSPITAL_COMMUNITY)
Admission: RE | Admit: 2011-10-26 | Discharge: 2011-10-26 | Disposition: A | Discharge status: Home / Self Care | Payer: BC Managed Care – PPO | Source: Ambulatory Visit | Attending: Hematology and Oncology | Admitting: Hematology and Oncology

## 2011-10-26 ENCOUNTER — Other Ambulatory Visit (HOSPITAL_BASED_OUTPATIENT_CLINIC_OR_DEPARTMENT_OTHER): Payer: BC Managed Care – PPO | Admitting: Lab

## 2011-10-26 DIAGNOSIS — C20 Malignant neoplasm of rectum: Secondary | ICD-10-CM

## 2011-10-26 DIAGNOSIS — C189 Malignant neoplasm of colon, unspecified: Secondary | ICD-10-CM

## 2011-10-26 LAB — CBC WITH DIFFERENTIAL/PLATELET
BASO%: 0.5 % (ref 0.0–2.0)
EOS%: 1.1 % (ref 0.0–7.0)
HCT: 37 % (ref 34.8–46.6)
MCH: 22.5 pg — ABNORMAL LOW (ref 25.1–34.0)
MCHC: 32.1 g/dL (ref 31.5–36.0)
MONO#: 0.4 10*3/uL (ref 0.1–0.9)
NEUT%: 37.1 % — ABNORMAL LOW (ref 38.4–76.8)
RBC: 5.28 10*6/uL (ref 3.70–5.45)
RDW: 15.2 % — ABNORMAL HIGH (ref 11.2–14.5)
WBC: 4.4 10*3/uL (ref 3.9–10.3)
lymph#: 2.3 10*3/uL (ref 0.9–3.3)

## 2011-10-26 LAB — CMP (CANCER CENTER ONLY)
ALT(SGPT): 21 U/L (ref 10–47)
Albumin: 3.8 g/dL (ref 3.3–5.5)
CO2: 27 mEq/L (ref 18–33)
Calcium: 8.5 mg/dL (ref 8.0–10.3)
Chloride: 103 mEq/L (ref 98–108)
Creat: 0.8 mg/dl (ref 0.6–1.2)
Potassium: 4.3 mEq/L (ref 3.3–4.7)

## 2011-10-26 LAB — CEA: CEA: 1.5 ng/mL (ref 0.0–5.0)

## 2011-10-26 IMAGING — CT CT ABD-PELV W/ CM
2 of 6 series · 16 of 46 positions shown, 18 images · IV contrast (OMNIPAQUE)
Comparison: CT abdomen/pelvis [DATE].

CT CHEST

CLINICAL DATA: Rectal cancer.

CT CHEST, ABDOMEN AND PELVIS WITH CONTRAST
TECHNIQUE: Multidetector CT imaging of the chest, abdomen and
pelvis was performed following the standard protocol during bolus
administration of intravenous contrast.
Contrast: 100mL OMNIPAQUE IOHEXOL 300 MG/ML  SOLN

[Series 2: cap with st · axial · 0.78mm/px · z∈[-572,-58]mm · 13 of 117 slices shown, 15 images]
[im 7/117  soft-tissue]
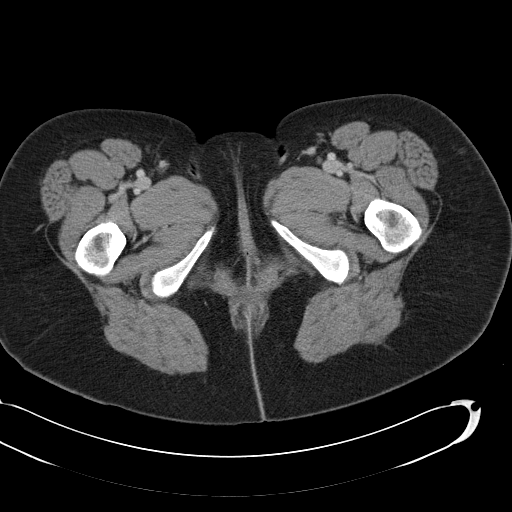
[im 7/117  bone]
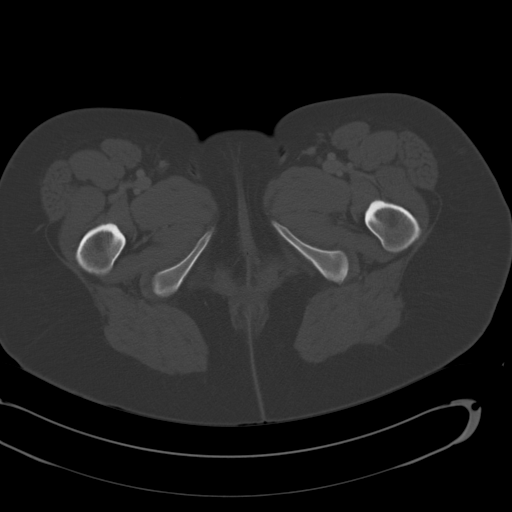
[im 14/117  soft-tissue]
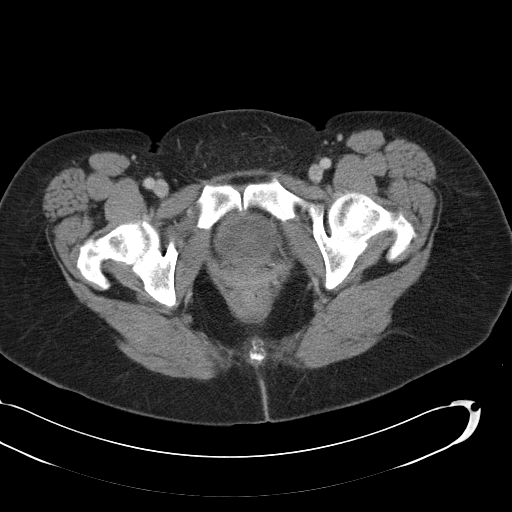
[im 28/117  soft-tissue]
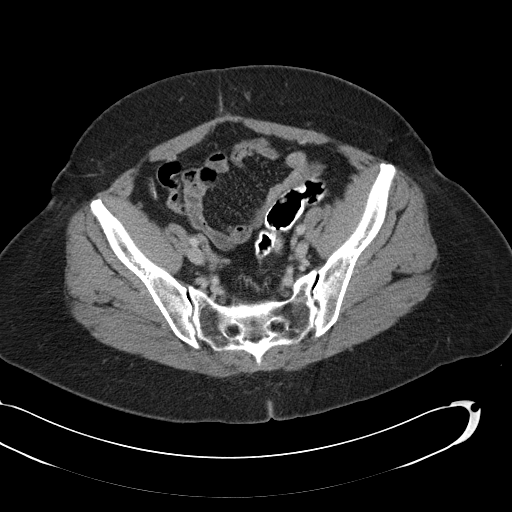
[im 35/117  soft-tissue]
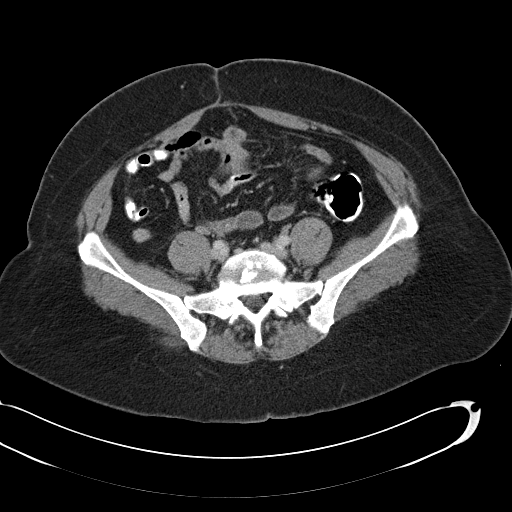
[im 41/117  soft-tissue]
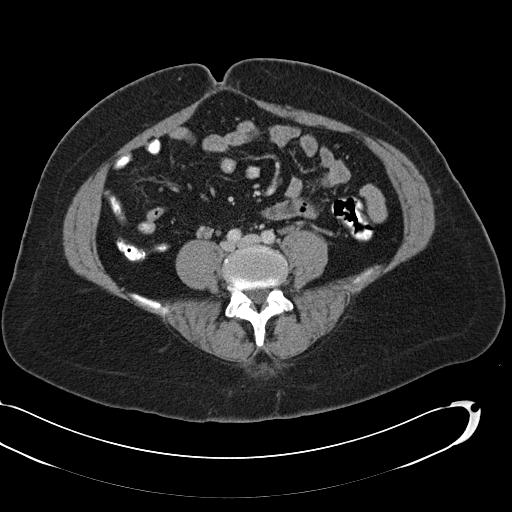
[im 48/117  soft-tissue]
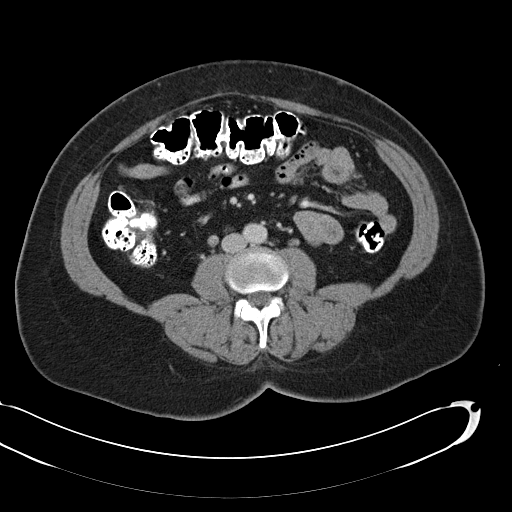
[im 62/117  soft-tissue]
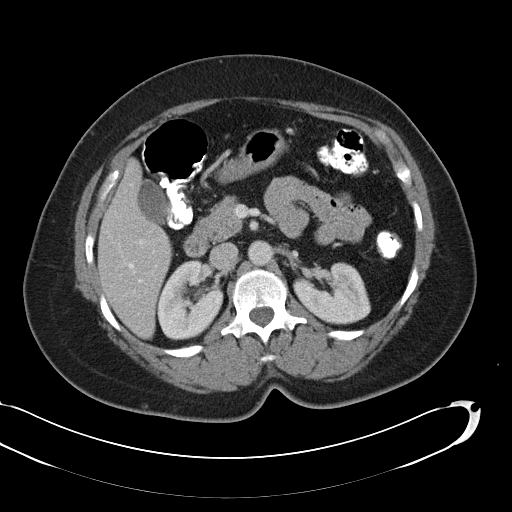
[im 69/117  soft-tissue]
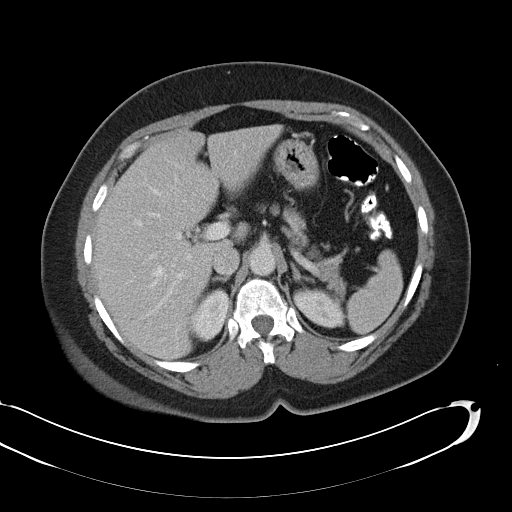
[im 76/117  soft-tissue]
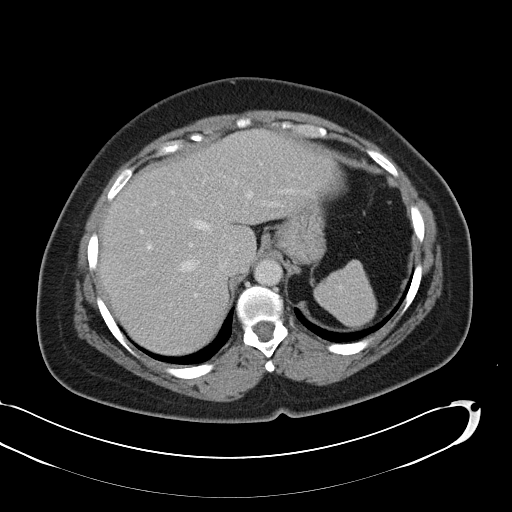
[im 76/117  bone]
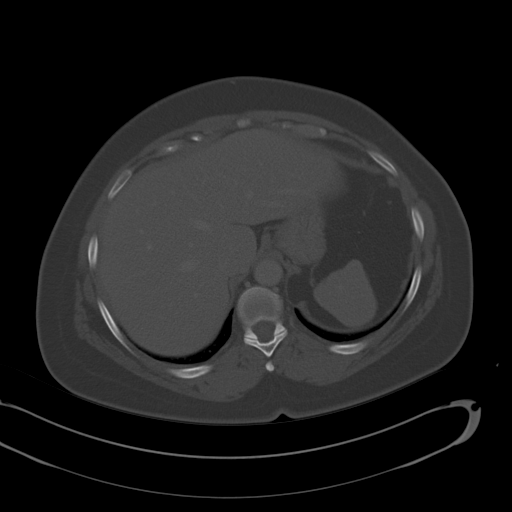
[im 82/117  soft-tissue]
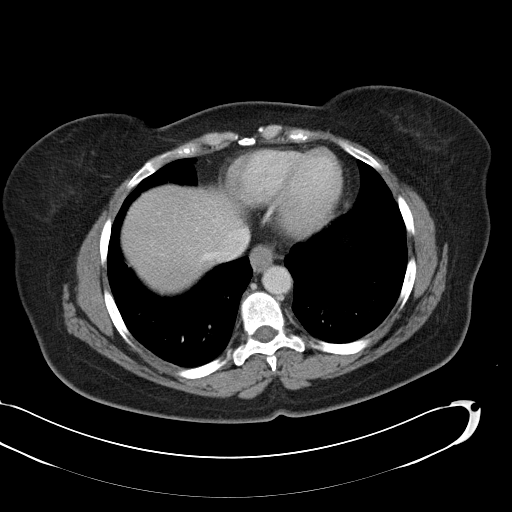
[im 89/117  soft-tissue]
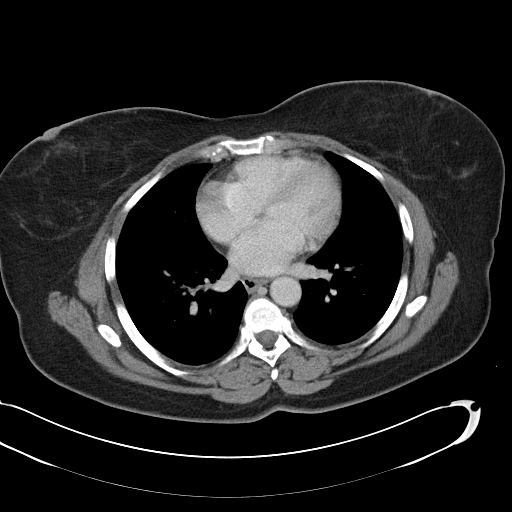
[im 103/117  soft-tissue]
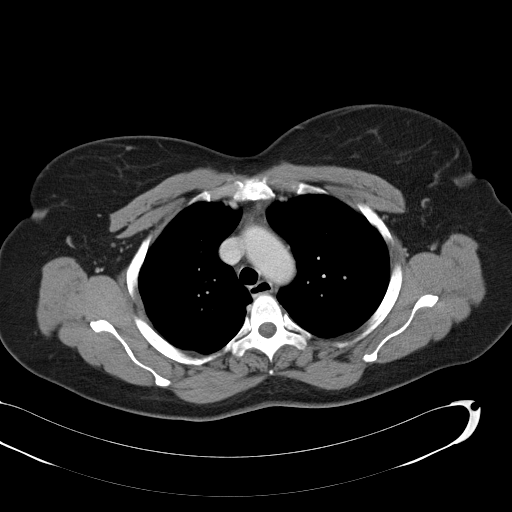
[im 110/117  soft-tissue]
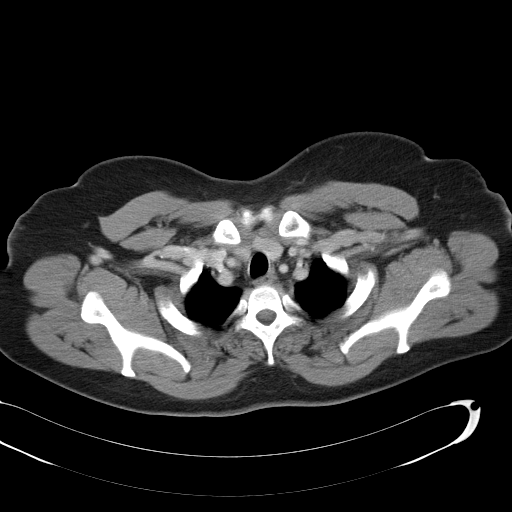

[Series 602: <mpr thick range> · coronal · 1.14mm/px · 3 of 94 slices shown]
[im 32/94  soft-tissue]
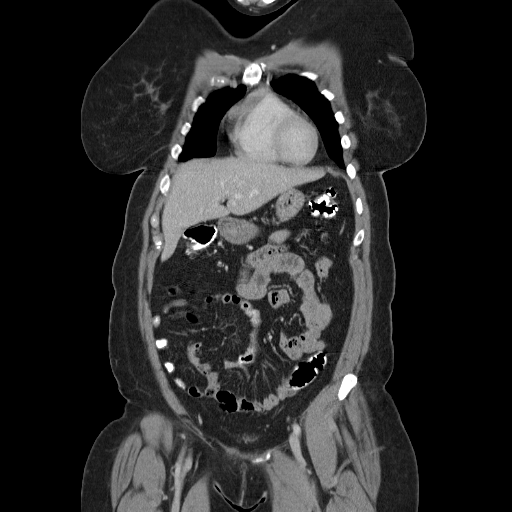
[im 42/94  soft-tissue]
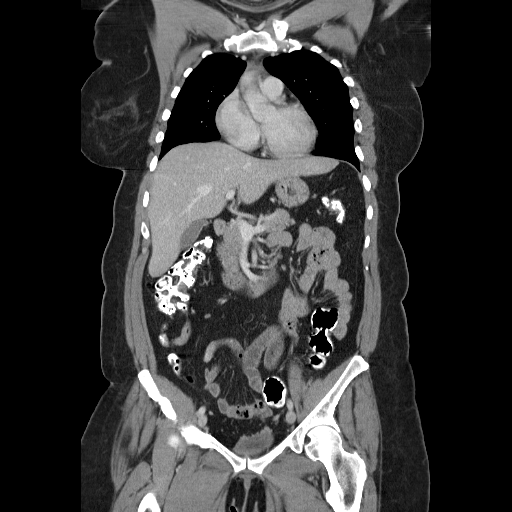
[im 52/94  soft-tissue]
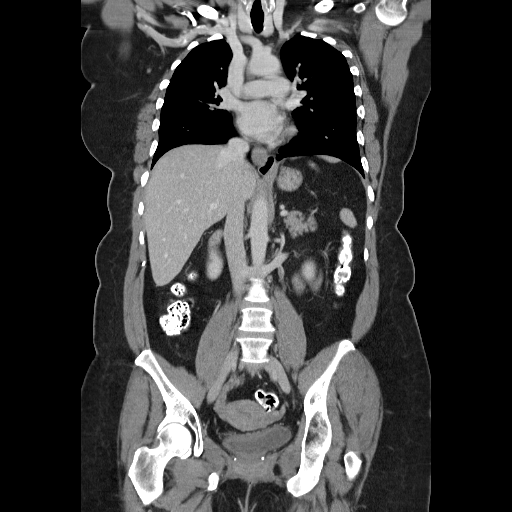

[16 of 46 positions shown; findings below may reference images not displayed]

FINDINGS: The chest wall is unremarkable.  No breast masses,
supraclavicular or axillary lymphadenopathy.  The thyroid gland
appears normal.  The bony thorax is intact.

The heart is normal in size.  No pericardial effusion.  No
mediastinal or hilar lymphadenopathy.  The aorta is normal in
caliber.  No dissection.  The branch vessels are normal. Stable
small hiatal hernia.

Examination of the lung parenchyma demonstrates no acute pulmonary
findings.  No pulmonary nodules or mass lesions.  The
tracheobronchial tree is unremarkable.
IMPRESSION: No CT findings for metastatic disease involving the chest.

CT ABDOMEN AND PELVIS
FINDINGS: The liver is unremarkable.  No focal lesions or
intrahepatic biliary dilatation.  The gallbladder appears normal.
No common bile duct dilatation.  Pancreas is normal.  The spleen is
normal in size.  No focal lesions.  The adrenal glands and kidneys
are normal and stable.

The stomach, duodenum, small bowel and colon are unremarkable.  The
appendix is normal.  Stable low rectal anastomoses.  No
complicating features are demonstrated.  Stable postoperative
changes in the presacral space with stable soft tissue density.  No
pelvic mass or lymphadenopathy.

The uterus and ovaries are normal and stable.  The bladder is
unremarkable.  The aorta is normal in caliber.  The major branch
vessels are normal.  A retroaortic left renal vein is noted.  There
are small scattered mesenteric and retroperitoneal lymph nodes
which are stable.
IMPRESSION: 1.  Stable surgical changes from a low rectal anastomoses.
Presacral soft tissue thickening is unchanged.
2.  No CT findings to suggest metastatic disease in the abdomen or
pelvis.

## 2011-10-26 MED ORDER — IOHEXOL 300 MG/ML  SOLN
100.0000 mL | Freq: Once | INTRAMUSCULAR | Status: AC | PRN
  Administered 2011-10-26: 100 mL via INTRAVENOUS

## 2011-10-31 ENCOUNTER — Ambulatory Visit (HOSPITAL_BASED_OUTPATIENT_CLINIC_OR_DEPARTMENT_OTHER): Payer: BC Managed Care – PPO | Admitting: Hematology and Oncology

## 2011-10-31 ENCOUNTER — Encounter: Payer: Self-pay | Admitting: Hematology and Oncology

## 2011-10-31 ENCOUNTER — Ambulatory Visit: Payer: BC Managed Care – PPO | Admitting: Physician Assistant

## 2011-10-31 VITALS — BP 134/80 | HR 83 | Temp 97.0°F | Resp 18 | Ht 60.0 in | Wt 192.3 lb

## 2011-10-31 DIAGNOSIS — C189 Malignant neoplasm of colon, unspecified: Secondary | ICD-10-CM

## 2011-10-31 DIAGNOSIS — C20 Malignant neoplasm of rectum: Secondary | ICD-10-CM

## 2011-10-31 NOTE — Progress Notes (Signed)
This office note has been dictated.

## 2011-10-31 NOTE — Progress Notes (Signed)
CC:   [name] [name], M.D. [name] Hawks [name], MD [name] [name]. [name] Dice, MD,FACG  IDENTIFYING STATEMENT:  The patient is a 51 year old woman with rectal cancer who presents for followup.  INTERVAL HISTORY:  [name] [name] was seen a year ago.  Has had no current concerns since her last followup visit.  She feels remarkably well.  She has not lost any weight.  She received a colonoscopy on 07/26/2010 that was unremarkable.  Reviewed results of recent CT of the chest, abdomen and pelvis on 10/26/2011.  There was no evidence of recurrent disease.  MEDICATIONS:  Reviewed and updated.  ALLERGIES:  Epinephrine, lidocaine.  Past medical history, family history, social history unchanged.  REVIEW OF SYSTEMS:  Ten-point review of systems negative.  PHYSICAL EXAM:  The patient is a well-appearing, well-nourished woman in no current distress.  Vitals:  Pulse 83, blood pressure 134/80, temperature 97, respirations 18, weight 192 pounds.  HEENT:  Head is atraumatic, normocephalic.  Sclerae anicteric.  Mouth moist.  Neck: Supple.  Chest:  Clear.  Abdomen:  Soft, nontender.  Bowel sounds present.  Extremities:  No edema.  LABORATORY DATA:  10/26/2011 white cell count 4.4, hemoglobin 11.9, hematocrit 37, platelets 106, sodium 139, potassium 4.3, chloride 103, CO2 27, BUN 10, creatinine 0.8, glucose 119, total bilirubin 0.6, alkaline phosphatase 78, AST 20, ALT 21, calcium 8.5.  CEA 1.3.  Results of CTs as in interval history.  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  She received continuous infusion 5-FU with radiation from 11/26/2005 through 01/08/2006 requiring treatment breaks.  Following radiation, she received 1 cycle of XELOX but declined further therapy.  She took Xeloda for the first 3 cycles and then  again declined further treatment.  She is 6 years out from a diagnosis.  Her current lab exam indicates no evidence of recurrence.  She is up-to-date with colonoscopy, last receiving one May 2012.  She follows up in a year's time.    ______________________________ Laurice Record, M.D. LIO/MEDQ  D:  10/31/2011  T:  10/31/2011  Job:  [id number]

## 2011-10-31 NOTE — Patient Instructions (Signed)
[name] [name]  [id number]   [facility] - AFTER VISIT SUMMARY   **RECOMMENDATIONS MADE BY THE CONSULTANT AND ANY TEST    RESULTS WILL BE SENT TO YOUR REFERRING DOCTORS.   YOUR EXAM FINDINGS, LABS AND RESULTS WERE DISCUSSED BY YOUR MD TODAY.    Your vital signs are: Filed Vitals:   10/31/11 1130  BP: 134/80  Pulse: 83  Temp: 97 F (36.1 C)  Resp: 18    Your Updated drug allergies are: Allergies as of 10/31/2011 - Review Complete 10/31/2011  Allergen Reaction Noted  . Epinephrine Anaphylaxis 07/12/2010  . Lidocaine  07/12/2010    Your current list of medications are: Current Outpatient Prescriptions  Medication Sig Dispense Refill  . ibuprofen (ADVIL,MOTRIN) 200 MG tablet Take 200 mg by mouth every 6 (six) hours as needed.        . Pseudoephedrine HCl (SINUS & ALLERGY 12 HOUR PO) Take 1 tablet by mouth every 4 (four) hours as needed.           INSTRUCTIONS GIVEN AND DISCUSSED:  See attached schedule   SPECIAL INSTRUCTIONS/FOLLOW-UP:  See above.  I acknowledge that I have been informed and understand all the instructions given to me and received a copy. I do not have any more questions at this time, but understand that I may call the [facility] at [phone] during business hours should I have any further questions or need assistance in obtaining follow-up care.

## 2011-11-03 ENCOUNTER — Telehealth: Payer: Self-pay | Admitting: *Deleted

## 2011-11-03 NOTE — Telephone Encounter (Signed)
Gave patient appointment for 10-29-2012 at 11:30am made patient appointment for scans on 10-27-2012

## 2012-04-26 ENCOUNTER — Telehealth: Payer: Self-pay | Admitting: Oncology

## 2012-04-26 ENCOUNTER — Encounter: Payer: Self-pay | Admitting: Oncology

## 2012-04-26 NOTE — Telephone Encounter (Signed)
lmonvm adviising the pt of her reassigned md from dr lo to dr Truett Perna with a f/u letter and appt calendar

## 2012-04-30 ENCOUNTER — Other Ambulatory Visit: Payer: Self-pay

## 2012-04-30 DIAGNOSIS — Z1231 Encounter for screening mammogram for malignant neoplasm of breast: Secondary | ICD-10-CM

## 2012-05-12 ENCOUNTER — Emergency Department (HOSPITAL_COMMUNITY): Payer: BC Managed Care – PPO

## 2012-05-12 ENCOUNTER — Encounter (HOSPITAL_COMMUNITY): Payer: Self-pay | Admitting: Emergency Medicine

## 2012-05-12 ENCOUNTER — Emergency Department (HOSPITAL_COMMUNITY)
Admission: EM | Admit: 2012-05-12 | Discharge: 2012-05-12 | Disposition: A | Discharge status: Home / Self Care | Payer: BC Managed Care – PPO | Attending: Emergency Medicine | Admitting: Emergency Medicine

## 2012-05-12 DIAGNOSIS — S233XXA Sprain of ligaments of thoracic spine, initial encounter: Secondary | ICD-10-CM

## 2012-05-12 DIAGNOSIS — R109 Unspecified abdominal pain: Secondary | ICD-10-CM | POA: Insufficient documentation

## 2012-05-12 DIAGNOSIS — M25519 Pain in unspecified shoulder: Secondary | ICD-10-CM | POA: Insufficient documentation

## 2012-05-12 DIAGNOSIS — X58XXXA Exposure to other specified factors, initial encounter: Secondary | ICD-10-CM | POA: Insufficient documentation

## 2012-05-12 DIAGNOSIS — S29012A Strain of muscle and tendon of back wall of thorax, initial encounter: Secondary | ICD-10-CM

## 2012-05-12 DIAGNOSIS — Z85038 Personal history of other malignant neoplasm of large intestine: Secondary | ICD-10-CM | POA: Insufficient documentation

## 2012-05-12 DIAGNOSIS — S239XXA Sprain of unspecified parts of thorax, initial encounter: Secondary | ICD-10-CM | POA: Insufficient documentation

## 2012-05-12 DIAGNOSIS — R059 Cough, unspecified: Secondary | ICD-10-CM | POA: Insufficient documentation

## 2012-05-12 DIAGNOSIS — M79609 Pain in unspecified limb: Secondary | ICD-10-CM | POA: Insufficient documentation

## 2012-05-12 DIAGNOSIS — I10 Essential (primary) hypertension: Secondary | ICD-10-CM | POA: Insufficient documentation

## 2012-05-12 DIAGNOSIS — Z87891 Personal history of nicotine dependence: Secondary | ICD-10-CM | POA: Insufficient documentation

## 2012-05-12 DIAGNOSIS — Y939 Activity, unspecified: Secondary | ICD-10-CM | POA: Insufficient documentation

## 2012-05-12 DIAGNOSIS — M542 Cervicalgia: Secondary | ICD-10-CM | POA: Insufficient documentation

## 2012-05-12 DIAGNOSIS — E119 Type 2 diabetes mellitus without complications: Secondary | ICD-10-CM | POA: Insufficient documentation

## 2012-05-12 DIAGNOSIS — Y929 Unspecified place or not applicable: Secondary | ICD-10-CM | POA: Insufficient documentation

## 2012-05-12 IMAGING — CR DG CHEST 2V
2 series · 2 of 2 positions shown · non-contrast
Comparison: [DATE]

CLINICAL DATA: Cough

CHEST - 2 VIEW

[w chest pa]
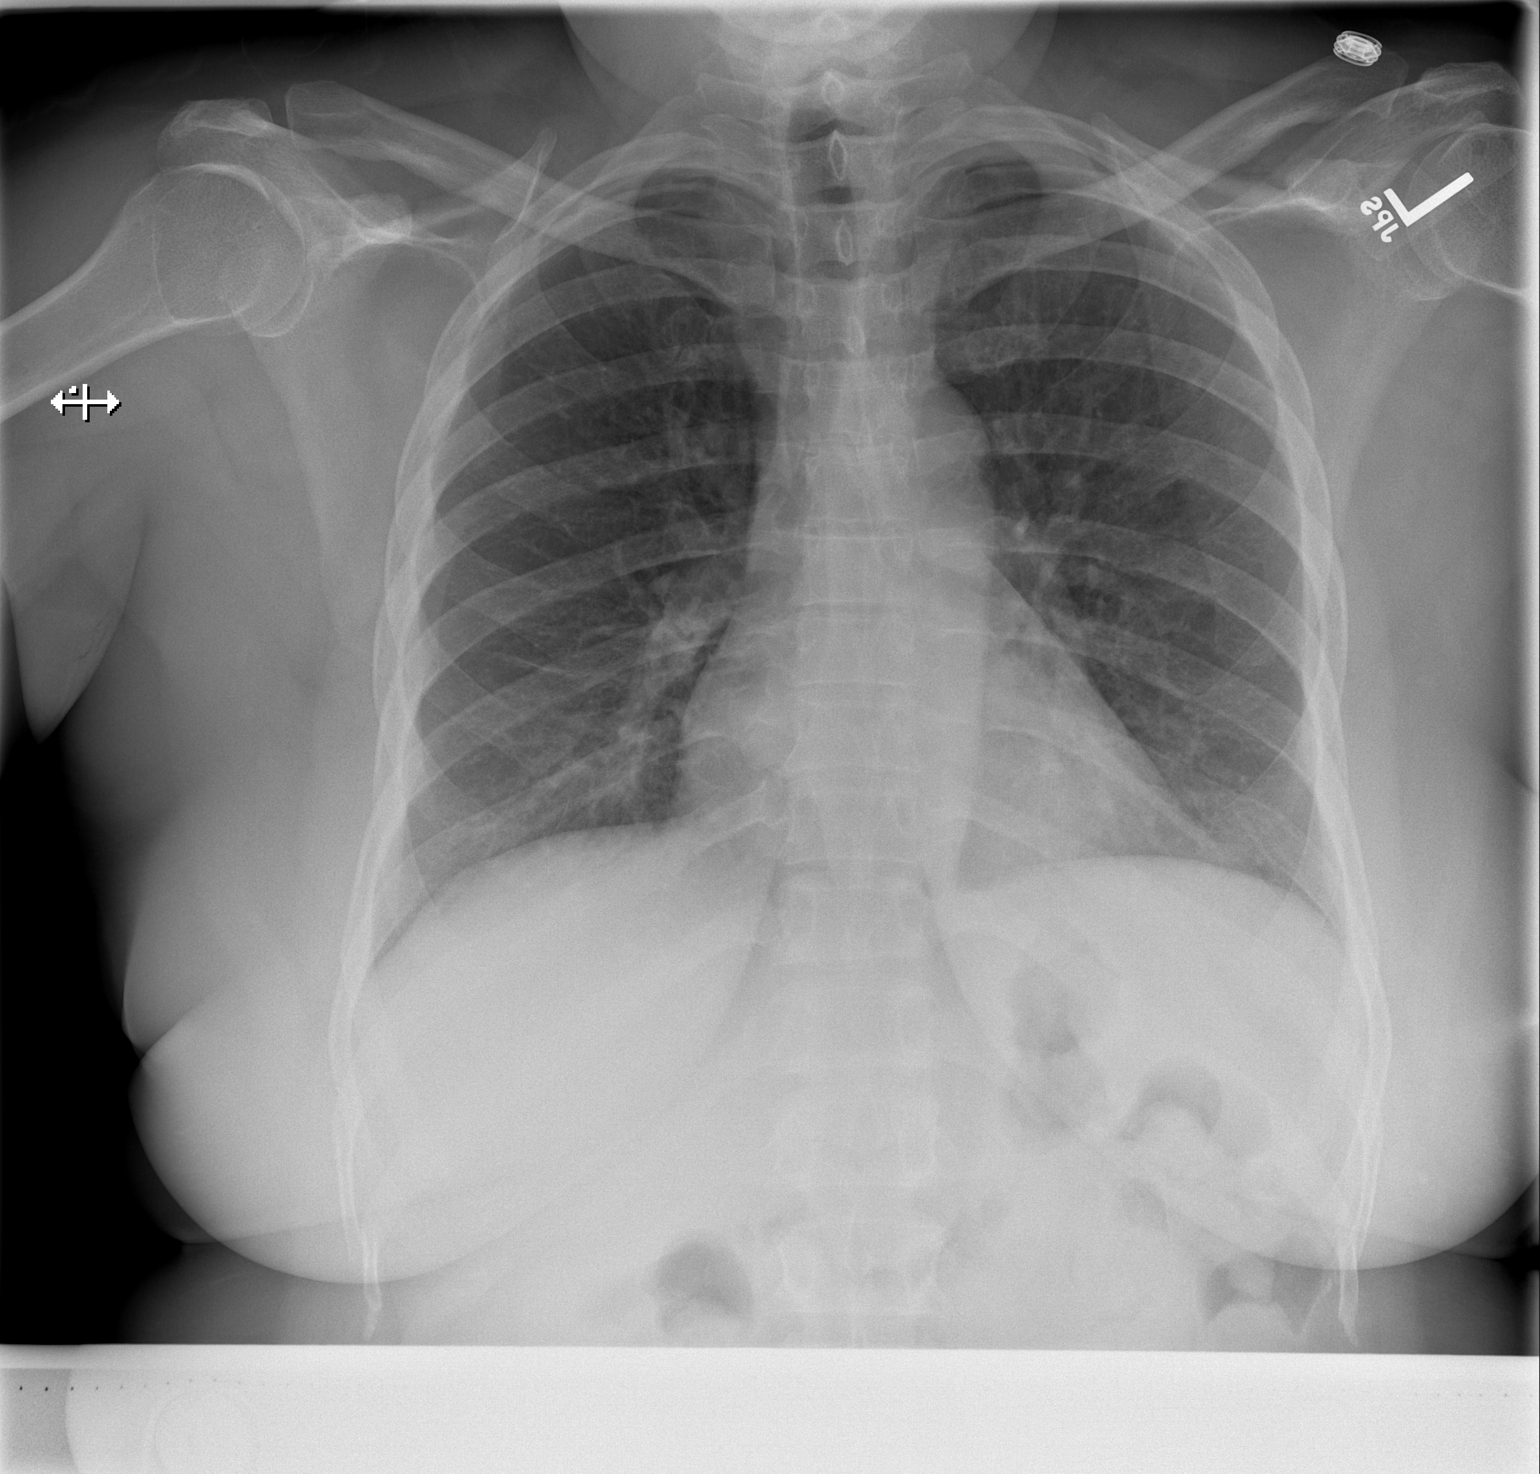

[w chest lat]
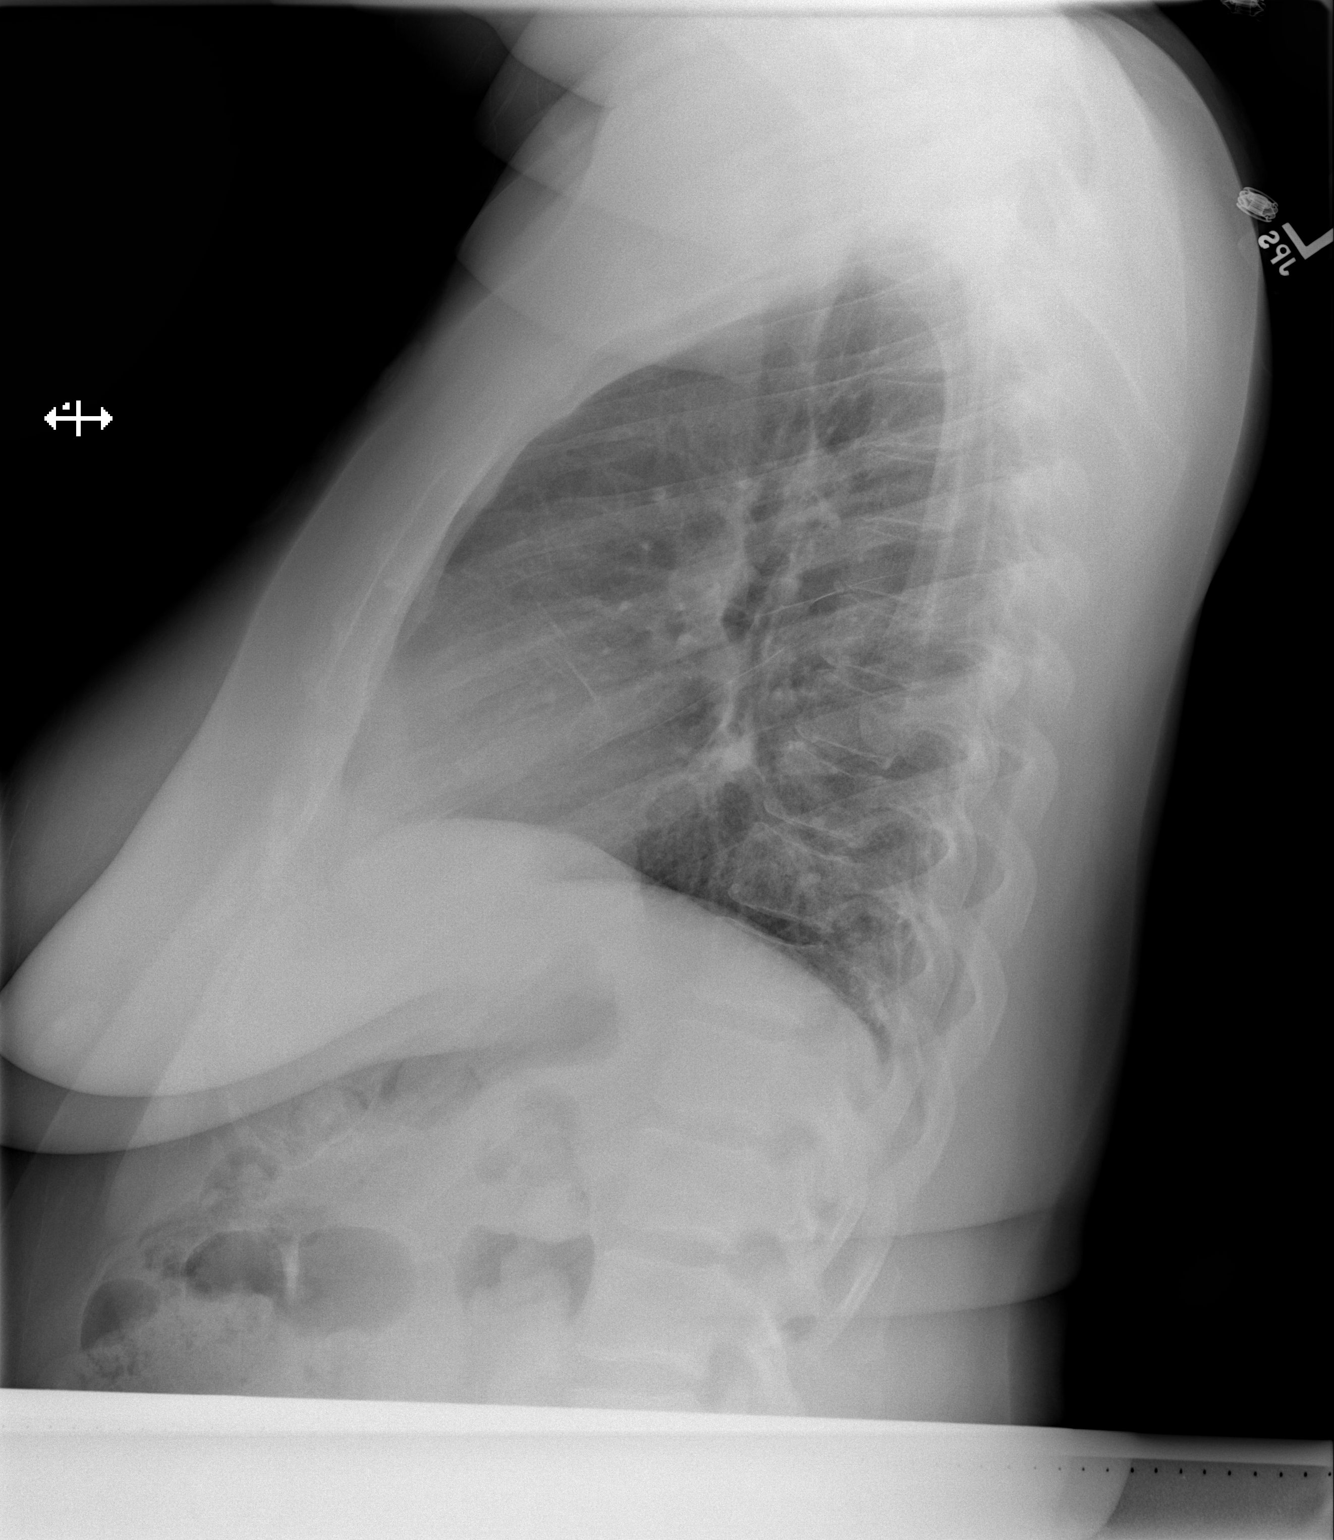

[2 of 2 positions shown; findings below may reference images not displayed]

FINDINGS: Normal heart size.  Clear lungs.  No pneumothorax.  No
pleural effusion.  No obvious acute bony deformity.
IMPRESSION: No active cardiopulmonary disease.

## 2012-05-12 MED ORDER — HYDROCODONE-ACETAMINOPHEN 5-325 MG PO TABS: ORAL_TABLET | ORAL | Status: DC

## 2012-05-12 MED ORDER — DIAZEPAM 5 MG PO TABS: 5.0000 mg | ORAL_TABLET | Freq: Three times a day (TID) | ORAL | Status: DC | PRN

## 2012-05-12 MED ORDER — KETOROLAC TROMETHAMINE 30 MG/ML IJ SOLN
30.0000 mg | Freq: Once | INTRAMUSCULAR | Status: AC
  Administered 2012-05-12: 30 mg via INTRAMUSCULAR
  Filled 2012-05-12 (×2): qty 1

## 2012-05-12 NOTE — ED Notes (Signed)
Pt c/o left arm pain that runs from neck down left arm; pt sts non productive cough x 3 days

## 2012-05-12 NOTE — ED Provider Notes (Signed)
History     CSN: [id number]  Arrival date & time 05/12/12  0830   First MD Initiated Contact with Patient 05/12/12 1132      Chief Complaint  Patient presents with  . Arm Pain  . Cough    (Consider location/radiation/quality/duration/timing/severity/associated sxs/prior treatment) HPI Comments: Patient reports since waking up yesterday she's had a pain that begins at her upper and posterior left shoulder where it meets the base of her neck and runs down her left arm and down her left flank area. She reports palpation and movement of her left shoulder makes the pain worse. She has a little difficulty turning her neck but is able to do so. She denies numbness, tingling, weakness of her upper or lower extremities. She denies chest pain, shortness of breath. She does have a significant history of diabetes, hypertension. She denies any pleuritic chest pain or back pain. She denies a skin rash. She did take some ibuprofen with no significant relief. She seemed somewhat concerned about her heart and therefore came to the emergency department. She reports a mild dry cough, but no fevers, chills, nausea or vomiting. She denies any urinary symptoms.  Patient is a 52 y.o. female presenting with arm pain and cough. The history is provided by the patient.  Arm Pain Pertinent negatives include no chest pain and no shortness of breath.  Cough Associated symptoms: no chest pain, no chills, no fever, no rash, no shortness of breath and no wheezing     Past Medical History  Diagnosis Date  . Cancer     rectal ca  . Colon cancer     Past Surgical History  Procedure Laterality Date  . Colon surgery    . Colonoscopy    . Polypectomy    . Colon cancer      Family History  Problem Relation Age of Onset  . Colon cancer Maternal Grandmother   . Colon cancer Maternal Aunt     History  Substance Use Topics  . Smoking status: Former Smoker -- 0.25 packs/day for 13 years    Types: Cigarettes   Quit date: 03/14/2003  . Smokeless tobacco: Never Used  . Alcohol Use: 0.6 oz/week    1 Glasses of wine per week    OB History   Grav Para Term Preterm Abortions TAB SAB Ect Mult Living                  Review of Systems  Constitutional: Negative for fever and chills.  HENT: Positive for neck pain.   Respiratory: Positive for cough. Negative for chest tightness, shortness of breath and wheezing.   Cardiovascular: Negative for chest pain, palpitations and leg swelling.  Genitourinary: Positive for flank pain. Negative for dysuria and frequency.  Musculoskeletal: Positive for back pain and arthralgias. Negative for joint swelling.  Skin: Negative for rash and wound.  Neurological: Negative for weakness and numbness.  All other systems reviewed and are negative.    Allergies  Epinephrine and Lidocaine  Home Medications   Current Outpatient Rx  Name  Route  Sig  Dispense  Refill  . diazepam (VALIUM) 5 MG tablet   Oral   Take 1 tablet (5 mg total) by mouth every 8 (eight) hours as needed (muscle spasms).   15 tablet   0   . HYDROcodone-acetaminophen (NORCO/VICODIN) 5-325 MG per tablet      1-2 tablets po q 6 hours prn moderate to severe pain   20 tablet   0   .  ibuprofen (ADVIL,MOTRIN) 200 MG tablet   Oral   Take 200 mg by mouth every 6 (six) hours as needed.           . Pseudoephedrine HCl (SINUS & ALLERGY 12 HOUR PO)   Oral   Take 1 tablet by mouth every 4 (four) hours as needed.             BP 140/73  Pulse 82  Temp(Src) 98.2 F (36.8 C)  Resp 19  SpO2 97%  Physical Exam  Nursing note and vitals reviewed. Constitutional: She appears well-developed and well-nourished. No distress.  HENT:  Head: Normocephalic and atraumatic.  Eyes: Pupils are equal, round, and reactive to light. No scleral icterus.  Neck: Normal range of motion. Neck supple. No JVD present. Muscular tenderness present. No spinous process tenderness present. No Brudzinski's sign and  no Kernig's sign noted.  Cardiovascular: Normal rate and regular rhythm.   Pulmonary/Chest: Effort normal. No respiratory distress. She has no wheezes.  Abdominal: Soft. She exhibits no distension. There is no tenderness. There is no rebound and no guarding.  Musculoskeletal:       Cervical back: She exhibits no bony tenderness.       Thoracic back: She exhibits tenderness. She exhibits no bony tenderness.       Back:  Neurological: She is alert.  Skin: Skin is warm and dry. No rash noted. She is not diaphoretic.    ED Course  Procedures (including critical care time)  Labs Reviewed - No data to display Dg Chest 2 View  05/12/2012  *RADIOLOGY REPORT*  Clinical Data: Cough  CHEST - 2 VIEW  Comparison: 10/31/2010  Findings: Normal heart size.  Clear lungs.  No pneumothorax.  No pleural effusion.  No obvious acute bony deformity.  IMPRESSION: No active cardiopulmonary disease.   Original Report Authenticated By: Jolaine Click, M.D.      1. Thoracic sprain and strain, initial encounter     EKG performed at time of 08:38, shows normal sinus rhythm at a rate of 78. Axis is normal. Minimal voltage criteria for LVH is noted. No ST or T-wave abnormalities. No prior EKG is available. Interpretation is borderline EKG with no ischemic changes.  Pulse ox on room air is 97% which I interpret to be normal.  MDM   Patient's symptoms are clearly reproducible and seems isolated to her upper trapezius and involving part of her latissimus along the left side. No distal weakness, good peripheral pulses and normal cap refill. Gross sensation is intact. Patient requires anti-inflammatories as well as stronger analgesics and likely muscle relaxants. Patient is reassured that this is noncardiac and that her EKG shows no ischemia.        Gavin Pound. Orenthal Debski, MD 05/12/12 1212

## 2012-05-19 ENCOUNTER — Ambulatory Visit: Payer: BC Managed Care – PPO

## 2012-05-21 ENCOUNTER — Ambulatory Visit
Admission: RE | Admit: 2012-05-21 | Discharge: 2012-05-21 | Disposition: A | Discharge status: Home / Self Care | Payer: BC Managed Care – PPO | Source: Ambulatory Visit

## 2012-05-21 DIAGNOSIS — Z1231 Encounter for screening mammogram for malignant neoplasm of breast: Secondary | ICD-10-CM

## 2012-06-02 ENCOUNTER — Ambulatory Visit (INDEPENDENT_AMBULATORY_CARE_PROVIDER_SITE_OTHER): Payer: BC Managed Care – PPO | Admitting: Family Medicine

## 2012-06-02 ENCOUNTER — Encounter: Payer: Self-pay | Admitting: Family Medicine

## 2012-06-02 ENCOUNTER — Ambulatory Visit: Payer: Self-pay | Admitting: Family Medicine

## 2012-06-02 VITALS — BP 130/80 | HR 84 | Temp 97.8°F | Ht 60.75 in | Wt 191.0 lb

## 2012-06-02 DIAGNOSIS — N390 Urinary tract infection, site not specified: Secondary | ICD-10-CM

## 2012-06-02 DIAGNOSIS — M5380 Other specified dorsopathies, site unspecified: Secondary | ICD-10-CM

## 2012-06-02 DIAGNOSIS — M62830 Muscle spasm of back: Secondary | ICD-10-CM

## 2012-06-02 DIAGNOSIS — G5620 Lesion of ulnar nerve, unspecified upper limb: Secondary | ICD-10-CM

## 2012-06-02 DIAGNOSIS — G5622 Lesion of ulnar nerve, left upper limb: Secondary | ICD-10-CM

## 2012-06-02 DIAGNOSIS — R350 Frequency of micturition: Secondary | ICD-10-CM

## 2012-06-02 LAB — POCT URINALYSIS DIPSTICK
Glucose, UA: NEGATIVE
Nitrite, UA: POSITIVE
Urobilinogen, UA: NEGATIVE

## 2012-06-02 MED ORDER — SULFAMETHOXAZOLE-TMP DS 800-160 MG PO TABS: 1.0000 | ORAL_TABLET | Freq: Two times a day (BID) | ORAL | Status: DC

## 2012-06-02 MED ORDER — NAPROXEN 500 MG PO TABS: 500.0000 mg | ORAL_TABLET | Freq: Two times a day (BID) | ORAL | Status: DC

## 2012-06-02 NOTE — Progress Notes (Signed)
Chief Complaint  Patient presents with  . Wrist Pain    left arm from wrist to elbow and today up to her shoulder. Was seen in ER 05/12/12 for this.   . Urinary Tract Infection    first noticed that her urine was discolored, over the past 3 days has experienced some urinary pressure and frequency. UA was abnormal.    Pain in left shoulder started 3 weeks ago, and at that time also radiated into her left back.  Went to ER--had EKG, x-rays, and was felt to be muscular (involving trapezius and latissimus dorsi).  She was prescribed pain meds, muscle relaxant (valium).  Felt terrible from the valium--too sedating, and bothered her stomach, queasy/nauseated.  Took just 1 Valium, and the next night she tried the hydrocodone--she didn't like that either, felt just as bad the next day, very tired, worse.  Currently complaining of pain in her left arm.  Neck is feeling better.  She is having numbness in her left forearm (medially only).  Hands are numb, had trouble braiding her granddaughter's hair.  Having tingling in 3rd through 5th fingers currently.  Also feels weak in those fingers.  She is having much less pain in her shoulder (trapezius).  Taking ibuprofen--2 pills twice a day, eases the pain some, but takes a while.  Didn't bother her stomach.  Urine has been cloudy for 3 days, and having slight urinary frequency, and some pressure/discomfort in bladder when voiding. Denies flank pain, fevers.  Denies nausea, vomiting, flank pain.  Past Medical History  Diagnosis Date  . Cancer     rectal ca  . Colon cancer    Past Surgical History  Procedure Laterality Date  . Colon surgery    . Colonoscopy    . Polypectomy    . Colon cancer     History   Social History  . Marital Status: Single    Spouse Name: N/A    Number of Children: N/A  . Years of Education: N/A   Occupational History  . Not on file.   Social History Main Topics  . Smoking status: Former Smoker -- 0.25 packs/day for 13  years    Types: Cigarettes    Quit date: 03/14/2003  . Smokeless tobacco: Never Used  . Alcohol Use: 0.6 oz/week    1 Glasses of wine per week  . Drug Use: No  . Sexually Active: Not on file   Other Topics Concern  . Not on file   Social History Narrative  . No narrative on file   Meds: Not taking any meds (prescribed by ER 2 weeks ago for left shoulder pain). Allergies  Allergen Reactions  . Epinephrine Anaphylaxis  . Lidocaine Anaphylaxis   ROS:  Denies headaches, dizziness, chest pain, nausea, vomiting, diarrhea, skin rash, back pain, bleeding bruising, edema.  +urinary complaints.  +numbness/tingling/arm pain.  Denies URI symtpoms or other concerns  PHYSICAL EXAM: BP 130/80  Pulse 84  Temp(Src) 97.8 F (36.6 C) (Oral)  Ht 5' 0.75" (1.543 m)  Wt 191 lb (86.637 kg)  BMI 36.39 kg/m2 Well developed female, appears in mild discomfort, very jumpy--afraid of exam causing pain Spine: Tender at C6, and upper thoracic spine.  No CVA tenderness Neck: no lymphadenopathy or thyromegaly or mass Heart: regular rate and rhythm Lungs: clear Back: Tender with spasm over L trapezius muscle Strength--slightly decreased with finger abduction on left.  Very jumpy (anticipating pain/discomfort) during exam.  Normal strength otherwise, normal reflexes. Subjective decrease in sensation/tingling  medially over forearm, and extending into L 4th and 5th fingers, part of the middle finger.  Thumb and index finger currently feel normal.  Tender at ulnar nerve on left at elbow 2+ pulses Abdomen: Mild suprapubic tenderness. No CVA tenderness.  No organomegaly or mass Psych: normal mood, affect, hygiene and grooming  Urine dip:  3+ leuks, 3+ blood, 2+ protein  ASSESSMENT/PLAN: UTI (urinary tract infection) - Plan: Urine culture, sulfamethoxazole-trimethoprim (BACTRIM DS) 800-160 MG per tablet  Urinary frequency - Plan: POCT Urinalysis Dipstick  Ulnar neuropathy, left - Plan: naproxen (NAPROSYN)  500 MG tablet  Muscle spasm of back - left trapezius muscle - Plan: naproxen (NAPROSYN) 500 MG tablet  Left Ulnar neuropathy, vs cervical radiculopathy Also has tenderness and spasm in L trapezius. Treat with NSAIDS--risks/side effects reviewed. Avoid resting elbows on hard surface (rest on pillow). Heat, stretches, massage to back/trapezius  UTI--risks/side effects of meds reviewed.  Return if symptoms persist, worsen, develops flank pain, fever, nausea, vomiting  Return in 2 weeks if not improved. Call for prescription for steroid if no improvement at all with naproxen

## 2012-06-02 NOTE — Patient Instructions (Addendum)
Take the naproxen twice daily with food, regularly for up to the full 15 days.  You may stop after 7-10 days if you are completely better.  Cut the dose in half if it bothers your stomach.  Do NOT take ibuprofen or aspirin along with it.  You MAY take tylenol along with it. You MAY use the hydrocodone from the ER if needed for severe pain. You MAY use the valium (1/2 tablet) if needed for pain from muscle spasm in your upper back/shoulder. Use heat, massage and neck stretches to also help with muscle spasm.  Avoid leaning your elbows on hard surfaces--use a pillow if needed  Return in 2 weeks if not improved. Call for prescription for steroid if no improvement at all, and you'd like to try in place of the naproxen (remember side effects we discussed).

## 2012-06-03 ENCOUNTER — Ambulatory Visit: Payer: Self-pay | Admitting: Family Medicine

## 2012-06-04 LAB — URINE CULTURE: Colony Count: >=100000

## 2012-10-23 ENCOUNTER — Telehealth: Payer: Self-pay | Admitting: *Deleted

## 2012-10-23 NOTE — Telephone Encounter (Signed)
Message from pt asking if it is necessary that she keep appt 8/25 for lab and 8/28 office visit? Stated "it's been seven years, do I need to keep coming up there?" Question forwarded to Dr. Truett Perna.

## 2012-10-24 NOTE — Telephone Encounter (Signed)
Returned call to pt, per Dr. Truett Perna: can follow up here or with primary MD. Dr. Truett Perna recommends yearly CEA and colonoscopy. Pt states she would rather follow up with PCP. Canceled [facility] appts.

## 2012-10-27 ENCOUNTER — Ambulatory Visit (HOSPITAL_COMMUNITY)
Admission: RE | Admit: 2012-10-27 | Discharge: 2012-10-27 | Disposition: A | Discharge status: Home / Self Care | Payer: BC Managed Care – PPO | Source: Ambulatory Visit | Attending: Hematology and Oncology | Admitting: Hematology and Oncology

## 2012-10-27 ENCOUNTER — Other Ambulatory Visit (HOSPITAL_COMMUNITY): Payer: BC Managed Care – PPO

## 2012-10-27 ENCOUNTER — Other Ambulatory Visit: Payer: BC Managed Care – PPO | Admitting: Lab

## 2012-10-29 ENCOUNTER — Ambulatory Visit: Payer: BC Managed Care – PPO | Admitting: Hematology and Oncology

## 2012-10-30 ENCOUNTER — Ambulatory Visit: Payer: BC Managed Care – PPO | Admitting: Oncology

## 2012-12-23 ENCOUNTER — Ambulatory Visit (INDEPENDENT_AMBULATORY_CARE_PROVIDER_SITE_OTHER): Payer: BC Managed Care – PPO | Admitting: Family Medicine

## 2012-12-23 ENCOUNTER — Other Ambulatory Visit: Payer: Self-pay | Admitting: Family Medicine

## 2012-12-23 ENCOUNTER — Other Ambulatory Visit (HOSPITAL_COMMUNITY)
Admission: RE | Admit: 2012-12-23 | Discharge: 2012-12-23 | Disposition: A | Discharge status: Home / Self Care | Payer: BC Managed Care – PPO | Source: Ambulatory Visit | Attending: Family Medicine | Admitting: Family Medicine

## 2012-12-23 ENCOUNTER — Encounter: Payer: Self-pay | Admitting: Family Medicine

## 2012-12-23 VITALS — BP 118/80 | HR 70 | Ht 61.0 in | Wt 190.0 lb

## 2012-12-23 DIAGNOSIS — E669 Obesity, unspecified: Secondary | ICD-10-CM

## 2012-12-23 DIAGNOSIS — M25569 Pain in unspecified knee: Secondary | ICD-10-CM

## 2012-12-23 DIAGNOSIS — Z23 Encounter for immunization: Secondary | ICD-10-CM

## 2012-12-23 DIAGNOSIS — Z0000 Encounter for general adult medical examination without abnormal findings: Secondary | ICD-10-CM

## 2012-12-23 DIAGNOSIS — C189 Malignant neoplasm of colon, unspecified: Secondary | ICD-10-CM

## 2012-12-23 DIAGNOSIS — Z01419 Encounter for gynecological examination (general) (routine) without abnormal findings: Secondary | ICD-10-CM | POA: Insufficient documentation

## 2012-12-23 DIAGNOSIS — M25561 Pain in right knee: Secondary | ICD-10-CM

## 2012-12-23 LAB — CBC WITH DIFFERENTIAL/PLATELET
Basophils Absolute: 0 10*3/uL (ref 0.0–0.1)
Eosinophils Absolute: 0.1 10*3/uL (ref 0.0–0.7)
Eosinophils Relative: 2 % (ref 0–5)
HCT: 38.2 % (ref 36.0–46.0)
Lymphocytes Relative: 45 % (ref 12–46)
MCH: 22.1 pg — ABNORMAL LOW (ref 26.0–34.0)
MCV: 69.1 fL — ABNORMAL LOW (ref 78.0–100.0)
Monocytes Absolute: 0.3 10*3/uL (ref 0.1–1.0)
Platelets: 270 10*3/uL (ref 150–400)
RDW: 16.6 % — ABNORMAL HIGH (ref 11.5–15.5)
WBC: 3.3 10*3/uL — ABNORMAL LOW (ref 4.0–10.5)

## 2012-12-23 LAB — COMPREHENSIVE METABOLIC PANEL
AST: 13 U/L (ref 0–37)
BUN: 11 mg/dL (ref 6–23)
CO2: 26 mEq/L (ref 19–32)
Calcium: 9.2 mg/dL (ref 8.4–10.5)
Chloride: 102 mEq/L (ref 96–112)
Creat: 0.9 mg/dL (ref 0.50–1.10)
Total Bilirubin: 0.4 mg/dL (ref 0.3–1.2)

## 2012-12-23 LAB — LIPID PANEL
Cholesterol: 250 mg/dL — ABNORMAL HIGH (ref 0–200)
HDL: 37 mg/dL — ABNORMAL LOW (ref >39)
Total CHOL/HDL Ratio: 6.8 Ratio
VLDL: 31 mg/dL (ref 0–40)

## 2012-12-23 LAB — HM PAP SMEAR: HM Pap smear: NEGATIVE

## 2012-12-23 NOTE — Progress Notes (Signed)
Subjective:    Patient ID: [name] [name], female    DOB: November 17, 1960, 52 y.o.   MRN: [id number]  HPI She is here for complete examination. She does complain of right knee pain for the last year. She states that it hurts when she gets up in the morning but gets better as the day goes on. If she sits for long period time her her knee will again hurt her. No popping, locking or grinding. She does walk every day for 20 minutes all at work. She also states that she has had intermittent foot pain and originally got a disability parking sticker for this. She also has a history of colon cancer and gets routine followup. She has not followed up with her oncologist yet this year. She states that she does have a life coach through work and has used [employer] for the last several years however her weight has not changed. Family and social history were reviewed.   Review of Systems  Constitutional: Negative.   HENT: Negative.   Respiratory: Negative.   Cardiovascular: Negative.   Gastrointestinal: Negative.   Endocrine: Negative.   Genitourinary: Negative.   Musculoskeletal: Negative.   Allergic/Immunologic: Negative.   Neurological: Negative.   Hematological: Negative.   Psychiatric/Behavioral: Negative.        Objective:   Physical Exam BP 118/80  Pulse 70  Ht 5\' 1"  (1.549 m)  Wt 190 lb (86.183 kg)  BMI 35.92 kg/m2  General Appearance:    Alert, cooperative, no distress, appears stated age  Head:    Normocephalic, without obvious abnormality, atraumatic  Eyes:    PERRL, conjunctiva/corneas clear, EOM's intact, fundi    benign  Ears:    Normal TM's and external ear canals  Nose:   Nares normal, mucosa normal, no drainage or sinus   tenderness  Throat:   Lips, mucosa, and tongue normal; teeth and gums normal  Neck:   Supple, no lymphadenopathy;  thyroid:  no   enlargement/tenderness/nodules; no carotid   bruit or JVD  Back:    Spine nontender, no curvature, ROM normal, no CVA      tenderness  Lungs:     Clear to auscultation bilaterally without wheezes, rales or     ronchi; respirations unlabored  Chest Wall:    No tenderness or deformity   Heart:    Regular rate and rhythm, S1 and S2 normal, no murmur, rub   or gallop  Breast Exam:   not done   Abdomen:     Soft, non-tender, nondistended, normoactive bowel sounds,    no masses, no hepatosplenomegaly  Genitalia:    Normal external genitalia without lesions.  BUS and vagina normal; cervix without lesions, or cervical motion tenderness. No abnormal vaginal discharge.  Uterus and adnexa not enlarged, nontender, no masses.  Pap performed  Rectal:    Not performed due to age<40 and no related complaints  Extremities:   No clubbing, cyanosis or edema.exam of the right knee shows no effusion. No tenderness over the patella or patellar tendon. No joint line tenderness. McMurray's testing did cause some discomfort medially. Anterior drawer negative.   Pulses:   2+ and symmetric all extremities  Skin:   Skin color, texture, turgor normal, no rashes or lesions  Lymph nodes:   Cervical, supraclavicular, and axillary nodes normal  Neurologic:   CNII-XII intact, normal strength, sensation and gait; reflexes 2+ and symmetric throughout          Psych:   Normal mood, affect,  hygiene and grooming.        Assessment & Plan:  Routine general medical examination at a health care facility - Plan: CBC with Differential, Comprehensive metabolic panel, Lipid panel, Cytology - PAP [place]  Need for prophylactic vaccination and inoculation against influenza - Plan: Flu Vaccine QUAD 36+ mos PF IM (Fluarix)  Right knee pain - Plan: DG Knee Complete 4 Views Right  Colon cancer - Plan: CEA, CBC with Differential, Comprehensive metabolic panel  Obesity (BMI 30-39.9) - Plan: CBC with Differential, Comprehensive metabolic panel, Lipid panel  discussed diet and exercise with her. Encouraged her to get more involved with her life coach. I did  not fully address a disability parking pass but says she can walk for 20 minutes every day, it would be difficult for me to sign a form. Flu shot given with risks and benefits discussed

## 2012-12-29 NOTE — Progress Notes (Signed)
Quick Note:  LEFT MESSAGE FOR PT TO MAKE APPOINTMENT MAILED COPY OF LABS TO PT ______

## 2012-12-30 ENCOUNTER — Ambulatory Visit (INDEPENDENT_AMBULATORY_CARE_PROVIDER_SITE_OTHER): Payer: BC Managed Care – PPO | Admitting: Family Medicine

## 2012-12-30 DIAGNOSIS — E119 Type 2 diabetes mellitus without complications: Secondary | ICD-10-CM

## 2012-12-30 DIAGNOSIS — E785 Hyperlipidemia, unspecified: Secondary | ICD-10-CM

## 2012-12-30 DIAGNOSIS — E1169 Type 2 diabetes mellitus with other specified complication: Secondary | ICD-10-CM | POA: Insufficient documentation

## 2012-12-30 DIAGNOSIS — E118 Type 2 diabetes mellitus with unspecified complications: Secondary | ICD-10-CM | POA: Insufficient documentation

## 2012-12-30 DIAGNOSIS — E669 Obesity, unspecified: Secondary | ICD-10-CM

## 2012-12-30 MED ORDER — ATORVASTATIN CALCIUM 20 MG PO TABS: 20.0000 mg | ORAL_TABLET | Freq: Every day | ORAL | Status: DC

## 2012-12-30 MED ORDER — GLUCOSE BLOOD VI STRP: ORAL_STRIP | Status: DC

## 2012-12-30 MED ORDER — ONETOUCH ULTRASOFT LANCETS MISC: Status: DC

## 2012-12-30 MED ORDER — ONETOUCH VERIO IQ SYSTEM W/DEVICE KIT: 1.0000 | PACK | Freq: Every day | Status: DC

## 2012-12-30 NOTE — Progress Notes (Signed)
  Subjective:    Patient ID: [name] [name], female    DOB: 09/03/1960, 52 y.o.   MRN: [id number]  HPI She is here for consultation. Blood work was discussed with her.   Review of Systems     Objective:   Physical Exam Alert and in no distress. Her MCV is slightly low. Hemoglobin A1c is 6.9. Lipid panel is elevated.       Assessment & Plan:  Diabetes mellitus, new onset - Plan: Amb Referral to Nutrition and Diabetic E, atorvastatin (LIPITOR) 20 MG tablet  Obesity (BMI 30-39.9)  Hyperlipidemia LDL goal < 70  I discussed the fact that now she has diabetes. I discussed diet and exercise with her. On encouraged her to continue with her present exercise regimen which is 20 minutes every day. Also explained that I could not fill out her disability forms and she is walking and has no other factors for justifying this. Recommend multivitamin with iron due to her low MCV. Discussed placing her on a statin drug and possible side effects. Recommended to different websites for her to go to concerning educating. Will also refer her to diabetes education classes. Discussed diabetes in regard to stroke, blindness, heart disease, kidney failure, peripheral vascular and neurologic complications. We will also teach her how to use a glucometer. Recheck here in one month. She is to call if she has any difficulties with the Lipitor.

## 2012-12-30 NOTE — Patient Instructions (Addendum)
Go to the American diabetes association website or my [email or web address] Take a good multivitamin. Keep up walking You don't hear from the diabetes and dietitian within 2 weeks call us.

## 2012-12-31 ENCOUNTER — Telehealth: Payer: Self-pay | Admitting: Family Medicine

## 2012-12-31 ENCOUNTER — Ambulatory Visit
Admission: RE | Admit: 2012-12-31 | Discharge: 2012-12-31 | Disposition: A | Discharge status: Home / Self Care | Payer: BC Managed Care – PPO | Source: Ambulatory Visit | Attending: Family Medicine | Admitting: Family Medicine

## 2012-12-31 DIAGNOSIS — M25561 Pain in right knee: Secondary | ICD-10-CM

## 2012-12-31 IMAGING — CR DG KNEE COMPLETE 4+V*R*
4 series · 4 of 4 positions shown · non-contrast
Comparison: None.

CLINICAL DATA: Right knee pain over the last year, no acute trauma

EXAM:
RIGHT KNEE - COMPLETE 4+ VIEW

[view not recorded (1 of 4)]
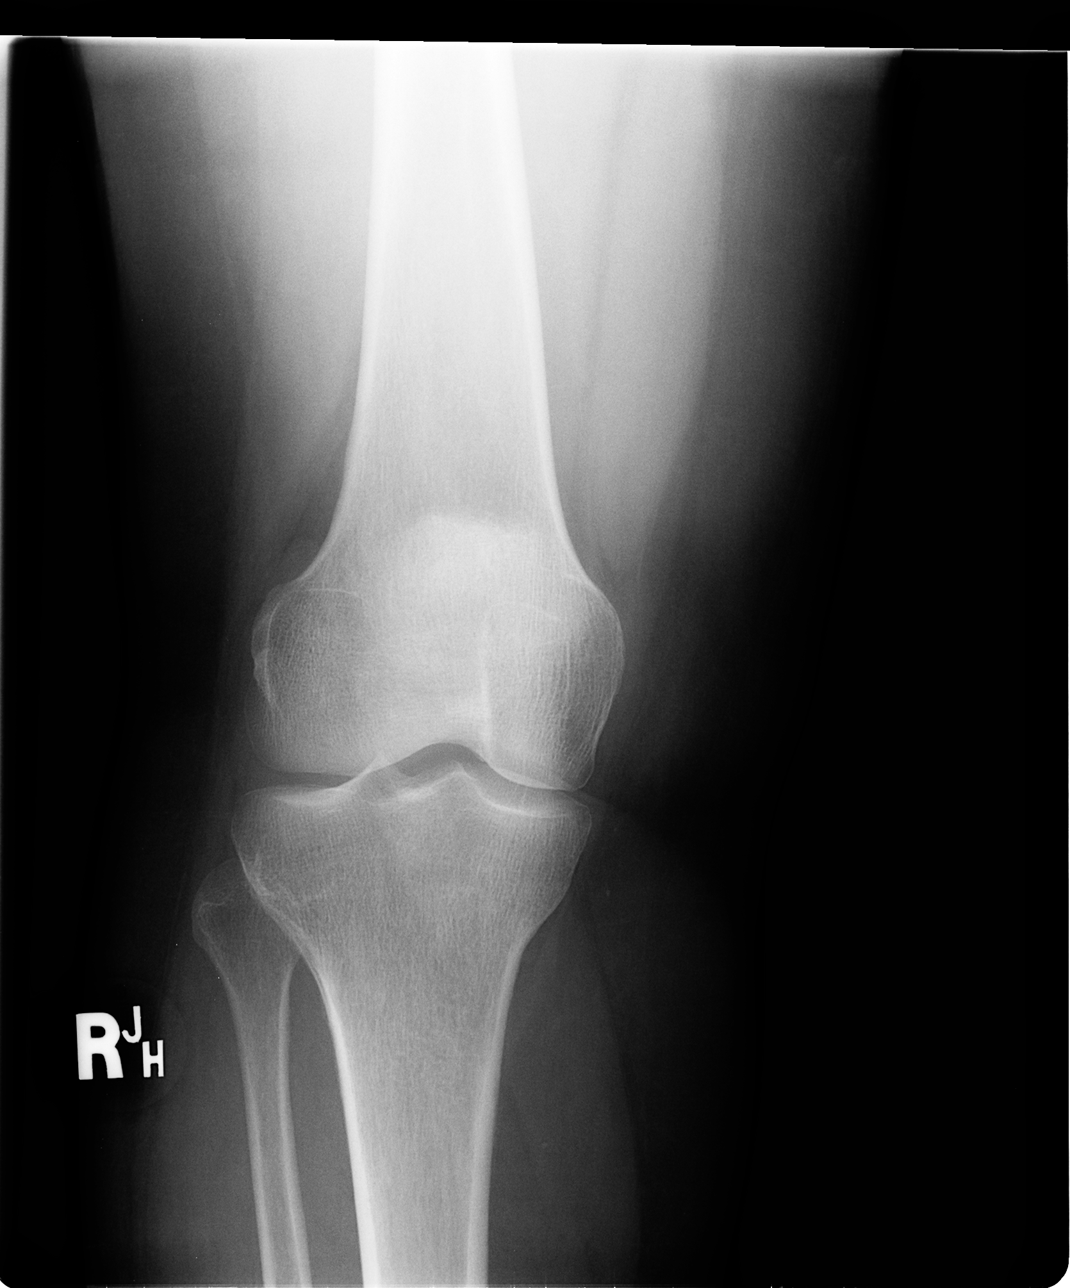

[view not recorded (2 of 4)]
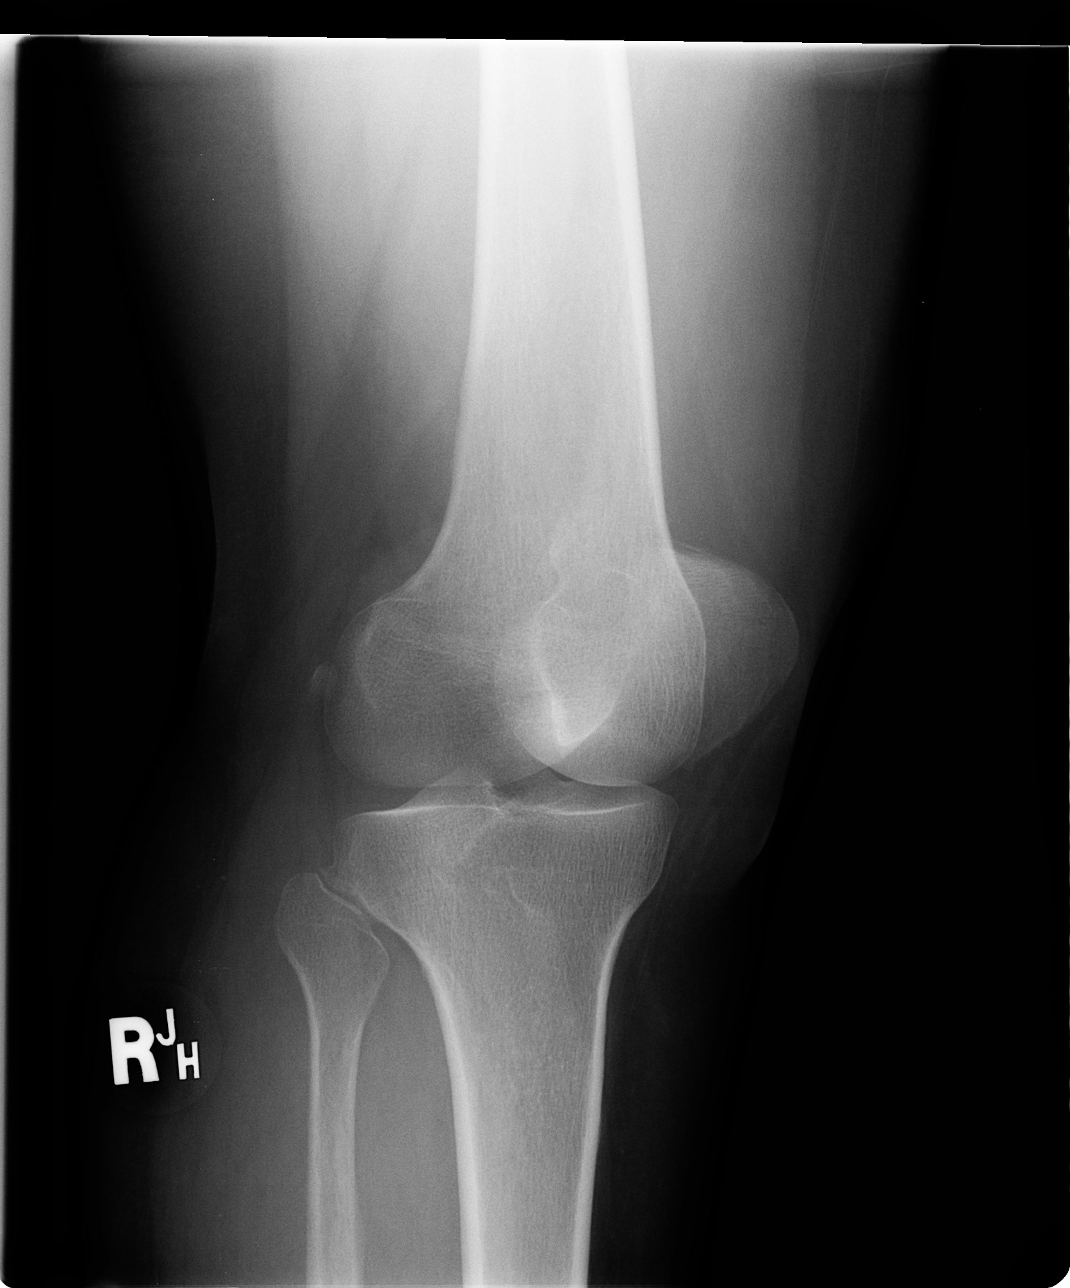

[view not recorded (3 of 4)]
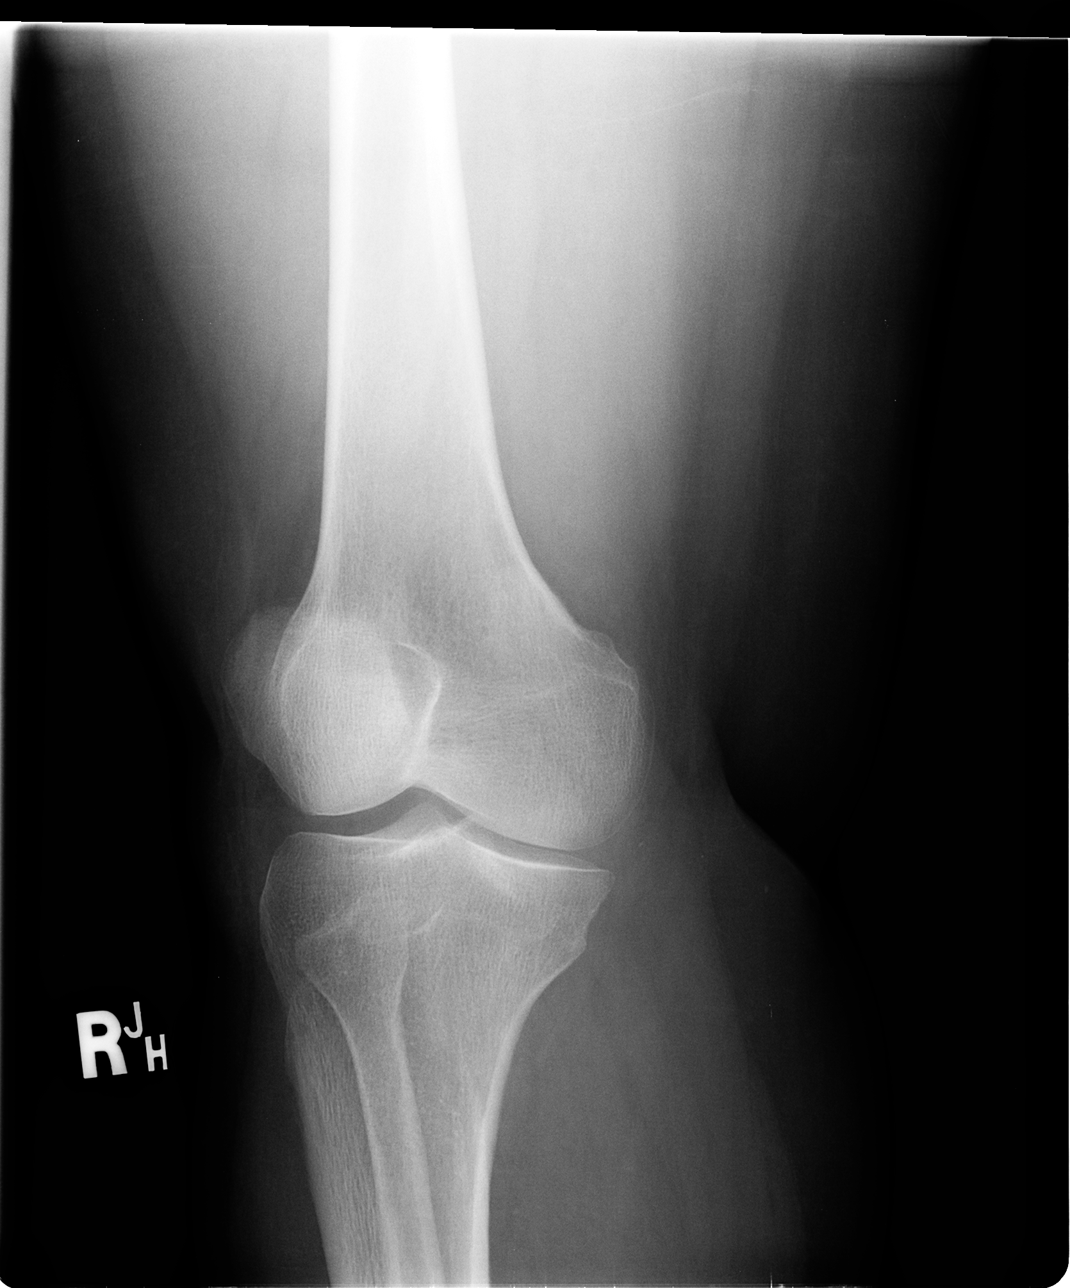

[view not recorded (4 of 4)]
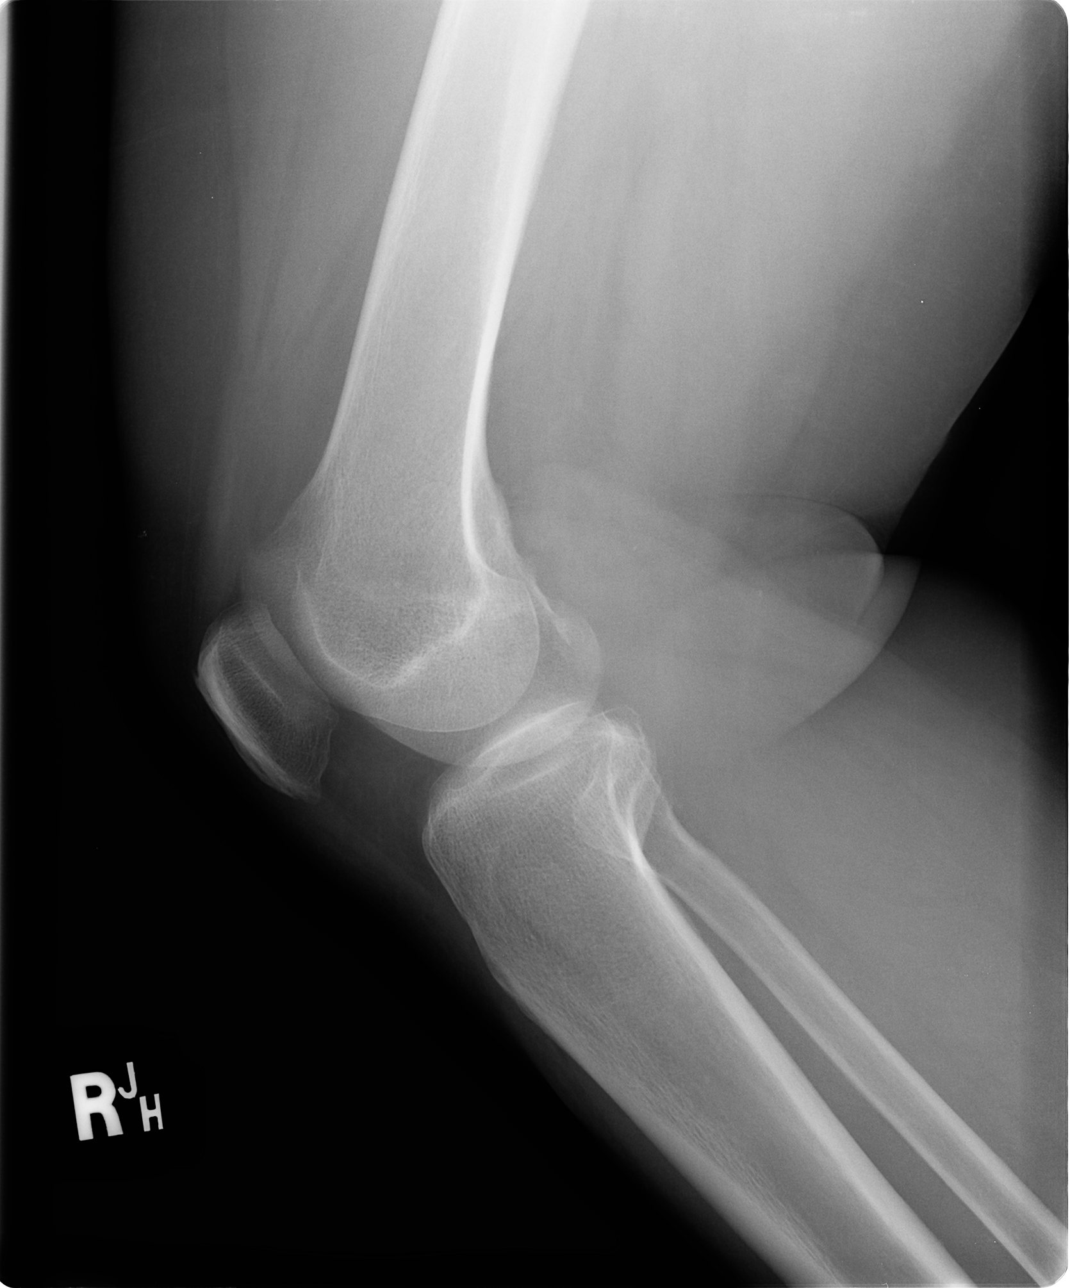

[4 of 4 positions shown; findings below may reference images not displayed]

FINDINGS: The right knee joint spaces are unremarkable. No significant
degenerative change is seen. No fracture or effusion is noted.
IMPRESSION: Negative.

## 2012-12-31 NOTE — Telephone Encounter (Signed)
CALLED PT TO LET HER KNOW THAT I GOT THE ONETOUCH VERIO AND HAVE PUT ONE UP FRONT FOR HER

## 2013-01-01 ENCOUNTER — Encounter: Payer: Self-pay | Admitting: Internal Medicine

## 2013-01-01 NOTE — Progress Notes (Signed)
Quick Note:  CALLED PT HOME/CELL # Informed patient that the x-ray shows no degenerative changes. Tell her to keep up her exercise regimen and if continued difficulty have an appointment with me for follow up PT STATED SHE WAS GOING TO GET SECOND OPINION ______

## 2013-01-22 ENCOUNTER — Telehealth: Payer: Self-pay | Admitting: Family Medicine

## 2013-01-22 NOTE — Telephone Encounter (Signed)
We discussed this with her last visit and because she is walking as much and she has been she does not qualify. Kim gave her the stickers inappropriately

## 2013-01-22 NOTE — Telephone Encounter (Signed)
Pt states that's his opinion i will get second opinion after I read her word for word of dr.lalondes note

## 2013-02-11 ENCOUNTER — Ambulatory Visit (INDEPENDENT_AMBULATORY_CARE_PROVIDER_SITE_OTHER): Payer: BC Managed Care – PPO | Admitting: Family Medicine

## 2013-02-11 VITALS — BP 148/90 | HR 92 | Temp 98.1°F | Resp 16 | Ht 60.0 in | Wt 190.2 lb

## 2013-02-11 DIAGNOSIS — J029 Acute pharyngitis, unspecified: Secondary | ICD-10-CM

## 2013-02-11 LAB — POCT RAPID STREP A (OFFICE): Rapid Strep A Screen: NEGATIVE

## 2013-02-11 MED ORDER — AZITHROMYCIN 250 MG PO TABS: ORAL_TABLET | ORAL | Status: DC

## 2013-02-11 MED ORDER — MAGIC MOUTHWASH W/LIDOCAINE: 5.0000 mL | Freq: Four times a day (QID) | ORAL | Status: DC | PRN

## 2013-02-11 NOTE — Progress Notes (Signed)
Subjective:  This chart was scribed for Elvina Sidle, MD by Carl Best, Medical Scribe. This patient was seen in Room 10 and the patient's care was started at 6:13 PM.  Patient ID: Lieutenant Diego, female    DOB: [birth year], 52 y.o.   MRN: [id number]  HPI HPI Comments: Afsana Liera is a 52 y.o. female who presents to the [facility] complaining of a constant sore throat that started two weeks ago.  She states that her symptoms started with a constant cough, sneeze, and postnasal drip.  She states that her cough has subsided.  She states that swallowing aggravates the pain.  She states that she has tried hot tea and throat lozenges for her symptoms with no relief.  The patient states that she works at [employer] and that all of her coworkers are sick.    The patient also complains of constant right knee pain that started several months ago.  She states that she had an injury to her right side several years ago.  She states that weather changes aggravate the pain in her knee.  The patient states that she has gotten imagining done but her images did not reveal anything.  She states that she has not been prescribed any medication for her pain.  She states that she has taken Ibuprofen for her symptoms with mild relief to her pain.    Past Medical History  Diagnosis Date   Cancer     rectal ca   Colon cancer    Past Surgical History  Procedure Laterality Date   Colon surgery     Colonoscopy     Polypectomy     Colon cancer     History   Social History   Marital Status: Single    Spouse Name: N/A    Number of Children: N/A   Years of Education: N/A   Occupational History   Not on file.   Social History Main Topics   Smoking status: Former Smoker -- 0.25 packs/day for 13 years    Types: Cigarettes    Quit date: 03/14/2003   Smokeless tobacco: Never Used   Alcohol Use: 0.6 oz/week    1 Glasses of wine per week   Drug Use: No    Sexual Activity: Not Currently   Other Topics Concern   Not on file   Social History Narrative   No narrative on file   Family History  Problem Relation Age of Onset   Colon cancer Maternal Grandmother    Colon cancer Maternal Aunt    Allergies  Allergen Reactions   Epinephrine Anaphylaxis   Lidocaine Anaphylaxis    Review of Systems  HENT: Positive for sore throat, trouble swallowing and voice change.   All other systems reviewed and are negative.     Objective:  Physical Exam Page appears to be a in some distress with difficulty swallowing.  HEENT: Moderate swelling of the left pharynx with erythema and no exudates Neck: Mild anterior cervical node swelling, supple, no thyromegaly Chest: Clear Heart: Regular no murmur Results for orders placed in visit on 02/11/13  POCT RAPID STREP A (OFFICE)      Result Value Range   Rapid Strep A Screen Negative  Negative     Assessment & Plan:   I personally performed the services described in this documentation, which was scribed in my presence. The recorded information has been reviewed and is accurate.  Acute pharyngitis - Plan: POCT rapid strep A,  Alum & Mag Hydroxide-Simeth (MAGIC MOUTHWASH W/LIDOCAINE) SOLN, azithromycin (ZITHROMAX Z-PAK) 250 MG tablet  Signed, Elvina Sidle, MD

## 2013-06-16 ENCOUNTER — Telehealth: Payer: Self-pay

## 2013-06-16 NOTE — Telephone Encounter (Signed)
Patient came in this evening with a form she needs signed. Patient states she was told to ask for [facility]. Pamala Hurry is not here at the moment and records do not show any documentation. We saw patient in December but not since then. I told her I would put in a message and have Pamala Hurry return her call when she is in the office. Please return call at [phone]. Patient left with form and will fax it to [place] if she needs to. Thank you.

## 2013-06-17 NOTE — Telephone Encounter (Signed)
Elmyra Ricks advised me of pt coming in last night and stated that the form pt had looked like it was for a handicapped application of some kind. Called pt and LMOM that her PCP would be most appropriate provider to fill this out since pt has only seen Dr L once and OV was primarily for sore throat. There are a few notes in OV notes about chronic knee pain. Pt called back and stated that her prev PCP at Dr Lanice Shirts office had signed for handicapped placard for many years but they left practice. Dr [name] will not sign for it for some reason. Pt stated that Dr L wrote her for a temporary placard when she was here and suggested an injection in the knee, but she felt so bad that day, she didn't want to do the inj. Dr L, are you able to approve a permanent handicapped application from your prev OV, or do you need to see pt back first? Advised pt that she should plan to see Dr soon regardless in order to get treatment for the knee.

## 2013-08-13 ENCOUNTER — Encounter: Payer: Self-pay | Admitting: Family Medicine

## 2013-08-13 ENCOUNTER — Ambulatory Visit (INDEPENDENT_AMBULATORY_CARE_PROVIDER_SITE_OTHER): Payer: BC Managed Care – PPO | Admitting: Family Medicine

## 2013-08-13 VITALS — BP 130/80 | Wt 193.0 lb

## 2013-08-13 DIAGNOSIS — M25561 Pain in right knee: Secondary | ICD-10-CM

## 2013-08-13 DIAGNOSIS — M25569 Pain in unspecified knee: Secondary | ICD-10-CM

## 2013-08-13 NOTE — Progress Notes (Signed)
   Subjective:    Patient ID: [name] [name], female    DOB: May 20, 1960, 53 y.o.   MRN: [id number]  HPI She has a several year history of right knee pain and giving away especially with moving her knee. This has been getting worse over the last several months. She's had no popping or locking but does sometimes feel a grinding sensation. She has noted no swelling. She is concerned that this could be related to previous radiation for colon cancer.   Review of Systems     Objective:   Physical Exam Right knee exam shows no effusion or skin changes. Questionable joint line tenderness. Anterior drawer negative. McMurray's testing was positive bilaterally. No clicking was noted. Medial and lateral collateral ligaments intact.       Assessment & Plan:  Right knee pain  Since her McMurray's testing was positive and she has had worsening of her symptoms, I think an MRI is warranted. I also explained that I did not think this was related to her radiation

## 2013-08-17 ENCOUNTER — Telehealth: Payer: Self-pay | Admitting: Family Medicine

## 2013-08-17 NOTE — Telephone Encounter (Signed)
lm

## 2014-01-12 ENCOUNTER — Ambulatory Visit (INDEPENDENT_AMBULATORY_CARE_PROVIDER_SITE_OTHER): Payer: BC Managed Care – PPO | Admitting: Medical

## 2014-01-12 ENCOUNTER — Encounter: Payer: Self-pay | Admitting: Medical

## 2014-01-12 ENCOUNTER — Telehealth: Payer: Self-pay | Admitting: Medical

## 2014-01-12 VITALS — BP 152/90 | HR 78 | Temp 98.5°F | Resp 18 | Wt 194.0 lb

## 2014-01-12 DIAGNOSIS — R109 Unspecified abdominal pain: Secondary | ICD-10-CM

## 2014-01-12 DIAGNOSIS — Z9889 Other specified postprocedural states: Secondary | ICD-10-CM

## 2014-01-12 DIAGNOSIS — R100 Acute abdomen: Secondary | ICD-10-CM

## 2014-01-12 DIAGNOSIS — K439 Ventral hernia without obstruction or gangrene: Secondary | ICD-10-CM

## 2014-01-12 NOTE — Telephone Encounter (Signed)
I fax over her Ov notes to [facility] Fax # [phone] and they will contact the patient for her appointment

## 2014-01-12 NOTE — Telephone Encounter (Signed)
Refer to general surgery for possible hernia of abdominal wall.   Refer sooner than later.

## 2014-01-12 NOTE — Progress Notes (Signed)
Subjective: Here for abdominal pain and knot in abdomen.  Normally sees Dr. [name] here.   He has a history of multiple abdominal surgeries including surgery for colectomy for colon cancer, C-section.  She notes intermittent unusual sharp pains in her abdomen for the last year or so.  In the last few weeks has had a more significant focal pain to the side of her abdominal surgical scar and a lump.  One of her coworkers pressed on the area and was very painful.  She is here for further evaluation  She denies any recent GU or GI changes, pain is not worse with eating, no constipation, no diarrhea, no blood in urine or stool, no back pain, no fever, no weight loss, no vaginal complaint. No history of uterine fibroids. No other aggravating or relieving factors. No other complaint  Past Medical History  Diagnosis Date  . Cancer     rectal ca  . Colon cancer    Past Surgical History  Procedure Laterality Date  . Colon surgery    . Colonoscopy    . Polypectomy    . Colon cancer      Review of Systems Constitutional: -fever, +chills, -sweats, -unexpected weight change, +fatigue in the mornings ENT: -runny nose, -ear pain, -sore throat Cardiology:  -chest pain, -palpitations, -edema Respiratory: -cough, -shortness of breath, -wheezing Gastroenterology: +abdominal pain, -nausea, -vomiting, +recent diarrhea for 6 days while on cruise, but this resolved, -constipation  Hematology: -bleeding or bruising problems Musculoskeletal: -arthralgias, -myalgias, -joint swelling, +back pain, at times, intermittent Ophthalmology: -vision changes Urology: -dysuria, -difficulty urinating, +hematuria during cruise in September, but this resolved, +recent urinary frequency, +recent urgency Neurology: +headache occasional, -weakness, -tingling, -numbness   Objective: Filed Vitals:   01/12/14 0812  BP: 152/90  Pulse: 78  Temp: 98.5 F (36.9 C)  Resp: 18    General appearance: alert, no distress,  WD/WN Heart: RRR, normal S1, S2, no murmurs Lungs: CTA bilaterally, no wheezes, rhonchi, or rales Abdomen: +bs, soft, surgical scar of the lower vertical abdomen, there is a very tender raised 2-3 cm lump to the left of her surgical scar mid ways along the scar, somewhat reducible, but not fully reducible, but no ecchymosis, No induration, no fluctuance.  She has some mild tenderness in the right upper quadrant and left lower quadrant, nonspecific, no other mass no organomegaly non tender, non distended, no masses, no hepatomegaly, no splenomegaly Pulses: 2+ symmetric, upper and lower extremities, normal cap refill  Assessment: Encounter Diagnoses  Name Primary?  . Abdominal pain, acute Yes  . Ventral hernia without obstruction or gangrene   . H/O abdominal surgery    Plan: Discuss case with Dr. [name] supervising physician who also examined patient and we both in agreement that she likely has a hernia of her lower abdomen along the prior surgical scar.  Referral to general surgery. Discussed symptoms and signs of strangulated hernia that would prompt an emergent recheck.

## 2014-01-14 ENCOUNTER — Telehealth: Payer: Self-pay | Admitting: Family Medicine

## 2014-01-14 NOTE — Telephone Encounter (Signed)
[name] [name] I called [facility], they have not made patient an appointment yet. Her information has been faxed. They recommend an abd ultrasound to evaluate if it has not been done already. She says she will call when they have made her an appointment.

## 2014-01-14 NOTE — Telephone Encounter (Signed)
Pt called [facility] to fu on referral and when she can be seen for appt and they told her that they are working on pt's that were referred to them on 11/9 so it will be a while before they get to her. Pt is concerned because of the pain she is in. Does she need to go to hospital, can we get her seen sooner?

## 2014-01-14 NOTE — Telephone Encounter (Signed)
pls check on this. Not sure, but maybe check with [place] on appt date/time.  Let me know in case we need to do something different

## 2014-01-14 NOTE — Telephone Encounter (Signed)
Lets go ahead and get abdominal ultrasound then.

## 2014-01-15 ENCOUNTER — Other Ambulatory Visit: Payer: Self-pay | Admitting: Family Medicine

## 2014-01-15 DIAGNOSIS — R109 Unspecified abdominal pain: Secondary | ICD-10-CM

## 2014-01-15 NOTE — Telephone Encounter (Signed)
Patient is aware of her appointment for abdominal ultrasound 01/22/14 @ 940am. CLS

## 2014-01-20 ENCOUNTER — Other Ambulatory Visit: Payer: Self-pay | Admitting: Family Medicine

## 2014-01-20 DIAGNOSIS — K439 Ventral hernia without obstruction or gangrene: Secondary | ICD-10-CM

## 2014-01-22 ENCOUNTER — Other Ambulatory Visit: Payer: BC Managed Care – PPO

## 2014-01-27 ENCOUNTER — Ambulatory Visit
Admission: RE | Admit: 2014-01-27 | Discharge: 2014-01-27 | Disposition: A | Discharge status: Home / Self Care | Payer: BC Managed Care – PPO | Source: Ambulatory Visit | Attending: Medical | Admitting: Medical

## 2014-01-27 DIAGNOSIS — R109 Unspecified abdominal pain: Secondary | ICD-10-CM

## 2014-01-27 IMAGING — US US ABDOMEN COMPLETE
1 series · 13 of 25 positions shown · non-contrast
Comparison: CT chest abdomen pelvis of [DATE]

ADDENDUM:
The referring physician's office called and wanted a re-evaluation
of the ultrasound of the abdomen to assess for possible abdominal
wall hernia or mass. On the ultrasound of the abdomen performed on
[DATE] no mass or hernia is identified. An abdominal wall hernia
may be difficult to visualize by ultrasound and if further
assessment is warranted CT through the region could be performed for
more sensitive assessment.
CLINICAL DATA: Knot felt over the abdomen, upper abdominal pain
history of colon carcinoma colon resection

EXAM:
ULTRASOUND ABDOMEN COMPLETE

[Series 1: us abdomen complete · 0.22mm/px · 13 of 76 slices shown]
[im 1/76]
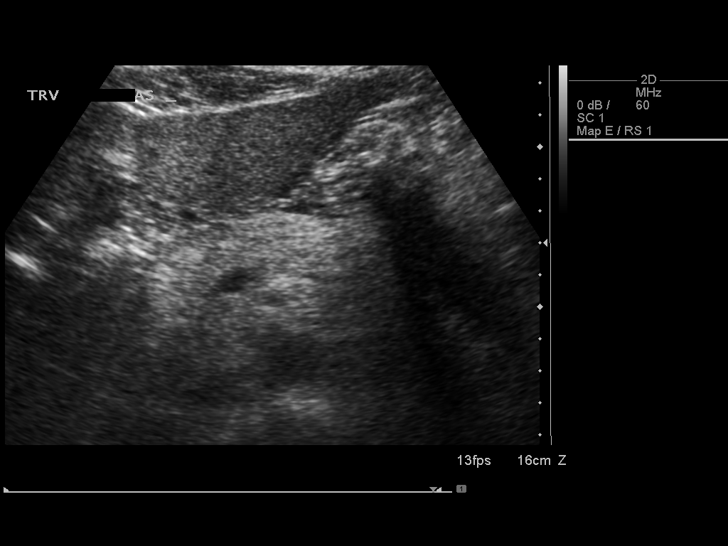
[im 7/76]
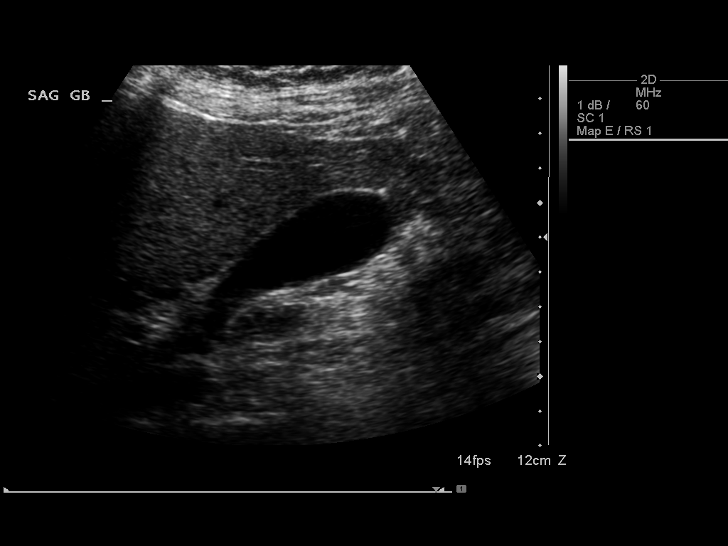
[im 13/76]
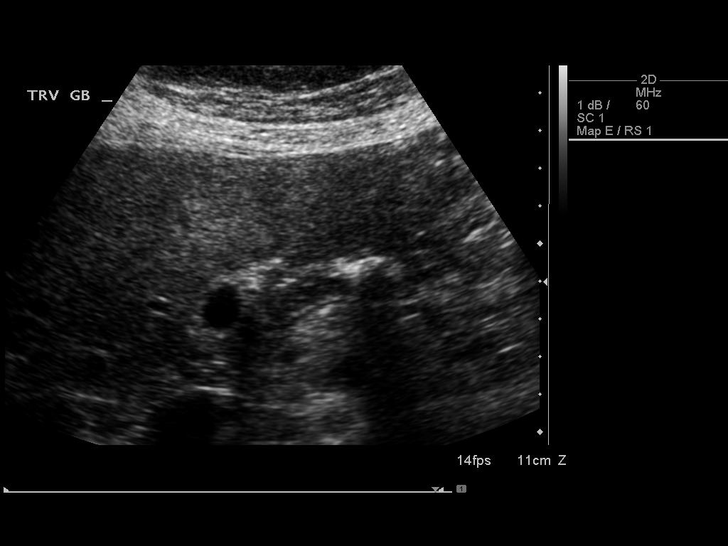
[im 19/76]
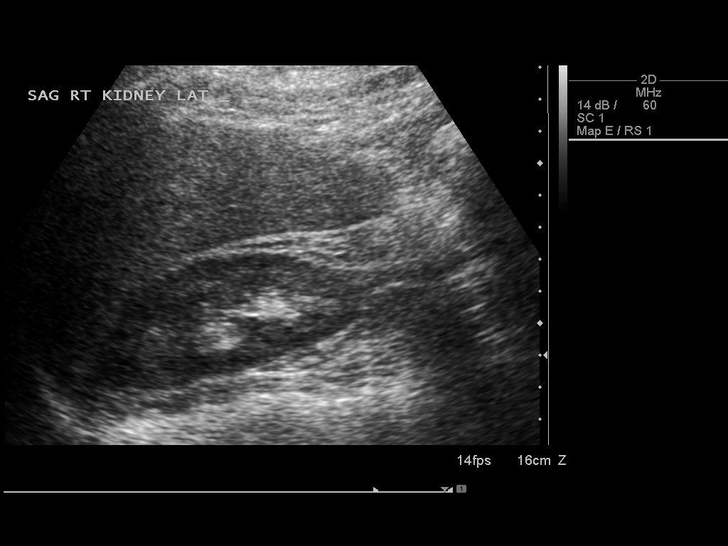
[im 26/76]
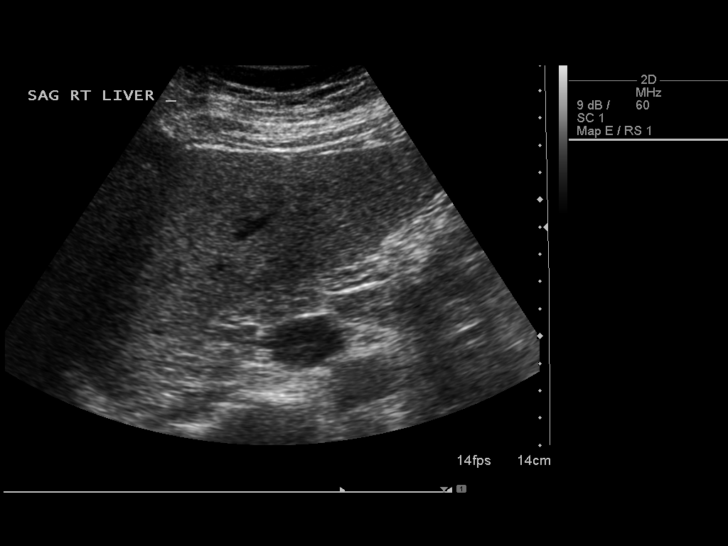
[im 32/76]
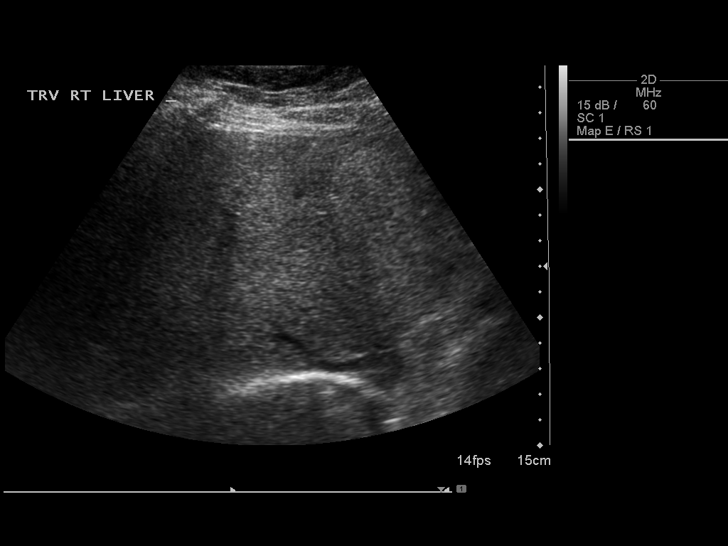
[im 38/76]
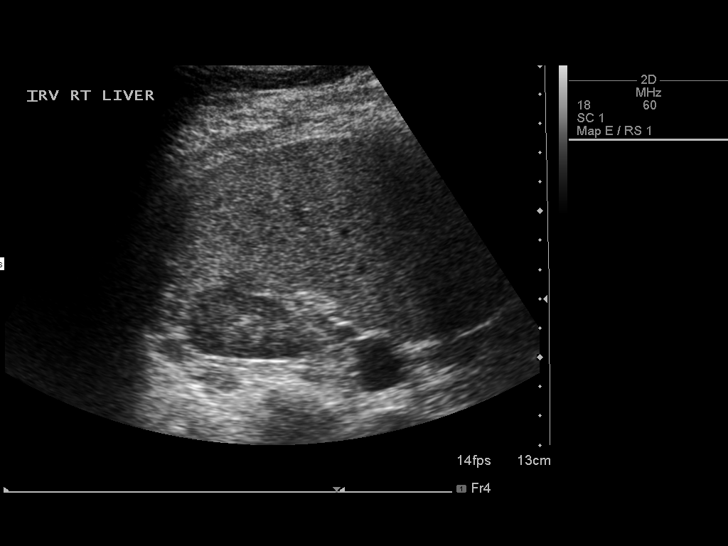
[im 44/76]
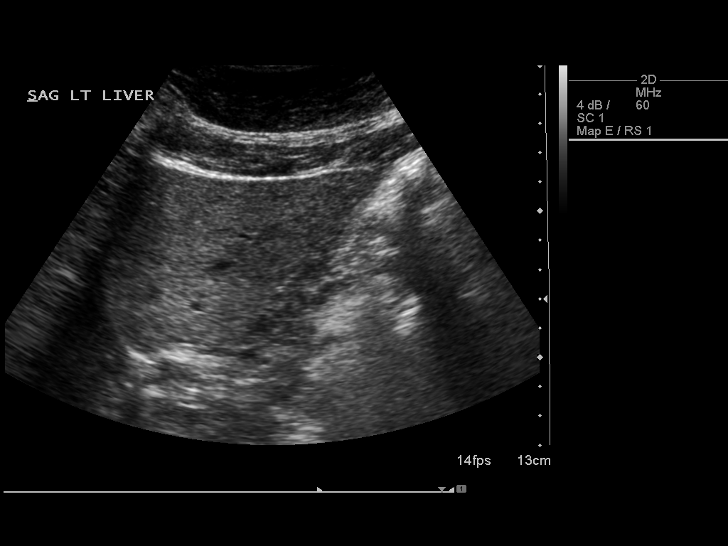
[im 51/76]
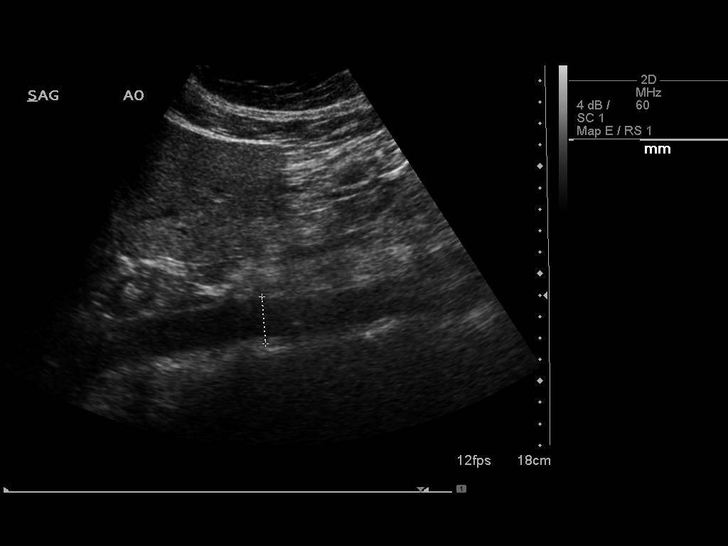
[im 57/76]
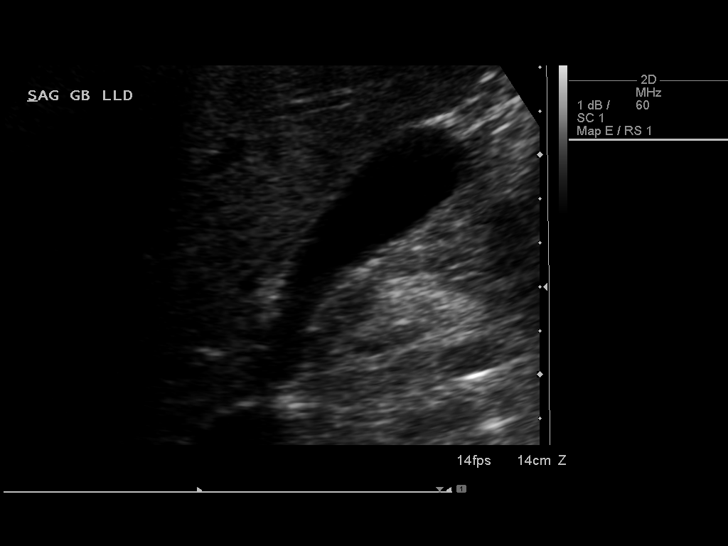
[im 63/76]
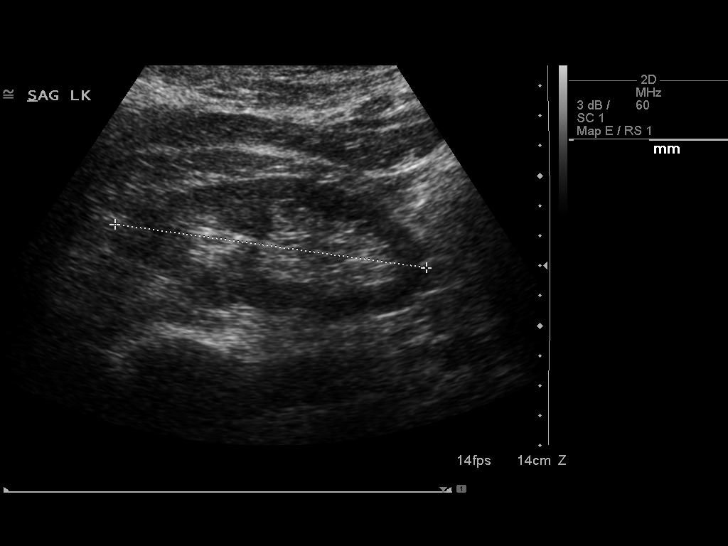
[im 69/76]
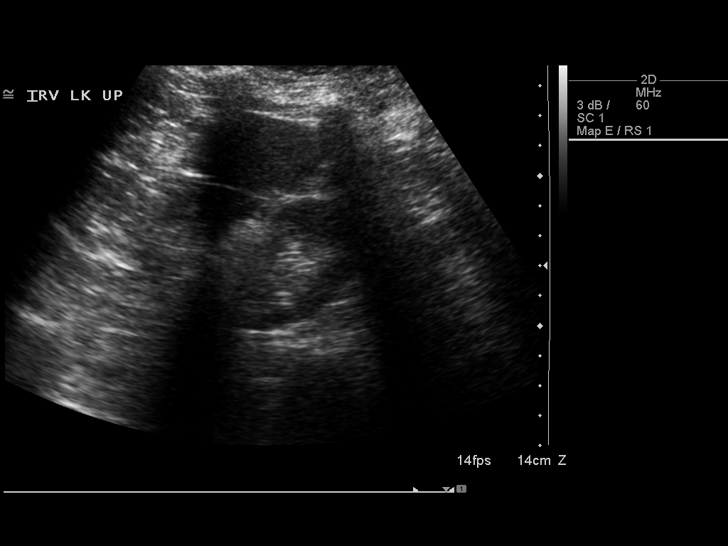
[im 76/76]
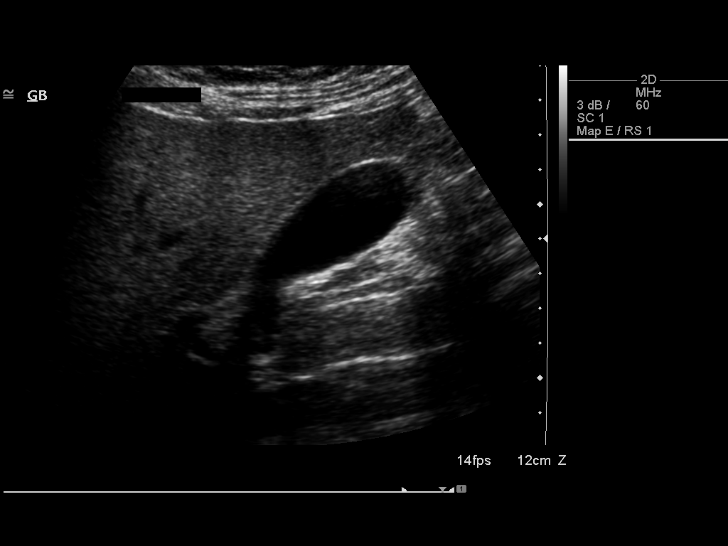

[13 of 25 positions shown; findings below may reference images not displayed]

FINDINGS: Gallbladder: The gallbladder is visualized and no gallstones are
noted. There is no pain over the gallbladder with compression.

Common bile duct: Diameter: The common bile duct is normal measuring
2.8 mm in diameter.

Liver: The liver is somewhat inhomogeneous which may indicate mild
fatty infiltration. No focal hepatic abnormality is seen.

IVC: No abnormality visualized.

Pancreas: Portions of the pancreas are obscured by bowel gas with
only the mid body visualized.

Spleen: The spleen is normal measuring 4.4 cm.

Right Kidney: Length: 9.7 cm..  No hydronephrosis is seen.

Left Kidney: Length: 11.0 cm..  No hydronephrosis is noted.

Abdominal aorta: The abdominal aorta is normal in caliber.

Other findings: None.
IMPRESSION: 1. No gallstones.
2. Somewhat inhomogeneous liver.  Possible mild fatty infiltration.
3. Portions of the pancreas are obscured by bowel gas.

## 2014-02-04 ENCOUNTER — Other Ambulatory Visit (INDEPENDENT_AMBULATORY_CARE_PROVIDER_SITE_OTHER): Payer: Self-pay | Admitting: General Surgery

## 2014-02-04 DIAGNOSIS — R1032 Left lower quadrant pain: Secondary | ICD-10-CM

## 2014-02-08 ENCOUNTER — Ambulatory Visit
Admission: RE | Admit: 2014-02-08 | Discharge: 2014-02-08 | Disposition: A | Discharge status: Home / Self Care | Payer: BC Managed Care – PPO | Source: Ambulatory Visit | Attending: General Surgery | Admitting: General Surgery

## 2014-02-08 DIAGNOSIS — R1032 Left lower quadrant pain: Secondary | ICD-10-CM

## 2014-02-08 IMAGING — CT CT ABD-PELV W/ CM
3 of 5 series · 12 of 36 positions shown, 18 images · IV contrast (READICAT/WATER & [ID] OMNI 300)
Comparison: [DATE]

CLINICAL DATA: Left lower quadrant pain and bulge, history of colon
cancer [N1] with surgery chemo and radiation therapy

EXAM:
CT ABDOMEN AND PELVIS WITH CONTRAST
TECHNIQUE: Multidetector CT imaging of the abdomen and pelvis was performed
using the standard protocol following bolus administration of
intravenous contrast.
CONTRAST:  100mL OMNIPAQUE IOHEXOL 300 MG/ML  SOLN

[Series 3: abd/pelvis with · axial · 0.78mm/px · z∈[-298,-22]mm · 6 of 79 slices shown, 11 images]
[im 12/79  soft-tissue]
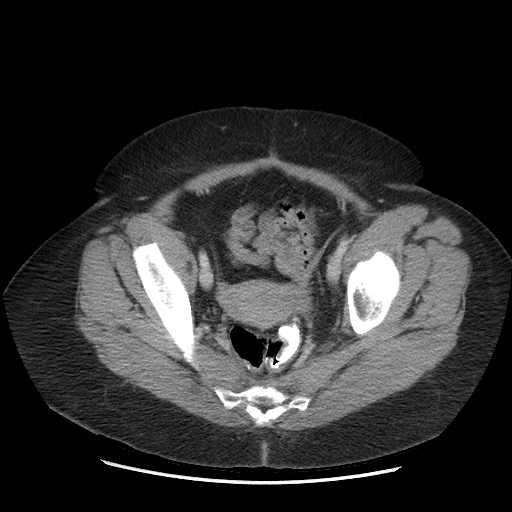
[im 12/79  bone]
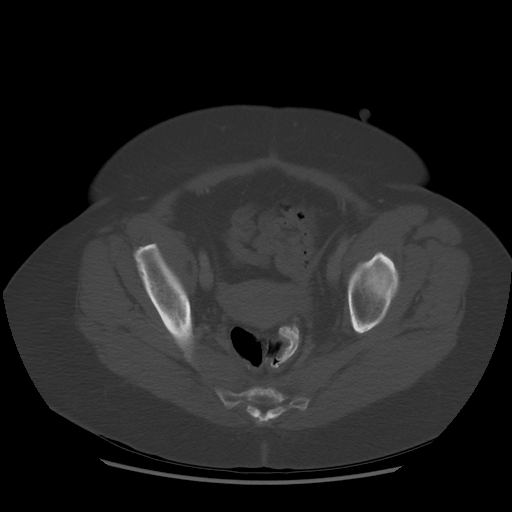
[im 23/79  soft-tissue]
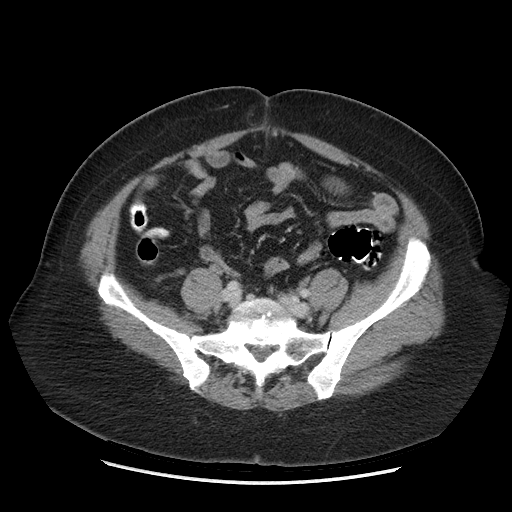
[im 34/79  soft-tissue]
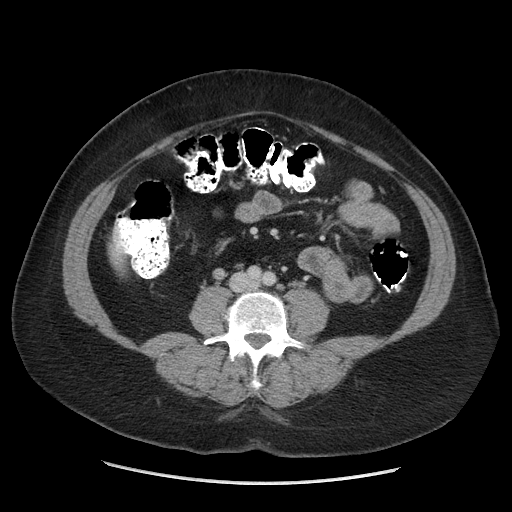
[im 34/79  lung]
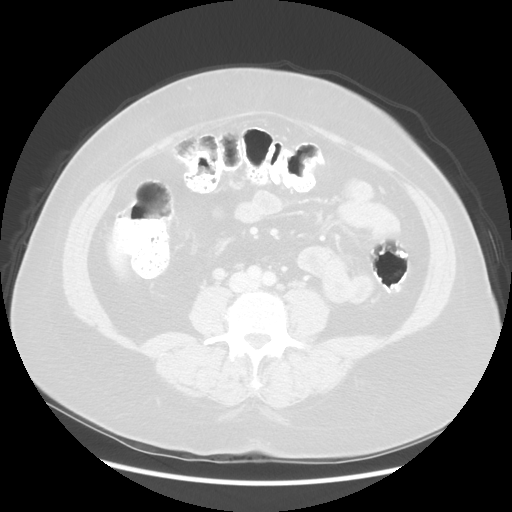
[im 45/79  soft-tissue]
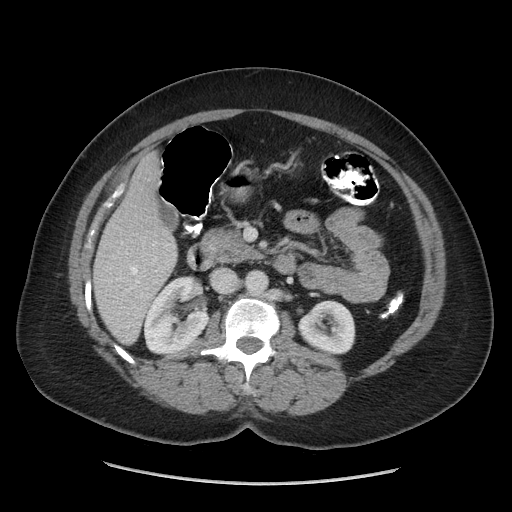
[im 45/79  lung]
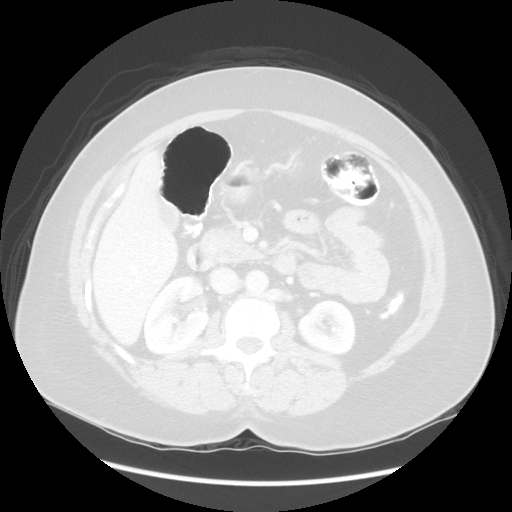
[im 56/79  soft-tissue]
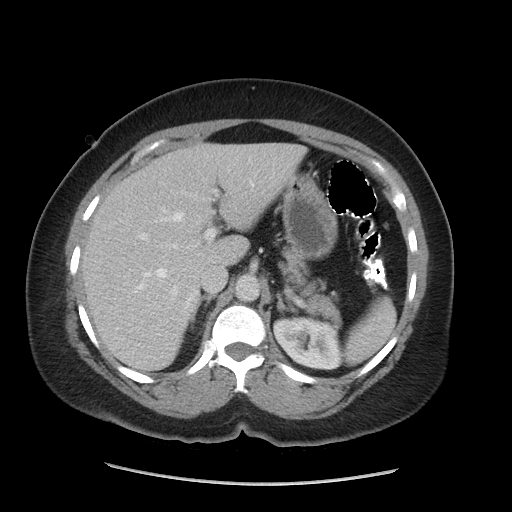
[im 56/79  lung]
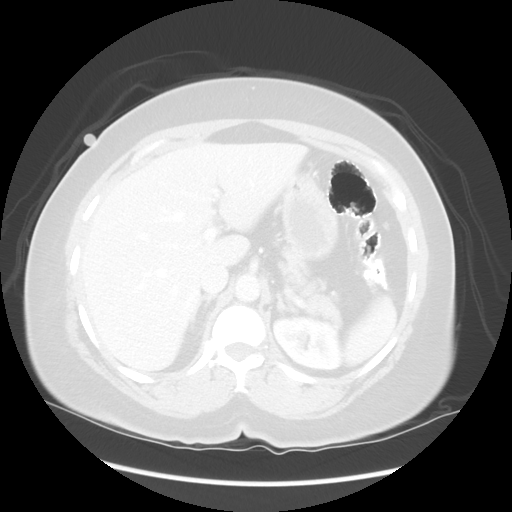
[im 67/79  soft-tissue]
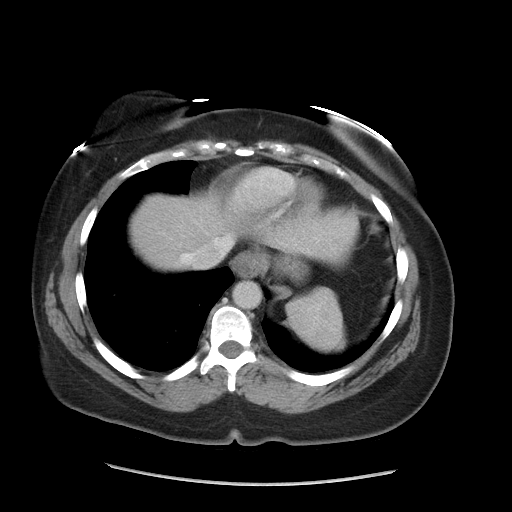
[im 67/79  lung]
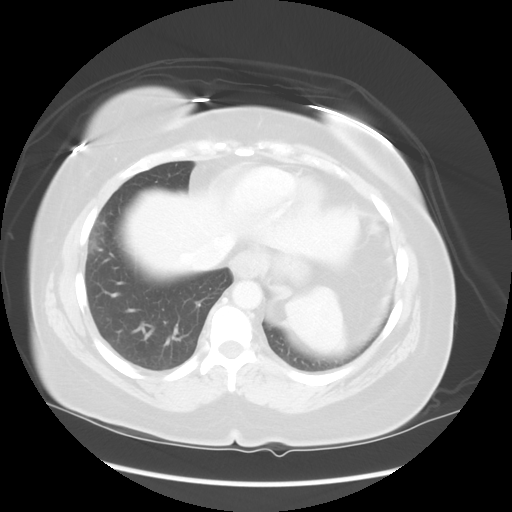

[Series 601: coronal body · coronal · 0.91mm/px · 1 of 130 slices shown, 2 images]
[im 44/130  soft-tissue]
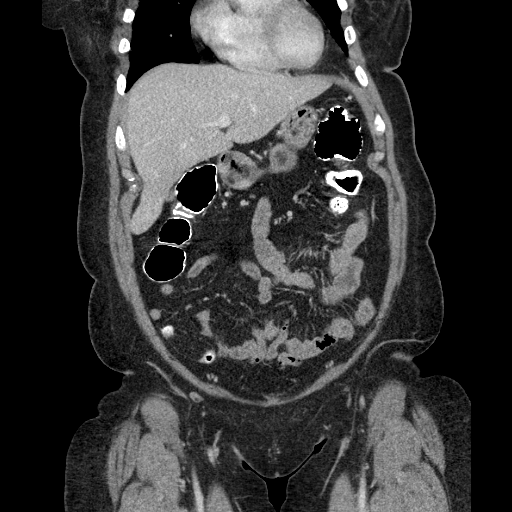
[im 44/130  bone]
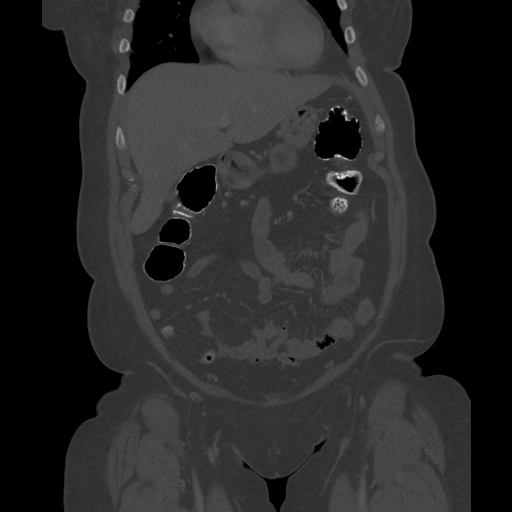

[Series 602: sagittal body · sagittal · 0.91mm/px · 5 of 161 slices shown]
[im 19/161  soft-tissue]
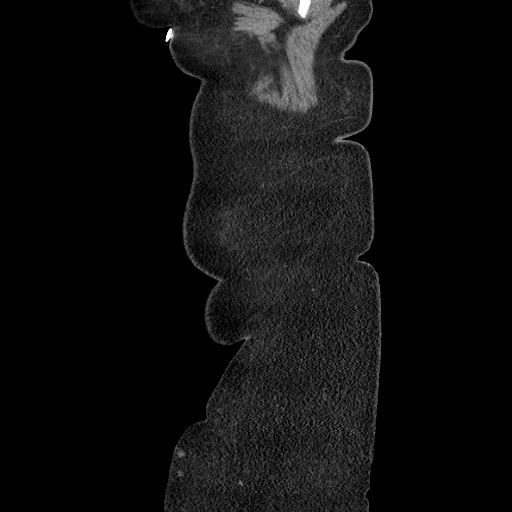
[im 38/161  soft-tissue]
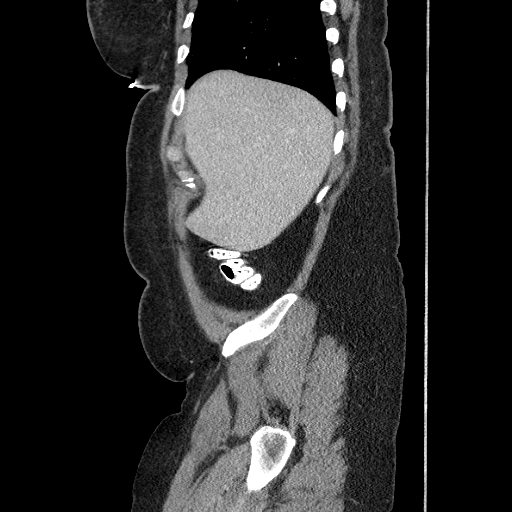
[im 57/161  soft-tissue]
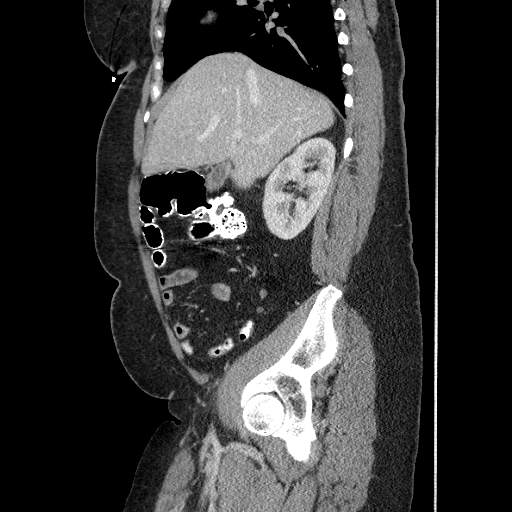
[im 76/161  soft-tissue]
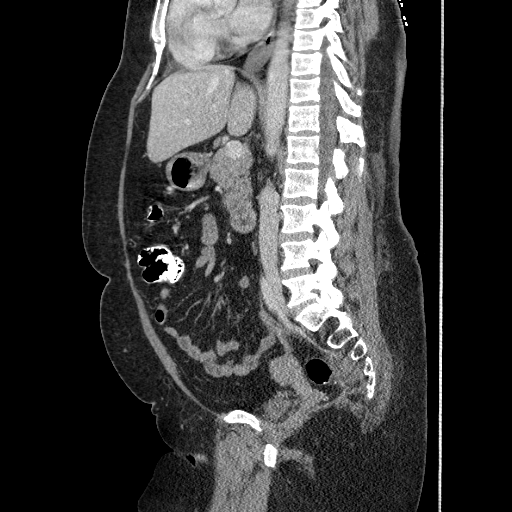
[im 95/161  soft-tissue]
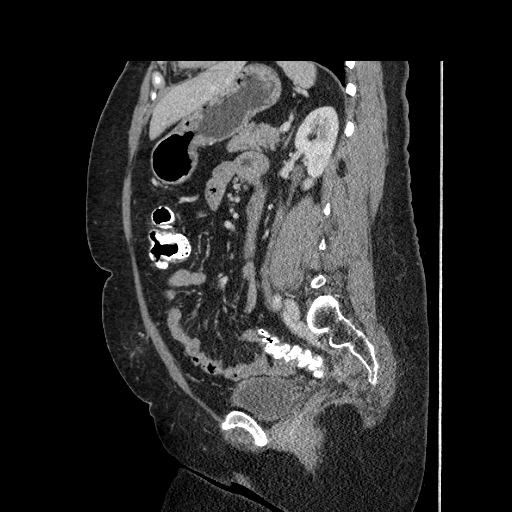

[12 of 36 positions shown; findings below may reference images not displayed]

FINDINGS: Sagittal images of the spine shows minimal degenerative changes
lumbar spine. No destructive bony lesions are noted. Small hiatal
hernia again noted.

Lung bases are unremarkable.

There are postsurgical changes midline lower anterior abdominal
wall. Axial image 62 there is a tiny midline infraumbilical ventral
hernia containing fat measures about 1.2 cm. There is a second left
paramedian infraumbilical ventral hernia containing omental fat
axial image 63 measures about 2.3 cm. There is no evidence of acute
complication. This may be palpable as there is a skin marker at this
level however the marker is more laterally.

Liver, spleen, pancreas and adrenal glands are unremarkable. Kidneys
are symmetrical in size and enhancement. No hydronephrosis or
hydroureter.

Delayed renal images shows bilateral renal symmetrical excretion.
Bilateral visualized proximal ureter is unremarkable.

No small bowel obstruction. No ascites or free air. No adenopathy.
There is no pericecal inflammation. Normal appendix. The terminal
ileum is unremarkable. Uterus and adnexa are unremarkable. Mild
thickening of urinary bladder wall. This may be due to chronic
inflammation or postradiation. Stable low rectal anastomosis.
Postoperative changes presacral region are without change from prior
exam.

No definite recurrent mass. There is a third small right para medial
ventral hernia infraumbilical region best seen in sagittal image 81
measures 1.7 cm containing omental fat without evidence of acute
complication.
IMPRESSION: 1. No evidence of metastatic disease or recurrent tumor. Stable low
rectal anastomosis and postoperative changes presacral region.
2. There is a tiny midline infraumbilical region ventral hernia
containing omental fat. Second left paramedian ventral hernia
containing omental fat left infraumbilical region measures 2.3 cm
without evidence of acute complication. There is a third right para
median infraumbilical ventral hernia containing fat sagittal image
81 measures 1.7 cm. No evidence of acute complication.
3. No small bowel obstruction. Normal appendix. No pericecal
inflammation.
4. Nonspecific mild thickening of urinary bladder wall. This may be
due to chronic mild inflammation or postradiation.
5. No adenopathy.  No hydronephrosis or hydroureter.

## 2014-02-08 MED ORDER — IOHEXOL 300 MG/ML  SOLN
100.0000 mL | Freq: Once | INTRAMUSCULAR | Status: AC | PRN
  Administered 2014-02-08: 100 mL via INTRAVENOUS

## 2014-02-22 ENCOUNTER — Encounter (INDEPENDENT_AMBULATORY_CARE_PROVIDER_SITE_OTHER): Payer: Self-pay | Admitting: General Surgery

## 2014-02-22 NOTE — Progress Notes (Signed)
Patient ID: [name] [name], female   DOB: August 28, 1960, 53 y.o.   MRN: [id number]  [name] [name] 02/22/2014 4:20 PM Location: [facility] Patient #: [id number] DOB: 12/11/1960 Single / Language: Cleophus Molt / Race: Black or African American Female History of Present Illness [name] Hollingshead MD; 02/22/2014 5:28 PM) Patient words: hernia.  The patient is a 53 year old female    Note:She returns today to discuss the results of her CT scan and discuss surgery. CT scan demonstrates 3 infraumbilical incisional hernias none of which contain intestine. There is no significant abnormality in the right upper quadrant to explain her right chest wall pain. No evidence of metastatic disease. She still having some symptoms from the left sided incisional hernia and she is interested in repair.  Allergies (Sonya Bynum, CMA; 02/22/2014 4:20 PM) Epinephrine & Chlorpheniramine *VASOPRESSORS* Lidocaine (5%)/Dextrose 7.5% *LOCAL ANESTHETICS-Parenteral*  Medication History Davy Pique Bynum, CMA; 02/22/2014 4:20 PM) No Current Medications    Vitals (Sonya Bynum CMA; 02/22/2014 4:20 PM) 02/22/2014 4:20 PM Weight: 195 lb Height: 60in Body Surface Area: 1.94 m Body Mass Index: 38.08 kg/m Temp.: 3F(Temporal)  Pulse: 79 (Regular)  BP: 130/80 (Sitting, Left Arm, Standard)     Physical Exam [name] Hollingshead MD; 02/22/2014 5:29 PM)  The physical exam findings are as follows: Note:General-Overweight female in NAD  Chest-Skin discoloration in right inferior costal area with tenderness to light touch  Abd-obese, small defect to left of midline    Assessment & Plan [name] Hollingshead MD; 02/22/2014 5:31 PM)  [name] [name] HERNIA, WITHOUT OBSTRUCTION OR GANGRENE ([phone]  K43.2) Impression: She is interested in repair. I recommended laparoscopic repair with mesh and she would like to proceed. No obvious etiology for right lower chest wall pain.  Plan: Schedule for  elective laparoscopic incisional hernia repair with mesh. I have discussed the procedure, risks, and aftercare. Risks include but are not limited to bleeding, infection, wound healing problems, anesthesia, recurrence, accidental injury to intra-abdominal organs-such as intestine, liver, spleen, bladder, etc. We also discussed the rare complication of mesh rejection. All questions were answered.  Current Plans Schedule for Surgery  Jackolyn Confer, MD

## 2014-03-24 NOTE — Patient Instructions (Addendum)
[name] [name]  03/24/2014   Your procedure is scheduled on: 04/02/2014    Report to [facility] Main  Entrance and follow signs to               [place] at        1000 AM.  Call this number if you have problems the morning of surgery [phone]   Remember:  Do not eat food or drink liquids :After Midnight.     Take these medicines the morning of surgery with A SIP OF WATER: none                                You may not have any metal on your body including hair pins and              piercings  Do not wear jewelry, make-up, lotions, powders or perfumes., deodorant.              Do not wear nail polish.  Do not shave  48 hours prior to surgery.     Do not bring valuables to the hospital. [place].  Contacts, dentures or bridgework may not be worn into surgery.  Leave suitcase in the car. After surgery it may be brought to your room.       Special Instructions; coughing and deep breathing exercises, leg exercises               Please read over the following fact sheets you were given: _____________________________________________________________________             [facility] - Preparing for Surgery Before surgery, you can play an important role.  Because skin is not sterile, your skin needs to be as free of germs as possible.  You can reduce the number of germs on your skin by washing with CHG (chlorahexidine gluconate) soap before surgery.  CHG is an antiseptic cleaner which kills germs and bonds with the skin to continue killing germs even after washing. Please DO NOT use if you have an allergy to CHG or antibacterial soaps.  If your skin becomes reddened/irritated stop using the CHG and inform your nurse when you arrive at Short Stay. Do not shave (including legs and underarms) for at least 48 hours prior to the first CHG shower.  You may shave your face/neck. Please follow these  instructions carefully:  1.  Shower with CHG Soap the night before surgery and the  morning of Surgery.  2.  If you choose to wash your hair, wash your hair first as usual with your  normal  shampoo.  3.  After you shampoo, rinse your hair and body thoroughly to remove the  shampoo.                           4.  Use CHG as you would any other liquid soap.  You can apply chg directly  to the skin and wash                       Gently with a scrungie or clean washcloth.  5.  Apply the CHG Soap to your body ONLY FROM THE NECK  DOWN.   Do not use on face/ open                           Wound or open sores. Avoid contact with eyes, ears mouth and genitals (private parts).                       Wash face,  Genitals (private parts) with your normal soap.             6.  Wash thoroughly, paying special attention to the area where your surgery  will be performed.  7.  Thoroughly rinse your body with warm water from the neck down.  8.  DO NOT shower/wash with your normal soap after using and rinsing off  the CHG Soap.                9.  Pat yourself dry with a clean towel.            10.  Wear clean pajamas.            11.  Place clean sheets on your bed the night of your first shower and do not  sleep with pets. Day of Surgery : Do not apply any lotions/deodorants the morning of surgery.  Please wear clean clothes to the hospital/surgery center.  FAILURE TO FOLLOW THESE INSTRUCTIONS MAY RESULT IN THE CANCELLATION OF YOUR SURGERY PATIENT SIGNATURE_________________________________  NURSE SIGNATURE__________________________________  ________________________________________________________________________  WHAT IS A BLOOD TRANSFUSION? Blood Transfusion Information  A transfusion is the replacement of blood or some of its parts. Blood is made up of multiple cells which provide different functions.  Red blood cells carry oxygen and are used for blood loss replacement.  White blood cells fight against  infection.  Platelets control bleeding.  Plasma helps clot blood.  Other blood products are available for specialized needs, such as hemophilia or other clotting disorders. BEFORE THE TRANSFUSION  Who gives blood for transfusions?   Healthy volunteers who are fully evaluated to make sure their blood is safe. This is blood bank blood. Transfusion therapy is the safest it has ever been in the practice of medicine. Before blood is taken from a donor, a complete history is taken to make sure that person has no history of diseases nor engages in risky social behavior (examples are intravenous drug use or sexual activity with multiple partners). The donor's travel history is screened to minimize risk of transmitting infections, such as malaria. The donated blood is tested for signs of infectious diseases, such as HIV and hepatitis. The blood is then tested to be sure it is compatible with you in order to minimize the chance of a transfusion reaction. If you or a relative donates blood, this is often done in anticipation of surgery and is not appropriate for emergency situations. It takes many days to process the donated blood. RISKS AND COMPLICATIONS Although transfusion therapy is very safe and saves many lives, the main dangers of transfusion include:   Getting an infectious disease.  Developing a transfusion reaction. This is an allergic reaction to something in the blood you were given. Every precaution is taken to prevent this. The decision to have a blood transfusion has been considered carefully by your caregiver before blood is given. Blood is not given unless the benefits outweigh the risks. AFTER THE TRANSFUSION  Right after receiving a blood transfusion, you will usually feel much better and more energetic.  This is especially true if your red blood cells have gotten low (anemic). The transfusion raises the level of the red blood cells which carry oxygen, and this usually causes an energy  increase.  The nurse administering the transfusion will monitor you carefully for complications. HOME CARE INSTRUCTIONS  No special instructions are needed after a transfusion. You may find your energy is better. Speak with your caregiver about any limitations on activity for underlying diseases you may have. SEEK MEDICAL CARE IF:   Your condition is not improving after your transfusion.  You develop redness or irritation at the intravenous (IV) site. SEEK IMMEDIATE MEDICAL CARE IF:  Any of the following symptoms occur over the next 12 hours:  Shaking chills.  You have a temperature by mouth above 102 F (38.9 C), not controlled by medicine.  Chest, back, or muscle pain.  People around you feel you are not acting correctly or are confused.  Shortness of breath or difficulty breathing.  Dizziness and fainting.  You get a rash or develop hives.  You have a decrease in urine output.  Your urine turns a dark color or changes to pink, red, or brown. Any of the following symptoms occur over the next 10 days:  You have a temperature by mouth above 102 F (38.9 C), not controlled by medicine.  Shortness of breath.  Weakness after normal activity.  The white part of the eye turns yellow (jaundice).  You have a decrease in the amount of urine or are urinating less often.  Your urine turns a dark color or changes to pink, red, or brown. Document Released: 02/17/2000 Document Revised: 05/14/2011 Document Reviewed: 10/06/2007 [facility] Main Patient Information 2014 [place], [place].  _______________________________________________________________________

## 2014-03-25 ENCOUNTER — Encounter (HOSPITAL_COMMUNITY)
Admission: RE | Admit: 2014-03-25 | Discharge: 2014-03-25 | Disposition: A | Discharge status: Home / Self Care | Payer: BLUE CROSS/BLUE SHIELD | Source: Ambulatory Visit | Attending: General Surgery | Admitting: General Surgery

## 2014-03-25 ENCOUNTER — Encounter (HOSPITAL_COMMUNITY): Payer: Self-pay

## 2014-03-25 DIAGNOSIS — K432 Incisional hernia without obstruction or gangrene: Secondary | ICD-10-CM | POA: Diagnosis not present

## 2014-03-25 DIAGNOSIS — Z01818 Encounter for other preprocedural examination: Secondary | ICD-10-CM | POA: Diagnosis present

## 2014-03-25 HISTORY — DX: Headache: R51

## 2014-03-25 HISTORY — DX: Headache, unspecified: R51.9

## 2014-03-25 HISTORY — DX: Anemia, unspecified: D64.9

## 2014-03-25 LAB — COMPREHENSIVE METABOLIC PANEL
ALBUMIN: 3.9 g/dL (ref 3.5–5.2)
ALT: 14 U/L (ref 0–35)
AST: 26 U/L (ref 0–37)
Alkaline Phosphatase: 72 U/L (ref 39–117)
Anion gap: 7 (ref 5–15)
BUN: 12 mg/dL (ref 6–23)
CALCIUM: 8.7 mg/dL (ref 8.4–10.5)
CO2: 27 mmol/L (ref 19–32)
Chloride: 104 mEq/L (ref 96–112)
Creatinine, Ser: 0.97 mg/dL (ref 0.50–1.10)
GFR calc Af Amer: 75 mL/min — ABNORMAL LOW (ref >90)
GFR, EST NON AFRICAN AMERICAN: 65 mL/min — AB (ref >90)
GLUCOSE: 153 mg/dL — AB (ref 70–99)
Potassium: 4.4 mmol/L (ref 3.5–5.1)
Sodium: 138 mmol/L (ref 135–145)
Total Bilirubin: 1 mg/dL (ref 0.3–1.2)
Total Protein: 6.9 g/dL (ref 6.0–8.3)

## 2014-03-25 LAB — CBC WITH DIFFERENTIAL/PLATELET
BASOS ABS: 0 10*3/uL (ref 0.0–0.1)
Basophils Relative: 0 % (ref 0–1)
EOS ABS: 0.1 10*3/uL (ref 0.0–0.7)
EOS PCT: 2 % (ref 0–5)
HEMATOCRIT: 39 % (ref 36.0–46.0)
Hemoglobin: 12 g/dL (ref 12.0–15.0)
Lymphocytes Relative: 44 % (ref 12–46)
Lymphs Abs: 1.7 10*3/uL (ref 0.7–4.0)
MCH: 22.3 pg — AB (ref 26.0–34.0)
MCHC: 30.8 g/dL (ref 30.0–36.0)
MCV: 72.5 fL — AB (ref 78.0–100.0)
Monocytes Absolute: 0.4 10*3/uL (ref 0.1–1.0)
Monocytes Relative: 9 % (ref 3–12)
Neutro Abs: 1.8 10*3/uL (ref 1.7–7.7)
Neutrophils Relative %: 45 % (ref 43–77)
Platelets: 273 10*3/uL (ref 150–400)
RBC: 5.38 MIL/uL — AB (ref 3.87–5.11)
RDW: 15.1 % (ref 11.5–15.5)
WBC: 3.9 10*3/uL — AB (ref 4.0–10.5)

## 2014-03-25 LAB — PROTIME-INR
INR: 0.98 (ref 0.00–1.49)
Prothrombin Time: 13.1 seconds (ref 11.6–15.2)

## 2014-03-25 NOTE — Progress Notes (Signed)
LOV 01/12/2014 with PCP in EPIC.

## 2014-03-25 NOTE — Progress Notes (Signed)
Patient reported at time of preop appointment allergy to Lidocaine and Epinephrine. Patient reported that it happened greater than 10 years ago at private MD office where she was going to have a procedure on nose.  Patient stated they told her she had reaction to Lidocaine and Epinephrine per patient  And she stated she ended up in ICU.  Patient could not remember name of physician or where this happened.  Looked thru EPIC and found where info had been requested regarding this from GI MD in 2012 but no information found where information was received to confirm the allergies.

## 2014-03-26 ENCOUNTER — Other Ambulatory Visit (HOSPITAL_COMMUNITY): Payer: Self-pay

## 2014-04-02 ENCOUNTER — Ambulatory Visit (HOSPITAL_COMMUNITY): Payer: BLUE CROSS/BLUE SHIELD | Admitting: *Deleted

## 2014-04-02 ENCOUNTER — Inpatient Hospital Stay (HOSPITAL_COMMUNITY)
Admission: RE | Admit: 2014-04-02 | Discharge: 2014-04-08 | DRG: 336 | Disposition: A | Discharge status: Home / Self Care | Payer: BLUE CROSS/BLUE SHIELD | Source: Ambulatory Visit | Attending: General Surgery | Admitting: General Surgery

## 2014-04-02 ENCOUNTER — Encounter (HOSPITAL_COMMUNITY): Payer: Self-pay | Admitting: *Deleted

## 2014-04-02 ENCOUNTER — Encounter (HOSPITAL_COMMUNITY)
Admission: RE | Disposition: A | Discharge status: Home / Self Care | Payer: Self-pay | Source: Ambulatory Visit | Attending: General Surgery

## 2014-04-02 DIAGNOSIS — R51 Headache: Secondary | ICD-10-CM | POA: Diagnosis not present

## 2014-04-02 DIAGNOSIS — Z6838 Body mass index (BMI) 38.0-38.9, adult: Secondary | ICD-10-CM

## 2014-04-02 DIAGNOSIS — K567 Ileus, unspecified: Secondary | ICD-10-CM | POA: Diagnosis not present

## 2014-04-02 DIAGNOSIS — K660 Peritoneal adhesions (postprocedural) (postinfection): Secondary | ICD-10-CM | POA: Diagnosis present

## 2014-04-02 DIAGNOSIS — R339 Retention of urine, unspecified: Secondary | ICD-10-CM | POA: Diagnosis not present

## 2014-04-02 DIAGNOSIS — K432 Incisional hernia without obstruction or gangrene: Principal | ICD-10-CM | POA: Diagnosis present

## 2014-04-02 DIAGNOSIS — Z85048 Personal history of other malignant neoplasm of rectum, rectosigmoid junction, and anus: Secondary | ICD-10-CM

## 2014-04-02 DIAGNOSIS — Z800 Family history of malignant neoplasm of digestive organs: Secondary | ICD-10-CM

## 2014-04-02 DIAGNOSIS — Z01812 Encounter for preprocedural laboratory examination: Secondary | ICD-10-CM

## 2014-04-02 DIAGNOSIS — Z87891 Personal history of nicotine dependence: Secondary | ICD-10-CM

## 2014-04-02 HISTORY — PX: VENTRAL HERNIA REPAIR: SHX424

## 2014-04-02 HISTORY — PX: LAPAROSCOPIC LYSIS OF ADHESIONS: SHX5905

## 2014-04-02 SURGERY — REPAIR, HERNIA, VENTRAL, LAPAROSCOPIC
Anesthesia: General | Site: Abdomen

## 2014-04-02 MED ORDER — PHENYLEPHRINE HCL 10 MG/ML IJ SOLN
INTRAMUSCULAR | Status: DC | PRN
  Administered 2014-04-02: 40 ug via INTRAVENOUS
  Administered 2014-04-02: 120 ug via INTRAVENOUS
  Administered 2014-04-02: 80 ug via INTRAVENOUS
  Administered 2014-04-02: 40 ug via INTRAVENOUS

## 2014-04-02 MED ORDER — PHENYLEPHRINE 40 MCG/ML (10ML) SYRINGE FOR IV PUSH (FOR BLOOD PRESSURE SUPPORT)
PREFILLED_SYRINGE | INTRAVENOUS | Status: AC
  Filled 2014-04-02: qty 10

## 2014-04-02 MED ORDER — METOCLOPRAMIDE HCL 5 MG/ML IJ SOLN
INTRAMUSCULAR | Status: AC
  Filled 2014-04-02: qty 2

## 2014-04-02 MED ORDER — ROCURONIUM BROMIDE 100 MG/10ML IV SOLN
INTRAVENOUS | Status: AC
  Filled 2014-04-02: qty 1

## 2014-04-02 MED ORDER — ONDANSETRON HCL 4 MG/2ML IJ SOLN
4.0000 mg | INTRAMUSCULAR | Status: DC | PRN
  Administered 2014-04-04: 4 mg via INTRAVENOUS
  Filled 2014-04-02: qty 2

## 2014-04-02 MED ORDER — CEFAZOLIN SODIUM-DEXTROSE 2-3 GM-% IV SOLR
INTRAVENOUS | Status: AC
  Filled 2014-04-02: qty 50

## 2014-04-02 MED ORDER — CEFAZOLIN SODIUM-DEXTROSE 2-3 GM-% IV SOLR
2.0000 g | INTRAVENOUS | Status: AC
  Administered 2014-04-02: 2 g via INTRAVENOUS

## 2014-04-02 MED ORDER — HYDROMORPHONE HCL 1 MG/ML IJ SOLN
INTRAMUSCULAR | Status: DC | PRN
  Administered 2014-04-02 (×2): 0.5 mg via INTRAVENOUS

## 2014-04-02 MED ORDER — DEXAMETHASONE SODIUM PHOSPHATE 4 MG/ML IJ SOLN
INTRAMUSCULAR | Status: DC | PRN
  Administered 2014-04-02: 10 mg via INTRAVENOUS

## 2014-04-02 MED ORDER — OXYCODONE HCL 5 MG PO TABS
5.0000 mg | ORAL_TABLET | ORAL | Status: DC | PRN
  Administered 2014-04-03 (×2): 10 mg via ORAL
  Filled 2014-04-02 (×2): qty 2

## 2014-04-02 MED ORDER — LACTATED RINGERS IV SOLN
INTRAVENOUS | Status: DC | PRN
  Administered 2014-04-02 (×3): via INTRAVENOUS

## 2014-04-02 MED ORDER — METOCLOPRAMIDE HCL 5 MG/ML IJ SOLN
INTRAMUSCULAR | Status: DC | PRN
  Administered 2014-04-02: 10 mg via INTRAVENOUS

## 2014-04-02 MED ORDER — MIDAZOLAM HCL 5 MG/5ML IJ SOLN
INTRAMUSCULAR | Status: DC | PRN
  Administered 2014-04-02: 2 mg via INTRAVENOUS

## 2014-04-02 MED ORDER — HYDROMORPHONE HCL 1 MG/ML IJ SOLN: 0.2500 mg | INTRAMUSCULAR | Status: DC | PRN

## 2014-04-02 MED ORDER — NEOSTIGMINE METHYLSULFATE 10 MG/10ML IV SOLN
INTRAVENOUS | Status: AC
  Filled 2014-04-02: qty 1

## 2014-04-02 MED ORDER — ONDANSETRON HCL 4 MG/2ML IJ SOLN
INTRAMUSCULAR | Status: DC | PRN
  Administered 2014-04-02: 4 mg via INTRAVENOUS

## 2014-04-02 MED ORDER — KCL-LACTATED RINGERS-D5W 20 MEQ/L IV SOLN
INTRAVENOUS | Status: DC
  Administered 2014-04-02: 18:00:00 via INTRAVENOUS
  Administered 2014-04-03: 75 mL/h via INTRAVENOUS
  Administered 2014-04-03 – 2014-04-05 (×3): via INTRAVENOUS
  Filled 2014-04-02 (×9): qty 1000

## 2014-04-02 MED ORDER — ROCURONIUM BROMIDE 100 MG/10ML IV SOLN
INTRAVENOUS | Status: DC | PRN
Start: 2014-04-02 — End: 2014-04-02
  Administered 2014-04-02: 40 mg via INTRAVENOUS
  Administered 2014-04-02: 10 mg via INTRAVENOUS

## 2014-04-02 MED ORDER — BUPIVACAINE HCL (PF) 0.5 % IJ SOLN
INTRAMUSCULAR | Status: AC
  Filled 2014-04-02: qty 30

## 2014-04-02 MED ORDER — DEXAMETHASONE SODIUM PHOSPHATE 10 MG/ML IJ SOLN
INTRAMUSCULAR | Status: AC
  Filled 2014-04-02: qty 1

## 2014-04-02 MED ORDER — ONDANSETRON HCL 4 MG/2ML IJ SOLN
INTRAMUSCULAR | Status: AC
  Filled 2014-04-02: qty 2

## 2014-04-02 MED ORDER — 0.9 % SODIUM CHLORIDE (POUR BTL) OPTIME
TOPICAL | Status: DC | PRN
  Administered 2014-04-02: 1000 mL

## 2014-04-02 MED ORDER — FENTANYL CITRATE 0.05 MG/ML IJ SOLN
INTRAMUSCULAR | Status: AC
  Filled 2014-04-02: qty 5

## 2014-04-02 MED ORDER — PROPOFOL 10 MG/ML IV BOLUS
INTRAVENOUS | Status: AC
  Filled 2014-04-02: qty 20

## 2014-04-02 MED ORDER — CEFAZOLIN SODIUM 1-5 GM-% IV SOLN
1.0000 g | Freq: Four times a day (QID) | INTRAVENOUS | Status: AC
  Administered 2014-04-02 – 2014-04-03 (×3): 1 g via INTRAVENOUS
  Filled 2014-04-02 (×3): qty 50

## 2014-04-02 MED ORDER — GLYCOPYRROLATE 0.2 MG/ML IJ SOLN
INTRAMUSCULAR | Status: AC
  Filled 2014-04-02: qty 3

## 2014-04-02 MED ORDER — PROPOFOL 10 MG/ML IV BOLUS
INTRAVENOUS | Status: DC | PRN
  Administered 2014-04-02: 50 mg via INTRAVENOUS
  Administered 2014-04-02: 150 mg via INTRAVENOUS

## 2014-04-02 MED ORDER — ACETAMINOPHEN 10 MG/ML IV SOLN
1000.0000 mg | Freq: Once | INTRAVENOUS | Status: AC
  Administered 2014-04-02: 1000 mg via INTRAVENOUS
  Filled 2014-04-02: qty 100

## 2014-04-02 MED ORDER — ONDANSETRON HCL 4 MG PO TABS
4.0000 mg | ORAL_TABLET | Freq: Four times a day (QID) | ORAL | Status: DC | PRN
Start: 2014-04-02 — End: 2014-04-08

## 2014-04-02 MED ORDER — FENTANYL CITRATE 0.05 MG/ML IJ SOLN
INTRAMUSCULAR | Status: DC | PRN
  Administered 2014-04-02: 50 ug via INTRAVENOUS
  Administered 2014-04-02 (×2): 25 ug via INTRAVENOUS
  Administered 2014-04-02: 100 ug via INTRAVENOUS
  Administered 2014-04-02: 50 ug via INTRAVENOUS

## 2014-04-02 MED ORDER — LACTATED RINGERS IV SOLN: INTRAVENOUS | Status: DC

## 2014-04-02 MED ORDER — HYDROMORPHONE HCL 2 MG/ML IJ SOLN
INTRAMUSCULAR | Status: AC
  Filled 2014-04-02: qty 1

## 2014-04-02 MED ORDER — ENOXAPARIN SODIUM 40 MG/0.4ML ~~LOC~~ SOLN
40.0000 mg | SUBCUTANEOUS | Status: DC
  Administered 2014-04-03 – 2014-04-08 (×6): 40 mg via SUBCUTANEOUS
  Filled 2014-04-02 (×7): qty 0.4

## 2014-04-02 MED ORDER — KETOROLAC TROMETHAMINE 30 MG/ML IJ SOLN
INTRAMUSCULAR | Status: AC
  Filled 2014-04-02: qty 1

## 2014-04-02 MED ORDER — GLYCOPYRROLATE 0.2 MG/ML IJ SOLN
INTRAMUSCULAR | Status: DC | PRN
  Administered 2014-04-02: 0.6 mg via INTRAVENOUS

## 2014-04-02 MED ORDER — BUPIVACAINE-EPINEPHRINE 0.5% -1:200000 IJ SOLN
INTRAMUSCULAR | Status: AC
  Filled 2014-04-02: qty 1

## 2014-04-02 MED ORDER — MIDAZOLAM HCL 2 MG/2ML IJ SOLN
INTRAMUSCULAR | Status: AC
  Filled 2014-04-02: qty 2

## 2014-04-02 MED ORDER — NEOSTIGMINE METHYLSULFATE 10 MG/10ML IV SOLN
INTRAVENOUS | Status: DC | PRN
  Administered 2014-04-02: 5 mg via INTRAVENOUS

## 2014-04-02 MED ORDER — MORPHINE SULFATE 2 MG/ML IJ SOLN
2.0000 mg | INTRAMUSCULAR | Status: DC | PRN
  Administered 2014-04-02 – 2014-04-06 (×5): 2 mg via INTRAVENOUS
  Filled 2014-04-02 (×5): qty 1

## 2014-04-02 MED ORDER — KETOROLAC TROMETHAMINE 30 MG/ML IJ SOLN
INTRAMUSCULAR | Status: DC | PRN
  Administered 2014-04-02: 30 mg via INTRAVENOUS

## 2014-04-02 MED ORDER — HYDROMORPHONE HCL 1 MG/ML IJ SOLN
INTRAMUSCULAR | Status: AC
  Filled 2014-04-02: qty 1

## 2014-04-02 MED ORDER — CETYLPYRIDINIUM CHLORIDE 0.05 % MT LIQD: 7.0000 mL | Freq: Two times a day (BID) | OROMUCOSAL | Status: DC

## 2014-04-02 MED ORDER — OXYCODONE HCL 5 MG PO TABS: 5.0000 mg | ORAL_TABLET | ORAL | Status: DC | PRN

## 2014-04-02 MED ORDER — CHLORHEXIDINE GLUCONATE 0.12 % MT SOLN
15.0000 mL | Freq: Two times a day (BID) | OROMUCOSAL | Status: DC
  Administered 2014-04-03 – 2014-04-04 (×4): 15 mL via OROMUCOSAL
  Filled 2014-04-02 (×7): qty 15

## 2014-04-02 SURGICAL SUPPLY — 40 items
APL SKNCLS STERI-STRIP NONHPOA (GAUZE/BANDAGES/DRESSINGS)
BANDAGE ADH SHEER 1  50/CT (GAUZE/BANDAGES/DRESSINGS) ×9 IMPLANT
BENZOIN TINCTURE PRP APPL 2/3 (GAUZE/BANDAGES/DRESSINGS) ×2 IMPLANT
BINDER ABDOMINAL 12 ML 46-62 (SOFTGOODS) ×1 IMPLANT
CABLE HIGH FREQUENCY MONO STRZ (ELECTRODE) ×1 IMPLANT
CHLORAPREP W/TINT 26ML (MISCELLANEOUS) ×3 IMPLANT
DECANTER SPIKE VIAL GLASS SM (MISCELLANEOUS) ×2 IMPLANT
DEVICE TROCAR PUNCTURE CLOSURE (ENDOMECHANICALS) ×3 IMPLANT
DISSECTOR BLUNT TIP ENDO 5MM (MISCELLANEOUS) IMPLANT
DRAPE INCISE IOBAN 66X45 STRL (DRAPES) ×3 IMPLANT
DRAPE LAPAROSCOPIC ABDOMINAL (DRAPES) ×3 IMPLANT
DRSG TEGADERM 2-3/8X2-3/4 SM (GAUZE/BANDAGES/DRESSINGS) ×6 IMPLANT
ELECT REM PT RETURN 9FT ADLT (ELECTROSURGICAL) ×3
ELECTRODE REM PT RTRN 9FT ADLT (ELECTROSURGICAL) ×2 IMPLANT
GLOVE ECLIPSE 8.0 STRL XLNG CF (GLOVE) ×3 IMPLANT
GLOVE INDICATOR 8.0 STRL GRN (GLOVE) ×6 IMPLANT
GOWN STRL REUS W/TWL XL LVL3 (GOWN DISPOSABLE) ×9 IMPLANT
KIT BASIN OR (CUSTOM PROCEDURE TRAY) ×3 IMPLANT
MARKER SKIN DUAL TIP RULER LAB (MISCELLANEOUS) ×3 IMPLANT
MESH VENTRALIGHT ST 6X8 (Mesh Specialty) ×3 IMPLANT
MESH VENTRLGHT ELLIPSE 8X6XMFL (Mesh Specialty) IMPLANT
NDL SPNL 22GX3.5 QUINCKE BK (NEEDLE) ×2 IMPLANT
NEEDLE SPNL 22GX3.5 QUINCKE BK (NEEDLE) ×3 IMPLANT
SCISSORS LAP 5X35 DISP (ENDOMECHANICALS) ×3 IMPLANT
SET IRRIG TUBING LAPAROSCOPIC (IRRIGATION / IRRIGATOR) IMPLANT
SHEARS HARMONIC ACE PLUS 36CM (ENDOMECHANICALS) IMPLANT
SLEEVE XCEL OPT CAN 5 100 (ENDOMECHANICALS) ×4 IMPLANT
SOLUTION ANTI FOG 6CC (MISCELLANEOUS) ×2 IMPLANT
STRIP CLOSURE SKIN 1/2X4 (GAUZE/BANDAGES/DRESSINGS) ×2 IMPLANT
SUT MNCRL AB 4-0 PS2 18 (SUTURE) ×3 IMPLANT
SUT NOVA NAB DX-16 0-1 5-0 T12 (SUTURE) ×2 IMPLANT
SUT PROLENE 0 CT 1 CR/8 (SUTURE) IMPLANT
TACKER 5MM HERNIA 3.5CML NAB (ENDOMECHANICALS) ×2 IMPLANT
TOWEL OR 17X26 10 PK STRL BLUE (TOWEL DISPOSABLE) ×3 IMPLANT
TRAY FOLEY CATH 14FRSI W/METER (CATHETERS) ×3 IMPLANT
TRAY LAPAROSCOPIC (CUSTOM PROCEDURE TRAY) ×3 IMPLANT
TROCAR BLADELESS OPT 5 100 (ENDOMECHANICALS) ×3 IMPLANT
TROCAR XCEL BLUNT TIP 100MML (ENDOMECHANICALS) IMPLANT
TROCAR XCEL NON-BLD 11X100MML (ENDOMECHANICALS) ×1 IMPLANT
TUBING INSUFFLATION 10FT LAP (TUBING) ×2 IMPLANT

## 2014-04-02 NOTE — Op Note (Signed)
Operative Note  Ogechi Kuehnel female 54 y.o. 04/02/2014  PREOPERATIVE DX:  Ventral incisional hernias  POSTOPERATIVE DX:  Same  PROCEDURE:   Laparoscopic lysis of adhesions (30 minutes) and repair of ventral incisional hernias with mesh         Surgeon: Odis Hollingshead   Assistants: Autumn Messing M.D.  Anesthesia: General endotracheal anesthesia  Indications:   This is a 54 year old female who underwent a previous low anterior resection for rectal cancer. She is developed pain Incision and at least 3 incisional hernias containing fat. She also has some right upper quadrant abdominal wall pain but no good explanation for this. There is no evidence of metastatic disease on the CT scan. She now presents for the above repair.    Procedure Detail:  She was brought to the operating room, placed supine on the operating table, and given a general anesthetic. A Foley catheter was inserted. An oral gastric tube was inserted. The abdominal wall was widely sterilely prepped and draped.  She placed in slight reverse Trendelenburg position and a 5 mm incision was made in the left upper quadrant subcostal area. Using a 5 mm Optiview trocar and a 5 mm laparoscope access was gained into the peritoneal cavity and a pneumoperitoneum was created.  Inspection under the trocar demonstrated no evidence of organ injury or bleeding. Adhesions were noted between the omentum and the anterior abdominal wall. There are also adhesions between the liver and intra-abdominal wall on the left side of the falciform ligament. There were adhesions between the omentum and falciform ligament as well.  Under laparoscopic vision, a 5 mm trocar was placed in the left lower quadrant area. Using blunt and sharp dissection began dividing the adhesions and mobilizing the omentum free from the anterior abdominal wall. When most of the upper abdominal adhesions had been freed up, an 11 mm trocar was placed through a right upper  quadrant incision and a 5 mm trocar was placed in the right lower quadrant incision. I identified hernias in the supraumbilical area. There were 2 small hernias that were noted to contain fat and this was reduced. There are 2 small hernias in the lower midline area that were identified containing fat. These hernias were reduced. I then mobilized the falciform ligament by dissecting it partially free from the anterior abdominal wall.  At this point, all hernias were exposed. Using a spinal needle identified the edges of the hernias and measured 4 cm away from them.  This measured to be 18 cm x 14 cm. A piece of 20 cm x 15 cm mesh was brought into the field. This was composite mesh with one side coated with non-adherent material. I placed 8 anchoring sutures of #1 Novafil around the periphery of the mesh and then oriented and marked it with a marking pen. The mesh was then hydrated and placed into the abdominal cavity through the right upper quadrant 11 mm trocar. The mesh was deployed so that the rough side was facing the abdominal wall. 8 stab incisions were then made around the abdominal wall and the anchoring sutures were pulled up across the fascial bridge and tied down anchoring the mesh to the anterior abdominal wall. The periphery of the mesh was then further anchored to the anterior abdominal wall with spiral tacks. An inner rim of tacks were also placed. This provided for adequate coverage with good overlap of the ventral incisional hernias.  The abdominal cavity was inspected and there was no evidence of bleeding or organ  injury. The pneumoperitoneum was released and I watched the viscera approximate the mesh. The trochars were removed.  All skin incisions were closed with 4-0 Monocryl subcuticular stitches. Steri-Strips and sterile dressings were applied. An abdominal binder was applied.  She tolerated the procedure well without any apparent complications and was taken to the recovery room in  satisfactory condition    Findings:  There were 4 small ventral incisional hernias present containing fat. There are some adhesions between the liver and the abdominal wall. There are no liver masses. There is no obvious explanation for her right upper quadrant abdominal wall pain.   Estimated Blood Loss:  150 ml         Specimens: none        Complications:  * No complications entered in OR log *         Disposition: PACU - hemodynamically stable.         Condition: stable

## 2014-04-02 NOTE — Transfer of Care (Signed)
Immediate Anesthesia Transfer of Care Note  Patient: [name] [name]  Procedure(s) Performed: Procedure(s): LAPAROSCOPIC  LYSIS OF ADHESIONS AND REPAIR OF VENTRAL INCISIONAL HERNIAS WITH MESH (N/A) LAPAROSCOPIC LYSIS OF ADHESIONS  Patient Location: PACU  Anesthesia Type:General  Level of Consciousness: Patient easily awoken, sedated, comfortable, cooperative, following commands, responds to stimulation.   Airway & Oxygen Therapy: Patient spontaneously breathing, ventilating well, oxygen via simple oxygen mask.  Post-op Assessment: Report given to PACU RN, vital signs reviewed and stable, moving all extremities.   Post vital signs: Reviewed and stable.  Complications: No apparent anesthesia complications

## 2014-04-02 NOTE — Discharge Instructions (Addendum)
[facility] _______[facility]   HERNIA REPAIR: POST OP INSTRUCTIONS  Always review your discharge instruction sheet given to you by the facility where your surgery was performed. IF YOU HAVE DISABILITY OR FAMILY LEAVE FORMS, YOU MUST BRING THEM TO THE OFFICE FOR PROCESSING.   DO NOT GIVE THEM TO YOUR DOCTOR.  1. A  prescription for pain medication may be given to you upon discharge.  Take your pain medication as prescribed, if needed.  If narcotic pain medicine is not needed, then you may take acetaminophen (Tylenol) or ibuprofen (Advil) as needed. 2. Take your usually prescribed medications unless otherwise directed. 3. If you need a refill on your pain medication, please contact your pharmacy.  They will contact our office to request authorization. Prescriptions will not be filled after 5 pm or on week-ends. 4. You should follow a light diet the first 24 hours after arrival home, such as soup and crackers, etc.  Be sure to include lots of fluids daily.  Resume your normal diet the day after surgery. 5. Most patients will experience some swelling and bruising.  Ice packs and reclining will help.  Swelling and bruising can take several days to resolve.  6. It is common to experience some constipation if taking pain medication after surgery.  Increasing fluid intake and taking a stool softener (such as Colace) will usually help or prevent this problem from occurring.  A mild laxative (Milk of Magnesia or Miralax) should be taken according to package directions if there are no bowel movements after 48 hours. 7. Unless discharge instructions indicate otherwise, you may remove your bandages 72 hours after surgery, and you may shower at that time.  You may have steri-strips (small skin tapes) in place directly over the incision.  These strips should be left on the skin.  If your surgeon used skin glue on the incision, you may shower in 24 hours.  The glue will flake off over the next 2-3 weeks.   Any sutures or staples will be removed at the office during your follow-up visit. 8. ACTIVITIES:  You may resume regular (light) daily activities beginning the next day--such as daily self-care, walking, climbing stairs--gradually increasing activities as tolerated.  You may have sexual intercourse when it is comfortable.  Refrain from any heavy lifting or straining-nothing over 10 pounds for 6 weeks.  a. You may drive when you are no longer taking prescription pain medication, you can comfortably wear a seatbelt, and you can safely maneuver your car and apply brakes. b. RETURN TO WORK:  _When released by MD_________________________________________________________ 9. You should see your doctor in the office for a follow-up appointment approximately 2-3 weeks after your surgery.  Make sure that you call for this appointment within a day or two after you arrive home to insure a convenient appointment time. 10. OTHER INSTRUCTIONS:  __________________________________________________________________________________________________________________________________________________________________________________________  WHEN TO CALL YOUR DOCTOR: 1. Fever over 101.0 2. Inability to urinate 3. Nausea and/or vomiting 4. Extreme swelling or bruising 5. Continued bleeding from incision. 6. Increased pain, redness, or drainage from the incision  The clinic staff is available to answer your questions during regular business hours.  Please dont hesitate to call and ask to speak to one of the nurses for clinical concerns.  If you have a medical emergency, go to the nearest emergency room or call 911.  A surgeon from [facility] is always on call at the hospital   [address] [place], [place], [place]  [address] ?  [address], [place], [place]   [address] [phone] ? [phone] ? FAX [phone] Web site: [email or web address]

## 2014-04-02 NOTE — H&P (Signed)
[name] [name] is an 54 y.o. female.   Chief Complaint:   Here for elective surgery HPI:  She has symptomatic ventral incisional hernias following partial colectomy for colon cancer.  She also has unexplained RUQ pain.  Past Medical History  Diagnosis Date  . Cancer     rectal ca  . Colon cancer   . Headache   . Anemia     Past Surgical History  Procedure Laterality Date  . Colon surgery    . Colonoscopy    . Polypectomy    . Colon cancer    . Cesarean section      Family History  Problem Relation Age of Onset  . Colon cancer Maternal Grandmother   . Colon cancer Maternal Aunt    Social History:  reports that she quit smoking about 11 years ago. Her smoking use included Cigarettes. She has a 3.25 pack-year smoking history. She has never used smokeless tobacco. She reports that she drinks alcohol. She reports that she does not use illicit drugs.  Allergies:  Allergies  Allergen Reactions  . Epinephrine Anaphylaxis  . Lidocaine Anaphylaxis    Medications Prior to Admission  Medication Sig Dispense Refill  . ibuprofen (ADVIL,MOTRIN) 200 MG tablet Take 400 mg by mouth every 6 (six) hours as needed (Pain).      No results found for this or any previous visit (from the past 48 hour(s)). No results found.  Review of Systems  Constitutional: Negative for fever and chills.  Gastrointestinal: Positive for abdominal pain. Negative for nausea, vomiting and diarrhea.    Blood pressure 123/79, pulse 98, temperature 98.3 F (36.8 C), temperature source Oral, resp. rate 20, height 5' (1.524 m), weight 195 lb (88.451 kg), SpO2 98 %. Physical Exam  Constitutional:  Overweight female in NAD.  HENT:  Head: Normocephalic and atraumatic.  Eyes: EOM are normal.  GI: Soft. There is tenderness (RUQ at area of discoloration.).  Midline scar with palpable bulge to left of lower midline  Neurological: She is alert.  Skin: Skin is warm and dry.  Psychiatric: She has a normal mood  and affect. Her behavior is normal.     Assessment/Plan Ventral incisional hernias  Plan:  Laparoscopic possible open repair with mesh.  Jeniece Hannis J 04/02/2014, 10:03 AM

## 2014-04-02 NOTE — Interval H&P Note (Signed)
History and Physical Interval Note:  04/02/2014 10:06 AM  [name] [name]  has presented today for surgery, with the diagnosis of VENTRAL INCISIONAL HERNIA  The various methods of treatment have been discussed with the patient and family. After consideration of risks, benefits and other options for treatment, the patient has consented to  Procedure(s): [place] (N/A) as a surgical intervention .  The patient's history has been reviewed, patient examined, no change in status, stable for surgery.  I have reviewed the patient's chart and labs.  Questions were answered to the patient's satisfaction.     [name]

## 2014-04-02 NOTE — Anesthesia Preprocedure Evaluation (Addendum)
Anesthesia Evaluation  Patient identified by MRN, date of birth, ID band Patient awake    Reviewed: Allergy & Precautions, H&P , NPO status , Patient's Chart, lab work & pertinent test results  Airway Mallampati: II  TM Distance: >3 FB Neck ROM: full    Dental no notable dental hx. (+) Teeth Intact, Dental Advisory Given   Pulmonary neg pulmonary ROS, former smoker,  breath sounds clear to auscultation  Pulmonary exam normal       Cardiovascular Exercise Tolerance: Good negative cardio ROS  Rhythm:regular Rate:Normal     Neuro/Psych negative neurological ROS  negative psych ROS   GI/Hepatic Neg liver ROS, Rectal ca   Endo/Other  negative endocrine ROSMorbid obesity  Renal/GU negative Renal ROS  negative genitourinary   Musculoskeletal   Abdominal (+) + obese,   Peds  Hematology negative hematology ROS (+)   Anesthesia Other Findings Went to ICU for 3 days with past surgery/ anesthesia  Reproductive/Obstetrics negative OB ROS                            Anesthesia Physical Anesthesia Plan  ASA: III  Anesthesia Plan: General   Post-op Pain Management:    Induction: Intravenous  Airway Management Planned: Oral ETT  Additional Equipment:   Intra-op Plan:   Post-operative Plan: Extubation in OR  Informed Consent: I have reviewed the patients History and Physical, chart, labs and discussed the procedure including the risks, benefits and alternatives for the proposed anesthesia with the patient or authorized representative who has indicated his/her understanding and acceptance.   Dental Advisory Given  Plan Discussed with: CRNA and Surgeon  Anesthesia Plan Comments:         Anesthesia Quick Evaluation

## 2014-04-02 NOTE — Anesthesia Procedure Notes (Signed)
Procedure Name: Intubation Date/Time: 04/02/2014 10:27 AM Performed by: Jackolyn Confer Pre-anesthesia Checklist: Patient identified, Emergency Drugs available, Suction available and Patient being monitored Patient Re-evaluated:Patient Re-evaluated prior to inductionOxygen Delivery Method: Circle system utilized Preoxygenation: Pre-oxygenation with 100% oxygen Intubation Type: IV induction Ventilation: Mask ventilation without difficulty Laryngoscope Size: Glidescope and 4 Grade View: Grade I Tube type: Oral Tube size: 7.0 mm Number of attempts: 2 Airway Equipment and Method: Bougie stylet Placement Confirmation: ETT inserted through vocal cords under direct vision,  breath sounds checked- equal and bilateral and positive ETCO2 Secured at: 20 cm Tube secured with: Tape Dental Injury: Teeth and Oropharynx as per pre-operative assessment  Difficulty Due To: Difficulty was unanticipated, Difficult Airway- due to large tongue and Difficult Airway- due to anterior larynx Comments: DVL x 1 by CRNA with mac 4, grade 3 view, DVL x 1 by CRNA with glidescope grade 1 view. Pt with redundant tissue, large tongue and anterior larynx.

## 2014-04-02 NOTE — Anesthesia Postprocedure Evaluation (Signed)
  Anesthesia Post-op Note  Patient: [name] [name]  Procedure(s) Performed: Procedure(s) (LRB): LAPAROSCOPIC  LYSIS OF ADHESIONS AND REPAIR OF VENTRAL INCISIONAL HERNIAS WITH MESH (N/A) LAPAROSCOPIC LYSIS OF ADHESIONS  Patient Location: PACU  Anesthesia Type: General  Level of Consciousness: awake and alert   Airway and Oxygen Therapy: Patient Spontanous Breathing  Post-op Pain: mild  Post-op Assessment: Post-op Vital signs reviewed, Patient's Cardiovascular Status Stable, Respiratory Function Stable, Patent Airway and No signs of Nausea or vomiting  Last Vitals:  Filed Vitals:   04/02/14 1315  BP: 124/71  Pulse: 85  Temp:   Resp: 17    Post-op Vital Signs: stable   Complications: No apparent anesthesia complications

## 2014-04-03 NOTE — Progress Notes (Signed)
UR completed 

## 2014-04-03 NOTE — Progress Notes (Signed)
Patient ID: [name] [name], female   DOB: [birth year], 54 y.o.   MRN: [id number] 1 Day Post-Op  Subjective: Moderate abdominal pain, still requiring IV medications. Some sore throat from the ETT. No nausea on clear liquids and wants more to eat.   Objective: Vital signs in last 24 hours: Temp:  [97.4 F (36.3 C)-98.3 F (36.8 C)] 97.8 F (36.6 C) (01/30 0530) Pulse Rate:  [64-105] 87 (01/30 0530) Resp:  [11-18] 16 (01/30 0530) BP: (119-145)/(60-85) 119/68 mmHg (01/30 0530) SpO2:  [90 %-100 %] 100 % (01/30 0530) Last BM Date: 04/02/14  Intake/Output from previous day: 01/29 0701 - 01/30 0700 In: 4295.4 [P.O.:360; I.V.:3935.4] Out: 3075 [Urine:3075] Intake/Output this shift:    General appearance: alert, cooperative and mild distress Resp: no wheezing or increased work of breathing GI: mild appropriate incisional tenderness Incision/Wound: dressing dry without bleeding or unusual drainage  Lab Results:  No results for input(s): WBC, HGB, HCT, PLT in the last 72 hours. BMET No results for input(s): NA, K, CL, CO2, GLUCOSE, BUN, CREATININE, CALCIUM in the last 72 hours.   Studies/Results: No results found.  Anti-infectives: Anti-infectives    Start     Dose/Rate Route Frequency Ordered Stop   04/02/14 1800  ceFAZolin (ANCEF) IVPB 1 g/50 mL premix     1 g100 mL/hr over 30 Minutes Intravenous Every 6 hours 04/02/14 1359 04/03/14 0558   04/02/14 0901  ceFAZolin (ANCEF) IVPB 2 g/50 mL premix     2 g100 mL/hr over 30 Minutes Intravenous On call to O.R. 04/02/14 0901 04/02/14 1042      Assessment/Plan: s/p Procedure(s): LAPAROSCOPIC  LYSIS OF ADHESIONS AND REPAIR OF VENTRAL INCISIONAL HERNIAS WITH MESH LAPAROSCOPIC LYSIS OF ADHESIONS Stable postoperatively. Advance to full liquid diet. Activity encouraged.   LOS: 1 day    Olly Shiner T 04/03/2014

## 2014-04-04 DIAGNOSIS — R51 Headache: Secondary | ICD-10-CM | POA: Diagnosis not present

## 2014-04-04 DIAGNOSIS — R339 Retention of urine, unspecified: Secondary | ICD-10-CM | POA: Diagnosis not present

## 2014-04-04 DIAGNOSIS — Z800 Family history of malignant neoplasm of digestive organs: Secondary | ICD-10-CM | POA: Diagnosis not present

## 2014-04-04 DIAGNOSIS — Z87891 Personal history of nicotine dependence: Secondary | ICD-10-CM | POA: Diagnosis not present

## 2014-04-04 DIAGNOSIS — Z01812 Encounter for preprocedural laboratory examination: Secondary | ICD-10-CM | POA: Diagnosis not present

## 2014-04-04 DIAGNOSIS — K567 Ileus, unspecified: Secondary | ICD-10-CM | POA: Diagnosis not present

## 2014-04-04 DIAGNOSIS — Z6838 Body mass index (BMI) 38.0-38.9, adult: Secondary | ICD-10-CM | POA: Diagnosis not present

## 2014-04-04 DIAGNOSIS — Z85048 Personal history of other malignant neoplasm of rectum, rectosigmoid junction, and anus: Secondary | ICD-10-CM | POA: Diagnosis not present

## 2014-04-04 DIAGNOSIS — K660 Peritoneal adhesions (postprocedural) (postinfection): Secondary | ICD-10-CM | POA: Diagnosis present

## 2014-04-04 DIAGNOSIS — K432 Incisional hernia without obstruction or gangrene: Secondary | ICD-10-CM | POA: Diagnosis present

## 2014-04-04 MED ORDER — ACETAMINOPHEN 325 MG PO TABS
650.0000 mg | ORAL_TABLET | Freq: Four times a day (QID) | ORAL | Status: DC | PRN
  Administered 2014-04-04 – 2014-04-05 (×2): 650 mg via ORAL
  Filled 2014-04-04 (×2): qty 2

## 2014-04-04 MED ORDER — SALINE SPRAY 0.65 % NA SOLN
1.0000 | NASAL | Status: DC | PRN
  Administered 2014-04-04: 1 via NASAL
  Filled 2014-04-04: qty 44

## 2014-04-04 NOTE — Progress Notes (Signed)
Patient ID: [name] [name], female   DOB: [birth year], 54 y.o.   MRN: [id number] 2 Days Post-Op  Subjective: Doesn't feel well. Complaining of headache. Has generalized abdominal pain with motion the same as yesterday. Tolerating some full liquids. No nausea or vomiting but having a lot of belching. No flatus yet.  Objective: Vital signs in last 24 hours: Temp:  [97.9 F (36.6 C)-98.6 F (37 C)] 98.5 F (36.9 C) (01/31 0555) Pulse Rate:  [94-98] 98 (01/31 0555) Resp:  [16] 16 (01/31 0555) BP: (114-135)/(65-84) 128/78 mmHg (01/31 0555) SpO2:  [97 %-100 %] 98 % (01/31 0555) Last BM Date: 04/02/14  Intake/Output from previous day: 01/30 0701 - 01/31 0700 In: 2713.3 [P.O.:540; I.V.:2173.3] Out: 850 [Urine:850] Intake/Output this shift: Total I/O In: 120 [P.O.:120] Out: 250 [Urine:250]  General appearance: alert, cooperative and mild distress GI: moderate mid abdominal tenderness. Mild distention. Incision/Wound: clean and dry  Lab Results:  No results for input(s): WBC, HGB, HCT, PLT in the last 72 hours. BMET No results for input(s): NA, K, CL, CO2, GLUCOSE, BUN, CREATININE, CALCIUM in the last 72 hours.   Studies/Results: No results found.  Anti-infectives: Anti-infectives    Start     Dose/Rate Route Frequency Ordered Stop   04/02/14 1800  ceFAZolin (ANCEF) IVPB 1 g/50 mL premix     1 g100 mL/hr over 30 Minutes Intravenous Every 6 hours 04/02/14 1359 04/03/14 0558   04/02/14 0901  ceFAZolin (ANCEF) IVPB 2 g/50 mL premix     2 g100 mL/hr over 30 Minutes Intravenous On call to O.R. 04/02/14 0901 04/02/14 1042      Assessment/Plan: s/p Procedure(s): LAPAROSCOPIC  LYSIS OF ADHESIONS AND REPAIR OF VENTRAL INCISIONAL HERNIAS WITH MESH LAPAROSCOPIC LYSIS OF ADHESIONS Her pain appears to be incisional. Still some ileus. Continue full liquids only. She is ambulating. Try Tylenol for headache. Not ready for discharge. Check labs in a.m.   LOS: 2 days     Sriyan Cutting T 04/04/2014

## 2014-04-04 NOTE — Progress Notes (Signed)
UR completed 

## 2014-04-05 ENCOUNTER — Encounter (HOSPITAL_COMMUNITY): Payer: Self-pay | Admitting: General Surgery

## 2014-04-05 LAB — BASIC METABOLIC PANEL
Anion gap: 8 (ref 5–15)
BUN: 7 mg/dL (ref 6–23)
CALCIUM: 8.6 mg/dL (ref 8.4–10.5)
CO2: 31 mmol/L (ref 19–32)
Chloride: 103 mmol/L (ref 96–112)
Creatinine, Ser: 0.93 mg/dL (ref 0.50–1.10)
GFR calc Af Amer: 79 mL/min — ABNORMAL LOW (ref >90)
GFR, EST NON AFRICAN AMERICAN: 68 mL/min — AB (ref >90)
Glucose, Bld: 134 mg/dL — ABNORMAL HIGH (ref 70–99)
Potassium: 3.8 mmol/L (ref 3.5–5.1)
Sodium: 142 mmol/L (ref 135–145)

## 2014-04-05 LAB — CBC
HEMATOCRIT: 34.5 % — AB (ref 36.0–46.0)
Hemoglobin: 10.5 g/dL — ABNORMAL LOW (ref 12.0–15.0)
MCH: 22 pg — ABNORMAL LOW (ref 26.0–34.0)
MCHC: 30.4 g/dL (ref 30.0–36.0)
MCV: 72.2 fL — AB (ref 78.0–100.0)
Platelets: 233 10*3/uL (ref 150–400)
RBC: 4.78 MIL/uL (ref 3.87–5.11)
RDW: 14.9 % (ref 11.5–15.5)
WBC: 5.8 10*3/uL (ref 4.0–10.5)

## 2014-04-05 MED ORDER — CETYLPYRIDINIUM CHLORIDE 0.05 % MT LIQD
7.0000 mL | Freq: Two times a day (BID) | OROMUCOSAL | Status: DC
Start: 2014-04-05 — End: 2014-04-05
  Administered 2014-04-05: 7 mL via OROMUCOSAL

## 2014-04-05 MED ORDER — CHLORHEXIDINE GLUCONATE 0.12 % MT SOLN
15.0000 mL | Freq: Two times a day (BID) | OROMUCOSAL | Status: DC
  Administered 2014-04-05: 15 mL via OROMUCOSAL

## 2014-04-05 NOTE — Progress Notes (Signed)
Pt c/o that she was still having trouble emptying her bladder and had to urinate often.  Bladder scanned approx 550 cc. Notified MD and obtained order to insert foley.

## 2014-04-05 NOTE — Progress Notes (Signed)
Pt reported that she was having to urinate every hour. Obtained bladder scanned that read she was retaining over 500 cc urine. Performed in and out cath per previous order. Cathed approx 1000 cc urine. Will continue to monitor.

## 2014-04-05 NOTE — Progress Notes (Signed)
3 Days Post-Op  Subjective: Passing some gas.  Tolerating liquid diet.  Sinus headache is better.  Unable to void well so foley catheter inserted.  Objective: Vital signs in last 24 hours: Temp:  [98.5 F (36.9 C)-100.2 F (37.9 C)] 98.6 F (37 C) (02/01 1039) Pulse Rate:  [98-108] 98 (02/01 1039) Resp:  [16-18] 18 (02/01 1039) BP: (106-148)/(75-85) 106/75 mmHg (02/01 1039) SpO2:  [94 %-99 %] 98 % (02/01 1039) Last BM Date: 04/02/14  Intake/Output from previous day: 01/31 0701 - 02/01 0700 In: 1929.6 [P.O.:600; I.V.:1329.6] Out: 3650 [Urine:3650] Intake/Output this shift: Total I/O In: -  Out: 900 [Urine:900]  PE: General- In NAD Abdomen-soft, dressings dry, bowel sounds present.  Lab Results:   Recent Labs  04/05/14 0521  WBC 5.8  HGB 10.5*  HCT 34.5*  PLT 233   BMET  Recent Labs  04/05/14 0521  NA 142  K 3.8  CL 103  CO2 31  GLUCOSE 134*  BUN 7  CREATININE 0.93  CALCIUM 8.6   PT/INR No results for input(s): LABPROT, INR in the last 72 hours. Comprehensive Metabolic Panel:    Component Value Date/Time   NA 142 04/05/2014 0521   NA 138 03/25/2014 1300   NA 139 10/26/2011 0957   NA 137 10/31/2010 0843   K 3.8 04/05/2014 0521   K 4.4 03/25/2014 1300   K 4.3 10/26/2011 0957   K 4.4 10/31/2010 0843   CL 103 04/05/2014 0521   CL 104 03/25/2014 1300   CL 103 10/26/2011 0957   CL 102 10/31/2010 0843   CO2 31 04/05/2014 0521   CO2 27 03/25/2014 1300   CO2 27 10/26/2011 0957   CO2 26 10/31/2010 0843   BUN 7 04/05/2014 0521   BUN 12 03/25/2014 1300   BUN 10 10/26/2011 0957   BUN 11 10/31/2010 0843   CREATININE 0.93 04/05/2014 0521   CREATININE 0.97 03/25/2014 1300   CREATININE 0.90 12/23/2012 0934   CREATININE 0.8 10/26/2011 0957   GLUCOSE 134* 04/05/2014 0521   GLUCOSE 153* 03/25/2014 1300   GLUCOSE 119* 10/26/2011 0957   GLUCOSE 113 10/31/2010 0843   CALCIUM 8.6 04/05/2014 0521   CALCIUM 8.7 03/25/2014 1300   CALCIUM 8.5 10/26/2011 0957    CALCIUM 8.6 10/31/2010 0843   AST 26 03/25/2014 1300   AST 13 12/23/2012 0934   AST 20 10/26/2011 0957   AST 18 10/31/2010 0843   ALT 14 03/25/2014 1300   ALT 11 12/23/2012 0934   ALT 21 10/26/2011 0957   ALT 11 10/31/2010 0843   ALKPHOS 72 03/25/2014 1300   ALKPHOS 64 12/23/2012 0934   ALKPHOS 78 10/26/2011 0957   ALKPHOS 71 10/31/2010 0843   BILITOT 1.0 03/25/2014 1300   BILITOT 0.4 12/23/2012 0934   BILITOT 0.60 10/26/2011 0957   BILITOT 0.60 10/31/2010 0843   PROT 6.9 03/25/2014 1300   PROT 6.9 12/23/2012 0934   PROT 7.0 10/26/2011 0957   PROT 6.6 10/31/2010 0843   ALBUMIN 3.9 03/25/2014 1300   ALBUMIN 4.3 12/23/2012 0934     Studies/Results: No results found.  Anti-infectives: Anti-infectives    Start     Dose/Rate Route Frequency Ordered Stop   04/02/14 1800  ceFAZolin (ANCEF) IVPB 1 g/50 mL premix     1 g100 mL/hr over 30 Minutes Intravenous Every 6 hours 04/02/14 1359 04/03/14 0558   04/02/14 0901  ceFAZolin (ANCEF) IVPB 2 g/50 mL premix     2 g100 mL/hr over 30 Minutes Intravenous  On call to O.R. 04/02/14 0901 04/02/14 1042      Assessment Active Problems:  S/p laparoscopic repair of incision hernias with mesh 04/02/14-postop ileus is resolving   Acute urinary retention-foley placed this AM.    LOS: 3 days   Plan: Advance to solid diet.  Mobilize.   Caid Radin J 04/05/2014

## 2014-04-06 MED ORDER — DOCUSATE SODIUM 100 MG PO CAPS
100.0000 mg | ORAL_CAPSULE | Freq: Two times a day (BID) | ORAL | Status: DC
  Administered 2014-04-06 – 2014-04-07 (×3): 100 mg via ORAL
  Filled 2014-04-06 (×5): qty 1

## 2014-04-06 MED ORDER — BISACODYL 10 MG RE SUPP
10.0000 mg | Freq: Every day | RECTAL | Status: DC | PRN
Start: 2014-04-06 — End: 2014-04-08
  Administered 2014-04-06: 10 mg via RECTAL
  Filled 2014-04-06: qty 1

## 2014-04-06 MED ORDER — HYDROCODONE-ACETAMINOPHEN 5-325 MG PO TABS
1.0000 | ORAL_TABLET | ORAL | Status: DC | PRN
Start: 2014-04-06 — End: 2014-04-08

## 2014-04-06 NOTE — Progress Notes (Signed)
Pt c/o difficulty moving bowels stating "I can feel it in my rectum". MD notified. Order obtained for digital rectal exam. Formed stool felt in rectum. New orders obtained for suppository and stool softener. Will continue to monitor.

## 2014-04-06 NOTE — Progress Notes (Signed)
4 Days Post-Op  Subjective: Tolerating diet.  Feels like she has to have a BM.  Had n/v after trying OxyIR, passing gas.  Objective: Vital signs in last 24 hours: Temp:  [98.3 F (36.8 C)-98.6 F (37 C)] 98.3 F (36.8 C) (02/02 0512) Pulse Rate:  [84-98] 84 (02/02 0512) Resp:  [16-18] 16 (02/02 0512) BP: (122-148)/(54-83) 122/54 mmHg (02/02 0512) SpO2:  [95 %-100 %] 95 % (02/02 0512) Last BM Date: 04/02/14  Intake/Output from previous day: 02/01 0701 - 02/02 0700 In: 1920.8 [P.O.:720; I.V.:1200.8] Out: 2750 [Urine:2750] Intake/Output this shift: Total I/O In: 203.3 [I.V.:203.3] Out: -   PE: General- In NAD Abdomen-soft, incisions are clean and intact Lab Results:   Recent Labs  04/05/14 0521  WBC 5.8  HGB 10.5*  HCT 34.5*  PLT 233   BMET  Recent Labs  04/05/14 0521  NA 142  K 3.8  CL 103  CO2 31  GLUCOSE 134*  BUN 7  CREATININE 0.93  CALCIUM 8.6   PT/INR No results for input(s): LABPROT, INR in the last 72 hours. Comprehensive Metabolic Panel:    Component Value Date/Time   NA 142 04/05/2014 0521   NA 138 03/25/2014 1300   NA 139 10/26/2011 0957   NA 137 10/31/2010 0843   K 3.8 04/05/2014 0521   K 4.4 03/25/2014 1300   K 4.3 10/26/2011 0957   K 4.4 10/31/2010 0843   CL 103 04/05/2014 0521   CL 104 03/25/2014 1300   CL 103 10/26/2011 0957   CL 102 10/31/2010 0843   CO2 31 04/05/2014 0521   CO2 27 03/25/2014 1300   CO2 27 10/26/2011 0957   CO2 26 10/31/2010 0843   BUN 7 04/05/2014 0521   BUN 12 03/25/2014 1300   BUN 10 10/26/2011 0957   BUN 11 10/31/2010 0843   CREATININE 0.93 04/05/2014 0521   CREATININE 0.97 03/25/2014 1300   CREATININE 0.90 12/23/2012 0934   CREATININE 0.8 10/26/2011 0957   GLUCOSE 134* 04/05/2014 0521   GLUCOSE 153* 03/25/2014 1300   GLUCOSE 119* 10/26/2011 0957   GLUCOSE 113 10/31/2010 0843   CALCIUM 8.6 04/05/2014 0521   CALCIUM 8.7 03/25/2014 1300   CALCIUM 8.5 10/26/2011 0957   CALCIUM 8.6 10/31/2010 0843    AST 26 03/25/2014 1300   AST 13 12/23/2012 0934   AST 20 10/26/2011 0957   AST 18 10/31/2010 0843   ALT 14 03/25/2014 1300   ALT 11 12/23/2012 0934   ALT 21 10/26/2011 0957   ALT 11 10/31/2010 0843   ALKPHOS 72 03/25/2014 1300   ALKPHOS 64 12/23/2012 0934   ALKPHOS 78 10/26/2011 0957   ALKPHOS 71 10/31/2010 0843   BILITOT 1.0 03/25/2014 1300   BILITOT 0.4 12/23/2012 0934   BILITOT 0.60 10/26/2011 0957   BILITOT 0.60 10/31/2010 0843   PROT 6.9 03/25/2014 1300   PROT 6.9 12/23/2012 0934   PROT 7.0 10/26/2011 0957   PROT 6.6 10/31/2010 0843   ALBUMIN 3.9 03/25/2014 1300   ALBUMIN 4.3 12/23/2012 0934     Studies/Results: No results found.  Anti-infectives: Anti-infectives    Start     Dose/Rate Route Frequency Ordered Stop   04/02/14 1800  ceFAZolin (ANCEF) IVPB 1 g/50 mL premix     1 g100 mL/hr over 30 Minutes Intravenous Every 6 hours 04/02/14 1359 04/03/14 0558   04/02/14 0901  ceFAZolin (ANCEF) IVPB 2 g/50 mL premix     2 g100 mL/hr over 30 Minutes Intravenous On call to  O.R. 04/02/14 0901 04/02/14 1042      Assessment S/p laparoscopic repair of incision hernias with mesh 04/02/14-tolerating diet; not tolerating oral analgesic  Acute urinary retention-foley placed yesterday AM    LOS: 4 days   Plan: Change oral analgesic to Norco.  Heplock IV.  Void trial early tomorrow AM.   Odis Hollingshead 04/06/2014

## 2014-04-07 MED ORDER — MAGNESIUM HYDROXIDE 400 MG/5ML PO SUSP
30.0000 mL | Freq: Every day | ORAL | Status: DC
  Filled 2014-04-07: qty 30

## 2014-04-07 MED ORDER — HYDROCODONE-ACETAMINOPHEN 5-325 MG PO TABS: 1.0000 | ORAL_TABLET | ORAL | Status: DC | PRN

## 2014-04-07 NOTE — Progress Notes (Signed)
5 Days Post-Op  Subjective: Had multiple BMs after Dulcolax suppository.  Some bright red blood on tissue paper after last BM.  Drinking fluids.  Still quite sore.  Objective: Vital signs in last 24 hours: Temp:  [98.3 F (36.8 C)-99.5 F (37.5 C)] 99.1 F (37.3 C) (02/03 0517) Pulse Rate:  [85-94] 89 (02/03 0517) Resp:  [14-16] 14 (02/03 0517) BP: (123-134)/(65-83) 123/65 mmHg (02/03 0517) SpO2:  [98 %-100 %] 100 % (02/03 0517) Last BM Date: 04/06/14  Intake/Output from previous day: 02/02 0701 - 02/03 0700 In: 1360 [P.O.:960; I.V.:400] Out: 1900 [Urine:1900] Intake/Output this shift:    PE: General- In NAD Abdomen-soft, incisions are clean and intact Lab Results:   Recent Labs  04/05/14 0521  WBC 5.8  HGB 10.5*  HCT 34.5*  PLT 233   BMET  Recent Labs  04/05/14 0521  NA 142  K 3.8  CL 103  CO2 31  GLUCOSE 134*  BUN 7  CREATININE 0.93  CALCIUM 8.6   PT/INR No results for input(s): LABPROT, INR in the last 72 hours. Comprehensive Metabolic Panel:    Component Value Date/Time   NA 142 04/05/2014 0521   NA 138 03/25/2014 1300   NA 139 10/26/2011 0957   NA 137 10/31/2010 0843   K 3.8 04/05/2014 0521   K 4.4 03/25/2014 1300   K 4.3 10/26/2011 0957   K 4.4 10/31/2010 0843   CL 103 04/05/2014 0521   CL 104 03/25/2014 1300   CL 103 10/26/2011 0957   CL 102 10/31/2010 0843   CO2 31 04/05/2014 0521   CO2 27 03/25/2014 1300   CO2 27 10/26/2011 0957   CO2 26 10/31/2010 0843   BUN 7 04/05/2014 0521   BUN 12 03/25/2014 1300   BUN 10 10/26/2011 0957   BUN 11 10/31/2010 0843   CREATININE 0.93 04/05/2014 0521   CREATININE 0.97 03/25/2014 1300   CREATININE 0.90 12/23/2012 0934   CREATININE 0.8 10/26/2011 0957   GLUCOSE 134* 04/05/2014 0521   GLUCOSE 153* 03/25/2014 1300   GLUCOSE 119* 10/26/2011 0957   GLUCOSE 113 10/31/2010 0843   CALCIUM 8.6 04/05/2014 0521   CALCIUM 8.7 03/25/2014 1300   CALCIUM 8.5 10/26/2011 0957   CALCIUM 8.6 10/31/2010 0843    AST 26 03/25/2014 1300   AST 13 12/23/2012 0934   AST 20 10/26/2011 0957   AST 18 10/31/2010 0843   ALT 14 03/25/2014 1300   ALT 11 12/23/2012 0934   ALT 21 10/26/2011 0957   ALT 11 10/31/2010 0843   ALKPHOS 72 03/25/2014 1300   ALKPHOS 64 12/23/2012 0934   ALKPHOS 78 10/26/2011 0957   ALKPHOS 71 10/31/2010 0843   BILITOT 1.0 03/25/2014 1300   BILITOT 0.4 12/23/2012 0934   BILITOT 0.60 10/26/2011 0957   BILITOT 0.60 10/31/2010 0843   PROT 6.9 03/25/2014 1300   PROT 6.9 12/23/2012 0934   PROT 7.0 10/26/2011 0957   PROT 6.6 10/31/2010 0843   ALBUMIN 3.9 03/25/2014 1300   ALBUMIN 4.3 12/23/2012 0934     Studies/Results: No results found.  Anti-infectives: Anti-infectives    Start     Dose/Rate Route Frequency Ordered Stop   04/02/14 1800  ceFAZolin (ANCEF) IVPB 1 g/50 mL premix     1 g100 mL/hr over 30 Minutes Intravenous Every 6 hours 04/02/14 1359 04/03/14 0558   04/02/14 0901  ceFAZolin (ANCEF) IVPB 2 g/50 mL premix     2 g100 mL/hr over 30 Minutes Intravenous On call to O.R.  04/02/14 0901 04/02/14 1042      Assessment S/p laparoscopic repair of incision hernias with mesh 04/02/14-very slowly improving Acute urinary retention-voiding trial started.    LOS: 5 days   Plan: If she is able to void and does well this AM will discharge.  If not, will keep another day.   Almus Woodham Lenna Sciara 04/07/2014

## 2014-04-08 NOTE — Progress Notes (Signed)
Patient discharge instructions given along with prescriptions.  Questions answered.

## 2014-04-08 NOTE — Progress Notes (Signed)
6 Days Post-Op  Subjective: Felt weak yesterday.  Feels better today.  Bowels moving better. Objective: Vital signs in last 24 hours: Temp:  [98.3 F (36.8 C)-98.8 F (37.1 C)] 98.8 F (37.1 C) (02/04 0618) Pulse Rate:  [86-92] 89 (02/04 0618) Resp:  [14-16] 16 (02/04 0618) BP: (97-120)/(41-68) 97/41 mmHg (02/04 0618) SpO2:  [97 %-100 %] 99 % (02/04 0618) Last BM Date: 04/06/14  Intake/Output from previous day: 02/03 0701 - 02/04 0700 In: 480 [P.O.:480] Out: 1100 [Urine:1100] Intake/Output this shift:    PE: General- In NAD Abdomen-soft, incisions are clean and intact Lab Results:  No results for input(s): WBC, HGB, HCT, PLT in the last 72 hours. BMET No results for input(s): NA, K, CL, CO2, GLUCOSE, BUN, CREATININE, CALCIUM in the last 72 hours. PT/INR No results for input(s): LABPROT, INR in the last 72 hours. Comprehensive Metabolic Panel:    Component Value Date/Time   NA 142 04/05/2014 0521   NA 138 03/25/2014 1300   NA 139 10/26/2011 0957   NA 137 10/31/2010 0843   K 3.8 04/05/2014 0521   K 4.4 03/25/2014 1300   K 4.3 10/26/2011 0957   K 4.4 10/31/2010 0843   CL 103 04/05/2014 0521   CL 104 03/25/2014 1300   CL 103 10/26/2011 0957   CL 102 10/31/2010 0843   CO2 31 04/05/2014 0521   CO2 27 03/25/2014 1300   CO2 27 10/26/2011 0957   CO2 26 10/31/2010 0843   BUN 7 04/05/2014 0521   BUN 12 03/25/2014 1300   BUN 10 10/26/2011 0957   BUN 11 10/31/2010 0843   CREATININE 0.93 04/05/2014 0521   CREATININE 0.97 03/25/2014 1300   CREATININE 0.90 12/23/2012 0934   CREATININE 0.8 10/26/2011 0957   GLUCOSE 134* 04/05/2014 0521   GLUCOSE 153* 03/25/2014 1300   GLUCOSE 119* 10/26/2011 0957   GLUCOSE 113 10/31/2010 0843   CALCIUM 8.6 04/05/2014 0521   CALCIUM 8.7 03/25/2014 1300   CALCIUM 8.5 10/26/2011 0957   CALCIUM 8.6 10/31/2010 0843   AST 26 03/25/2014 1300   AST 13 12/23/2012 0934   AST 20 10/26/2011 0957   AST 18 10/31/2010 0843   ALT 14 03/25/2014  1300   ALT 11 12/23/2012 0934   ALT 21 10/26/2011 0957   ALT 11 10/31/2010 0843   ALKPHOS 72 03/25/2014 1300   ALKPHOS 64 12/23/2012 0934   ALKPHOS 78 10/26/2011 0957   ALKPHOS 71 10/31/2010 0843   BILITOT 1.0 03/25/2014 1300   BILITOT 0.4 12/23/2012 0934   BILITOT 0.60 10/26/2011 0957   BILITOT 0.60 10/31/2010 0843   PROT 6.9 03/25/2014 1300   PROT 6.9 12/23/2012 0934   PROT 7.0 10/26/2011 0957   PROT 6.6 10/31/2010 0843   ALBUMIN 3.9 03/25/2014 1300   ALBUMIN 4.3 12/23/2012 0934     Studies/Results: No results found.  Anti-infectives: Anti-infectives    Start     Dose/Rate Route Frequency Ordered Stop   04/02/14 1800  ceFAZolin (ANCEF) IVPB 1 g/50 mL premix     1 g100 mL/hr over 30 Minutes Intravenous Every 6 hours 04/02/14 1359 04/03/14 0558   04/02/14 0901  ceFAZolin (ANCEF) IVPB 2 g/50 mL premix     2 g100 mL/hr over 30 Minutes Intravenous On call to O.R. 04/02/14 0901 04/02/14 1042      Assessment S/p laparoscopic repair of incision hernias with mesh 04/02/14-ready to go home today,.   LOS: 6 days   Plan: Discharge.  Instructions given.  Teniola Tseng Lenna Sciara 04/08/2014

## 2014-04-22 NOTE — Discharge Summary (Signed)
Physician Discharge Summary  Patient ID: [name] [name] MRN: [id number] DOB/AGE: [birth year] 54 y.o.  Admit date: 04/02/2014 Discharge date: 04/08/2014  Admission Diagnoses:  Ventral Incisional Hernias  Discharge Diagnoses:  Active Problems:   Incisional hernia   Post operative ileus   Obesity   Type II DM   Discharged Condition: good  Hospital Course: She underwent laparoscopic repair of ventral incisional hernias with mesh.  She developed a postoperative ileus that slowly resolved.  She was able to be discharged on POD # 6.  Discharge instructions were given to her.  Consults: None  Significant Diagnostic Studies: none  Treatments: surgery: Laparoscopic repair of ventral incisional hernias with mesh  Discharge Exam: Blood pressure 104/63, pulse 90, temperature 98.9 F (37.2 C), temperature source Oral, resp. rate 15, height 5' (1.524 m), weight 195 lb (88.451 kg), SpO2 98 %.   Disposition: 01-Home or Self Care     Medication List    TAKE these medications        HYDROcodone-acetaminophen 5-325 MG per tablet  Commonly known as:  NORCO/VICODIN  Take 1-2 tablets by mouth every 4 (four) hours as needed for moderate pain.     ibuprofen 200 MG tablet  Commonly known as:  ADVIL,MOTRIN  Take 400 mg by mouth every 6 (six) hours as needed (Pain).         Signed: Odis Hollingshead 04/22/2014, 9:46 AM

## 2014-09-08 ENCOUNTER — Encounter: Payer: Self-pay | Admitting: Gastroenterology

## 2014-11-16 ENCOUNTER — Ambulatory Visit: Payer: BLUE CROSS/BLUE SHIELD | Admitting: Gastroenterology

## 2015-05-03 ENCOUNTER — Encounter: Payer: Self-pay | Admitting: Family Medicine

## 2015-05-03 ENCOUNTER — Ambulatory Visit (INDEPENDENT_AMBULATORY_CARE_PROVIDER_SITE_OTHER): Payer: BLUE CROSS/BLUE SHIELD | Admitting: Family Medicine

## 2015-05-03 VITALS — BP 134/84 | HR 90 | Ht 60.5 in | Wt 199.5 lb

## 2015-05-03 DIAGNOSIS — Z1159 Encounter for screening for other viral diseases: Secondary | ICD-10-CM

## 2015-05-03 DIAGNOSIS — E118 Type 2 diabetes mellitus with unspecified complications: Secondary | ICD-10-CM | POA: Diagnosis not present

## 2015-05-03 DIAGNOSIS — I10 Essential (primary) hypertension: Secondary | ICD-10-CM

## 2015-05-03 DIAGNOSIS — A46 Erysipelas: Secondary | ICD-10-CM | POA: Diagnosis not present

## 2015-05-03 DIAGNOSIS — E669 Obesity, unspecified: Secondary | ICD-10-CM | POA: Diagnosis not present

## 2015-05-03 DIAGNOSIS — I152 Hypertension secondary to endocrine disorders: Secondary | ICD-10-CM

## 2015-05-03 DIAGNOSIS — Z9119 Patient's noncompliance with other medical treatment and regimen: Secondary | ICD-10-CM

## 2015-05-03 DIAGNOSIS — N390 Urinary tract infection, site not specified: Secondary | ICD-10-CM | POA: Diagnosis not present

## 2015-05-03 DIAGNOSIS — E1169 Type 2 diabetes mellitus with other specified complication: Secondary | ICD-10-CM

## 2015-05-03 DIAGNOSIS — E785 Hyperlipidemia, unspecified: Secondary | ICD-10-CM

## 2015-05-03 DIAGNOSIS — C189 Malignant neoplasm of colon, unspecified: Secondary | ICD-10-CM | POA: Diagnosis not present

## 2015-05-03 DIAGNOSIS — Z0000 Encounter for general adult medical examination without abnormal findings: Secondary | ICD-10-CM

## 2015-05-03 DIAGNOSIS — E1159 Type 2 diabetes mellitus with other circulatory complications: Secondary | ICD-10-CM

## 2015-05-03 DIAGNOSIS — Z91199 Patient's noncompliance with other medical treatment and regimen due to unspecified reason: Secondary | ICD-10-CM

## 2015-05-03 LAB — COMPREHENSIVE METABOLIC PANEL
ALBUMIN: 4.1 g/dL (ref 3.6–5.1)
ALK PHOS: 78 U/L (ref 33–130)
ALT: 14 U/L (ref 6–29)
AST: 18 U/L (ref 10–35)
BILIRUBIN TOTAL: 0.2 mg/dL (ref 0.2–1.2)
BUN: 8 mg/dL (ref 7–25)
CO2: 25 mmol/L (ref 20–31)
Calcium: 9.2 mg/dL (ref 8.6–10.4)
Chloride: 103 mmol/L (ref 98–110)
Creat: 0.83 mg/dL (ref 0.50–1.05)
Glucose, Bld: 127 mg/dL — ABNORMAL HIGH (ref 65–99)
Potassium: 4 mmol/L (ref 3.5–5.3)
Sodium: 139 mmol/L (ref 135–146)
Total Protein: 6.9 g/dL (ref 6.1–8.1)

## 2015-05-03 LAB — CBC WITH DIFFERENTIAL/PLATELET
BASOS ABS: 0 10*3/uL (ref 0.0–0.1)
Basophils Relative: 0 % (ref 0–1)
Eosinophils Absolute: 0.1 10*3/uL (ref 0.0–0.7)
Eosinophils Relative: 3 % (ref 0–5)
HCT: 38.7 % (ref 36.0–46.0)
HEMOGLOBIN: 12.4 g/dL (ref 12.0–15.0)
LYMPHS PCT: 54 % — AB (ref 12–46)
Lymphs Abs: 2.4 10*3/uL (ref 0.7–4.0)
MCH: 22.7 pg — ABNORMAL LOW (ref 26.0–34.0)
MCHC: 32 g/dL (ref 30.0–36.0)
MCV: 70.9 fL — ABNORMAL LOW (ref 78.0–100.0)
MONO ABS: 0.4 10*3/uL (ref 0.1–1.0)
MPV: 10.5 fL (ref 8.6–12.4)
Monocytes Relative: 9 % (ref 3–12)
Neutro Abs: 1.5 10*3/uL — ABNORMAL LOW (ref 1.7–7.7)
Neutrophils Relative %: 34 % — ABNORMAL LOW (ref 43–77)
Platelets: 270 10*3/uL (ref 150–400)
RBC: 5.46 MIL/uL — AB (ref 3.87–5.11)
RDW: 16 % — ABNORMAL HIGH (ref 11.5–15.5)
WBC: 4.5 10*3/uL (ref 4.0–10.5)

## 2015-05-03 LAB — LIPID PANEL
Cholesterol: 250 mg/dL — ABNORMAL HIGH (ref 125–200)
HDL: 32 mg/dL — AB (ref >=46)
LDL Cholesterol: 159 mg/dL — ABNORMAL HIGH (ref <130)
TRIGLYCERIDES: 296 mg/dL — AB (ref <150)
Total CHOL/HDL Ratio: 7.8 Ratio — ABNORMAL HIGH (ref <=5.0)
VLDL: 59 mg/dL — ABNORMAL HIGH (ref <30)

## 2015-05-03 LAB — POCT GLYCOSYLATED HEMOGLOBIN (HGB A1C): Hemoglobin A1C: 7.8

## 2015-05-03 MED ORDER — CLARITHROMYCIN 500 MG PO TABS: 500.0000 mg | ORAL_TABLET | Freq: Two times a day (BID) | ORAL | Status: DC

## 2015-05-03 MED ORDER — LISINOPRIL 10 MG PO TABS: 10.0000 mg | ORAL_TABLET | Freq: Every day | ORAL | Status: DC

## 2015-05-03 NOTE — Progress Notes (Signed)
Subjective:    Patient ID: [name] [name], female    DOB: [birth year], 55 y.o.   MRN: [id number]  HPI She is here for a complete examination.she was last seen here in November 2015. She does have an underlying history of colon cancer diagnosed in 2007. She apparently had surgery as well as radiation. She has been followed by Dr. Benay Spice. His last note was reviewed and recommended yearly colonoscopy however she has not followed up on that.She was also diagnosed with diabetes and not followed up on this in well over a year. She has had difficulty with abdominal wall surgery and continues to complain of pain in the incision sites. She apparently has been seen twice by general surgery and with no resolution of her symptoms. She also complains of foul-smelling urine jet that she states has been there for several months. She also has had a rash underneath both breasts that is quite irritating again lasting for several months. Presently she is on no medications for her blood pressure, diabetes or lipids. She now has a job and does have insurance. Family and social history as well as health maintenance and immunizations were reviewed.he now is involved in a exercise program with her daughter. She does have a sedentary job but apparently does walk when she has breaks at work.   Review of Systems  All other systems reviewed and are negative.      Objective:   Physical Exam BP 134/84 mmHg  Pulse 90  Ht 5' 0.5" (1.537 m)  Wt 199 lb 8 oz (90.493 kg)  BMI 38.31 kg/m2  SpO2 98%  General Appearance:    Alert, cooperative, no distress, appears stated age  Head:    Normocephalic, without obvious abnormality, atraumatic  Eyes:    PERRL, conjunctiva/corneas clear, EOM's intact, fundi    benign  Ears:    Normal TM's and external ear canals  Nose:   Nares normal, mucosa normal, no drainage or sinus   tenderness  Throat:   Lips, mucosa, and tongue normal; teeth and gums normal  Neck:   Supple, no  lymphadenopathy;  thyroid:  no   enlargement/tenderness/nodules; no carotid   bruit or JVD  Back:    Spine nontender, no curvature, ROM normal, no CVA     tenderness  Lungs:     Clear to auscultation bilaterally without wheezes, rales or     ronchi; respirations unlabored  Chest Wall:    No tenderness or deformity   Heart:    Regular rate and rhythm, S1 and S2 normal, no murmur, rub   or gallop  Breast Exam:    Deferred to GYN  Abdomen:     Soft, non-tender, nondistended, normoactive bowel sounds,    no masses, no hepatosplenomegaly  Genitalia:    Deferred to GYN     Extremities:   No clubbing, cyanosis or edema  Pulses:   2+ and symmetric all extremities  Skin:   Skin color, texture, turgor normal,slightly pigmented dry areas noted under both breasts.  Lymph nodes:   Cervical, supraclavicular, and axillary nodes normal  Neurologic:   CNII-XII intact, normal strength, sensation and gait; reflexes 2+ and symmetric throughout          Psych:   Normal mood, affect, hygiene and grooming.   Hemoglobin A1c is 7.8. Urine microscopic showed scattered red cells and bacteria TNTC       Assessment & Plan:  Routine general medical examination at a health care facility - Plan: CBC  with Differential/Platelet, Comprehensive metabolic panel, Lipid panel  Malignant neoplasm of colon, unspecified part of colon ([place]) - Plan: CBC with Differential/Platelet, Comprehensive metabolic panel, CEA, Ambulatory referral to Gastroenterology  Obesity (BMI 30-39.9) - Plan: Comprehensive metabolic panel, Lipid panel, Amb Referral to Nutrition and Diabetic E  Type 2 diabetes mellitus with complication, without long-term current use of insulin ([facility]) - Plan: CBC with Differential/Platelet, Comprehensive metabolic panel, Lipid panel, POCT glycosylated hemoglobin (Hb A1C), Amb Referral to Nutrition and Diabetic E  Hyperlipidemia associated with type 2 diabetes mellitus ([place])  Hypertension associated with diabetes ([place])  - Plan: lisinopril (PRINIVIL,ZESTRIL) 10 MG tablet  Need for hepatitis C screening test - Plan: Hepatitis C antibody  Erysipelas - Plan: clarithromycin (BIAXIN) 500 MG tablet  UTI (lower urinary tract infection)  Personal history of noncompliance with medical treatment, presenting hazards to health she will need to return for a Pap and pelvic. Discussed the need for her to go to diabetes education classes.she will be referred back for colonoscopy. Discussed diet and exercise briefly with her. She is to follow-up with me concerning the rash and the UTI when I see her again in 1 month. She realizes that she has been remiss in taking good care of herself. Over 65 minutes spent discussing all of these issues with her.

## 2015-05-03 NOTE — Patient Instructions (Signed)
Using any of the over-the-counter medicines for athlete's foot or Monistat.call in an antibiotic as well

## 2015-05-04 LAB — HEPATITIS C ANTIBODY: HCV Ab: NEGATIVE

## 2015-05-04 LAB — CEA: CEA: 1.6 ng/mL

## 2015-05-04 MED ORDER — ATORVASTATIN CALCIUM 20 MG PO TABS: 20.0000 mg | ORAL_TABLET | Freq: Every day | ORAL | Status: DC

## 2015-05-04 NOTE — Addendum Note (Signed)
Addended by: Jill Alexanders C on: 05/04/2015 12:12 PM   Modules accepted: Orders

## 2015-05-26 ENCOUNTER — Other Ambulatory Visit: Payer: Self-pay | Admitting: *Deleted

## 2015-05-26 MED ORDER — GLUCOSE BLOOD VI STRP: ORAL_STRIP | Status: DC

## 2015-05-31 ENCOUNTER — Other Ambulatory Visit (HOSPITAL_COMMUNITY)
Admission: RE | Admit: 2015-05-31 | Discharge: 2015-05-31 | Disposition: A | Discharge status: Home / Self Care | Payer: BLUE CROSS/BLUE SHIELD | Source: Ambulatory Visit | Attending: Family Medicine | Admitting: Family Medicine

## 2015-05-31 ENCOUNTER — Ambulatory Visit (INDEPENDENT_AMBULATORY_CARE_PROVIDER_SITE_OTHER): Payer: BLUE CROSS/BLUE SHIELD | Admitting: Family Medicine

## 2015-05-31 ENCOUNTER — Encounter: Payer: Self-pay | Admitting: Family Medicine

## 2015-05-31 VITALS — BP 122/74 | HR 76 | Resp 12 | Ht 60.5 in | Wt 198.6 lb

## 2015-05-31 DIAGNOSIS — Z1211 Encounter for screening for malignant neoplasm of colon: Secondary | ICD-10-CM

## 2015-05-31 DIAGNOSIS — Z01419 Encounter for gynecological examination (general) (routine) without abnormal findings: Secondary | ICD-10-CM | POA: Diagnosis present

## 2015-05-31 DIAGNOSIS — Z124 Encounter for screening for malignant neoplasm of cervix: Secondary | ICD-10-CM

## 2015-05-31 DIAGNOSIS — C189 Malignant neoplasm of colon, unspecified: Secondary | ICD-10-CM | POA: Diagnosis not present

## 2015-05-31 NOTE — Progress Notes (Signed)
   Subjective:    Patient ID: [name] [name], female    DOB: 1960/07/28, 55 y.o.   MRN: [id number]  HPI She is initially here to finish her exam with Pap. At the end of the encounter that she then mentioned getting an FMLA for continued difficulty with intermittent loose stools. It was difficult to get a good history from her but apparently this started after her diagnosis of colon cancer. She was followed by oncology after this with multiple colonoscopies but states she has had difficulty with bowel movements for several years. She was apparently given an FMLA most recently by Dr. Elinor Parkinson for after surgery for adhesions. She has seen Dr. Benson Norway on one occasion in the past.   Review of Systems     Objective:   Physical Exam Alert and in no distress. Pelvic exam does show the cervix to be friable and the os was difficult to evaluate. The uterus appeared normal in size. No adnexal masses.       Assessment & Plan:  Screening for cervical cancer - Plan: Cytology - PAP [place]  Screening for colon cancer - Plan: Ambulatory referral to Gastroenterology  Malignant neoplasm of colon, unspecified part of colon ([place]) I explained that I could not fill out an FMLA form based on her symptoms and it would be best to get follow-up with Dr. Benson Norway concerning this.

## 2015-06-02 ENCOUNTER — Telehealth: Payer: Self-pay | Admitting: Family Medicine

## 2015-06-02 ENCOUNTER — Encounter: Payer: Self-pay | Admitting: Gastroenterology

## 2015-06-02 LAB — CYTOLOGY - PAP

## 2015-06-02 NOTE — Telephone Encounter (Signed)
Pt ret a call to [place].  Nothing found.  Please call pt [phone]

## 2015-06-02 NOTE — Telephone Encounter (Signed)
I called and spoke to patient in reference to patient transferring from Dr. Minerva Areola care to [place]. I changed the referral in Epic for patient.

## 2015-06-02 NOTE — Addendum Note (Signed)
Addended by: Kyra Manges on: 06/02/2015 11:56 AM   Modules accepted: Orders

## 2015-06-09 ENCOUNTER — Ambulatory Visit: Payer: BLUE CROSS/BLUE SHIELD

## 2015-06-16 ENCOUNTER — Ambulatory Visit: Payer: BLUE CROSS/BLUE SHIELD

## 2015-06-23 ENCOUNTER — Ambulatory Visit: Payer: BLUE CROSS/BLUE SHIELD

## 2015-06-28 ENCOUNTER — Ambulatory Visit: Payer: BLUE CROSS/BLUE SHIELD

## 2015-07-12 ENCOUNTER — Ambulatory Visit (AMBULATORY_SURGERY_CENTER): Payer: Self-pay

## 2015-07-12 VITALS — Ht 60.0 in | Wt 197.2 lb

## 2015-07-12 DIAGNOSIS — Z85038 Personal history of other malignant neoplasm of large intestine: Secondary | ICD-10-CM

## 2015-07-12 NOTE — Progress Notes (Signed)
Per pt, no allergies to soy or egg products.Pt not taking any weight loss meds or using  O2 at home.  Pt was seen today for her pre-visit prior to her colonoscopy with Dr Havery Moros on 07/26/15.Pt has been having occasional rectal bleeding with mid to right sided abdominal pain.She has noticed changes in her bowels since hernia surgery last year. Scheduled  pt with Amy-Pa on 07/20/15 at 1:30pm to be evaluated.The colon on 07/26/15 was not cancelled at this time.Pt understood.She will call back if she has any problems or questions.

## 2015-07-20 ENCOUNTER — Other Ambulatory Visit (INDEPENDENT_AMBULATORY_CARE_PROVIDER_SITE_OTHER): Payer: BLUE CROSS/BLUE SHIELD

## 2015-07-20 ENCOUNTER — Ambulatory Visit (INDEPENDENT_AMBULATORY_CARE_PROVIDER_SITE_OTHER): Payer: BLUE CROSS/BLUE SHIELD | Admitting: Physician Assistant

## 2015-07-20 ENCOUNTER — Encounter: Payer: Self-pay | Admitting: Gastroenterology

## 2015-07-20 ENCOUNTER — Encounter: Payer: Self-pay | Admitting: Physician Assistant

## 2015-07-20 VITALS — BP 138/78 | HR 101 | Wt 198.0 lb

## 2015-07-20 DIAGNOSIS — Z85048 Personal history of other malignant neoplasm of rectum, rectosigmoid junction, and anus: Secondary | ICD-10-CM

## 2015-07-20 DIAGNOSIS — R198 Other specified symptoms and signs involving the digestive system and abdomen: Secondary | ICD-10-CM

## 2015-07-20 DIAGNOSIS — Z86010 Personal history of colonic polyps: Secondary | ICD-10-CM | POA: Diagnosis not present

## 2015-07-20 DIAGNOSIS — R140 Abdominal distension (gaseous): Secondary | ICD-10-CM | POA: Diagnosis not present

## 2015-07-20 DIAGNOSIS — R194 Change in bowel habit: Secondary | ICD-10-CM

## 2015-07-20 LAB — CBC WITH DIFFERENTIAL/PLATELET
BASOS ABS: 0 10*3/uL (ref 0.0–0.1)
Basophils Relative: 0.5 % (ref 0.0–3.0)
EOS ABS: 0.1 10*3/uL (ref 0.0–0.7)
Eosinophils Relative: 2 % (ref 0.0–5.0)
HEMATOCRIT: 37.9 % (ref 36.0–46.0)
Hemoglobin: 12.2 g/dL (ref 12.0–15.0)
LYMPHS PCT: 43.3 % (ref 12.0–46.0)
Lymphs Abs: 1.9 10*3/uL (ref 0.7–4.0)
MCHC: 32.2 g/dL (ref 30.0–36.0)
MCV: 69.9 fl — ABNORMAL LOW (ref 78.0–100.0)
Monocytes Absolute: 0.3 10*3/uL (ref 0.1–1.0)
Monocytes Relative: 7.3 % (ref 3.0–12.0)
NEUTROS ABS: 2.1 10*3/uL (ref 1.4–7.7)
NEUTROS PCT: 46.9 % (ref 43.0–77.0)
PLATELETS: 257 10*3/uL (ref 150.0–400.0)
RBC: 5.42 Mil/uL — ABNORMAL HIGH (ref 3.87–5.11)
RDW: 15.1 % (ref 11.5–15.5)
WBC: 4.5 10*3/uL (ref 4.0–10.5)

## 2015-07-20 LAB — IBC PANEL
Iron: 68 ug/dL (ref 42–145)
SATURATION RATIOS: 17.8 % — AB (ref 20.0–50.0)
TRANSFERRIN: 273 mg/dL (ref 212.0–360.0)

## 2015-07-20 LAB — FERRITIN: Ferritin: 85.2 ng/mL (ref 10.0–291.0)

## 2015-07-20 MED ORDER — BENEFIBER PO POWD: 1.0000 | Freq: Every day | ORAL | Status: DC

## 2015-07-20 MED ORDER — DICYCLOMINE HCL 10 MG PO CAPS: 10.0000 mg | ORAL_CAPSULE | ORAL | Status: DC

## 2015-07-20 MED ORDER — NA SULFATE-K SULFATE-MG SULF 17.5-3.13-1.6 GM/177ML PO SOLN: 1.0000 | Freq: Once | ORAL | Status: DC

## 2015-07-20 NOTE — Progress Notes (Signed)
Patient ID: [name] [name], female   DOB: [birth year], 55 y.o.   MRN: [id number]   Subjective:    Patient ID: [name] [name], female    DOB: [birth year], 55 y.o.   MRN: [id number]  HPI  [name] [name] is a 55 year old African-American female known previously to Dr. 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 1 of 19 lymph nodes were positive. She was treated with 5-FU, radiation and then took Xeloda short-term. He has not been on any chemotherapy since 2008. Last colonoscopy was done in May 2012 finding of 2 diminutive polyps in the ascending colon biopsy consistent with tubular adenoma she was recommended to have 5 year follow-up. Since that time she has undergone ventral hernia repair with mesh and And laparoscopic lysis of adhesions per Dr. Zella Richer in January 2016. Patient comes in today, referred by Dr. [name]. She says she has had alteration in her bowel habits ever since her original surgery but more so since she had the hernia repair and lysis of adhesions. Over the past couple of months she's been having much more frequent bowel movements and says she doesn't feel as if she completely evacuates her bowels when she does have a bowel movement. She says she has up to 9 bowel movements per day but will just pass small amounts of stool at a time. Sometimes this is associated with abdominal cramping and periodically she'll get bad episodes of abdominal cramping followed by rather explosive large volume stool which may be loose to diarrhea. Occasionally sees a small amount of bright red blood on the tissue when she's had a lot of bowel movements but otherwise has not seen blood. He tells bloated and distended most of the time. She has been able to identify identify some foods that seem to trigger the episodes of cramping and diarrhea including salad dressings. She has been consuming  artificial sweeteners and carbonated beverages on a daily basis. Most recent labs done in February 2017 CEA was 1.6 and hemoglobin 12.4 hematocrit 38.7 and MCV of 70.9  Review of Systems Pertinent positive and negative review of systems were noted in the above HPI section.  All other review of systems was otherwise negative.  Outpatient Encounter Prescriptions as of 07/20/2015  Medication Sig  . ibuprofen (ADVIL,MOTRIN) 200 MG tablet Take 400 mg by mouth every 6 (six) hours as needed (Pain). Reported on 07/12/2015  . dicyclomine (BENTYL) 10 MG capsule Take 1 capsule (10 mg total) by mouth every morning.  . Na Sulfate-K Sulfate-Mg Sulf SOLN Take 1 kit by mouth once.  . Wheat Dextrin (BENEFIBER) POWD Take 1 Dose by mouth daily.  . [DISCONTINUED] atorvastatin (LIPITOR) 20 MG tablet Take 1 tablet (20 mg total) by mouth daily. (Patient not taking: Reported on 07/12/2015)  . [DISCONTINUED] clarithromycin (BIAXIN) 500 MG tablet Take 1 tablet (500 mg total) by mouth 2 (two) times daily. (Patient not taking: Reported on 05/31/2015)  . [DISCONTINUED] glucose blood test strip Use as instructed  . [DISCONTINUED] lisinopril (PRINIVIL,ZESTRIL) 10 MG tablet Take 1 tablet (10 mg total) by mouth daily. (Patient not taking: Reported on 07/12/2015)   No facility-administered encounter medications on file as of 07/20/2015.   Allergies  Allergen Reactions  . Epinephrine Anaphylaxis  . Lidocaine Anaphylaxis   Patient Active Problem List   Diagnosis Date Noted  . Type 2 diabetes with complication ([place]) 44/92/0100  . Hyperlipidemia associated with type 2 diabetes  mellitus ([place]) 12/30/2012  . Obesity (BMI 30-39.9) 12/23/2012  . Colon cancer ([place]) 10/31/2011   Social History   Social History  . Marital Status: Single    Spouse Name: N/A  . Number of Children: N/A  . Years of Education: N/A   Occupational History  . Not on file.   Social History Main Topics  . Smoking status: Former Smoker -- 0.25 packs/day  for 13 years    Types: Cigarettes    Quit date: 03/14/2003  . Smokeless tobacco: Never Used  . Alcohol Use: No     Comment: occasional   . Drug Use: No  . Sexual Activity: Not Currently   Other Topics Concern  . Not on file   Social History Narrative    [name] [name]'s family history includes Colon cancer in her maternal aunt and maternal grandmother.      Objective:    Filed Vitals:   07/20/15 1345  BP: 138/78  Pulse: 101    Physical Exam  well-developed African-American female in no acute distress, pleasant blood pressure 138/78 pulse 101 , weight 198 . HEENT; nontraumatic,norm cephalic EOMI PERRLA, Sclera anicteric, Cardiovascular; regular rate and rhythm with S1-S2 no murmur or gallop, Pulmonary; clear bilaterally Abdomen; obese protuberant soft no definite palpable ventral hernias she does have mild rather generalized tenderness in the midline incisional scar, bowel sounds are present, no palpable mass or hepatosplenomegaly, Rectal exam not done today, Ext; no clubbing cyanosis or edema skin warm and dry, Neuropsych; mood and affect appropriate     Assessment & Plan:   #1 55 yo female with history of rectal adenocarcinoma diagnosed in 2007 status post low anterior resection. 1 of 19 nodes positive. She had adjuvant chemotherapy and radiation last treated in 2008. Last colonoscopy May 2012 with finding of 2 diminutive polyps both tubular adenomas she was recommended for 5 year interval follow-up. #2 status post laparoscopic lysis of adhesions and ventral hernia repairs with mesh January 2016 #3 ongoing issues with abdominal bloating and distention and alteration in bowel habits with very frequent small volume stools and intermittent episodes of severe abdominal cramping followed by explosive bowel movements. Rule out anastamotic  Stricture, IBS  Plan; Patient will be scheduled for colonoscopy with Dr. Havery Moros. Procedure discussed in detail with patient including risks and  benefits and she is agreeable to proceed. Check iron studies Start Benefiber once daily Start culturelle/probiotic once daily Start Bentyl 10 mg by mouth every morning Low gas diet Further plans pending results of colonoscopy   Armoni Kludt Genia Harold PA-C 07/20/2015   Cc: Denita Lung, MD

## 2015-07-20 NOTE — Progress Notes (Signed)
Agree with assessment and plan as outlined.  

## 2015-07-20 NOTE — Patient Instructions (Addendum)
Your physician has requested that you go to the basement for lab work before leaving today   We have sent the following medications to your pharmacy for you to pick up at your convenience:  Bentyl  Take one Culturelle daily  Take Benefiber daily in water or juice   You have been scheduled for a colonoscopy. Please follow written instructions given to you at your visit today.  Please pick up your prep supplies at the pharmacy within the next 1-3 days. If you use inhalers (even only as needed), please bring them with you on the day of your procedure.

## 2015-07-26 ENCOUNTER — Ambulatory Visit (AMBULATORY_SURGERY_CENTER): Payer: BLUE CROSS/BLUE SHIELD | Admitting: Gastroenterology

## 2015-07-26 ENCOUNTER — Encounter: Payer: Self-pay | Admitting: Gastroenterology

## 2015-07-26 VITALS — BP 128/77 | HR 76 | Temp 98.4°F | Resp 14 | Ht 60.0 in | Wt 197.0 lb

## 2015-07-26 DIAGNOSIS — Z85038 Personal history of other malignant neoplasm of large intestine: Secondary | ICD-10-CM

## 2015-07-26 DIAGNOSIS — D123 Benign neoplasm of transverse colon: Secondary | ICD-10-CM | POA: Diagnosis not present

## 2015-07-26 DIAGNOSIS — Z85040 Personal history of malignant carcinoid tumor of rectum: Secondary | ICD-10-CM

## 2015-07-26 DIAGNOSIS — D122 Benign neoplasm of ascending colon: Secondary | ICD-10-CM | POA: Diagnosis not present

## 2015-07-26 MED ORDER — SODIUM CHLORIDE 0.9 % IV SOLN: 500.0000 mL | INTRAVENOUS | Status: DC

## 2015-07-26 NOTE — Patient Instructions (Signed)
Discharge instructions given. Handouts on polyps,diverticulosis and hemorrhoids. Resume previous medications. No aspirin or anything containing aspirin for the next 2 weeks. YOU HAD AN ENDOSCOPIC PROCEDURE TODAY AT [facility]:   Refer to the procedure report that was given to you for any specific questions about what was found during the examination.  If the procedure report does not answer your questions, please call your gastroenterologist to clarify.  If you requested that your care partner not be given the details of your procedure findings, then the procedure report has been included in a sealed envelope for you to review at your convenience later.  YOU SHOULD EXPECT: Some feelings of bloating in the abdomen. Passage of more gas than usual.  Walking can help get rid of the air that was put into your GI tract during the procedure and reduce the bloating. If you had a lower endoscopy (such as a colonoscopy or flexible sigmoidoscopy) you may notice spotting of blood in your stool or on the toilet paper. If you underwent a bowel prep for your procedure, you may not have a normal bowel movement for a few days.  Please Note:  You might notice some irritation and congestion in your nose or some drainage.  This is from the oxygen used during your procedure.  There is no need for concern and it should clear up in a day or so.  SYMPTOMS TO REPORT IMMEDIATELY:   Following lower endoscopy (colonoscopy or flexible sigmoidoscopy):  Excessive amounts of blood in the stool  Significant tenderness or worsening of abdominal pains  Swelling of the abdomen that is new, acute  Fever of 100F or higher   For urgent or emergent issues, a gastroenterologist can be reached at any hour by calling [phone].   DIET: Your first meal following the procedure should be a small meal and then it is ok to progress to your normal diet. Heavy or fried foods are harder to digest and may make you feel  nauseous or bloated.  Likewise, meals heavy in dairy and vegetables can increase bloating.  Drink plenty of fluids but you should avoid alcoholic beverages for 24 hours.  ACTIVITY:  You should plan to take it easy for the rest of today and you should NOT DRIVE or use heavy machinery until tomorrow (because of the sedation medicines used during the test).    FOLLOW UP: Our staff will call the number listed on your records the next business day following your procedure to check on you and address any questions or concerns that you may have regarding the information given to you following your procedure. If we do not reach you, we will leave a message.  However, if you are feeling well and you are not experiencing any problems, there is no need to return our call.  We will assume that you have returned to your regular daily activities without incident.  If any biopsies were taken you will be contacted by phone or by letter within the next 1-3 weeks.  Please call us at [phone] if you have not heard about the biopsies in 3 weeks.    SIGNATURES/CONFIDENTIALITY: You and/or your care partner have signed paperwork which will be entered into your electronic medical record.  These signatures attest to the fact that that the information above on your After Visit Summary has been reviewed and is understood.  Full responsibility of the confidentiality of this discharge information lies with you and/or your care-partner.

## 2015-07-26 NOTE — Progress Notes (Signed)
Patient awakening,vss,report to rn 

## 2015-07-26 NOTE — Op Note (Signed)
[place] Patient Name: [name] [name] Procedure Date: 07/26/2015 9:11 AM MRN: [id number] Endoscopist: Remo Lipps P. Havery Moros , MD Age: 55 Referring MD:  Date of Birth: 01/24/1961 Gender: Female Procedure:                Colonoscopy Indications:              High risk colon cancer surveillance: Personal                            history of rectal cancer. Last colonoscopy 5 years                            ago Medicines:                Monitored Anesthesia Care Procedure:                Pre-Anesthesia Assessment:                           - Prior to the procedure, a History and Physical                            was performed, and patient medications and                            allergies were reviewed. The patient's tolerance of                            previous anesthesia was also reviewed. The risks                            and benefits of the procedure and the sedation                            options and risks were discussed with the patient.                            All questions were answered, and informed consent                            was obtained. Prior Anticoagulants: The patient has                            taken no previous anticoagulant or antiplatelet                            agents. ASA Grade Assessment: II - A patient with                            mild systemic disease. After reviewing the risks                            and benefits, the patient was deemed in  satisfactory condition to undergo the procedure.                           After obtaining informed consent, the colonoscope                            was passed under direct vision. Throughout the                            procedure, the patient's blood pressure, pulse, and                            oxygen saturations were monitored continuously. The                            Model PCF-H190L [id number]) scope was introduced       through the anus and advanced to the the cecum,                            identified by appendiceal orifice and ileocecal                            valve. The colonoscopy was performed without                            difficulty. The patient tolerated the procedure                            well. The quality of the bowel preparation was                            good. The ileocecal valve, appendiceal orifice, and                            rectum were photographed. Scope In: 9:22:07 AM Scope Out: 9:34:17 AM Scope Withdrawal Time: 0 hours 11 minutes 15 seconds  Total Procedure Duration: 0 hours 12 minutes 10 seconds  Findings:                 The perianal and digital rectal examinations were                            normal.                           There was evidence of a prior end-to-end                            colo-colonic anastomosis in the rectum. This was                            patent and was characterized by healthy appearing                            mucosa.  A 4 mm polyp was found in the ascending colon. The                            polyp was sessile. The polyp was removed with a                            cold snare. Resection and retrieval were complete.                           A 5 mm polyp was found in the hepatic flexure. The                            polyp was sessile. The polyp was removed with a                            cold snare. Resection and retrieval were complete.                           A few medium-mouthed diverticula were found in the                            left colon.                           Non-bleeding internal hemorrhoids were found.                           The exam was otherwise without abnormality.                            Retroflexion was not performed given the narrow                            rectal vault. Complications:            No immediate complications. Estimated blood loss:                             Minimal. Estimated Blood Loss:     Estimated blood loss was minimal. Impression:               - Patent end-to-end colo-colonic anastomosis,                            characterized by healthy appearing mucosa.                           - One 4 mm polyp in the ascending colon, removed                            with a cold snare. Resected and retrieved.                           - One 5 mm polyp at the hepatic flexure, removed  with a cold snare. Resected and retrieved.                           - Diverticulosis in the left colon.                           - Non-bleeding internal hemorrhoids.                           - The examination was otherwise normal. Recommendation:           - Patient has a contact number available for                            emergencies. The signs and symptoms of potential                            delayed complications were discussed with the                            patient. Return to normal activities tomorrow.                            Written discharge instructions were provided to the                            patient.                           - Resume previous diet.                           - Continue present medications.                           - No aspirin, ibuprofen, naproxen, or other                            non-steroidal anti-inflammatory drugs for 2 weeks                            after polyp removal.                           - Await pathology results.                           - Repeat colonoscopy in 5 years for surveillance                            regardless of pathology results. Remo Lipps P. Armbruster, MD 07/26/2015 9:39:16 AM This report has been signed electronically.

## 2015-07-26 NOTE — Progress Notes (Signed)
Called to room to assist during endoscopic procedure.  Patient ID and intended procedure confirmed with present staff. Received instructions for my participation in the procedure from the performing physician.  

## 2015-07-27 ENCOUNTER — Telehealth: Payer: Self-pay | Admitting: *Deleted

## 2015-07-27 NOTE — Telephone Encounter (Signed)
  Follow up Call-  Call back number 07/26/2015  Post procedure Call Back phone  # [phone]  Permission to leave phone message Yes     Patient questions:  Do you have a fever, pain , or abdominal swelling? No. Pain Score  0 *  Have you tolerated food without any problems? Yes.    Have you been able to return to your normal activities? Yes.    Do you have any questions about your discharge instructions: Diet   No. Medications  No. Follow up visit  No.  Do you have questions or concerns about your Care? No.  Actions: * If pain score is 4 or above: No action needed, pain <4.

## 2015-07-29 ENCOUNTER — Encounter: Payer: Self-pay | Admitting: Gastroenterology

## 2015-08-29 ENCOUNTER — Telehealth: Payer: Self-pay | Admitting: Family Medicine

## 2015-08-29 MED ORDER — FLUCONAZOLE 150 MG PO TABS: 150.0000 mg | ORAL_TABLET | Freq: Once | ORAL | Status: DC

## 2015-08-29 NOTE — Telephone Encounter (Signed)
i called a med in

## 2015-08-29 NOTE — Telephone Encounter (Signed)
Patient called re rx for yeast infection  She states she has yeast infection from antiobiotics you prescribed. She has tried otc and not helping  Please call patient   [facility]

## 2015-08-30 ENCOUNTER — Telehealth: Payer: Self-pay | Admitting: Family Medicine

## 2015-08-30 NOTE — Telephone Encounter (Signed)
Pt called to check on request for Diflucan,  Advised was sent in error to mail order.  I called it into [facility].  Called [facility] and cancelled

## 2015-08-30 NOTE — Telephone Encounter (Signed)
Pt informed

## 2015-09-01 ENCOUNTER — Inpatient Hospital Stay (HOSPITAL_COMMUNITY)
Admission: AD | Admit: 2015-09-01 | Discharge: 2015-09-01 | Disposition: A | Discharge status: Home / Self Care | Payer: BLUE CROSS/BLUE SHIELD | Source: Ambulatory Visit | Attending: Family Medicine | Admitting: Family Medicine

## 2015-09-01 ENCOUNTER — Encounter (HOSPITAL_COMMUNITY): Payer: Self-pay | Admitting: *Deleted

## 2015-09-01 DIAGNOSIS — E1169 Type 2 diabetes mellitus with other specified complication: Secondary | ICD-10-CM

## 2015-09-01 DIAGNOSIS — R739 Hyperglycemia, unspecified: Secondary | ICD-10-CM | POA: Diagnosis not present

## 2015-09-01 DIAGNOSIS — E118 Type 2 diabetes mellitus with unspecified complications: Secondary | ICD-10-CM

## 2015-09-01 DIAGNOSIS — E785 Hyperlipidemia, unspecified: Secondary | ICD-10-CM | POA: Insufficient documentation

## 2015-09-01 DIAGNOSIS — Z85038 Personal history of other malignant neoplasm of large intestine: Secondary | ICD-10-CM | POA: Insufficient documentation

## 2015-09-01 DIAGNOSIS — E119 Type 2 diabetes mellitus without complications: Secondary | ICD-10-CM | POA: Diagnosis present

## 2015-09-01 DIAGNOSIS — E1165 Type 2 diabetes mellitus with hyperglycemia: Secondary | ICD-10-CM | POA: Diagnosis not present

## 2015-09-01 DIAGNOSIS — Z87891 Personal history of nicotine dependence: Secondary | ICD-10-CM | POA: Insufficient documentation

## 2015-09-01 HISTORY — DX: Other complications of anesthesia, initial encounter: T88.59XA

## 2015-09-01 HISTORY — DX: Adverse effect of unspecified anesthetic, initial encounter: T41.45XA

## 2015-09-01 LAB — COMPREHENSIVE METABOLIC PANEL
ALK PHOS: 86 U/L (ref 38–126)
ALT: 26 U/L (ref 14–54)
ANION GAP: 11 (ref 5–15)
AST: 33 U/L (ref 15–41)
Albumin: 4.2 g/dL (ref 3.5–5.0)
BILIRUBIN TOTAL: 0.3 mg/dL (ref 0.3–1.2)
BUN: 14 mg/dL (ref 6–20)
CALCIUM: 9 mg/dL (ref 8.9–10.3)
CO2: 25 mmol/L (ref 22–32)
Chloride: 98 mmol/L — ABNORMAL LOW (ref 101–111)
Creatinine, Ser: 0.86 mg/dL (ref 0.44–1.00)
Glucose, Bld: 355 mg/dL — ABNORMAL HIGH (ref 65–99)
Potassium: 3.9 mmol/L (ref 3.5–5.1)
Sodium: 134 mmol/L — ABNORMAL LOW (ref 135–145)
TOTAL PROTEIN: 7.6 g/dL (ref 6.5–8.1)

## 2015-09-01 LAB — CBC WITH DIFFERENTIAL/PLATELET
BASOS ABS: 0 10*3/uL (ref 0.0–0.1)
Basophils Relative: 0 %
EOS ABS: 0.1 10*3/uL (ref 0.0–0.7)
Eosinophils Relative: 2 %
HCT: 37.4 % (ref 36.0–46.0)
Hemoglobin: 12.3 g/dL (ref 12.0–15.0)
LYMPHS ABS: 1.7 10*3/uL (ref 0.7–4.0)
LYMPHS PCT: 52 %
MCH: 22.2 pg — AB (ref 26.0–34.0)
MCHC: 32.9 g/dL (ref 30.0–36.0)
MCV: 67.6 fL — ABNORMAL LOW (ref 78.0–100.0)
MONO ABS: 0.2 10*3/uL (ref 0.1–1.0)
Monocytes Relative: 6 %
NEUTROS PCT: 40 %
Neutro Abs: 1.4 10*3/uL — ABNORMAL LOW (ref 1.7–7.7)
Other: 0 %
PLATELETS: 189 10*3/uL (ref 150–400)
RBC: 5.53 MIL/uL — ABNORMAL HIGH (ref 3.87–5.11)
RDW: 14 % (ref 11.5–15.5)
WBC: 3.4 10*3/uL — ABNORMAL LOW (ref 4.0–10.5)

## 2015-09-01 LAB — URINALYSIS, ROUTINE W REFLEX MICROSCOPIC
BILIRUBIN URINE: NEGATIVE
Ketones, ur: 15 mg/dL — AB
Leukocytes, UA: NEGATIVE
Nitrite: NEGATIVE
PH: 5.5 (ref 5.0–8.0)
Protein, ur: NEGATIVE mg/dL
SPECIFIC GRAVITY, URINE: 1.01 (ref 1.005–1.030)

## 2015-09-01 LAB — GLUCOSE, CAPILLARY: GLUCOSE-CAPILLARY: 359 mg/dL — AB (ref 65–99)

## 2015-09-01 LAB — URINE MICROSCOPIC-ADD ON

## 2015-09-01 MED ORDER — INSULIN REGULAR HUMAN 100 UNIT/ML IJ SOLN
10.0000 [IU] | Freq: Once | INTRAMUSCULAR | Status: AC
  Administered 2015-09-01: 10 [IU] via SUBCUTANEOUS
  Filled 2015-09-01: qty 1

## 2015-09-01 MED ORDER — ONETOUCH ULTRASOFT LANCETS MISC: Status: DC

## 2015-09-01 MED ORDER — METFORMIN HCL 500 MG PO TABS: 500.0000 mg | ORAL_TABLET | Freq: Two times a day (BID) | ORAL | Status: DC

## 2015-09-01 MED ORDER — ATORVASTATIN CALCIUM 20 MG PO TABS: 20.0000 mg | ORAL_TABLET | Freq: Every day | ORAL | Status: DC

## 2015-09-01 MED ORDER — GLUCOSE BLOOD VI STRP: ORAL_STRIP | Status: DC

## 2015-09-01 MED FILL — metFORMIN HCL 500 MG TABS: 500 | 30 days supply | Qty: 60 | Fill #0

## 2015-09-01 MED FILL — ATORVASTATIN 20 MG TABLET: 20 | 30 days supply | Qty: 30 | Fill #0

## 2015-09-01 MED FILL — ONE TOUCH ULTRA TEST STRIPS: 17 days supply | Qty: 50 | Fill #0

## 2015-09-01 NOTE — Discharge Instructions (Signed)
Blood Glucose Monitoring, Adult Monitoring your blood glucose (also know as blood sugar) helps you to manage your diabetes. It also helps you and your health care provider monitor your diabetes and determine how well your treatment plan is working. WHY SHOULD YOU MONITOR YOUR BLOOD GLUCOSE?  It can help you understand how food, exercise, and medicine affect your blood glucose.  It allows you to know what your blood glucose is at any given moment. You can quickly tell if you are having low blood glucose (hypoglycemia) or high blood glucose (hyperglycemia).  It can help you and your health care provider know how to adjust your medicines.  It can help you understand how to manage an illness or adjust medicine for exercise. WHEN SHOULD YOU TEST? Your health care provider will help you decide how often you should check your blood glucose. This may depend on the type of diabetes you have, your diabetes control, or the types of medicines you are taking. Be sure to write down all of your blood glucose readings so that this information can be reviewed with your health care provider. See below for examples of testing times that your health care provider may suggest. Type 1 Diabetes  Test at least 2 times per day if your diabetes is well controlled, if you are using an insulin pump, or if you perform multiple daily injections.  If your diabetes is not well controlled or if you are sick, you may need to test more often.  It is a good idea to also test:  Before every insulin injection.  Before and after exercise.  Between meals and 2 hours after a meal.  Occasionally between 2:00 a.m. and 3:00 a.m. Type 2 Diabetes  If you are taking insulin, test at least 2 times per day. However, it is best to test before every insulin injection.  If you take medicines by mouth (orally), test 2 times a day.  If you are on a controlled diet, test once a day.  If your diabetes is not well controlled or if you  are sick, you may need to monitor more often. HOW TO MONITOR YOUR BLOOD GLUCOSE Supplies Needed  Blood glucose meter.  Test strips for your meter. Each meter has its own strips. You must use the strips that go with your own meter.  A pricking needle (lancet).  A device that holds the lancet (lancing device).  A journal or log book to write down your results. Procedure  Wash your hands with soap and water. Alcohol is not preferred.  Prick the side of your finger (not the tip) with the lancet.  Gently milk the finger until a small drop of blood appears.  Follow the instructions that come with your meter for inserting the test strip, applying blood to the strip, and using your blood glucose meter. Other Areas to Get Blood for Testing Some meters allow you to use other areas of your body (other than your finger) to test your blood. These areas are called alternative sites. The most common alternative sites are:  The forearm.  The thigh.  The back area of the lower leg.  The palm of the hand. The blood flow in these areas is slower. Therefore, the blood glucose values you get may be delayed, and the numbers are different from what you would get from your fingers. Do not use alternative sites if you think you are having hypoglycemia. Your reading will not be accurate. Always use a finger if you are  having hypoglycemia. Also, if you cannot feel your lows (hypoglycemia unawareness), always use your fingers for your blood glucose checks. ADDITIONAL TIPS FOR GLUCOSE MONITORING  Do not reuse lancets.  Always carry your supplies with you.  All blood glucose meters have a 24-hour "hotline" number to call if you have questions or need help.  Adjust (calibrate) your blood glucose meter with a control solution after finishing a few boxes of strips. BLOOD GLUCOSE RECORD KEEPING It is a good idea to keep a daily record or log of your blood glucose readings. Most glucose meters, if not all,  keep your glucose records stored in the meter. Some meters come with the ability to download your records to your home computer. Keeping a record of your blood glucose readings is especially helpful if you are wanting to look for patterns. Make notes to go along with the blood glucose readings because you might forget what happened at that exact time. Keeping good records helps you and your health care provider to work together to achieve good diabetes management.    This information is not intended to replace advice given to you by your health care provider. Make sure you discuss any questions you have with your health care provider.   Document Released: 02/22/2003 Document Revised: 03/12/2014 Document Reviewed: 07/14/2012 Elsevier Interactive Patient Education 2016 Elsevier Inc.  Cholesterol Cholesterol is a white, waxy, fat-like substance needed by your body in small amounts. The liver makes all the cholesterol you need. Cholesterol is carried from the liver by the blood through the blood vessels. Deposits of cholesterol (plaque) may build up on blood vessel walls. These make the arteries narrower and stiffer. Cholesterol plaques increase the risk for heart attack and stroke.  You cannot feel your cholesterol level even if it is very high. The only way to know it is high is with a blood test. Once you know your cholesterol levels, you should keep a record of the test results. Work with your health care provider to keep your levels in the desired range.  WHAT DO THE RESULTS MEAN?  Total cholesterol is a rough measure of all the cholesterol in your blood.   LDL is the so-called bad cholesterol. This is the type that deposits cholesterol in the walls of the arteries. You want this level to be low.   HDL is the good cholesterol because it cleans the arteries and carries the LDL away. You want this level to be high.  Triglycerides are fat that the body can either burn for energy or store. High  levels are closely linked to heart disease.  WHAT ARE THE DESIRED LEVELS OF CHOLESTEROL?  Total cholesterol below 200.   LDL below 100 for people at risk, below 70 for those at very high risk.   HDL above 50 is good, above 60 is best.   Triglycerides below 150.  HOW CAN I LOWER MY CHOLESTEROL?  Diet. Follow your diet programs as directed by your health care provider.   Choose fish or white meat chicken and [place], roasted or baked. Limit fatty cuts of red meat, fried foods, and processed meats, such as sausage and lunch meats.   Eat lots of fresh fruits and vegetables.  Choose whole grains, beans, pasta, potatoes, and cereals.   Use only small amounts of olive, corn, or canola oils.   Avoid butter, mayonnaise, shortening, or palm kernel oils.  Avoid foods with trans fats.   Drink skim or nonfat milk and eat low-fat or nonfat  yogurt and cheeses. Avoid whole milk, cream, ice cream, egg yolks, and full-fat cheeses.   Healthy desserts include angel food cake, ginger snaps, animal crackers, hard candy, popsicles, and low-fat or nonfat frozen yogurt. Avoid pastries, cakes, pies, and cookies.   Exercise. Follow your exercise programs as directed by your health care provider.   A regular program helps decrease LDL and raise HDL.   A regular program helps with weight control.   Do things that increase your activity level like gardening, walking, or taking the stairs. Ask your health care provider about how you can be more active in your daily life.   Medicine. Take medicine only as directed by your health care provider.   Medicine may be prescribed by your health care provider to help lower cholesterol and decrease the risk for heart disease.   If you have several risk factors, you may need medicine even if your levels are normal.   This information is not intended to replace advice given to you by your health care provider. Make sure you discuss any questions you  have with your health care provider.   Document Released: 11/14/2000 Document Revised: 03/12/2014 Document Reviewed: 12/03/2012 Elsevier Interactive Patient Education 2016 Elsevier Inc. Fat and Cholesterol Restricted Diet High levels of fat and cholesterol in your blood may lead to various health problems, such as diseases of the heart, blood vessels, gallbladder, liver, and pancreas. Fats are concentrated sources of energy that come in various forms. Certain types of fat, including saturated fat, may be harmful in excess. Cholesterol is a substance needed by your body in small amounts. Your body makes all the cholesterol it needs. Excess cholesterol comes from the food you eat. When you have high levels of cholesterol and saturated fat in your blood, health problems can develop because the excess fat and cholesterol will gather along the walls of your blood vessels, causing them to narrow. Choosing the right foods will help you control your intake of fat and cholesterol. This will help keep the levels of these substances in your blood within normal limits and reduce your risk of disease. WHAT IS MY PLAN? Your health care provider recommends that you:  Get no more than __________ % of the total calories in your daily diet from fat.  Limit your intake of saturated fat to less than ______% of your total calories each day.  Limit the amount of cholesterol in your diet to less than _________mg per day. WHAT TYPES OF FAT SHOULD I CHOOSE?  Choose healthy fats more often. Choose monounsaturated and polyunsaturated fats, such as olive and canola oil, flaxseeds, walnuts, almonds, and seeds.  Eat more omega-3 fats. Good choices include salmon, mackerel, sardines, tuna, flaxseed oil, and ground flaxseeds. Aim to eat fish at least two times a week.  Limit saturated fats. Saturated fats are primarily found in animal products, such as meats, butter, and cream. Plant sources of saturated fats include palm  oil, palm kernel oil, and coconut oil.  Avoid foods with partially hydrogenated oils in them. These contain trans fats. Examples of foods that contain trans fats are stick margarine, some tub margarines, cookies, crackers, and other baked goods. WHAT GENERAL GUIDELINES DO I NEED TO FOLLOW? These guidelines for healthy eating will help you control your intake of fat and cholesterol:  Check food labels carefully to identify foods with trans fats or high amounts of saturated fat.  Fill one half of your plate with vegetables and green salads.  Fill one fourth  of your plate with whole grains. Look for the word "whole" as the first word in the ingredient list.  Fill one fourth of your plate with lean protein foods.  Limit fruit to two servings a day. Choose fruit instead of juice.  Eat more foods that contain soluble fiber. Examples of foods that contain this type of fiber are apples, broccoli, carrots, beans, peas, and barley. Aim to get 20-30 g of fiber per day.  Eat more home-cooked food and less restaurant, buffet, and fast food.  Limit or avoid alcohol.  Limit foods high in starch and sugar.  Limit fried foods.  Cook foods using methods other than frying. Baking, boiling, grilling, and broiling are all great options.  Lose weight if you are overweight. Losing just 5-10% of your initial body weight can help your overall health and prevent diseases such as diabetes and heart disease. WHAT FOODS CAN I EAT? Grains Whole grains, such as whole wheat or whole grain breads, crackers, cereals, and pasta. Unsweetened oatmeal, bulgur, barley, quinoa, or brown rice. Corn or whole wheat flour tortillas. Vegetables Fresh or frozen vegetables (raw, steamed, roasted, or grilled). Green salads. Fruits All fresh, canned (in natural juice), or frozen fruits. Meat and Other Protein Products Ground beef (85% or leaner), grass-fed beef, or beef trimmed of fat. Skinless chicken or [place]. Ground chicken  or [place]. Pork trimmed of fat. All fish and seafood. Eggs. Dried beans, peas, or lentils. Unsalted nuts or seeds. Unsalted canned or dry beans. Dairy Low-fat dairy products, such as skim or 1% milk, 2% or reduced-fat cheeses, low-fat ricotta or cottage cheese, or plain low-fat yogurt. Fats and Oils Tub margarines without trans fats. Light or reduced-fat mayonnaise and salad dressings. Avocado. Olive, canola, sesame, or safflower oils. Natural peanut or almond butter (choose ones without added sugar and oil). The items listed above may not be a complete list of recommended foods or beverages. Contact your dietitian for more options. WHAT FOODS ARE NOT RECOMMENDED? Grains White bread. White pasta. White rice. Cornbread. Bagels, pastries, and croissants. Crackers that contain trans fat. Vegetables White potatoes. Corn. Creamed or fried vegetables. Vegetables in a cheese sauce. Fruits Dried fruits. Canned fruit in light or heavy syrup. Fruit juice. Meat and Other Protein Products Fatty cuts of meat. Ribs, chicken wings, bacon, sausage, bologna, salami, chitterlings, fatback, hot dogs, bratwurst, and packaged luncheon meats. Liver and organ meats. Dairy Whole or 2% milk, cream, half-and-half, and cream cheese. Whole milk cheeses. Whole-fat or sweetened yogurt. Full-fat cheeses. Nondairy creamers and whipped toppings. Processed cheese, cheese spreads, or cheese curds. Sweets and Desserts Corn syrup, sugars, honey, and molasses. Candy. Jam and jelly. Syrup. Sweetened cereals. Cookies, pies, cakes, donuts, muffins, and ice cream. Fats and Oils Butter, stick margarine, lard, shortening, ghee, or bacon fat. Coconut, palm kernel, or palm oils. Beverages Alcohol. Sweetened drinks (such as sodas, lemonade, and fruit drinks or punches). The items listed above may not be a complete list of foods and beverages to avoid. Contact your dietitian for more information.   This information is not intended to  replace advice given to you by your health care provider. Make sure you discuss any questions you have with your health care provider.   Document Released: 02/19/2005 Document Revised: 03/12/2014 Document Reviewed: 05/20/2013 Elsevier Interactive Patient Education [employer].

## 2015-09-01 NOTE — MAU Note (Addendum)
Pt reports  her blood sugar is over 400. She hasa monitor at home not checked her sugar in over a year because her doctor  not tell her to do so after her check up last year. She has been feeling very thirsty all week and just had her cousin check her sugar last night and it was 400. Pt state she had diarrhea on Monday ( has it from time to time due to her Hx of Colon CA)

## 2015-09-01 NOTE — MAU Provider Note (Signed)
History     CSN: [id number]  Arrival date and time: 09/01/15 6195   First Provider Initiated Contact with Patient 09/01/15 1023      Chief Complaint  Patient presents with  . Blood Sugar Problem   HPI  [name] is a 55 y.o. female [phone] was told by PCP that she has the beginning stages of DM, she never followed up with her Dr. After that because he never mentioned it again at her visits. She was checking her BS at home up until last year and then she stopped because her Dr. Janice Coffin mentioned it again.  When she ran out of her test strips she stopped checking it. She started feeling really thirsty last week and she checked her BS at home at it read "high".  She checked it again today and it said 495.  Her primary care DR. Told her she didn't need to take medication because this is the early stages of DM.   She see's Dr. Viona Gilmore for primary care.    OB History    Gravida Para Term Preterm AB TAB SAB Ectopic Multiple Living   '5 3 3  2     3      ' Past Medical History  Diagnosis Date  . Cancer ([place])     rectal ca  . Colon cancer ([place])   . Headache   . Anemia   . Abdominal hernia     4 hernias per pt  . Change in bowel movement   . History of rectal bleeding   . Abdominal pain     mid abd pain radiating to right side  . Complication of anesthesia     cardiac arrest during nasal surgery    Past Surgical History  Procedure Laterality Date  . Colon surgery    . Colonoscopy    . Polypectomy    . Colon cancer    . Cesarean section    . Ventral hernia repair N/A 04/02/2014    Procedure: LAPAROSCOPIC  LYSIS OF ADHESIONS AND REPAIR OF VENTRAL INCISIONAL HERNIAS WITH MESH;  Surgeon: Jackolyn Confer, MD;  Location: [facility];  Service: General;  Laterality: N/A;  . Laparoscopic lysis of adhesions  04/02/2014    Procedure: LAPAROSCOPIC LYSIS OF ADHESIONS;  Surgeon: Jackolyn Confer, MD;  Location: [facility];  Service: General;;  . Nose surgery      Family History  Problem  Relation Age of Onset  . Colon cancer Maternal Grandmother   . Colon cancer Maternal Aunt     Social History  Substance Use Topics  . Smoking status: Former Smoker -- 0.25 packs/day for 13 years    Types: Cigarettes    Quit date: 03/14/2003  . Smokeless tobacco: Never Used  . Alcohol Use: No     Comment: occasional     Allergies:  Allergies  Allergen Reactions  . Epinephrine Anaphylaxis  . Lidocaine Anaphylaxis    No prescriptions prior to admission   Results for orders placed or performed during the hospital encounter of 09/01/15 (from the past 48 hour(s))  Urinalysis, Routine w reflex microscopic (not at [facility])     Status: Abnormal   Collection Time: 09/01/15  9:10 AM  Result Value Ref Range   Color, Urine YELLOW YELLOW   APPearance CLEAR CLEAR   Specific Gravity, Urine 1.010 1.005 - 1.030   pH 5.5 5.0 - 8.0   Glucose, UA >1000 (A) NEGATIVE mg/dL   Hgb urine dipstick SMALL (A) NEGATIVE   Bilirubin  Urine NEGATIVE NEGATIVE   Ketones, ur 15 (A) NEGATIVE mg/dL   Protein, ur NEGATIVE NEGATIVE mg/dL   Nitrite NEGATIVE NEGATIVE   Leukocytes, UA NEGATIVE NEGATIVE  Urine microscopic-add on     Status: Abnormal   Collection Time: 09/01/15  9:10 AM  Result Value Ref Range   Squamous Epithelial / LPF 0-5 (A) NONE SEEN   WBC, UA 0-5 0 - 5 WBC/hpf   RBC / HPF 0-5 0 - 5 RBC/hpf   Bacteria, UA RARE (A) NONE SEEN  Glucose, capillary     Status: Abnormal   Collection Time: 09/01/15  9:29 AM  Result Value Ref Range   Glucose-Capillary 359 (H) 65 - 99 mg/dL  Comprehensive metabolic panel     Status: Abnormal   Collection Time: 09/01/15  9:33 AM  Result Value Ref Range   Sodium 134 (L) 135 - 145 mmol/L   Potassium 3.9 3.5 - 5.1 mmol/L   Chloride 98 (L) 101 - 111 mmol/L   CO2 25 22 - 32 mmol/L   Glucose, Bld 355 (H) 65 - 99 mg/dL   BUN 14 6 - 20 mg/dL   Creatinine, Ser 0.86 0.44 - 1.00 mg/dL   Calcium 9.0 8.9 - 10.3 mg/dL   Total Protein 7.6 6.5 - 8.1 g/dL   Albumin 4.2 3.5 -  5.0 g/dL   AST 33 15 - 41 U/L   ALT 26 14 - 54 U/L   Alkaline Phosphatase 86 38 - 126 U/L   Total Bilirubin 0.3 0.3 - 1.2 mg/dL   GFR calc non Af Amer >60 >60 mL/min   GFR calc Af Amer >60 >60 mL/min    Comment: (NOTE) The eGFR has been calculated using the CKD EPI equation. This calculation has not been validated in all clinical situations. eGFR's persistently <60 mL/min signify possible Chronic Kidney Disease.    Anion gap 11 5 - 15  CBC with Differential     Status: Abnormal   Collection Time: 09/01/15  9:33 AM  Result Value Ref Range   WBC 3.4 (L) 4.0 - 10.5 K/uL   RBC 5.53 (H) 3.87 - 5.11 MIL/uL   Hemoglobin 12.3 12.0 - 15.0 g/dL   HCT 37.4 36.0 - 46.0 %   MCV 67.6 (L) 78.0 - 100.0 fL   MCH 22.2 (L) 26.0 - 34.0 pg   MCHC 32.9 30.0 - 36.0 g/dL   RDW 14.0 11.5 - 15.5 %   Platelets 189 150 - 400 K/uL   Neutrophils Relative % 40 %   Lymphocytes Relative 52 %   Monocytes Relative 6 %   Eosinophils Relative 2 %   Basophils Relative 0 %   Other 0 %   Neutro Abs 1.4 (L) 1.7 - 7.7 K/uL   Lymphs Abs 1.7 0.7 - 4.0 K/uL   Monocytes Absolute 0.2 0.1 - 1.0 K/uL   Eosinophils Absolute 0.1 0.0 - 0.7 K/uL   Basophils Absolute 0.0 0.0 - 0.1 K/uL   Smear Review MORPHOLOGY UNREMARKABLE     Review of Systems  Eyes: Positive for blurred vision (occasional. ).  Gastrointestinal: Negative for nausea, vomiting and abdominal pain.   Physical Exam   Blood pressure 130/82, pulse 75, temperature 97.8 F (36.6 C), temperature source Oral, resp. rate 18, height 5' (1.524 m), weight 189 lb 6.4 oz (85.911 kg).  Physical Exam  Constitutional: She is oriented to person, place, and time. She appears well-developed and well-nourished. No distress.  HENT:  Head: Normocephalic.  Eyes: Pupils are equal,  round, and reactive to light.  Neck: Neck supple.  Cardiovascular: Normal rate.   Respiratory: Effort normal.  Musculoskeletal: Normal range of motion.  Neurological: She is alert and oriented  to person, place, and time.  Skin: Skin is warm. She is not diaphoretic.  Psychiatric: Her behavior is normal.    MAU Course  Procedures  None  MDM  UA. CBC CMP CBG A1c- pending  Regular insulin 10 mg sub Q given now Discussed patient with Dr. Nehemiah Settle.    Assessment and Plan   A:  1. Elevated blood sugar level   2. Type 2 diabetes mellitus with complication, without long-term current use of insulin ([place])   3. Hyperlipidemia associated with type 2 diabetes mellitus ([place])     P:  Discharge home in stable condition Patient to follow up with [facility] Internal Medicine office; call to make an appointment Rx: Metformin, Refill on statin, refill on DM supplies  If symptoms worsen, patient to go to [facility] ED Discussed diet Discussed the importance of taking medication as it is prescribed.   Lezlie Lye, NP 09/01/2015 1:30 PM

## 2015-09-02 LAB — HEMOGLOBIN A1C
Hgb A1c MFr Bld: 11.8 % — ABNORMAL HIGH (ref 4.8–5.6)
MEAN PLASMA GLUCOSE: 292 mg/dL

## 2015-09-05 ENCOUNTER — Emergency Department (HOSPITAL_COMMUNITY)
Admission: EM | Admit: 2015-09-05 | Discharge: 2015-09-05 | Disposition: A | Discharge status: Home / Self Care | Payer: BLUE CROSS/BLUE SHIELD | Attending: Emergency Medicine | Admitting: Emergency Medicine

## 2015-09-05 ENCOUNTER — Encounter (HOSPITAL_COMMUNITY): Payer: Self-pay | Admitting: Emergency Medicine

## 2015-09-05 DIAGNOSIS — Z85048 Personal history of other malignant neoplasm of rectum, rectosigmoid junction, and anus: Secondary | ICD-10-CM | POA: Insufficient documentation

## 2015-09-05 DIAGNOSIS — Z79899 Other long term (current) drug therapy: Secondary | ICD-10-CM | POA: Diagnosis not present

## 2015-09-05 DIAGNOSIS — E1165 Type 2 diabetes mellitus with hyperglycemia: Secondary | ICD-10-CM | POA: Diagnosis present

## 2015-09-05 DIAGNOSIS — Z87891 Personal history of nicotine dependence: Secondary | ICD-10-CM | POA: Insufficient documentation

## 2015-09-05 DIAGNOSIS — Z85038 Personal history of other malignant neoplasm of large intestine: Secondary | ICD-10-CM | POA: Insufficient documentation

## 2015-09-05 DIAGNOSIS — R739 Hyperglycemia, unspecified: Secondary | ICD-10-CM

## 2015-09-05 HISTORY — DX: Type 2 diabetes mellitus without complications: E11.9

## 2015-09-05 LAB — BASIC METABOLIC PANEL
ANION GAP: 9 (ref 5–15)
BUN: 9 mg/dL (ref 6–20)
CHLORIDE: 102 mmol/L (ref 101–111)
CO2: 24 mmol/L (ref 22–32)
Calcium: 8.4 mg/dL — ABNORMAL LOW (ref 8.9–10.3)
Creatinine, Ser: 0.82 mg/dL (ref 0.44–1.00)
GFR calc Af Amer: >60 mL/min (ref >60)
GLUCOSE: 350 mg/dL — AB (ref 65–99)
POTASSIUM: 3.7 mmol/L (ref 3.5–5.1)
SODIUM: 135 mmol/L (ref 135–145)

## 2015-09-05 LAB — CBG MONITORING, ED
GLUCOSE-CAPILLARY: 370 mg/dL — AB (ref 65–99)
GLUCOSE-CAPILLARY: 296 mg/dL — AB (ref 65–99)

## 2015-09-05 MED ORDER — SODIUM CHLORIDE 0.9 % IV SOLN: INTRAVENOUS | Status: DC

## 2015-09-05 MED ORDER — SODIUM CHLORIDE 0.9 % IV BOLUS (SEPSIS)
2000.0000 mL | Freq: Once | INTRAVENOUS | Status: AC
  Administered 2015-09-05: 2000 mL via INTRAVENOUS

## 2015-09-05 MED ORDER — METFORMIN HCL 500 MG PO TABS: 500.0000 mg | ORAL_TABLET | Freq: Two times a day (BID) | ORAL | Status: DC

## 2015-09-05 NOTE — ED Notes (Signed)
Pt recently found out she has type 2 DM. Pt tested CBG yesterday and it read high. Read 450 this am and 376 in triage. Has have been having polydipsia and blurred vision at home.

## 2015-09-05 NOTE — ED Provider Notes (Signed)
CSN: [id number]     Arrival date & time 09/05/15  1038 History   First MD Initiated Contact with Patient 09/05/15 1119     Chief Complaint  Patient presents with  . Hyperglycemia     (Consider location/radiation/quality/duration/timing/severity/associated sxs/prior Treatment) HPI Comments: Patient here complaining of polyuria and polydipsia times several days. Blood sugar at home was 450. She was recently prescribed metformin and has been compliant with that. Although she has not been very compliant with her dietary restrictions. Denies any somatic pain at this time. Denies any weakness. No dysuria. Came here today and was sugar was 376 in triage. No other medications used prior to arrival. Nothing makes her symptoms better or worse.  The history is provided by the patient.    Past Medical History  Diagnosis Date  . Cancer ([place])     rectal ca  . Colon cancer ([place])   . Headache   . Anemia   . Abdominal hernia     4 hernias per pt  . Change in bowel movement   . History of rectal bleeding   . Abdominal pain     mid abd pain radiating to right side  . Complication of anesthesia     cardiac arrest during nasal surgery  . Diabetes mellitus without complication [facility])    Past Surgical History  Procedure Laterality Date  . Colon surgery    . Colonoscopy    . Polypectomy    . Colon cancer    . Cesarean section    . Ventral hernia repair N/A 04/02/2014    Procedure: LAPAROSCOPIC  LYSIS OF ADHESIONS AND REPAIR OF VENTRAL INCISIONAL HERNIAS WITH MESH;  Surgeon: Jackolyn Confer, MD;  Location: [facility];  Service: General;  Laterality: N/A;  . Laparoscopic lysis of adhesions  04/02/2014    Procedure: LAPAROSCOPIC LYSIS OF ADHESIONS;  Surgeon: Jackolyn Confer, MD;  Location: [facility];  Service: General;;  . Nose surgery     Family History  Problem Relation Age of Onset  . Colon cancer Maternal Grandmother   . Colon cancer Maternal Aunt    Social History  Substance Use Topics  . Smoking  status: Former Smoker -- 0.25 packs/day for 13 years    Types: Cigarettes    Quit date: 03/14/2003  . Smokeless tobacco: Never Used  . Alcohol Use: No     Comment: occasional    OB History    Gravida Para Term Preterm AB TAB SAB Ectopic Multiple Living   5 3 3  2     3      Review of Systems  All other systems reviewed and are negative.     Allergies  Epinephrine and Lidocaine  Home Medications   Prior to Admission medications   Medication Sig Start Date End Date Taking? Authorizing Provider  atorvastatin (LIPITOR) 20 MG tablet Take 1 tablet (20 mg total) by mouth daily. 09/01/15   Lezlie Lye, NP  glucose blood test strip Use as instructed 09/01/15   Lezlie Lye, NP  ibuprofen (ADVIL,MOTRIN) 200 MG tablet Take 400 mg by mouth every 6 (six) hours as needed (Pain). Reported on 07/12/2015    Historical Provider, MD  Lancets [facility] ULTRASOFT) lancets Use as instructed 09/01/15   Lezlie Lye, NP  metFORMIN (GLUCOPHAGE) 500 MG tablet Take 1 tablet (500 mg total) by mouth 2 (two) times daily with a meal. 09/01/15   Artist Pais Rasch, NP  Wheat Dextrin (BENEFIBER) POWD Take 1 Dose by mouth daily.  07/20/15   Amy S Esterwood, PA-C   BP 110/76 mmHg  Pulse 83  Temp(Src) 98.1 F (36.7 C) (Oral)  Resp 16  SpO2 96% Physical Exam  Constitutional: She is oriented to person, place, and time. She appears well-developed and well-nourished.  Non-toxic appearance. No distress.  HENT:  Head: Normocephalic and atraumatic.  Eyes: Conjunctivae, EOM and lids are normal. Pupils are equal, round, and reactive to light.  Neck: Normal range of motion. Neck supple. No tracheal deviation present. No thyroid mass present.  Cardiovascular: Normal rate, regular rhythm and normal heart sounds.  Exam reveals no gallop.   No murmur heard. Pulmonary/Chest: Effort normal and breath sounds normal. No stridor. No respiratory distress. She has no decreased breath sounds. She has no wheezes. She has no  rhonchi. She has no rales.  Abdominal: Soft. Normal appearance and bowel sounds are normal. She exhibits no distension. There is no tenderness. There is no rebound and no CVA tenderness.  Musculoskeletal: Normal range of motion. She exhibits no edema or tenderness.  Neurological: She is alert and oriented to person, place, and time. She has normal strength. No cranial nerve deficit or sensory deficit. GCS eye subscore is 4. GCS verbal subscore is 5. GCS motor subscore is 6.  Skin: Skin is warm and dry. No abrasion and no rash noted.  Psychiatric: She has a normal mood and affect. Her speech is normal and behavior is normal.  Nursing note and vitals reviewed.   ED Course  Procedures (including critical care time) Labs Review Labs Reviewed  CBG MONITORING, ED - Abnormal; Notable for the following:    Glucose-Capillary 370 (*)    All other components within normal limits    Imaging Review No results found. I have personally reviewed and evaluated these images and lab results as part of my medical decision-making.   EKG Interpretation None      MDM   Final diagnoses:  None    Patient given 2 L of IV saline here and repeat blood sugar has improved. Will adjust her dose of metformin and she will follow-up with her Dr.  Lacretia Leigh, MD 09/05/15 1401

## 2015-09-05 NOTE — ED Notes (Signed)
MD at bedside. 

## 2015-09-05 NOTE — Discharge Instructions (Signed)
Hyperglycemia °Hyperglycemia occurs when the glucose (sugar) in your blood is too high. Hyperglycemia can happen for many reasons, but it most often happens to people who do not know they have diabetes or are not managing their diabetes properly.  °CAUSES  °Whether you have diabetes or not, there are other causes of hyperglycemia. Hyperglycemia can occur when you have diabetes, but it can also occur in other situations that you might not be as aware of, such as: °Diabetes °· If you have diabetes and are having problems controlling your blood glucose, hyperglycemia could occur because of some of the following reasons: °¨ Not following your meal plan. °¨ Not taking your diabetes medications or not taking it properly. °¨ Exercising less or doing less activity than you normally do. °¨ Being sick. °Pre-diabetes °· This cannot be ignored. Before people develop Type 2 diabetes, they almost always have "pre-diabetes." This is when your blood glucose levels are higher than normal, but not yet high enough to be diagnosed as diabetes. Research has shown that some long-term damage to the body, especially the heart and circulatory system, may already be occurring during pre-diabetes. If you take action to manage your blood glucose when you have pre-diabetes, you may delay or prevent Type 2 diabetes from developing. °Stress °· If you have diabetes, you may be "diet" controlled or on oral medications or insulin to control your diabetes. However, you may find that your blood glucose is higher than usual in the hospital whether you have diabetes or not. This is often referred to as "stress hyperglycemia." Stress can elevate your blood glucose. This happens because of hormones put out by the body during times of stress. If stress has been the cause of your high blood glucose, it can be followed regularly by your caregiver. That way he/she can make sure your hyperglycemia does not continue to get worse or progress to  diabetes. °Steroids °· Steroids are medications that act on the infection fighting system (immune system) to block inflammation or infection. One side effect can be a rise in blood glucose. Most people can produce enough extra insulin to allow for this rise, but for those who cannot, steroids make blood glucose levels go even higher. It is not unusual for steroid treatments to "uncover" diabetes that is developing. It is not always possible to determine if the hyperglycemia will go away after the steroids are stopped. A special blood test called an A1c is sometimes done to determine if your blood glucose was elevated before the steroids were started. °SYMPTOMS °· Thirsty. °· Frequent urination. °· Dry mouth. °· Blurred vision. °· Tired or fatigue. °· Weakness. °· Sleepy. °· Tingling in feet or leg. °DIAGNOSIS  °Diagnosis is made by monitoring blood glucose in one or all of the following ways: °· A1c test. This is a chemical found in your blood. °· Fingerstick blood glucose monitoring. °· Laboratory results. °TREATMENT  °First, knowing the cause of the hyperglycemia is important before the hyperglycemia can be treated. Treatment may include, but is not be limited to: °· Education. °· Change or adjustment in medications. °· Change or adjustment in meal plan. °· Treatment for an illness, infection, etc. °· More frequent blood glucose monitoring. °· Change in exercise plan. °· Decreasing or stopping steroids. °· Lifestyle changes. °HOME CARE INSTRUCTIONS  °· Test your blood glucose as directed. °· Exercise regularly. Your caregiver will give you instructions about exercise. Pre-diabetes or diabetes which comes on with stress is helped by exercising. °· Eat wholesome,   balanced meals. Eat often and at regular, fixed times. Your caregiver or nutritionist will give you a meal plan to guide your sugar intake. °· Being at an ideal weight is important. If needed, losing as little as 10 to 15 pounds may help improve blood  glucose levels. °SEEK MEDICAL CARE IF:  °· You have questions about medicine, activity, or diet. °· You continue to have symptoms (problems such as increased thirst, urination, or weight gain). °SEEK IMMEDIATE MEDICAL CARE IF:  °· You are vomiting or have diarrhea. °· Your breath smells fruity. °· You are breathing faster or slower. °· You are very sleepy or incoherent. °· You have numbness, tingling, or pain in your feet or hands. °· You have chest pain. °· Your symptoms get worse even though you have been following your caregiver's orders. °· If you have any other questions or concerns. °  °This information is not intended to replace advice given to you by your health care provider. Make sure you discuss any questions you have with your health care provider. °  °Document Released: 08/15/2000 Document Revised: 05/14/2011 Document Reviewed: 10/26/2014 °Elsevier Interactive Patient Education ©2016 [employer] ° °

## 2015-09-26 ENCOUNTER — Telehealth: Payer: Self-pay | Admitting: *Deleted

## 2015-10-12 ENCOUNTER — Telehealth: Payer: Self-pay | Admitting: Family Medicine

## 2015-10-12 MED FILL — ATORVASTATIN 20 MG TABLET: 20 | 30 days supply | Qty: 30 | Fill #1

## 2015-10-12 MED FILL — metFORMIN HCL 500 MG TABS: 500 | 30 days supply | Qty: 60 | Fill #1

## 2015-10-12 NOTE — Telephone Encounter (Signed)
Pt called for refills. Pt was informed that she needed to come in for a diabetic follow up. Pt denied appt stating that she had her A1C done at the hospital in June and doesn't feel like she needs to come in and doesn't have the money. Pt was told that [place] likes his diabetic pt's to come in every 4 months. Pt still denied appt. Pt requested that refills be requested. Pt needs refills on Metformin and Lipitor sent to [facility] on [address].

## 2015-10-13 MED ORDER — ATORVASTATIN CALCIUM 20 MG PO TABS: 20.0000 mg | ORAL_TABLET | Freq: Every day | ORAL | 1 refills | Status: DC

## 2015-10-13 MED ORDER — METFORMIN HCL 500 MG PO TABS: 500.0000 mg | ORAL_TABLET | Freq: Two times a day (BID) | ORAL | 0 refills | Status: DC

## 2015-10-13 NOTE — Telephone Encounter (Signed)
Pt informed and has appointment 12/14/15

## 2015-10-13 NOTE — Telephone Encounter (Signed)
Let her know that I called the medication in and schedule her for a visit here in October

## 2015-11-08 ENCOUNTER — Encounter: Payer: Self-pay | Admitting: Family

## 2015-11-08 ENCOUNTER — Other Ambulatory Visit (INDEPENDENT_AMBULATORY_CARE_PROVIDER_SITE_OTHER): Payer: BLUE CROSS/BLUE SHIELD

## 2015-11-08 ENCOUNTER — Ambulatory Visit (INDEPENDENT_AMBULATORY_CARE_PROVIDER_SITE_OTHER): Payer: BLUE CROSS/BLUE SHIELD | Admitting: Family

## 2015-11-08 VITALS — BP 120/92 | HR 82 | Temp 98.2°F | Resp 16 | Ht 60.0 in | Wt 186.8 lb

## 2015-11-08 DIAGNOSIS — E785 Hyperlipidemia, unspecified: Secondary | ICD-10-CM | POA: Diagnosis not present

## 2015-11-08 DIAGNOSIS — E1169 Type 2 diabetes mellitus with other specified complication: Secondary | ICD-10-CM | POA: Diagnosis not present

## 2015-11-08 DIAGNOSIS — C189 Malignant neoplasm of colon, unspecified: Secondary | ICD-10-CM | POA: Diagnosis not present

## 2015-11-08 DIAGNOSIS — E118 Type 2 diabetes mellitus with unspecified complications: Secondary | ICD-10-CM

## 2015-11-08 DIAGNOSIS — E669 Obesity, unspecified: Secondary | ICD-10-CM

## 2015-11-08 LAB — LIPID PANEL
CHOL/HDL RATIO: 4
Cholesterol: 143 mg/dL (ref 0–200)
HDL: 34.7 mg/dL — AB (ref >39.00)
LDL Cholesterol: 79 mg/dL (ref 0–99)
NONHDL: 108.58
Triglycerides: 148 mg/dL (ref 0.0–149.0)
VLDL: 29.6 mg/dL (ref 0.0–40.0)

## 2015-11-08 LAB — MICROALBUMIN / CREATININE URINE RATIO
CREATININE, U: 332.3 mg/dL
MICROALB UR: 1.8 mg/dL (ref 0.0–1.9)
MICROALB/CREAT RATIO: 0.5 mg/g (ref 0.0–30.0)

## 2015-11-08 LAB — COMPREHENSIVE METABOLIC PANEL
ALK PHOS: 55 U/L (ref 39–117)
ALT: 11 U/L (ref 0–35)
AST: 12 U/L (ref 0–37)
Albumin: 4.3 g/dL (ref 3.5–5.2)
BILIRUBIN TOTAL: 0.4 mg/dL (ref 0.2–1.2)
BUN: 11 mg/dL (ref 6–23)
CO2: 29 meq/L (ref 19–32)
CREATININE: 0.84 mg/dL (ref 0.40–1.20)
Calcium: 8.7 mg/dL (ref 8.4–10.5)
Chloride: 107 mEq/L (ref 96–112)
GFR: 89.98 mL/min (ref >60.00)
GLUCOSE: 106 mg/dL — AB (ref 70–99)
Potassium: 3.8 mEq/L (ref 3.5–5.1)
Sodium: 141 mEq/L (ref 135–145)
TOTAL PROTEIN: 6.9 g/dL (ref 6.0–8.3)

## 2015-11-08 LAB — HEMOGLOBIN A1C: Hgb A1c MFr Bld: 9 % — ABNORMAL HIGH (ref 4.6–6.5)

## 2015-11-08 NOTE — Assessment & Plan Note (Signed)
Previous a diagnosed with hyperlipidemia and currently maintained on atorvastatin with no adverse side effects. Obtain lipid profile and continue current dosage of atorvastatin pending lipid profile results.

## 2015-11-08 NOTE — Progress Notes (Signed)
Subjective:    Patient ID: [name] [name], female    DOB: 1960/04/02, 55 y.o.   MRN: [id number]  Chief Complaint  Patient presents with  . Establish Care    diabetes management    HPI:  [name] [name] is a 55 y.o. female who  has a past medical history of Abdominal hernia; Abdominal pain; Anemia; Cancer ([place]); Change in bowel movement; Colon cancer ([place]); Complication of anesthesia; Diabetes mellitus without complication ([place]); Headache; and History of rectal bleeding. and presents today for an office visit to establish care.   1.) Type 2 diabetes - Currently maintained on metformin. Reports taking the medication as prescribed and denies adverse side effects. Denies any symptoms of end organ damage. Blood sugars are averaging in the low-mid 100s. Due for a diabetic foot exam and pneumoccal vaccination.   Lab Results  Component Value Date   HGBA1C 9.0 (H) 11/08/2015    2.) Hyperlipidemia - Previously diagnosed with hyperlipidemia and currently maintained on atorvastatin. Reports taking the medication as prescribed and denies adverse side effects or myalgias.  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 She was noted to have one of 19 lymph nodes positive and treated with 5-FU radiation and then Xeloda short term with no further chemotherapy performed. Most recent colonoscopy completed in May of 2017 with at least one polyp being removed being adenomatous. The recommended follow up is 5 years. Denies any current symptoms.    Allergies  Allergen Reactions  . Epinephrine Anaphylaxis  . Lidocaine Anaphylaxis      Outpatient Medications Prior to Visit  Medication Sig Dispense  Refill  . atorvastatin (LIPITOR) 20 MG tablet Take 1 tablet (20 mg total) by mouth daily. 90 tablet 1  . glucose blood test strip Use as instructed 100 each 12  . ibuprofen (ADVIL,MOTRIN) 200 MG tablet Take 400 mg by mouth every 6 (six) hours as needed for mild pain or moderate pain. Reported on 07/12/2015    . Lancets (ONETOUCH ULTRASOFT) lancets Use as instructed 100 each 12  . metFORMIN (GLUCOPHAGE) 500 MG tablet Take 1 tablet (500 mg total) by mouth 2 (two) times daily with a meal. Take 2 tablets in the morning and 1 tablet at night 180 tablet 0  . Wheat Dextrin (BENEFIBER) POWD Take 1 Dose by mouth daily. 80 g 0   No facility-administered medications prior to visit.       Past Surgical History:  Procedure Laterality Date  . CESAREAN SECTION    . colon cancer    . COLON SURGERY    . COLONOSCOPY    . LAPAROSCOPIC LYSIS OF ADHESIONS  04/02/2014   Procedure: LAPAROSCOPIC LYSIS OF ADHESIONS;  Surgeon: Jackolyn Confer, MD;  Location: [facility];  Service: General;;  . NOSE SURGERY    . POLYPECTOMY    . VENTRAL HERNIA REPAIR N/A 04/02/2014   Procedure: LAPAROSCOPIC  LYSIS OF ADHESIONS AND REPAIR OF VENTRAL INCISIONAL HERNIAS WITH MESH;  Surgeon: Jackolyn Confer, MD;  Location: [facility];  Service: General;  Laterality: N/A;      Past Medical History:  Diagnosis Date  . Abdominal hernia    4 hernias per pt  . Abdominal pain  mid abd pain radiating to right side  . Anemia   . Cancer ([place])    rectal ca  . Change in bowel movement   . Colon cancer ([place])   . Complication of anesthesia    cardiac arrest during nasal surgery  . Diabetes mellitus without complication ([place])   . Headache   . History of rectal bleeding     Review of Systems  Eyes:       Denies changes in vision  Respiratory: Negative for chest tightness and shortness of breath.   Cardiovascular: Negative for chest pain, palpitations and leg swelling.  Endocrine: Negative for polydipsia, polyphagia and polyuria.    Neurological: Negative for dizziness, weakness and numbness.      Objective:    BP (!) 120/92 (BP Location: Left Arm, Patient Position: Sitting, Cuff Size: Large)   Pulse 82   Temp 98.2 F (36.8 C) (Oral)   Resp 16   Ht 5' (1.524 m)   Wt 186 lb 12.8 oz (84.7 kg)   SpO2 94%   BMI 36.48 kg/m  Nursing note and vital signs reviewed.  Physical Exam  Constitutional: She is oriented to person, place, and time. She appears well-developed and well-nourished. No distress.  Cardiovascular: Normal rate, regular rhythm, normal heart sounds and intact distal pulses.   Pulmonary/Chest: Effort normal and breath sounds normal.  Neurological: She is alert and oriented to person, place, and time.  Diabetic Foot Exam - Simple   Simple Foot Form Diabetic Foot exam was performed with the following findings:  Yes  11/08/2015  1:52 PM  Visual Inspection No deformities, no ulcerations, no other skin breakdown bilaterally:  Yes Sensation Testing Intact to touch and monofilament testing bilaterally:  Yes Pulse Check Posterior Tibialis and Dorsalis pulse intact bilaterally:  Yes Comments  Skin: Skin is warm and dry.  Psychiatric: She has a normal mood and affect. Her behavior is normal. Judgment and thought content normal.       Assessment & Plan:   Problem List Items Addressed This Visit      Digestive   Colon cancer [facility])    Most recent colonoscopy in May 2017 with adenomatous polyp and recommended follow up colonoscopy in 5 years. No current symptoms. Follow up and additional treatment per GI. Continue to monitor.         Endocrine   Type 2 diabetes with complication (H[facility]) - Primary    Type 2 diabetes with apparent improved control given blood sugars provided by patient. Obtain A1c, urine microalbumin, and complete metabolic profile. Declines Pneumovax today. Diabetic foot exam completed today. Diabetic eye exam is up-to-date. Currently maintained on atorvastatin for CAD risk reduction.  Continue current dosage of metformin pending A1c results.      Relevant Orders   Hemoglobin A1c (Completed)   Urine Microalbumin w/creat. ratio (Completed)   Lipid Profile (Completed)   Comp Met (CMET) (Completed)     Other   Obesity (BMI 30-39.9)    Recommend weight loss of 5-10% of current body weight. Recommend increasing physical activity to 30 minutes of moderate level activity daily. Encourage nutritional intake that focuses on nutrient dense foods and is moderate, varied, and balanced and is low in saturated fats and processed/sugary foods. Continue to monitor.       Hyperlipidemia associated with type 2 diabetes mellitus ([place])    Previous a diagnosed with hyperlipidemia and currently maintained on atorvastatin with no adverse side effects. Obtain lipid profile and continue current dosage of atorvastatin  pending lipid profile results.       Other Visit Diagnoses   None.      I am having Ms. Newlon maintain her ibuprofen, BENEFIBER, glucose blood, onetouch ultrasoft, atorvastatin, and metFORMIN.   Follow-up: Return in about 3 months (around 02/07/2016).  Mauricio Po, FNP

## 2015-11-08 NOTE — Assessment & Plan Note (Signed)
Most recent colonoscopy in May 2017 with adenomatous polyp and recommended follow up colonoscopy in 5 years. No current symptoms. Follow up and additional treatment per GI. Continue to monitor.

## 2015-11-08 NOTE — Assessment & Plan Note (Signed)
Type 2 diabetes with apparent improved control given blood sugars provided by patient. Obtain A1c, urine microalbumin, and complete metabolic profile. Declines Pneumovax today. Diabetic foot exam completed today. Diabetic eye exam is up-to-date. Currently maintained on atorvastatin for CAD risk reduction. Continue current dosage of metformin pending A1c results.

## 2015-11-08 NOTE — Assessment & Plan Note (Signed)
Recommend weight loss of 5-10% of current body weight. Recommend increasing physical activity to 30 minutes of moderate level activity daily. Encourage nutritional intake that focuses on nutrient dense foods and is moderate, varied, and balanced and is low in saturated fats and processed/sugary foods. Continue to monitor.

## 2015-11-08 NOTE — Patient Instructions (Addendum)
Thank you for choosing [facility].  SUMMARY AND INSTRUCTIONS:  Please continue to take your medication as prescribed.  Medication:  Your prescription(s) have been submitted to your pharmacy or been printed and provided for you. Please take as directed and contact our office if you believe you are having problem(s) with the medication(s) or have any questions.  Labs:  Please stop by the lab on the lower level of the building for your blood work. Your results will be released to [place] (or called to you) after review, usually within 72 hours after test completion. If any changes need to be made, you will be notified at that same time.  1.) The lab is open from 7:30am to 5:30 pm Monday-Friday 2.) No appointment is necessary 3.) Fasting (if needed) is 6-8 hours after food and drink; black coffee and water are okay     DIABETES CARE INSTRUCTIONS:  Current A1c:  Lab Results  Component Value Date   HGBA1C 11.8 (H) 09/01/2015    A1C Goal: <7.0%  Fasting Blood Sugar Goal: 80-130 Blood sugar check that you take after fasting for at least 8 hours which is usually in the morning before eating.  Post-prandial Blood Sugar Goal: <180 Blood sugar check approximately 1-2 hours after the start of a meal.    Diabetes Prevention Screens:  1.) Ensure you have an annual eye exam by an ophthalmologist or optometrist.   2,) Ensure you have an annual foot exam or when any changes are noted including new onset numbness/tingling or wounds. Check your feet daily!  3.) The American Diabetes Association recommends the Pneumovax vaccination against pneumonia every 5 years.  4.) We will check your kidney function with a urine test on an annual basis.

## 2015-11-11 MED ORDER — METFORMIN HCL 1000 MG PO TABS: 1000.0000 mg | ORAL_TABLET | Freq: Two times a day (BID) | ORAL | 3 refills | Status: DC

## 2015-12-13 ENCOUNTER — Telehealth: Payer: Self-pay | Admitting: Family Medicine

## 2015-12-13 NOTE — Telephone Encounter (Signed)
Called to see if pt wanted to reschedule her diabetes check and she stated she just had a physical with [facility] and did not need to come in for a visit here and spend more money they checked her diabetes there also.

## 2015-12-14 ENCOUNTER — Ambulatory Visit: Payer: Self-pay | Admitting: Family Medicine

## 2016-01-07 ENCOUNTER — Telehealth: Payer: Self-pay | Admitting: Family

## 2016-01-07 NOTE — Telephone Encounter (Signed)
Pt called and LVM on Saturday Clinic line. Pt request refill of metformin 500mg .   Pt states the 100mg  makes her sick. Please advise

## 2016-01-07 NOTE — Telephone Encounter (Signed)
Correction--the 1000mg  makes pt sick

## 2016-01-08 MED ORDER — METFORMIN HCL 500 MG PO TABS: 500.0000 mg | ORAL_TABLET | Freq: Two times a day (BID) | ORAL | 0 refills | Status: DC

## 2016-01-08 NOTE — Telephone Encounter (Signed)
Metformin sent to pharmacy. Please have patient check her formulary to see what other medications are affordable. We will need to follow up for an A1c.

## 2016-01-09 NOTE — Telephone Encounter (Signed)
Pt aware.

## 2016-01-10 ENCOUNTER — Telehealth: Payer: Self-pay | Admitting: *Deleted

## 2016-01-10 NOTE — Telephone Encounter (Signed)
Pt left msg on triage stating she has check w/her insurance and they will cover Metformin ER...Johny Chess

## 2016-01-11 MED ORDER — METFORMIN HCL ER 500 MG PO TB24: 1000.0000 mg | ORAL_TABLET | Freq: Every day | ORAL | 0 refills | Status: DC

## 2016-01-11 NOTE — Telephone Encounter (Signed)
Please stop taking the metformin and start taking the metformin xr. Please ask if she eats breakfast on a regular basis as we may be able to add glipizide.

## 2016-01-11 NOTE — Telephone Encounter (Signed)
Notified pt with Greg response.../lmb 

## 2016-02-09 ENCOUNTER — Other Ambulatory Visit: Payer: Self-pay | Admitting: *Deleted

## 2016-02-09 MED ORDER — ATORVASTATIN CALCIUM 20 MG PO TABS: 20.0000 mg | ORAL_TABLET | Freq: Every day | ORAL | 1 refills | Status: DC

## 2016-03-16 ENCOUNTER — Telehealth: Payer: Self-pay

## 2016-03-16 MED ORDER — METFORMIN HCL ER 500 MG PO TB24: 1000.0000 mg | ORAL_TABLET | Freq: Every day | ORAL | 0 refills | Status: DC

## 2016-03-16 NOTE — Telephone Encounter (Signed)
Pt called and rq rf for metformin. Pt informed that pt would need an appt for any additional refills.

## 2016-03-16 NOTE — Telephone Encounter (Signed)
Pt has made an appt to follow up.

## 2016-03-23 ENCOUNTER — Ambulatory Visit: Payer: BLUE CROSS/BLUE SHIELD | Admitting: Family

## 2016-04-09 ENCOUNTER — Ambulatory Visit (INDEPENDENT_AMBULATORY_CARE_PROVIDER_SITE_OTHER): Payer: BLUE CROSS/BLUE SHIELD | Admitting: Family

## 2016-04-09 ENCOUNTER — Encounter: Payer: Self-pay | Admitting: Family

## 2016-04-09 VITALS — BP 130/86 | HR 76 | Temp 97.7°F | Resp 16 | Ht 60.0 in | Wt 186.0 lb

## 2016-04-09 DIAGNOSIS — E118 Type 2 diabetes mellitus with unspecified complications: Secondary | ICD-10-CM | POA: Diagnosis not present

## 2016-04-09 LAB — POCT GLYCOSYLATED HEMOGLOBIN (HGB A1C): Hemoglobin A1C: 8.2

## 2016-04-09 MED ORDER — METFORMIN HCL ER 500 MG PO TB24: 500.0000 mg | ORAL_TABLET | Freq: Every day | ORAL | 0 refills | Status: DC

## 2016-04-09 NOTE — Assessment & Plan Note (Signed)
Type 2 diabetes with mild improvement in A1c down to 8.2. Declines Pneumovax. Continue current dosage of metformin. Patient will check formulary to determine medication coverage for possible addition of GLP-1 agonist. Maintained on atorvastatin for CAD risk reduction. Diabetic foot exam and eye exams are up-to-date respectively. Continue to monitor blood sugars at home. Follow-up in 3 months pending addition of new medication.

## 2016-04-09 NOTE — Patient Instructions (Addendum)
Thank you for choosing [facility].  SUMMARY AND INSTRUCTIONS:  Please check on the coverage / price of  Farxiga Jardiance Trulicity [place]   Continue to monitor your blood sugars at home.   Medication:  Continue to take your metformin.  Follow up:  If your symptoms worsen or fail to improve, please contact our office for further instruction, or in case of emergency go directly to the emergency room at the closest medical facility.

## 2016-04-09 NOTE — Progress Notes (Signed)
Subjective:    Patient ID: [name] [name], female    DOB: 23-May-1960, 56 y.o.   MRN: [id number]  Chief Complaint  Patient presents with  . Follow-up    diabetes check    HPI:  [name] [name] is a 56 y.o. female who  has a past medical history of Abdominal hernia; Abdominal pain; Anemia; Cancer ([place]); Change in bowel movement; Colon cancer ([place]); Complication of anesthesia; Diabetes mellitus without complication ([place]); Headache; and History of rectal bleeding. and presents today for a follow up office visit.  Type 2 diabetes - currently maintained on metformin. Reports taking medication as prescribed and notes when taking the 1000 mg she does not feel right so has been taking only 500 mg. Blood sugars at home are generally less than 130 on average. Denies hypoglycemic readings. No blurry vision or new symptoms of end organ damage. Nutrition she continues to watches what she eats. Has been exercising sporadically  Lab Results  Component Value Date   HGBA1C 8.2 04/09/2016       Allergies  Allergen Reactions  . Epinephrine Anaphylaxis  . Lidocaine Anaphylaxis      Outpatient Medications Prior to Visit  Medication Sig Dispense Refill  . atorvastatin (LIPITOR) 20 MG tablet Take 1 tablet (20 mg total) by mouth daily. 90 tablet 1  . glucose blood test strip Use as instructed 100 each 12  . ibuprofen (ADVIL,MOTRIN) 200 MG tablet Take 400 mg by mouth every 6 (six) hours as needed for mild pain or moderate pain. Reported on 07/12/2015    . Lancets (ONETOUCH ULTRASOFT) lancets Use as instructed 100 each 12  . Wheat Dextrin (BENEFIBER) POWD Take 1 Dose by mouth daily. 80 g 0  . metFORMIN (GLUCOPHAGE XR) 500 MG 24 hr tablet Take 2 tablets (1,000 mg total) by mouth daily with breakfast. 180 tablet 0   No facility-administered medications prior to visit.     Review of Systems  Eyes:       Denies changes in vision  Respiratory: Negative for chest tightness and shortness of breath.     Cardiovascular: Negative for chest pain, palpitations and leg swelling.  Endocrine: Negative for polydipsia, polyphagia and polyuria.  Neurological: Negative for dizziness, weakness and numbness.      Objective:    BP 130/86 (BP Location: Left Arm, Patient Position: Sitting, Cuff Size: Large)   Pulse 76   Temp 97.7 F (36.5 C) (Oral)   Resp 16   Ht 5' (1.524 m)   Wt 186 lb (84.4 kg)   SpO2 96%   BMI 36.33 kg/m  Nursing note and vital signs reviewed.  Physical Exam  Constitutional: She is oriented to person, place, and time. She appears well-developed and well-nourished. No distress.  Cardiovascular: Normal rate, regular rhythm, normal heart sounds and intact distal pulses.   Pulmonary/Chest: Effort normal and breath sounds normal.  Neurological: She is alert and oriented to person, place, and time.  Skin: Skin is warm and dry.  Psychiatric: She has a normal mood and affect. Her behavior is normal. Judgment and thought content normal.       Assessment & Plan:   Problem List Items Addressed This Visit      Endocrine   Type 2 diabetes with complication ([place]) - Primary    Type 2 diabetes with mild improvement in A1c down to 8.2. Declines Pneumovax. Continue current dosage of metformin. Patient will check formulary to determine medication coverage for possible addition of GLP-1 agonist. Maintained on atorvastatin  for CAD risk reduction. Diabetic foot exam and eye exams are up-to-date respectively. Continue to monitor blood sugars at home. Follow-up in 3 months pending addition of new medication.      Relevant Medications   metFORMIN (GLUCOPHAGE XR) 500 MG 24 hr tablet   Other Relevant Orders   POCT HgB A1C (Completed)       I have changed [name] [name]'s metFORMIN. I am also having her maintain her ibuprofen, BENEFIBER, glucose blood, onetouch ultrasoft, and atorvastatin.   Follow-up: Return in about 3 months (around 07/07/2016), or if symptoms worsen or fail to  improve.  Mauricio Po, FNP

## 2016-06-07 ENCOUNTER — Telehealth: Payer: Self-pay | Admitting: Family

## 2016-06-07 ENCOUNTER — Other Ambulatory Visit: Payer: Self-pay | Admitting: *Deleted

## 2016-06-07 MED ORDER — ATORVASTATIN CALCIUM 20 MG PO TABS: 20.0000 mg | ORAL_TABLET | Freq: Every day | ORAL | 1 refills | Status: DC

## 2016-06-07 NOTE — Telephone Encounter (Signed)
Rec'd call pt states the metformin is giving her a yeast infection. Requesting rx [facility].Marland KitchenJohny Chess

## 2016-06-07 NOTE — Telephone Encounter (Signed)
[facility]  Patient Name: [name] [name]  DOB: [birth year]    Initial Comment CBWN. Caller needs Yeast Infection pill called in. No pressure while peeing, after effect, itching. Urinating frequency    Nurse Assessment  Nurse: Laqueta Due, RN, Metallurgist (Eastern Time): 06/07/2016 9:20:02 PM  Confirm and document reason for call. If symptomatic, describe symptoms. ---Caller states that wants diflucan for yeast infection. Urination frequency, itching after urinating, no pressure when urinating. No burning. no vaginal discharge. no fever no other symptoms  Does the patient have any new or worsening symptoms? ---Yes  Will a triage be completed? ---Yes  Related visit to physician within the last 2 weeks? ---No  Does the PT have any chronic conditions? (i.e. diabetes, asthma, etc.) ---Yes  List chronic conditions. ---diabetes Type II  Is this a behavioral health or substance abuse call? ---No     Guidelines    Guideline Title Affirmed Question Affirmed Notes  Urinary Symptoms All other urine symptoms   Vaginal Symptoms [1] Vaginal itching AND [2] not improved > 3 days following CARE ADVICE    Final Disposition User   See PCP When Office is Open (within 3 days) Laqueta Due, Therapist, sports, Museum/gallery conservator    Comments  CBWN. Pressure has gotten worse and she's worried that she won't be able to sleep.  Used Monistat last night didn't help  Caller states refuses appointment scheduling at this time, but if not better by Monday will call the office to schedule an appointment.   Referrals  REFERRED TO PCP OFFICE   Disagree/Comply: Comply    Disagree/Comply: Comply

## 2016-06-10 MED ORDER — FLUCONAZOLE 150 MG PO TABS: 150.0000 mg | ORAL_TABLET | Freq: Once | ORAL | 0 refills | Status: AC

## 2016-06-10 NOTE — Telephone Encounter (Signed)
Sent to [facility] 

## 2016-06-11 MED FILL — FLUCONAZOLE 150 MG TABLET: 150 | 1 days supply | Qty: 1 | Fill #0

## 2016-06-13 ENCOUNTER — Telehealth: Payer: Self-pay | Admitting: Family

## 2016-06-13 NOTE — Telephone Encounter (Signed)
Pt also thinks she needs to get her urine checked can she be put in this week? She feels her bladder fills up really quickly. after she drinks she instantly has pressure in her bladder.  Pt is constantly thirsty. Thinks all of this has to do with her Metformin or something is wrong with her kidneys.

## 2016-06-13 NOTE — Telephone Encounter (Signed)
Pt called stating her BS will not stabilize the AM it was 356 then it will go down and it fluctuates all day. Last visit a insulin pen was discussed and wondered if this would help her more. Pt would like to try the pen.  Please advise.

## 2016-06-13 NOTE — Telephone Encounter (Signed)
Lm to notify Pt.

## 2016-06-13 NOTE — Telephone Encounter (Signed)
Please have her schedule an office visit. There are spots on Friday, unfortunately cannot work her in tomorrow. If needs to be seen sooner, then either another provider or urgent care.

## 2016-06-15 ENCOUNTER — Encounter: Payer: Self-pay | Admitting: Family

## 2016-06-15 ENCOUNTER — Ambulatory Visit (INDEPENDENT_AMBULATORY_CARE_PROVIDER_SITE_OTHER): Payer: BLUE CROSS/BLUE SHIELD | Admitting: Family

## 2016-06-15 ENCOUNTER — Other Ambulatory Visit: Payer: BLUE CROSS/BLUE SHIELD

## 2016-06-15 VITALS — BP 144/82 | HR 76 | Temp 97.9°F | Resp 16 | Ht 60.0 in | Wt 185.8 lb

## 2016-06-15 DIAGNOSIS — E118 Type 2 diabetes mellitus with unspecified complications: Secondary | ICD-10-CM | POA: Diagnosis not present

## 2016-06-15 DIAGNOSIS — R350 Frequency of micturition: Secondary | ICD-10-CM

## 2016-06-15 LAB — POCT URINALYSIS DIPSTICK
BILIRUBIN UA: NEGATIVE
KETONES UA: NEGATIVE
LEUKOCYTES UA: NEGATIVE
NITRITE UA: NEGATIVE
Protein, UA: NEGATIVE
RBC UA: NEGATIVE
Spec Grav, UA: 1.025 (ref 1.010–1.025)
Urobilinogen, UA: NEGATIVE E.U./dL — AB
pH, UA: 6 (ref 5.0–8.0)

## 2016-06-15 LAB — POCT GLYCOSYLATED HEMOGLOBIN (HGB A1C): Hemoglobin A1C: 11.1

## 2016-06-15 LAB — MICROALBUMIN / CREATININE URINE RATIO
Creatinine,U: 137.8 mg/dL
Microalb Creat Ratio: 3.1 mg/g (ref 0.0–30.0)
Microalb, Ur: 4.3 mg/dL — ABNORMAL HIGH (ref 0.0–1.9)

## 2016-06-15 MED ORDER — DULAGLUTIDE 0.75 MG/0.5ML ~~LOC~~ SOAJ: 0.7500 mg | SUBCUTANEOUS | 0 refills | Status: DC

## 2016-06-15 MED ORDER — GLIPIZIDE ER 5 MG PO TB24: 5.0000 mg | ORAL_TABLET | Freq: Every day | ORAL | 0 refills | Status: DC

## 2016-06-15 MED FILL — glipiZIDE XL 5 MG TB24: 5 | 30 days supply | Qty: 30 | Fill #0

## 2016-06-15 NOTE — Addendum Note (Signed)
Addended by: Mauricio Po D on: 06/15/2016 01:00 PM   Modules accepted: Orders

## 2016-06-15 NOTE — Assessment & Plan Note (Signed)
Most likely associated with poor blood sugar control with no evidence of urinary tract infection.

## 2016-06-15 NOTE — Addendum Note (Signed)
Addended by: Delice Bison E on: 06/15/2016 03:27 PM   Modules accepted: Orders

## 2016-06-15 NOTE — Progress Notes (Signed)
Subjective:    Patient ID: [name] [name], female    DOB: [birth year], 56 y.o.   MRN: [id number]  Chief Complaint  Patient presents with  . Hyperglycemia    sugars have been up and down, urinary frequency x2 weeks     HPI:  [name] [name] is a 56 y.o. female who  has a past medical history of Abdominal hernia; Abdominal pain; Anemia; Cancer ([place]); Change in bowel movement; Colon cancer ([place]); Complication of anesthesia; Diabetes mellitus without complication ([place]); Headache; and History of rectal bleeding. and presents today for an office visit.  Associated symptom of urinary frequency and elevated blood sugars have been going on for about 2 weeks. Denies urgency, dysuria, fevers, chills, or flank pain. Has noted that she has increased levels of thirst. Blood sugars at home have been running higher into the upper 200 to 300s. Reports taking her metformin as prescribed with increased dosage making her feel more fatigued. Denies new symptoms of end organ damage.    Lab Results  Component Value Date   HGBA1C 11.1 06/15/2016     Allergies  Allergen Reactions  . Epinephrine Anaphylaxis  . Lidocaine Anaphylaxis      Outpatient Medications Prior to Visit  Medication Sig Dispense Refill  . atorvastatin (LIPITOR) 20 MG tablet Take 1 tablet (20 mg total) by mouth daily. 90 tablet 1  . glucose blood test strip Use as instructed 100 each 12  . ibuprofen (ADVIL,MOTRIN) 200 MG tablet Take 400 mg by mouth every 6 (six) hours as needed for mild pain or moderate pain. Reported on 07/12/2015    . Lancets (ONETOUCH ULTRASOFT) lancets Use as instructed 100 each 12  . metFORMIN (GLUCOPHAGE XR) 500 MG 24 hr tablet Take 1 tablet (500 mg total) by mouth daily with breakfast. 180 tablet 0  . Wheat Dextrin (BENEFIBER) POWD Take 1 Dose by mouth daily. 80 g 0   No facility-administered medications prior to visit.     Review of Systems  Constitutional: Positive for fatigue. Negative for chills and  fever.  Eyes: Negative for visual disturbance.  Endocrine: Positive for polydipsia and polyuria.  Genitourinary: Positive for frequency. Negative for dysuria, flank pain, hematuria and urgency.  Neurological: Negative for dizziness and light-headedness.      Objective:    BP (!) 144/82 (BP Location: Left Arm, Patient Position: Sitting, Cuff Size: Large)   Pulse 76   Temp 97.9 F (36.6 C) (Oral)   Resp 16   Ht 5' (1.524 m)   Wt 185 lb 12.8 oz (84.3 kg)   SpO2 98%   BMI 36.29 kg/m  Nursing note and vital signs reviewed.  Physical Exam  Constitutional: She is oriented to person, place, and time. She appears well-developed and well-nourished. No distress.  Cardiovascular: Normal rate, regular rhythm, normal heart sounds and intact distal pulses.   Pulmonary/Chest: Effort normal and breath sounds normal.  Neurological: She is alert and oriented to person, place, and time.  Skin: Skin is warm and dry.  Psychiatric: She has a normal mood and affect. Her behavior is normal. Judgment and thought content normal.       Assessment & Plan:   Problem List Items Addressed This Visit      Endocrine   Type 2 diabetes with complication ([place])    In office UA negative for leukocytes, nitrites, hematuria and positive for glucose. No evidence of infection. POCT A1c of 11.1 indicating poor control with current metformin. Start Glipizide ER and Trulicity. Encouraged to  work on a carbohydrate modified diet. Continue to monitor blood sugars at home 1-2 times per day and follow up in 3 weeks with blood sugar numbers.       Relevant Medications   glipiZIDE (GLUCOTROL XL) 5 MG 24 hr tablet   Other Relevant Orders   Microalbumin, urine   POCT HgB A1C (Completed)     Other   Urinary frequency - Primary    Most likely associated with poor blood sugar control with no evidence of urinary tract infection.       Relevant Orders   POCT urinalysis dipstick (Completed)       I am having Ms. Conroy  start on glipiZIDE. I am also having her maintain her ibuprofen, BENEFIBER, glucose blood, onetouch ultrasoft, metFORMIN, and atorvastatin.   Meds ordered this encounter  Medications  . glipiZIDE (GLUCOTROL XL) 5 MG 24 hr tablet    Sig: Take 1 tablet (5 mg total) by mouth daily with breakfast.    Dispense:  30 tablet    Refill:  0    Order Specific Question:   Supervising Provider    Answer:   Pricilla Holm A [[id number]]     Follow-up: Return in about 3 weeks (around 07/06/2016), or if symptoms worsen or fail to improve.  Mauricio Po, FNP

## 2016-06-15 NOTE — Patient Instructions (Signed)
Thank you for choosing [facility].  SUMMARY AND INSTRUCTIONS:  Please continue to take your metformin.  Start taking the glipizide ER in the morning with a meal.   Start the Trulicity weekly. Please check on the coverage of Trulicity, Bydureon B-Cise, or Victoza. (There are coupons available for each)  Continue to monitor your blood sugar at home 1-2 times per day.  Follow up with blood sugar numbers in 3 weeks.  Medication:  Your prescription(s) have been submitted to your pharmacy or been printed and provided for you. Please take as directed and contact our office if you believe you are having problem(s) with the medication(s) or have any questions.  Follow up:  If your symptoms worsen or fail to improve, please contact our office for further instruction, or in case of emergency go directly to the emergency room at the closest medical facility.

## 2016-06-15 NOTE — Assessment & Plan Note (Signed)
In office UA negative for leukocytes, nitrites, hematuria and positive for glucose. No evidence of infection. POCT A1c of 11.1 indicating poor control with current metformin. Start Glipizide ER and Trulicity. Encouraged to work on a carbohydrate modified diet. Continue to monitor blood sugars at home 1-2 times per day and follow up in 3 weeks with blood sugar numbers.

## 2016-06-22 ENCOUNTER — Telehealth: Payer: Self-pay | Admitting: Family

## 2016-06-22 NOTE — Telephone Encounter (Signed)
Pt called and stated Dulaglutide (TRULICITY) 7.22 VJ/5.0NX SOPN  Is working great, her A1C went down 100pts, today it was 250. She has one sample left. Can she receive more samples to her her through until she can get Rx?  She would like a Rx for this. [facility] [address] in [place]

## 2016-06-26 MED ORDER — DULAGLUTIDE 1.5 MG/0.5ML ~~LOC~~ SOAJ: 1.5000 mg | SUBCUTANEOUS | 2 refills | Status: DC

## 2016-06-26 NOTE — Telephone Encounter (Signed)
New dose of Trulicity sent to pharmacy.

## 2016-08-22 ENCOUNTER — Other Ambulatory Visit: Payer: Self-pay | Admitting: Family

## 2016-09-19 ENCOUNTER — Telehealth: Payer: Self-pay | Admitting: Family

## 2016-09-19 MED ORDER — DULAGLUTIDE 1.5 MG/0.5ML ~~LOC~~ SOAJ: SUBCUTANEOUS | 0 refills | Status: DC

## 2016-09-19 NOTE — Telephone Encounter (Signed)
Resent to [facility].

## 2016-09-19 NOTE — Telephone Encounter (Signed)
Pt called in and needs this refill sent to [facility], [facility] stated that they could not fill it.  This med needs to be sent to [facility]

## 2016-09-19 NOTE — Addendum Note (Signed)
Addended by: Delice Bison E on: 09/19/2016 01:27 PM   Modules accepted: Orders

## 2016-09-21 ENCOUNTER — Telehealth: Payer: Self-pay | Admitting: Family

## 2016-09-21 NOTE — Telephone Encounter (Signed)
Patient states she is out of insulin.  She is waiting on mail order pharmacy.  Would like to know if Marya Amsler had samples of trulicity she could pick up before close today.

## 2016-09-21 NOTE — Telephone Encounter (Signed)
Pt will not be able to get by to get the samples until Monday. I stated we will have them here when she come by Monday.

## 2016-09-21 NOTE — Telephone Encounter (Signed)
Noted  

## 2016-09-25 MED ORDER — DULAGLUTIDE 1.5 MG/0.5ML ~~LOC~~ SOAJ: SUBCUTANEOUS | 0 refills | Status: DC

## 2016-09-25 NOTE — Telephone Encounter (Signed)
[name] [name] spoke w/pt she has been trying to get her trulicity from [facility], which rx has ben sent, but pt is having a heard time w/them. [facility] [address]. Inform [facility]ittany [facility].../lmb

## 2016-09-25 NOTE — Addendum Note (Signed)
Addended by: Earnstine Regal on: 09/25/2016 03:08 PM   Modules accepted: Orders

## 2016-09-27 ENCOUNTER — Other Ambulatory Visit: Payer: Self-pay | Admitting: Emergency Medicine

## 2016-09-27 MED ORDER — DULAGLUTIDE 1.5 MG/0.5ML ~~LOC~~ SOAJ: SUBCUTANEOUS | 0 refills | Status: DC

## 2016-10-01 ENCOUNTER — Other Ambulatory Visit (INDEPENDENT_AMBULATORY_CARE_PROVIDER_SITE_OTHER): Payer: BLUE CROSS/BLUE SHIELD

## 2016-10-01 ENCOUNTER — Ambulatory Visit (INDEPENDENT_AMBULATORY_CARE_PROVIDER_SITE_OTHER): Payer: BLUE CROSS/BLUE SHIELD | Admitting: Family

## 2016-10-01 ENCOUNTER — Encounter: Payer: Self-pay | Admitting: Family

## 2016-10-01 VITALS — BP 122/82 | HR 84 | Temp 98.2°F | Resp 16 | Ht 60.0 in | Wt 181.0 lb

## 2016-10-01 DIAGNOSIS — E118 Type 2 diabetes mellitus with unspecified complications: Secondary | ICD-10-CM

## 2016-10-01 LAB — HEMOGLOBIN A1C: Hgb A1c MFr Bld: 7.2 % — ABNORMAL HIGH (ref 4.6–6.5)

## 2016-10-01 NOTE — Assessment & Plan Note (Signed)
Type 2 diabetes appears to be adequately controlled based on home blood sugar readings with current medication regimen and no adverse side effects. Obtain hemoglobin A1c. Declines Pneumovax. Diabetic foot 9 exams are up-to-date per recommendations. Microalbumin is up-to-date. Maintained on atorvastatin for CAD risk reduction. Continue to monitor blood sugars at home and continue current dosage of metformin, glipizide, and Trulicity pending T6Y results.

## 2016-10-01 NOTE — Progress Notes (Signed)
Subjective:    Patient ID: [name] [name], female    DOB: 01/16/1961, 56 y.o.   MRN: [id number]  Chief Complaint  Patient presents with  . Follow-up    diabetes, wants to know if she still needs to take all of the medication for diabetes or if she can just take trulicity    HPI:  [name] [name] is a 56 y.o. female who  has a past medical history of Abdominal hernia; Abdominal pain; Anemia; Cancer ([place]); Change in bowel movement; Colon cancer ([place]); Complication of anesthesia; Diabetes mellitus without complication ([place]); Headache; and History of rectal bleeding. and presents today for a follow up office visit.  Diabetes - Currently maintained on glipizide, metformin and Trulicity. Reports taking the medication as prescribed and denies adverse side effects or hypoglycemic readings. Blood sugars at home ranging around 110's. Denies new symptoms of end organ damage. No excessive hunger, thirst, or urination. Working on a low/carbohydrate modified oral intake.   Lab Results  Component Value Date   HGBA1C 11.1 06/15/2016     Lab Results  Component Value Date   CREATININE 0.84 11/08/2015   BUN 11 11/08/2015   NA 141 11/08/2015   K 3.8 11/08/2015   CL 107 11/08/2015   CO2 29 11/08/2015      Allergies  Allergen Reactions  . Epinephrine Anaphylaxis  . Lidocaine Anaphylaxis      Outpatient Medications Prior to Visit  Medication Sig Dispense Refill  . atorvastatin (LIPITOR) 20 MG tablet Take 1 tablet (20 mg total) by mouth daily. 90 tablet 1  . Dulaglutide (TRULICITY) 1.5 GY/6.5LD SOPN Inject 1.5 milligram subcutaneously every week NEED OV FOR REFILLS 6 mL 0  . glipiZIDE (GLUCOTROL XL) 5 MG 24 hr tablet Take 1 tablet (5 mg total) by mouth daily with breakfast. 30 tablet 0  . glucose blood test strip Use as instructed 100 each 12  . ibuprofen (ADVIL,MOTRIN) 200 MG tablet Take 400 mg by mouth every 6 (six) hours as needed for mild pain or moderate pain. Reported on 07/12/2015      . Lancets (ONETOUCH ULTRASOFT) lancets Use as instructed 100 each 12  . metFORMIN (GLUCOPHAGE XR) 500 MG 24 hr tablet Take 1 tablet (500 mg total) by mouth daily with breakfast. 180 tablet 0  . metFORMIN (GLUCOPHAGE-XR) 500 MG 24 hr tablet take 2 tablets by mouth once daily with BREAKFAST 60 tablet 1  . Wheat Dextrin (BENEFIBER) POWD Take 1 Dose by mouth daily. 80 g 0   No facility-administered medications prior to visit.      Review of Systems  Constitutional: Negative for chills, fatigue and fever.  Eyes:       Denies changes in vision  Respiratory: Negative for chest tightness and shortness of breath.   Cardiovascular: Negative for chest pain, palpitations and leg swelling.  Endocrine: Negative for polydipsia, polyphagia and polyuria.  Neurological: Negative for numbness.      Objective:    BP 122/82 (BP Location: Left Arm, Patient Position: Sitting, Cuff Size: Large)   Pulse 84   Temp 98.2 F (36.8 C) (Oral)   Resp 16   Ht 5' (1.524 m)   Wt 181 lb (82.1 kg)   SpO2 97%   BMI 35.35 kg/m  Nursing note and vital signs reviewed.  Physical Exam  Constitutional: She is oriented to person, place, and time. She appears well-developed and well-nourished. No distress.  Cardiovascular: Normal rate, regular rhythm, normal heart sounds and intact distal pulses.   Pulmonary/Chest:  Effort normal and breath sounds normal.  Neurological: She is alert and oriented to person, place, and time.  Skin: Skin is warm and dry.  Psychiatric: She has a normal mood and affect. Her behavior is normal. Judgment and thought content normal.       Assessment & Plan:   Problem List Items Addressed This Visit      Endocrine   Type 2 diabetes with complication ([place]) - Primary    Type 2 diabetes appears to be adequately controlled based on home blood sugar readings with current medication regimen and no adverse side effects. Obtain hemoglobin A1c. Declines Pneumovax. Diabetic foot 9 exams are  up-to-date per recommendations. Microalbumin is up-to-date. Maintained on atorvastatin for CAD risk reduction. Continue to monitor blood sugars at home and continue current dosage of metformin, glipizide, and Trulicity pending G3O results.       Relevant Orders   Hemoglobin A1c       I am having [name] [name] maintain her ibuprofen, BENEFIBER, glucose blood, onetouch ultrasoft, metFORMIN, atorvastatin, glipiZIDE, metFORMIN, and Dulaglutide.    Follow-up: Return in about 3 months (around 01/01/2017), or if symptoms worsen or fail to improve.  Mauricio Po, FNP

## 2016-10-01 NOTE — Patient Instructions (Addendum)
Thank you for choosing [facility].  SUMMARY AND INSTRUCTIONS:  [name] [name] job so far!  We will check your A1c today and consider reducing your medications.  Continue to monitor your blood sugars at home.  Labs:  Please stop by the lab on the lower level of the building for your blood work. Your results will be released to [facility] (or called to you) after review, usually within 72 hours after test completion. If any changes need to be made, you will be notified at that same time.  1.) The lab is open from 7:30am to 5:30 pm Monday-Friday 2.) No appointment is necessary 3.) Fasting (if needed) is 6-8 hours after food and drink; black coffee and water are okay   Follow up:  If your symptoms worsen or fail to improve, please contact our office for further instruction, or in case of emergency go directly to the emergency room at the closest medical facility.   DIABETES CARE INSTRUCTIONS:  Current A1c:  Lab Results  Component Value Date   HGBA1C 11.1 06/15/2016    A1C Goal: <7.0%  Fasting Blood Sugar Goal: 80-130 Blood sugar check that you take after fasting for at least 8 hours which is usually in the morning before eating.  Post-prandial Blood Sugar Goal: <180 Blood sugar check approximately 1-2 hours after the start of a meal.   Diabetes Prevention Screens:  1.) Ensure you have an annual eye exam by an ophthalmologist or optometrist.   2,) Ensure you have an annual foot exam or when any changes are noted including new onset numbness/tingling or wounds. Check your feet daily!  3.) The American Diabetes Association recommends the Pneumovax vaccination against pneumonia every 5 years.  4.) We will check your kidney function with a urine test on an annual basis.

## 2016-10-17 ENCOUNTER — Other Ambulatory Visit: Payer: Self-pay | Admitting: Family Medicine

## 2016-10-18 ENCOUNTER — Telehealth: Payer: Self-pay | Admitting: Family

## 2016-10-18 MED ORDER — ATORVASTATIN CALCIUM 20 MG PO TABS: 20.0000 mg | ORAL_TABLET | Freq: Every day | ORAL | 0 refills | Status: DC

## 2016-10-18 NOTE — Telephone Encounter (Signed)
Reviewed chart pt is up-to-date notified [facility].../lmb

## 2016-10-18 NOTE — Telephone Encounter (Signed)
Pt has another Primary care that she goes too and they checked her lipids last year. I will deny med

## 2016-10-18 NOTE — Telephone Encounter (Signed)
Patient [facility] [address]

## 2016-12-05 ENCOUNTER — Telehealth: Payer: Self-pay | Admitting: Family

## 2016-12-05 MED ORDER — ATORVASTATIN CALCIUM 20 MG PO TABS: 20.0000 mg | ORAL_TABLET | Freq: Every day | ORAL | 0 refills | Status: DC

## 2016-12-05 MED ORDER — METFORMIN HCL ER 500 MG PO TB24: 500.0000 mg | ORAL_TABLET | Freq: Every day | ORAL | 0 refills | Status: DC

## 2016-12-05 NOTE — Telephone Encounter (Signed)
Per office policy sent 30 day to local pharmacy until appt.../lmb  

## 2016-12-05 NOTE — Telephone Encounter (Signed)
Pt called requesting a 90 day refill on metFORMIN (GLUCOPHAGE-XR) 500 MG 24 hr tablet and atorvastatin (LIPITOR) [facility] [address]. She has an appointment scheduled with Ashleigh on 01/11/2017 at 2:00pm for her 3 month follow up from [place] and to establish care.

## 2016-12-17 ENCOUNTER — Telehealth: Payer: Self-pay | Admitting: Family

## 2016-12-17 MED ORDER — DULAGLUTIDE 1.5 MG/0.5ML ~~LOC~~ SOAJ: SUBCUTANEOUS | 0 refills | Status: DC

## 2016-12-17 NOTE — Telephone Encounter (Signed)
Called pt inform refill has been sent must keep appt w/new provider for future refills...[name] [name]

## 2016-12-17 NOTE — Telephone Encounter (Signed)
Pt called for a refill of her Dulaglutide (TRULICITY) 1.5 BN/1.2HK SOPN [facility] [address] Please advise

## 2017-01-04 ENCOUNTER — Telehealth: Payer: Self-pay | Admitting: *Deleted

## 2017-01-04 MED ORDER — METFORMIN HCL ER 500 MG PO TB24: ORAL_TABLET | ORAL | 0 refills | Status: DC

## 2017-01-04 NOTE — Telephone Encounter (Signed)
Sent 2 week supply until appt...[name] [name]

## 2017-01-11 ENCOUNTER — Other Ambulatory Visit (INDEPENDENT_AMBULATORY_CARE_PROVIDER_SITE_OTHER): Payer: BLUE CROSS/BLUE SHIELD

## 2017-01-11 ENCOUNTER — Ambulatory Visit: Payer: BLUE CROSS/BLUE SHIELD | Admitting: Nurse Practitioner

## 2017-01-11 ENCOUNTER — Encounter: Payer: Self-pay | Admitting: Nurse Practitioner

## 2017-01-11 VITALS — BP 136/84 | HR 90 | Temp 98.0°F | Resp 16 | Ht 60.0 in | Wt 183.0 lb

## 2017-01-11 DIAGNOSIS — E118 Type 2 diabetes mellitus with unspecified complications: Secondary | ICD-10-CM

## 2017-01-11 LAB — BASIC METABOLIC PANEL
BUN: 12 mg/dL (ref 6–23)
CHLORIDE: 104 meq/L (ref 96–112)
CO2: 28 meq/L (ref 19–32)
CREATININE: 0.85 mg/dL (ref 0.40–1.20)
Calcium: 9.4 mg/dL (ref 8.4–10.5)
GFR: 88.39 mL/min (ref >60.00)
GLUCOSE: 99 mg/dL (ref 70–99)
POTASSIUM: 3.8 meq/L (ref 3.5–5.1)
Sodium: 141 mEq/L (ref 135–145)

## 2017-01-11 LAB — HEMOGLOBIN A1C: Hgb A1c MFr Bld: 6.6 % — ABNORMAL HIGH (ref 4.6–6.5)

## 2017-01-11 NOTE — Progress Notes (Signed)
Subjective:    Patient ID: [name] [name], female    DOB: 1960/12/22, 56 y.o.   MRN: [id number]  HPI [name] [name] is a 56 yo female who presents today to establish care. She s transferring to me from another provider in the same clinic.  Diabetes- maintained on metformin, trulicity. Reports daily medication compliance. She checks her blood sugars daily after breakfast-readings around 110s. She has been actively trying to lose weight and eat better. She avoids sugar and tries to eat 3 balanced meals daily. She walks daily and uses her FitBit to motivate her to take more steps. She has a Brewing technologist at work that has been helping her. She reports she has lost about 20 pounds since last year and feels better. She would like to be able to stop taking her metformin. She wants to continue trulicity as she feels it is helping her lose weight.  Lab Results  Component Value Date   HGBA1C 7.2 (H) 10/01/2016    Review of Systems  See HPI  Past Medical History:  Diagnosis Date  . Abdominal hernia    4 hernias per pt  . Abdominal pain    mid abd pain radiating to right side  . Anemia   . Cancer ([place])    rectal ca  . Change in bowel movement   . Colon cancer ([place])   . Complication of anesthesia    cardiac arrest during nasal surgery  . Diabetes mellitus without complication ([place])   . Headache   . History of rectal bleeding      Social History   Socioeconomic History  . Marital status: Single    Spouse name: Not on file  . Number of children: 3  . Years of education: 22  . Highest education level: Not on file  Social Needs  . Financial resource strain: Not on file  . Food insecurity - worry: Not on file  . Food insecurity - inability: Not on file  . Transportation needs - medical: Not on file  . Transportation needs - non-medical: Not on file  Occupational History  . Occupation: Therapist, art  Tobacco Use  . Smoking status: Former Smoker    Packs/day: 0.25    Years: 13.00     Pack years: 3.25    Types: Cigarettes    Last attempt to quit: 03/14/2003    Years since quitting: 13.8  . Smokeless tobacco: Never Used  Substance and Sexual Activity  . Alcohol use: Yes    Alcohol/week: 0.0 oz    Comment: occasional   . Drug use: No  . Sexual activity: Not Currently  Other Topics Concern  . Not on file  Social History Narrative   Fun: Camping, Cruise    Denies abuse and feels safe at home.     Past Surgical History:  Procedure Laterality Date  . CESAREAN SECTION    . colon cancer    . COLON SURGERY    . COLONOSCOPY    . NOSE SURGERY    . POLYPECTOMY      Family History  Problem Relation Age of Onset  . Colon cancer Maternal Grandmother   . Colon cancer Maternal Aunt   . Uterine cancer Mother     Allergies  Allergen Reactions  . Epinephrine Anaphylaxis  . Lidocaine Anaphylaxis    Current Outpatient Medications on File Prior to Visit  Medication Sig Dispense Refill  . atorvastatin (LIPITOR) 20 MG tablet Take 1 tablet (20 mg total) by mouth  daily. Must keep appt w/new provider for future refill 30 tablet 0  . Dulaglutide (TRULICITY) 1.5 ZO/1.0RU SOPN Inject 1.5 milligram subcutaneously every week Must keep Nov appt w/new provider 6 mL 0  . glucose blood test strip Use as instructed 100 each 12  . ibuprofen (ADVIL,MOTRIN) 200 MG tablet Take 400 mg by mouth every 6 (six) hours as needed for mild pain or moderate pain. Reported on 07/12/2015    . Lancets (ONETOUCH ULTRASOFT) lancets Use as instructed 100 each 12  . metFORMIN (GLUCOPHAGE XR) 500 MG 24 hr tablet Take 1 tablet (500 mg total) by mouth daily with breakfast. Must keep appt w/new provider for future refill 60 tablet 0  . Wheat Dextrin (BENEFIBER) POWD Take 1 Dose by mouth daily. 80 g 0   No current facility-administered medications on file prior to visit.     BP 136/84 (BP Location: Left Arm, Patient Position: Sitting, Cuff Size: Large)   Pulse 90   Temp 98 F (36.7 C) (Oral)   Resp 16    Ht 5' (1.524 m)   Wt 183 lb (83 kg)   SpO2 97%   BMI 35.74 kg/m      Objective:   Physical Exam  Constitutional: She is oriented to person, place, and time. She appears well-developed and well-nourished. No distress.  HENT:  Head: Normocephalic and atraumatic.  Cardiovascular: Normal rate, regular rhythm, normal heart sounds and intact distal pulses.  Pulmonary/Chest: Effort normal and breath sounds normal.  Neurological: She is alert and oriented to person, place, and time. Coordination normal.  Skin: Skin is warm and dry.  Psychiatric: She has a normal mood and affect. Judgment and thought content normal.      Assessment & Plan:

## 2017-01-11 NOTE — Patient Instructions (Addendum)
Please head downstairs for lab work.  If you would like to stop your metformin I am okay with that. Just remember you must keep up your good work with diet and exercise.  Continue to check your blood sugar once daily or at least a few times a week, when you are fasting in the morning. Our goal is daily fasting readings between 126 and 154 to keep your A1c between 6% and 7%.  Id like to see you back in 3 months to see how you are doing, or sooner if you need me.  It was nice to meet you. Thanks for letting me take care of you today :)

## 2017-01-11 NOTE — Assessment & Plan Note (Signed)
Maintained on metformin, trulicity. Reports medication compliance. She is actively losing weight and exercising. Daily blood sugar readings, after breakfast, have been in 110s. She would like to discontinue metformin today. She will continue trulicity. She will monitor blood sugars at home with daily goal of fasting blood sugars between 126 and 154. BMET, hemoglobin A1c today. Return in 3 months for F/U

## 2017-01-16 ENCOUNTER — Other Ambulatory Visit: Payer: Self-pay | Admitting: *Deleted

## 2017-01-16 MED ORDER — ATORVASTATIN CALCIUM 20 MG PO TABS: 20.0000 mg | ORAL_TABLET | Freq: Every day | ORAL | 1 refills | Status: DC

## 2017-01-18 ENCOUNTER — Other Ambulatory Visit: Payer: Self-pay

## 2017-01-18 MED ORDER — METFORMIN HCL ER 500 MG PO TB24: ORAL_TABLET | ORAL | 0 refills | Status: DC

## 2017-03-25 ENCOUNTER — Other Ambulatory Visit: Payer: Self-pay | Admitting: Nurse Practitioner

## 2017-04-04 ENCOUNTER — Other Ambulatory Visit: Payer: Self-pay

## 2017-04-04 MED ORDER — DULAGLUTIDE 1.5 MG/0.5ML ~~LOC~~ SOAJ: SUBCUTANEOUS | 1 refills | Status: DC

## 2017-04-26 ENCOUNTER — Other Ambulatory Visit: Payer: Self-pay | Admitting: Nurse Practitioner

## 2017-06-05 ENCOUNTER — Other Ambulatory Visit: Payer: Self-pay | Admitting: Family

## 2017-06-05 ENCOUNTER — Other Ambulatory Visit (INDEPENDENT_AMBULATORY_CARE_PROVIDER_SITE_OTHER): Payer: BLUE CROSS/BLUE SHIELD

## 2017-06-05 ENCOUNTER — Encounter: Payer: Self-pay | Admitting: Family

## 2017-06-05 ENCOUNTER — Ambulatory Visit (INDEPENDENT_AMBULATORY_CARE_PROVIDER_SITE_OTHER)
Admission: RE | Admit: 2017-06-05 | Discharge: 2017-06-05 | Disposition: A | Discharge status: Home / Self Care | Payer: BLUE CROSS/BLUE SHIELD | Source: Ambulatory Visit | Attending: Family | Admitting: Family

## 2017-06-05 ENCOUNTER — Ambulatory Visit: Payer: BLUE CROSS/BLUE SHIELD | Admitting: Family

## 2017-06-05 VITALS — BP 130/88 | HR 84 | Temp 98.2°F | Ht 60.0 in | Wt 194.1 lb

## 2017-06-05 DIAGNOSIS — R079 Chest pain, unspecified: Secondary | ICD-10-CM

## 2017-06-05 DIAGNOSIS — M25569 Pain in unspecified knee: Secondary | ICD-10-CM

## 2017-06-05 DIAGNOSIS — M25562 Pain in left knee: Secondary | ICD-10-CM

## 2017-06-05 DIAGNOSIS — E118 Type 2 diabetes mellitus with unspecified complications: Secondary | ICD-10-CM | POA: Diagnosis not present

## 2017-06-05 DIAGNOSIS — R030 Elevated blood-pressure reading, without diagnosis of hypertension: Secondary | ICD-10-CM

## 2017-06-05 DIAGNOSIS — R252 Cramp and spasm: Secondary | ICD-10-CM

## 2017-06-05 DIAGNOSIS — R140 Abdominal distension (gaseous): Secondary | ICD-10-CM | POA: Diagnosis not present

## 2017-06-05 LAB — COMPREHENSIVE METABOLIC PANEL
ALT: 17 U/L (ref 0–35)
AST: 18 U/L (ref 0–37)
Albumin: 4.1 g/dL (ref 3.5–5.2)
Alkaline Phosphatase: 65 U/L (ref 39–117)
BILIRUBIN TOTAL: 0.3 mg/dL (ref 0.2–1.2)
BUN: 12 mg/dL (ref 6–23)
CO2: 30 meq/L (ref 19–32)
CREATININE: 0.79 mg/dL (ref 0.40–1.20)
Calcium: 9.2 mg/dL (ref 8.4–10.5)
Chloride: 104 mEq/L (ref 96–112)
GFR: 96.05 mL/min (ref >60.00)
Glucose, Bld: 97 mg/dL (ref 70–99)
Potassium: 4 mEq/L (ref 3.5–5.1)
Sodium: 141 mEq/L (ref 135–145)
Total Protein: 7.1 g/dL (ref 6.0–8.3)

## 2017-06-05 LAB — CBC WITH DIFFERENTIAL/PLATELET
BASOS ABS: 0 10*3/uL (ref 0.0–0.1)
Basophils Relative: 1 % (ref 0.0–3.0)
EOS ABS: 0.2 10*3/uL (ref 0.0–0.7)
Eosinophils Relative: 3.6 % (ref 0.0–5.0)
HCT: 38.8 % (ref 36.0–46.0)
Hemoglobin: 12.2 g/dL (ref 12.0–15.0)
LYMPHS ABS: 2.1 10*3/uL (ref 0.7–4.0)
Lymphocytes Relative: 49.2 % — ABNORMAL HIGH (ref 12.0–46.0)
MCHC: 31.3 g/dL (ref 30.0–36.0)
MONO ABS: 0.4 10*3/uL (ref 0.1–1.0)
Monocytes Relative: 10 % (ref 3.0–12.0)
NEUTROS ABS: 1.6 10*3/uL (ref 1.4–7.7)
NEUTROS PCT: 36.2 % — AB (ref 43.0–77.0)
PLATELETS: 264 10*3/uL (ref 150.0–400.0)
RBC: 5.54 Mil/uL — ABNORMAL HIGH (ref 3.87–5.11)
RDW: 14.8 % (ref 11.5–15.5)
WBC: 4.4 10*3/uL (ref 4.0–10.5)

## 2017-06-05 LAB — TSH: TSH: 1.69 u[IU]/mL (ref 0.35–4.50)

## 2017-06-05 LAB — VITAMIN B12: Vitamin B-12: 291 pg/mL (ref 211–911)

## 2017-06-05 LAB — HEMOGLOBIN A1C: Hgb A1c MFr Bld: 7.3 % — ABNORMAL HIGH (ref 4.6–6.5)

## 2017-06-05 LAB — MAGNESIUM: Magnesium: 1.9 mg/dL (ref 1.5–2.5)

## 2017-06-05 IMAGING — DX DG KNEE 1-2V*L*
2 series · 2 of 2 positions shown · non-contrast
Comparison: None.

CLINICAL DATA: Left knee pain worsening over the last 2 weeks, no
injury

EXAM:
LEFT KNEE - 1-2 VIEW

[knee ap]
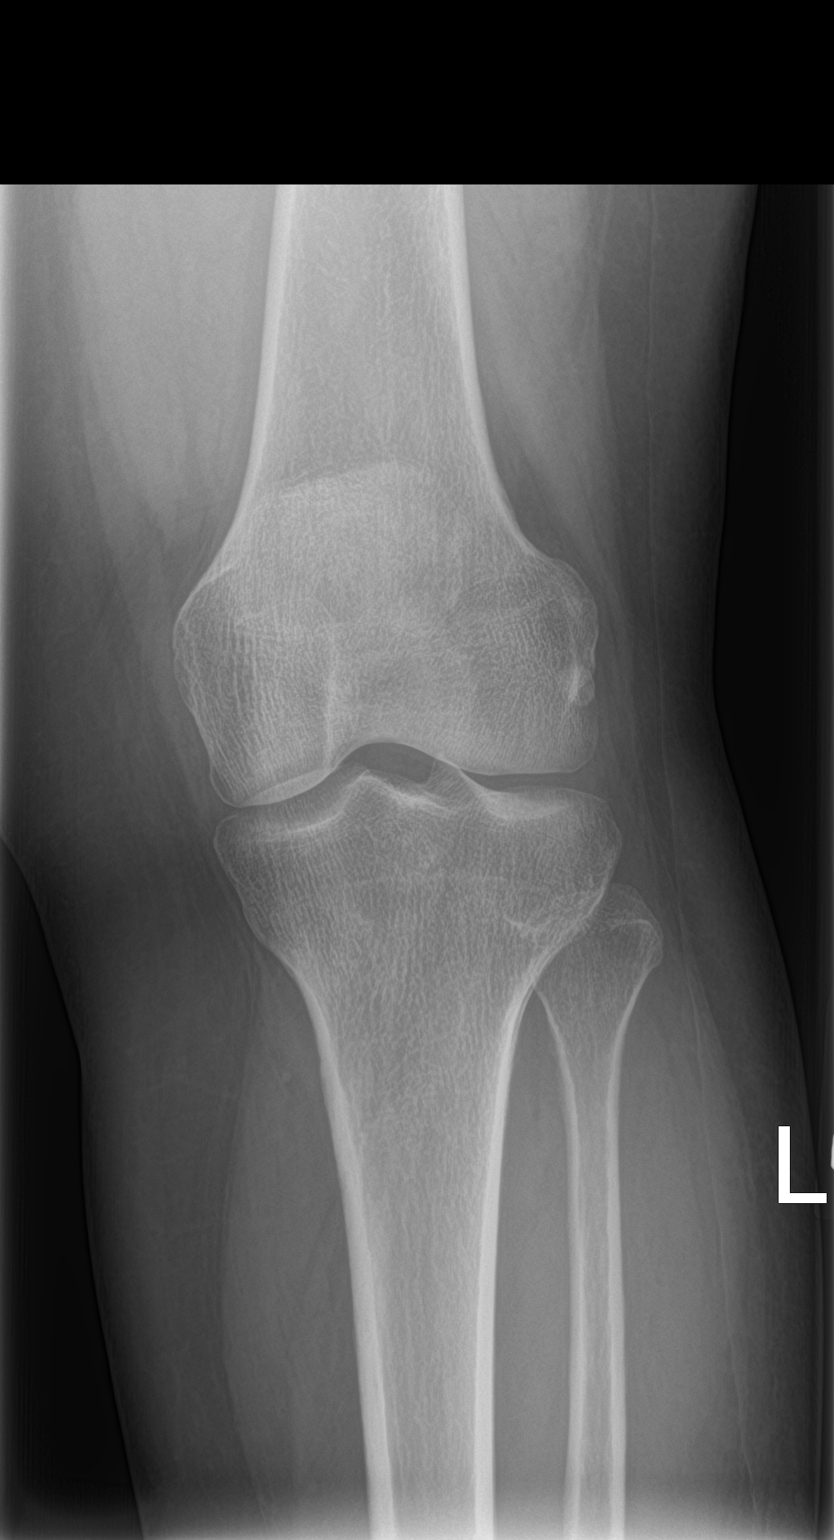

[knee lat]
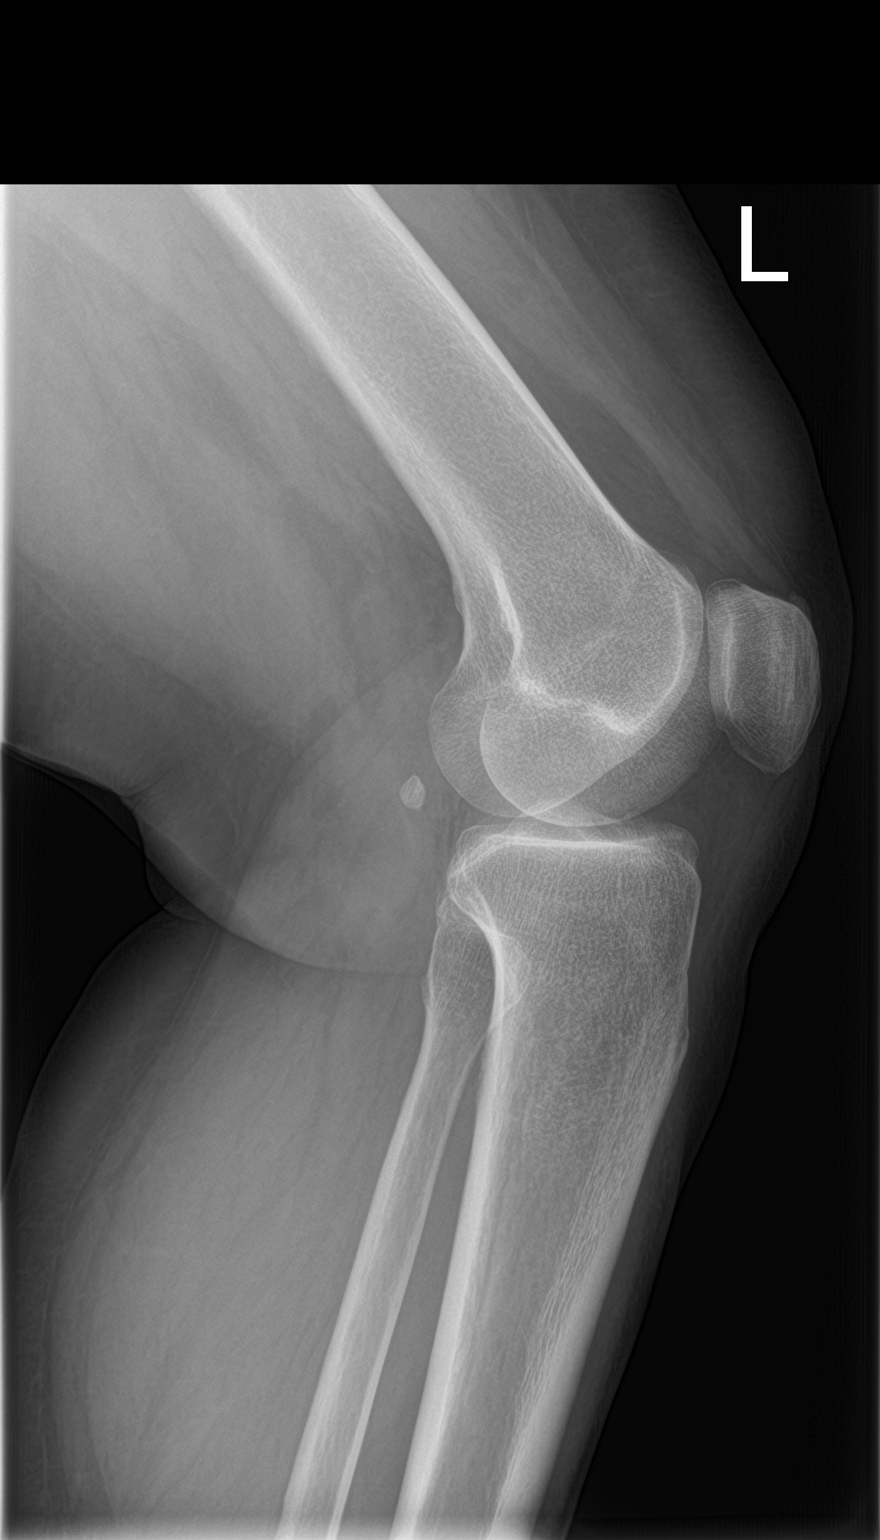

[2 of 2 positions shown; findings below may reference images not displayed]

FINDINGS: No significant degenerative joint disease of the left knee is seen.
Joint spaces appear relatively well preserved. No fracture is noted
and there is no evidence of joint effusion.
IMPRESSION: Negative.

## 2017-06-05 NOTE — Progress Notes (Signed)
[name] [name] is a 57 y.o. female with the following history as recorded in Epic[facility]:  Patient Active Problem List   Diagnosis Date Noted  . Urinary frequency 06/15/2016  . Type 2 diabetes with complication ([place]) 94/09/6806  . Hyperlipidemia associated with type 2 diabetes mellitus ([place]) 12/30/2012  . Obesity (BMI 30-39.9) 12/23/2012  . Colon cancer ([place]) 10/31/2011    Current Outpatient Medications  Medication Sig Dispense Refill  . atorvastatin (LIPITOR) 20 MG tablet Take 1 tablet (20 mg total) daily by mouth. Must keep appt w/new provider for future refill 90 tablet 1  . Biotin 1 MG CAPS Take by mouth.    . Dulaglutide (TRULICITY) 1.5 UP/1.0RP SOPN Inject 1.5 milligram subcutaneously every week. 12 pen 1  . glucose blood test strip Use as instructed 100 each 12  . ibuprofen (ADVIL,MOTRIN) 200 MG tablet Take 400 mg by mouth every 6 (six) hours as needed for mild pain or moderate pain. Reported on 07/12/2015    . Lancets (ONETOUCH ULTRASOFT) lancets Use as instructed 100 each 12  . metFORMIN (GLUCOPHAGE-XR) 500 MG 24 hr tablet TAKE 2 TABLETS BY MOUTH ONCE DAILY WITH BREAKFAST. 180 tablet 0  . Multiple Vitamin (MULTIVITAMIN WITH MINERALS) TABS tablet Take 1 tablet by mouth daily.     No current facility-administered medications for this visit.     Allergies: Epinephrine and Lidocaine  Past Medical History:  Diagnosis Date  . Abdominal hernia    4 hernias per pt  . Abdominal pain    mid abd pain radiating to right side  . Anemia   . Cancer ([place])    rectal ca  . Change in bowel movement   . Colon cancer ([place])   . Complication of anesthesia    cardiac arrest during nasal surgery  . Diabetes mellitus without complication ([place])   . Headache   . History of rectal bleeding     Past Surgical History:  Procedure Laterality Date  . CESAREAN SECTION    . colon cancer    . COLON SURGERY    . COLONOSCOPY    . LAPAROSCOPIC LYSIS OF ADHESIONS  04/02/2014   Procedure: LAPAROSCOPIC  LYSIS OF ADHESIONS;  Surgeon: Jackolyn Confer, MD;  Location: [facility];  Service: General;;  . NOSE SURGERY    . POLYPECTOMY    . VENTRAL HERNIA REPAIR N/A 04/02/2014   Procedure: LAPAROSCOPIC  LYSIS OF ADHESIONS AND REPAIR OF VENTRAL INCISIONAL HERNIAS WITH MESH;  Surgeon: Jackolyn Confer, MD;  Location: [facility];  Service: General;  Laterality: N/A;    Family History  Problem Relation Age of Onset  . Colon cancer Maternal Grandmother   . Colon cancer Maternal Aunt   . Uterine cancer Mother     Social History   Tobacco Use  . Smoking status: Former Smoker    Packs/day: 0.25    Years: 13.00    Pack years: 3.25    Types: Cigarettes    Last attempt to quit: 03/14/2003    Years since quitting: 14.2  . Smokeless tobacco: Never Used  Substance Use Topics  . Alcohol use: Yes    Alcohol/week: 0.0 oz    Comment: occasional     Subjective:  Patient presents with numerous concerns:  1) Left knee pain x "months." No known injury or trauma; feels like there is pressure on knee when bending to get in the knee in bed; denies any swelling;  2) Woke up this morning with sense of "tightness" through left-sided upper chest/ neck;  denies any shortness of breath or chest pain on exertion; some pain with moving neck toward her left shoulder; denies any numbness or tingling radiating into her fingers; does have occasional episodes of cramps in her left hand- feels that cramps only occur with long periods of driving; notes that her hand "can look like a claw." 3) Type 2 Diabetes- does not take Metformin regularly; uses as needed when her blood sugar is "high." Does take her Trulicity regularly;   4) Is concerned about "swelling" through her lower abdomen; feels that abdomen/ pelvis stay distended; is a colon cancer survivor; has history of hernia and mesh in place- concerned that could be complication with hernia repair.   Objective:  Vitals:   06/05/17 1527  BP: 130/88  Pulse: 84  Temp: 98.2 F (36.8 C)   TempSrc: Oral  SpO2: 96%  Weight: 194 lb 1.3 oz (88 kg)  Height: 5' (1.524 m)    General: Well developed, well nourished, in no acute distress  Skin : Warm and dry.  Head: Normocephalic and atraumatic  Eyes: Sclera and conjunctiva clear; pupils round and reactive to light; extraocular movements intact  Lungs: Respirations unlabored; clear to auscultation bilaterally without wheeze, rales, rhonchi  CVS exam: normal rate and regular rhythm.  Abdomen: Soft; nontender; nondistended; normoactive bowel sounds; no masses or hepatosplenomegaly  Musculoskeletal: No deformities; no active joint inflammation  Extremities: No edema, cyanosis, clubbing  Vessels: Symmetric bilaterally  Neurologic: Alert and oriented; speech intact; face symmetrical; moves all extremities well; CNII-XII intact without focal deficit   Assessment:  1. Chest pain, unspecified type   2. Muscle cramps   3. Type 2 diabetes mellitus with complication, without long-term current use of insulin ([facility])   4. Abdominal distension (gaseous)   5. Knee pain, unspecified chronicity, unspecified laterality   6. Elevated blood pressure reading     Plan:  1. Atypical; suspect muscular as patient's chest wall tender to touch and pain more pronounced when she moves her neck; EKG shows no acute ischemic changes;  2. Update labs; follow-up to be determined; 3. Check Hgba1c as patient is only taking Trulicity weekly and Metformin prn. 4. Update abdomen/ pelvic CT; follow-up to be determined; 5. Update x-rays; follow-up to be determined.  6. Patient is encouraged to check her blood pressure 2-3 x per week over the 2 weeks; forward those results for review to her PCP; may need to consider low dose blood pressure medication.   No follow-ups on file.  Orders Placed This Encounter  Procedures  . CT Abdomen Pelvis W Contrast    Standing Status:   Future    Standing Expiration Date:   09/05/2018    Order Specific Question:   If indicated for  the ordered procedure, I authorize the administration of contrast media per Radiology protocol    Answer:   Yes    Order Specific Question:   Is patient pregnant?    Answer:   No    Order Specific Question:   Preferred imaging location?    Answer:   [address]    Order Specific Question:   Radiology Contrast Protocol - do NOT remove file path    Answer:   [email or web address]  . B12    Standing Status:   Future    Number of Occurrences:   1    Standing Expiration Date:   06/05/2018  . Comp Met (CMET)    Standing Status:   Future    Number  of Occurrences:   1    Standing Expiration Date:   06/05/2018  . TSH    Standing Status:   Future    Number of Occurrences:   1    Standing Expiration Date:   06/05/2018  . CBC w/Diff    Standing Status:   Future    Number of Occurrences:   1    Standing Expiration Date:   06/05/2018  . Magnesium    Standing Status:   Future    Number of Occurrences:   1    Standing Expiration Date:   06/05/2018  . HgB A1c    Standing Status:   Future    Number of Occurrences:   1    Standing Expiration Date:   06/05/2018  . EKG 12-Lead    Requested Prescriptions    No prescriptions requested or ordered in this encounter

## 2017-06-18 ENCOUNTER — Ambulatory Visit (INDEPENDENT_AMBULATORY_CARE_PROVIDER_SITE_OTHER)
Admission: RE | Admit: 2017-06-18 | Discharge: 2017-06-18 | Disposition: A | Discharge status: Home / Self Care | Payer: BLUE CROSS/BLUE SHIELD | Source: Ambulatory Visit | Attending: Family | Admitting: Family

## 2017-06-18 DIAGNOSIS — R140 Abdominal distension (gaseous): Secondary | ICD-10-CM

## 2017-06-18 IMAGING — CT CT ABD-PELV W/ CM
2 of 5 series · 16 of 46 positions shown, 18 images · IV contrast (ISOVUE 300)
Comparison: CT abdomen pelvis [DATE]

CLINICAL DATA: Abdominal distention, abdominal outpouchings,
evaluate for possible hernia, history of prior hernia repairs, also
history of partial colectomy for colon carcinoma in [XR]

EXAM:
CT ABDOMEN AND PELVIS WITH CONTRAST
TECHNIQUE: Multidetector CT imaging of the abdomen and pelvis was performed
using the standard protocol following bolus administration of
intravenous contrast.
CONTRAST:  100mL [XR] IOPAMIDOL ([XR]) INJECTION 61%

[Series 2: abd/pel w · axial · 0.73mm/px · z∈[-404,-9]mm · 13 of 89 slices shown, 15 images]
[im 5/89  soft-tissue]
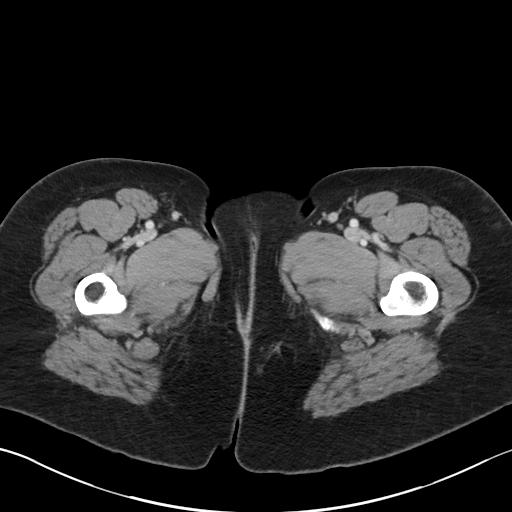
[im 5/89  bone]
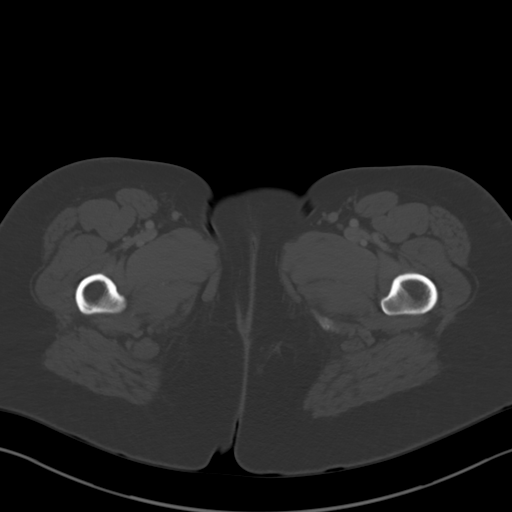
[im 14/89  soft-tissue]
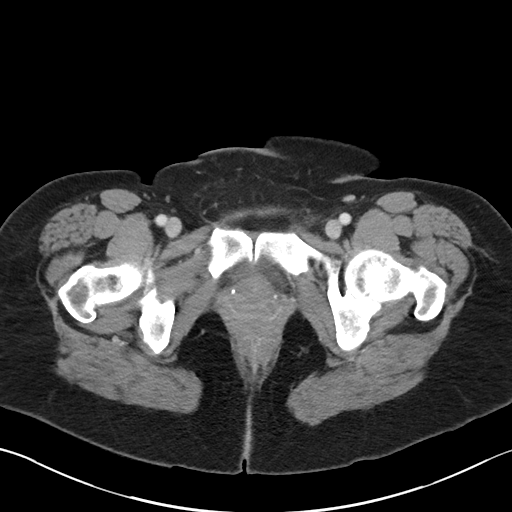
[im 19/89  soft-tissue]
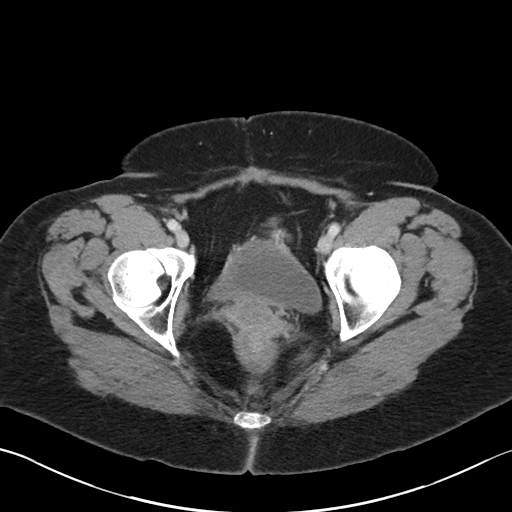
[im 24/89  soft-tissue]
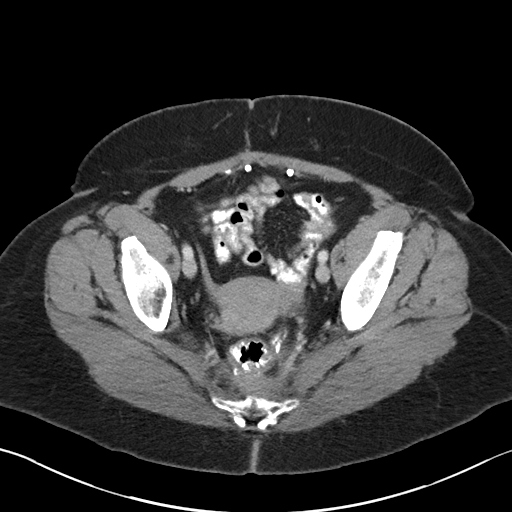
[im 33/89  soft-tissue]
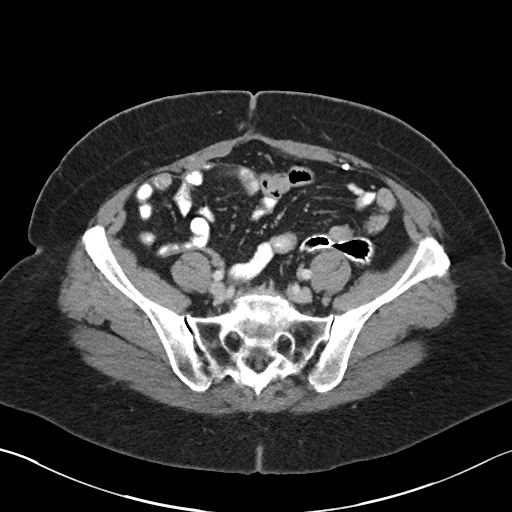
[im 38/89  soft-tissue]
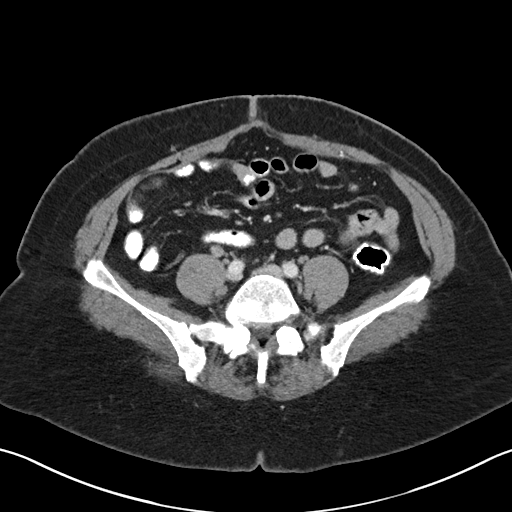
[im 47/89  soft-tissue]
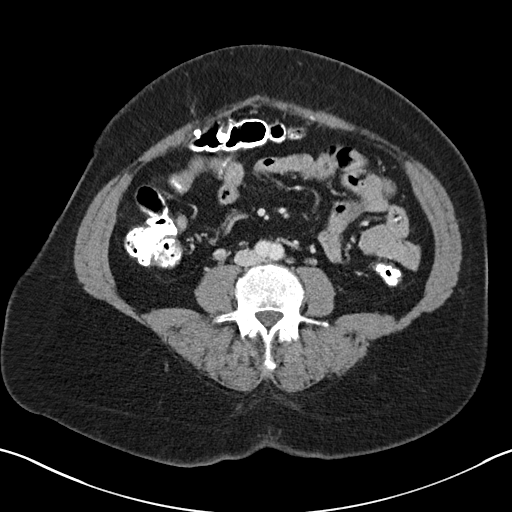
[im 51/89  soft-tissue]
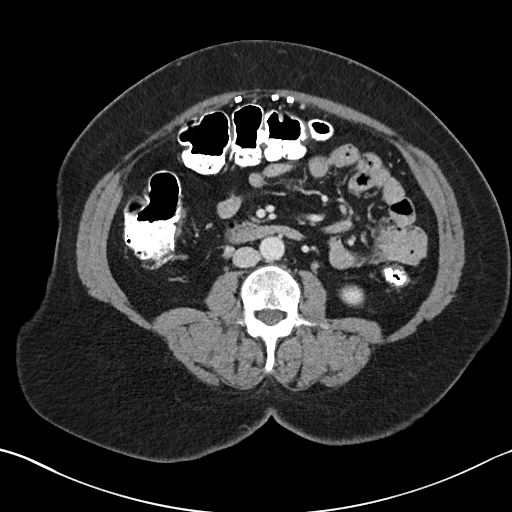
[im 56/89  soft-tissue]
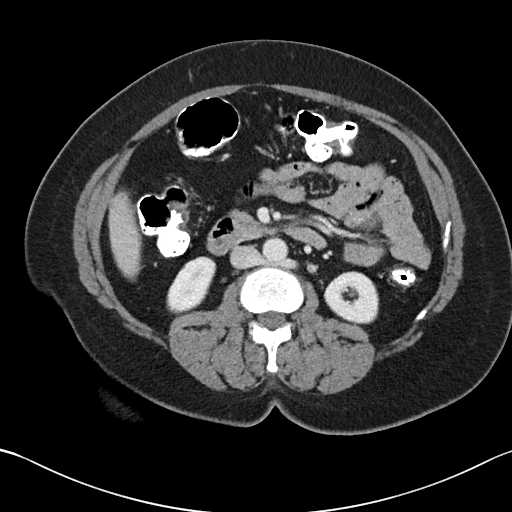
[im 56/89  bone]
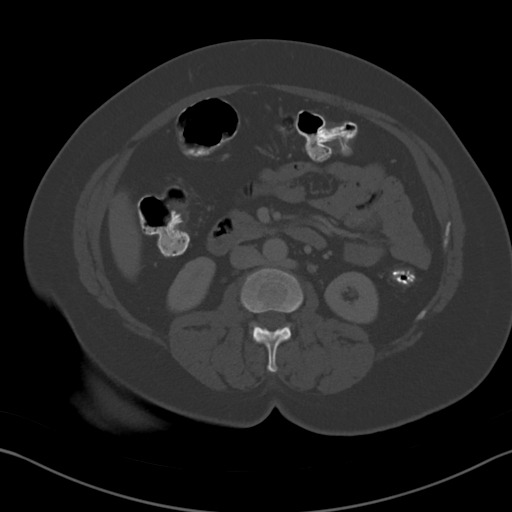
[im 65/89  soft-tissue]
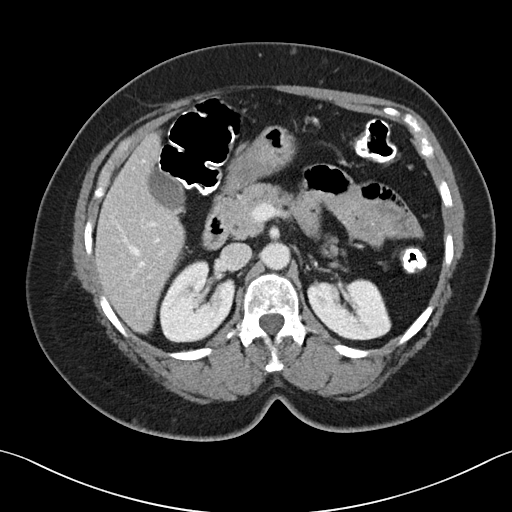
[im 70/89  soft-tissue]
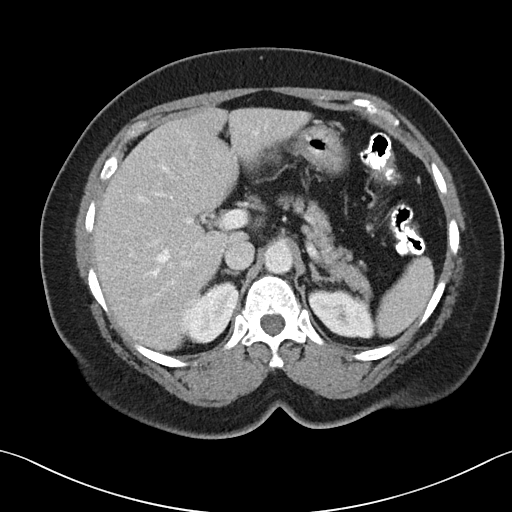
[im 75/89  soft-tissue]
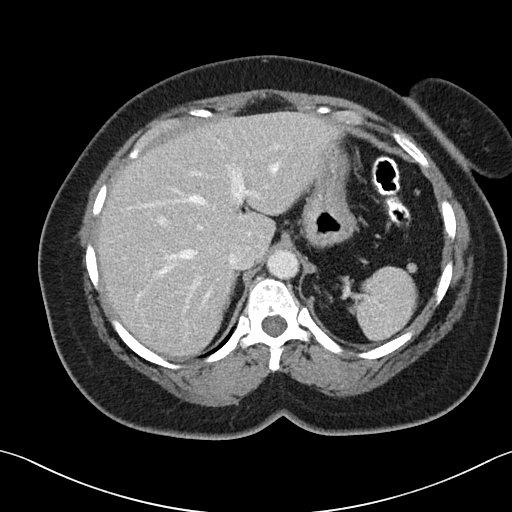
[im 84/89  soft-tissue]
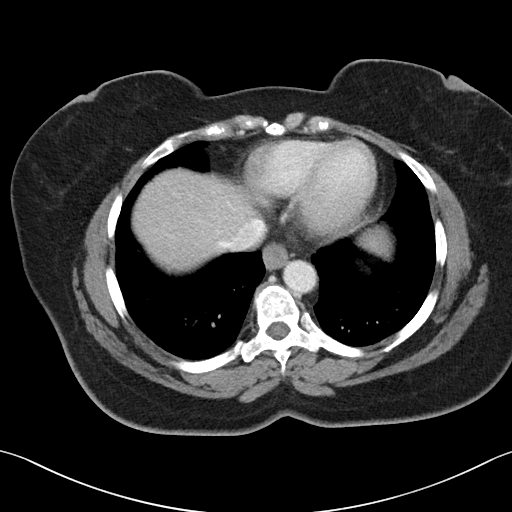

[Series 6: abd/pel w st · coronal · 0.68mm/px · 3 of 86 slices shown]
[im 29/86  soft-tissue]
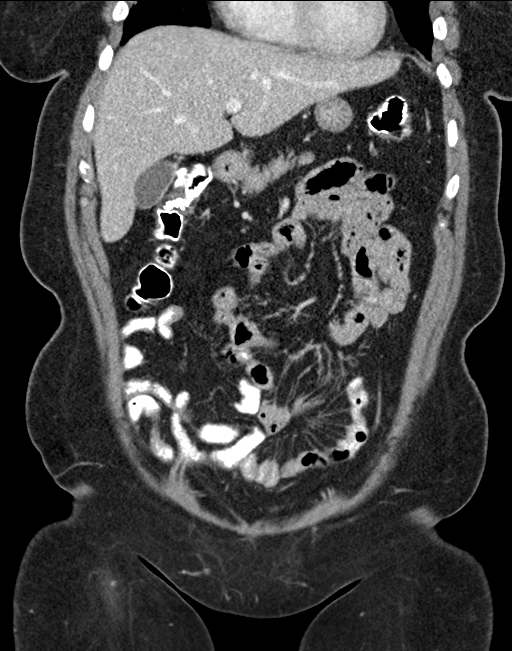
[im 38/86  soft-tissue]
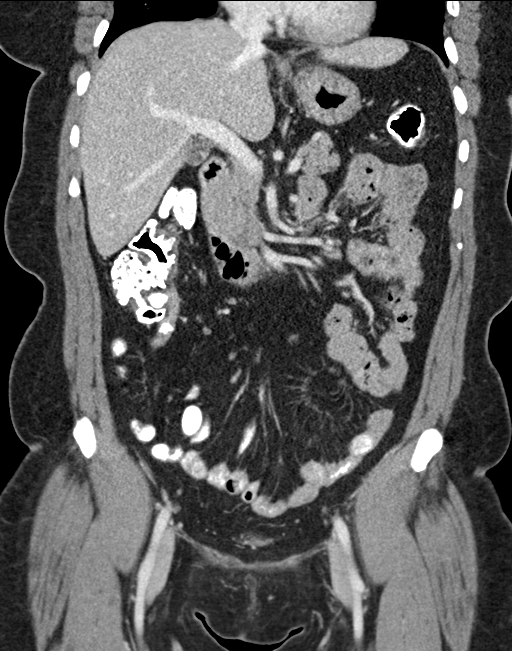
[im 48/86  soft-tissue]
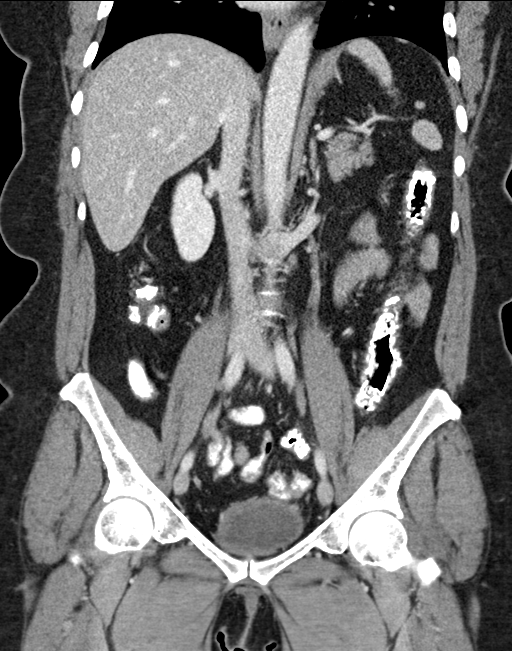

[16 of 46 positions shown; findings below may reference images not displayed]

FINDINGS: Lower chest: The lung bases are clear. The heart is within normal
limits in size.

Hepatobiliary: The liver enhances and no focal hepatic abnormality
is seen. No calcified gallstones are noted within the gallbladder.

Pancreas: The pancreas is normal in size and the pancreatic duct is
not dilated.

Spleen: The spleen is unremarkable.

Adrenals/Urinary Tract: The adrenal glands appear normal. The
kidneys enhance with no calculus or mass, and on delayed images the
pelvocaliceal systems are unremarkable. The ureters are normal in
caliber. The urinary bladder is not well distended but no
abnormality is seen.

Stomach/Bowel: The stomach is not well distended and cannot be
evaluated. No small bowel distention is seen. There is contrast
scattered throughout the nondistended colon. The appendix and
terminal ileum are unremarkable.

Vascular/Lymphatic: The abdominal aorta is normal caliber. No
adenopathy is seen.

Reproductive: The uterus is normal in size. No adnexal lesion is
seen. No free fluid is noted within the pelvis.

Other: Mesh is noted overlying the mid lower abdomen and the mid
pelvis prior hernia repair. No present abdominal wall hernia is
detected. A small soft tissue nodule is noted along the anterior
abdominal wall and peritoneal cavity on image 32 series 2 probably
due to scarring from prior placement mesh.

Musculoskeletal: The lumbar vertebrae are in normal alignment.
Calcified disc is noted at L5-S1 but intervertebral disc spaces
appear normal.
IMPRESSION: 1. No present abdominal wall hernia is seen. Mesh is noted in the
mid lower abdomen and mid pelvis from prior repair of hernia.
2. No abdominal mass or cyst is seen.

## 2017-06-18 MED ORDER — IOPAMIDOL (ISOVUE-300) INJECTION 61%
100.0000 mL | Freq: Once | INTRAVENOUS | Status: AC | PRN
  Administered 2017-06-18: 100 mL via INTRAVENOUS

## 2017-06-19 ENCOUNTER — Other Ambulatory Visit: Payer: Self-pay | Admitting: Family

## 2017-06-24 ENCOUNTER — Other Ambulatory Visit: Payer: Self-pay | Admitting: Family

## 2017-08-21 ENCOUNTER — Other Ambulatory Visit: Payer: Self-pay | Admitting: Nurse Practitioner

## 2017-09-19 ENCOUNTER — Telehealth: Payer: Self-pay | Admitting: Nurse Practitioner

## 2017-09-19 NOTE — Telephone Encounter (Signed)
Copied from [place] [phone]. Topic: Quick Communication - See Telephone Encounter >> Sep 19, 2017  3:28 PM Ivar Drape wrote: CRM for notification. See Telephone encounter for: 09/19/17. Taisa w/[facility] [phone] Ref # [id number] needs a verbal order for a 90day supply of Dulaglutide (TRULICITY) 1.5 YN/1.8ZF SOPN. This is the last time they are going to request this information.  The prescription will be put on hold if they don't hear from the office.

## 2017-09-20 MED ORDER — DULAGLUTIDE 1.5 MG/0.5ML ~~LOC~~ SOAJ: SUBCUTANEOUS | 1 refills | Status: DC

## 2017-09-20 NOTE — Telephone Encounter (Signed)
Spoke with [facility]. The script on file was expired and a new one was needed to be sent in for medication to be sent out to the patient.   New script sent.

## 2017-09-20 NOTE — Telephone Encounter (Signed)
Pt is due for a follow up with Ashleigh for DM management.  I will send rx in once pt has made an appt.   Called pt and scheduled follow up. Pt stated that she has already picked [facility].

## 2017-09-20 NOTE — Telephone Encounter (Signed)
[facility] is calling for a verbal order- they want to fill a 90 day supply of Trulicity for patient. Please contact: Marilynne Halsted [phone] Ref# [id number]

## 2017-10-10 ENCOUNTER — Ambulatory Visit: Payer: BLUE CROSS/BLUE SHIELD | Admitting: Nurse Practitioner

## 2017-10-10 ENCOUNTER — Encounter: Payer: Self-pay | Admitting: Nurse Practitioner

## 2017-10-10 ENCOUNTER — Other Ambulatory Visit (INDEPENDENT_AMBULATORY_CARE_PROVIDER_SITE_OTHER): Payer: BLUE CROSS/BLUE SHIELD

## 2017-10-10 VITALS — BP 126/82 | HR 88 | Ht 60.0 in | Wt 187.0 lb

## 2017-10-10 DIAGNOSIS — E1169 Type 2 diabetes mellitus with other specified complication: Secondary | ICD-10-CM

## 2017-10-10 DIAGNOSIS — E118 Type 2 diabetes mellitus with unspecified complications: Secondary | ICD-10-CM

## 2017-10-10 DIAGNOSIS — Z1239 Encounter for other screening for malignant neoplasm of breast: Secondary | ICD-10-CM

## 2017-10-10 DIAGNOSIS — Z23 Encounter for immunization: Secondary | ICD-10-CM | POA: Diagnosis not present

## 2017-10-10 DIAGNOSIS — Z0000 Encounter for general adult medical examination without abnormal findings: Secondary | ICD-10-CM | POA: Diagnosis not present

## 2017-10-10 DIAGNOSIS — E785 Hyperlipidemia, unspecified: Secondary | ICD-10-CM

## 2017-10-10 DIAGNOSIS — Z1231 Encounter for screening mammogram for malignant neoplasm of breast: Secondary | ICD-10-CM

## 2017-10-10 LAB — LIPID PANEL
CHOL/HDL RATIO: 4
Cholesterol: 121 mg/dL (ref 0–200)
HDL: 34.6 mg/dL — AB (ref >39.00)
LDL CALC: 47 mg/dL (ref 0–99)
NonHDL: 86.75
TRIGLYCERIDES: 197 mg/dL — AB (ref 0.0–149.0)
VLDL: 39.4 mg/dL (ref 0.0–40.0)

## 2017-10-10 LAB — HEMOGLOBIN A1C: Hgb A1c MFr Bld: 6.6 % — ABNORMAL HIGH (ref 4.6–6.5)

## 2017-10-10 LAB — MICROALBUMIN / CREATININE URINE RATIO
Creatinine,U: 204.7 mg/dL
MICROALB UR: 2 mg/dL — AB (ref 0.0–1.9)
Microalb Creat Ratio: 1 mg/g (ref 0.0–30.0)

## 2017-10-10 NOTE — Assessment & Plan Note (Addendum)
Continue atorvastatin Update labs- she is not fasting- hot dog and fries for lunch- prefers labs to be drawn today for convenience as is hard to get off work to come back for labs F/U with further recommendations pending lab results - Lipid panel; Future

## 2017-10-10 NOTE — Progress Notes (Signed)
Name: [name] [name]   MRN: [id number]    DOB: [birth year]   Date:10/10/2017       Progress Note  Subjective   HPI [name] is here today for a diabetes follow up visit. We will also follow up on HLD and routine HM. Overall feels well today and denies any complaints  Diabetes- maintained on trulicity 1.5 weekly and was instructed to resume metformin 1000 daily after elevated A1c in April. She reports daily medication compliance without noted adverse medication effects. Reports routinely monitoring blood sugars at home, recent readings 120s-130s Denies tremor, diaphoresis, polyuria, polydipsia, polyphagia.  Lab Results  Component Value Date   HGBA1C 7.3 (H) 06/05/2017   Cholesterol- maintained on atorvastatin 20 daily Reports daily medication compliance without noted adverse medication effects including myalgia, nausea  Lab Results  Component Value Date   CHOL 143 11/08/2015   HDL 34.70 (L) 11/08/2015   LDLCALC 79 11/08/2015   TRIG 148.0 11/08/2015   CHOLHDL 4 11/08/2015    Patient Active Problem List   Diagnosis Date Noted  . Urinary frequency 06/15/2016  . Type 2 diabetes with complication ([place]) 51/88/4166  . Hyperlipidemia associated with type 2 diabetes mellitus ([place]) 12/30/2012  . Obesity (BMI 30-39.9) 12/23/2012  . Colon cancer ([place]) 10/31/2011    Past Surgical History:  Procedure Laterality Date  . CESAREAN SECTION    . colon cancer    . COLON SURGERY    . COLONOSCOPY    . LAPAROSCOPIC LYSIS OF ADHESIONS  04/02/2014   Procedure: LAPAROSCOPIC LYSIS OF ADHESIONS;  Surgeon: Jackolyn Confer, MD;  Location: [facility];  Service: General;;  . NOSE SURGERY    . POLYPECTOMY    . VENTRAL HERNIA REPAIR N/A 04/02/2014   Procedure: LAPAROSCOPIC  LYSIS OF ADHESIONS AND REPAIR OF VENTRAL INCISIONAL HERNIAS WITH MESH;  Surgeon: Jackolyn Confer, MD;  Location: [facility];  Service: General;  Laterality: N/A;    Family History  Problem Relation Age of Onset  . Colon cancer Maternal  Grandmother   . Colon cancer Maternal Aunt   . Uterine cancer Mother     Social History   Socioeconomic History  . Marital status: Single    Spouse name: Not on file  . Number of children: 3  . Years of education: 61  . Highest education level: Not on file  Occupational History  . Occupation: Therapist, art  Social Needs  . Financial resource strain: Not on file  . Food insecurity:    Worry: Not on file    Inability: Not on file  . Transportation needs:    Medical: Not on file    Non-medical: Not on file  Tobacco Use  . Smoking status: Former Smoker    Packs/day: 0.25    Years: 13.00    Pack years: 3.25    Types: Cigarettes    Last attempt to quit: 03/14/2003    Years since quitting: 14.5  . Smokeless tobacco: Never Used  Substance and Sexual Activity  . Alcohol use: Yes    Alcohol/week: 0.0 standard drinks    Comment: occasional   . Drug use: No  . Sexual activity: Not Currently  Lifestyle  . Physical activity:    Days per week: Not on file    Minutes per session: Not on file  . Stress: Not on file  Relationships  . Social connections:    Talks on phone: Not on file    Gets together: Not on file    Attends religious service:  Not on file    Active member of club or organization: Not on file    Attends meetings of clubs or organizations: Not on file    Relationship status: Not on file  . Intimate partner violence:    Fear of current or ex partner: Not on file    Emotionally abused: Not on file    Physically abused: Not on file    Forced sexual activity: Not on file  Other Topics Concern  . Not on file  Social History Narrative   Fun: Camping, Cruise    Denies abuse and feels safe at home.      Current Outpatient Medications:  .  atorvastatin (LIPITOR) 20 MG tablet, Take 1 tablet (20 mg total) by mouth daily., Disp: 90 tablet, Rfl: 0 .  Biotin 1 MG CAPS, Take by mouth., Disp: , Rfl:  .  Dulaglutide (TRULICITY) 1.5 WU/9.8JX SOPN, Inject 1.5 milligram  subcutaneously every week., Disp: 12 pen, Rfl: 1 .  glucose blood test strip, Use as instructed, Disp: 100 each, Rfl: 12 .  ibuprofen (ADVIL,MOTRIN) 200 MG tablet, Take 400 mg by mouth every 6 (six) hours as needed for mild pain or moderate pain. Reported on 07/12/2015, Disp: , Rfl:  .  Lancets (ONETOUCH ULTRASOFT) lancets, Use as instructed, Disp: 100 each, Rfl: 12 .  metFORMIN (GLUCOPHAGE-XR) 500 MG 24 hr tablet, TAKE 2 TABLETS BY MOUTH ONCE DAILY WITH BREAKFAST., Disp: 180 tablet, Rfl: 0 .  Multiple Vitamin (MULTIVITAMIN WITH MINERALS) TABS tablet, Take 1 tablet by mouth daily., Disp: , Rfl:   Allergies  Allergen Reactions  . Epinephrine Anaphylaxis  . Lidocaine Anaphylaxis     ROS See HPI  Objective  Vitals:   10/10/17 1554  BP: 126/82  Pulse: 88  SpO2: 98%  Weight: 187 lb (84.8 kg)  Height: 5' (1.524 m)    Body mass index is 36.52 kg/m.  Physical Exam Vital signs reviewed. Constitutional: Patient appears well-developed and well-nourished. No distress.  HENT: Head: Normocephalic and atraumatic.  Nose: Nose normal. Mouth/Throat: Oropharynx is clear and moist. No oropharyngeal exudate.  Eyes: Conjunctivae and EOM are normal. Pupils are equal, round, and reactive to light. No scleral icterus.  Neck: Normal range of motion. Neck supple. Cardiovascular: Normal rate, regular rhythm and normal heart sounds. No BLE edema. Distal pulses intact. Pulmonary/Chest: Effort normal and breath sounds normal. No respiratory distress. Neurological: She is alert and oriented to person, place, and time. No cranial nerve deficit. Coordination, balance, strength, speech and gait are normal.  Skin: Skin is warm and dry. No rash noted. No erythema.  Psychiatric: Patient has a normal mood and affect. behavior is normal. Judgment and thought content normal.  Diabetic Foot Exam: Diabetic Foot Exam - Simple   Simple Foot Form Diabetic Foot exam was performed with the following findings:  Yes    Visual Inspection No deformities, no ulcerations, no other skin breakdown bilaterally:  Yes Sensation Testing Intact to touch and monofilament testing bilaterally:  Yes Pulse Check Posterior Tibialis and Dorsalis pulse intact bilaterally:  Yes Comments     Assessment & Plan RTC in 6 months for routine follow up: DM- A1c, CPE?  -Reviewed Health Maintenance:  Declines HIV screening, pneumonia vaccination - Microalbumin / creatinine urine ratio; Future - Hemoglobin A1c; Future - MM DIGITAL SCREENING BILATERAL; Future-Screening for breast cancer - HM DIABETES FOOT EXAM; Future-We discussed routine foot inspection and foot care at home. - Tdap vaccine greater than or equal to 7yo IM-Need for Tdap  vaccination

## 2017-10-10 NOTE — Assessment & Plan Note (Signed)
Continue current medications Update A1c F/U with further recommendations pending lab results Discussed the role of healthy diet and exercise in the management of diabetes and printed additional information on AVS - Microalbumin / creatinine urine ratio; Future - Hemoglobin A1c; Future - HM DIABETES FOOT EXAM; Future

## 2017-10-10 NOTE — Patient Instructions (Addendum)
Please head downstairs for lab work. If any of your test results are critically abnormal, you will be contacted right away. Otherwise, I will contact you within a week about your test results and follow up recommendations.  I will plan to see you back in 6 months for routine follow up, or sooner if needed.   Diabetes Mellitus and Nutrition When you have diabetes (diabetes mellitus), it is very important to have healthy eating habits because your blood sugar (glucose) levels are greatly affected by what you eat and drink. Eating healthy foods in the appropriate amounts, at about the same times every day, can help you:  Control your blood glucose.  Lower your risk of heart disease.  Improve your blood pressure.  Reach or maintain a healthy weight.  Every person with diabetes is different, and each person has different needs for a meal plan. Your health care provider may recommend that you work with a diet and nutrition specialist (dietitian) to make a meal plan that is best for you. Your meal plan may vary depending on factors such as:  The calories you need.  The medicines you take.  Your weight.  Your blood glucose, blood pressure, and cholesterol levels.  Your activity level.  Other health conditions you have, such as heart or kidney disease.  How do carbohydrates affect me? Carbohydrates affect your blood glucose level more than any other type of food. Eating carbohydrates naturally increases the amount of glucose in your blood. Carbohydrate counting is a method for keeping track of how many carbohydrates you eat. Counting carbohydrates is important to keep your blood glucose at a healthy level, especially if you use insulin or take certain oral diabetes medicines. It is important to know how many carbohydrates you can safely have in each meal. This is different for every person. Your dietitian can help you calculate how many carbohydrates you should have at each meal and for  snack. Foods that contain carbohydrates include:  Bread, cereal, rice, pasta, and crackers.  Potatoes and corn.  Peas, beans, and lentils.  Milk and yogurt.  Fruit and juice.  Desserts, such as cakes, cookies, ice cream, and candy.  How does alcohol affect me? Alcohol can cause a sudden decrease in blood glucose (hypoglycemia), especially if you use insulin or take certain oral diabetes medicines. Hypoglycemia can be a life-threatening condition. Symptoms of hypoglycemia (sleepiness, dizziness, and confusion) are similar to symptoms of having too much alcohol. If your health care provider says that alcohol is safe for you, follow these guidelines:  Limit alcohol intake to no more than 1 drink per day for nonpregnant women and 2 drinks per day for men. One drink equals 12 oz of beer, 5 oz of wine, or 1 oz of hard liquor.  Do not drink on an empty stomach.  Keep yourself hydrated with water, diet soda, or unsweetened iced tea.  Keep in mind that regular soda, juice, and other mixers may contain a lot of sugar and must be counted as carbohydrates.  What are tips for following this plan? Reading food labels  Start by checking the serving size on the label. The amount of calories, carbohydrates, fats, and other nutrients listed on the label are based on one serving of the food. Many foods contain more than one serving per package.  Check the total grams (g) of carbohydrates in one serving. You can calculate the number of servings of carbohydrates in one serving by dividing the total carbohydrates by 15. For example,  if a food has 30 g of total carbohydrates, it would be equal to 2 servings of carbohydrates.  Check the number of grams (g) of saturated and trans fats in one serving. Choose foods that have low or no amount of these fats.  Check the number of milligrams (mg) of sodium in one serving. Most people should limit total sodium intake to less than 2,300 mg per day.  Always  check the nutrition information of foods labeled as "low-fat" or "nonfat". These foods may be higher in added sugar or refined carbohydrates and should be avoided.  Talk to your dietitian to identify your daily goals for nutrients listed on the label. Shopping  Avoid buying canned, premade, or processed foods. These foods tend to be high in fat, sodium, and added sugar.  Shop around the outside edge of the grocery store. This includes fresh fruits and vegetables, bulk grains, fresh meats, and fresh dairy. Cooking  Use low-heat cooking methods, such as baking, instead of high-heat cooking methods like deep frying.  Cook using healthy oils, such as olive, canola, or sunflower oil.  Avoid cooking with butter, cream, or high-fat meats. Meal planning  Eat meals and snacks regularly, preferably at the same times every day. Avoid going long periods of time without eating.  Eat foods high in fiber, such as fresh fruits, vegetables, beans, and whole grains. Talk to your dietitian about how many servings of carbohydrates you can eat at each meal.  Eat 4-6 ounces of lean protein each day, such as lean meat, chicken, fish, eggs, or tofu. 1 ounce is equal to 1 ounce of meat, chicken, or fish, 1 egg, or 1/4 cup of tofu.  Eat some foods each day that contain healthy fats, such as avocado, nuts, seeds, and fish. Lifestyle   Check your blood glucose regularly.  Exercise at least 30 minutes 5 or more days each week, or as told by your health care provider.  Take medicines as told by your health care provider.  Do not use any products that contain nicotine or tobacco, such as cigarettes and e-cigarettes. If you need help quitting, ask your health care provider.  Work with a Social worker or diabetes educator to identify strategies to manage stress and any emotional and social challenges. What are some questions to ask my health care provider?  Do I need to meet with a diabetes educator?  Do I need  to meet with a dietitian?  What number can I call if I have questions?  When are the best times to check my blood glucose? Where to find more information:  American Diabetes Association: diabetes.org/food-and-fitness/food  Academy of Nutrition and Dietetics: PokerClues.dk  [employer] of Diabetes and Digestive and Kidney Diseases (NIH): ContactWire.be Summary  A healthy meal plan will help you control your blood glucose and maintain a healthy lifestyle.  Working with a diet and nutrition specialist (dietitian) can help you make a meal plan that is best for you.  Keep in mind that carbohydrates and alcohol have immediate effects on your blood glucose levels. It is important to count carbohydrates and to use alcohol carefully. This information is not intended to replace advice given to you by your health care provider. Make sure you discuss any questions you have with your health care provider. Document Released: 11/16/2004 Document Revised: 03/26/2016 Document Reviewed: 03/26/2016 Elsevier Interactive Patient Education  [employer].

## 2017-10-17 ENCOUNTER — Other Ambulatory Visit: Payer: Self-pay | Admitting: Nurse Practitioner

## 2017-10-17 DIAGNOSIS — E118 Type 2 diabetes mellitus with unspecified complications: Secondary | ICD-10-CM

## 2017-10-17 DIAGNOSIS — E1121 Type 2 diabetes mellitus with diabetic nephropathy: Secondary | ICD-10-CM

## 2017-10-17 MED ORDER — LISINOPRIL 2.5 MG PO TABS: 2.5000 mg | ORAL_TABLET | Freq: Every day | ORAL | 1 refills | Status: DC

## 2017-10-17 NOTE — Progress Notes (Signed)
ORDERS

## 2017-11-05 ENCOUNTER — Ambulatory Visit
Admission: RE | Admit: 2017-11-05 | Discharge: 2017-11-05 | Disposition: A | Discharge status: Home / Self Care | Payer: BLUE CROSS/BLUE SHIELD | Source: Ambulatory Visit | Attending: Nurse Practitioner | Admitting: Nurse Practitioner

## 2017-11-05 DIAGNOSIS — Z1239 Encounter for other screening for malignant neoplasm of breast: Secondary | ICD-10-CM

## 2017-11-05 DIAGNOSIS — Z0000 Encounter for general adult medical examination without abnormal findings: Secondary | ICD-10-CM

## 2017-11-05 IMAGING — MG DIGITAL SCREENING BILATERAL MAMMOGRAM WITH TOMO AND CAD
8 series · 8 of 24 positions shown · non-contrast
Comparison: Previous exam(s).

CLINICAL DATA: Screening.

EXAM:
DIGITAL SCREENING BILATERAL MAMMOGRAM WITH TOMO AND CAD

[L CC synth-2D]
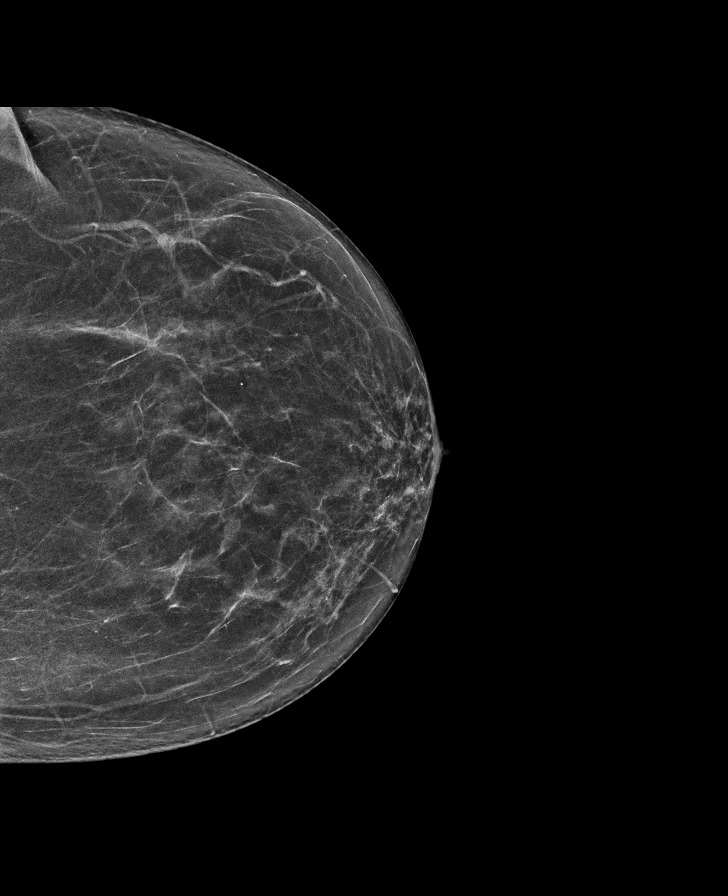

[L MLO synth-2D]
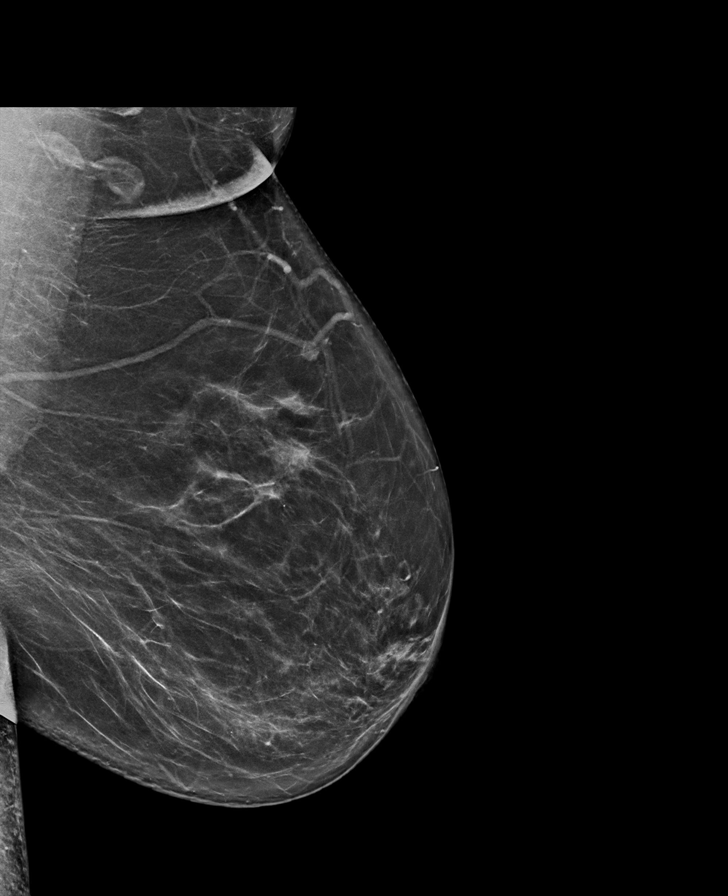

[R CC synth-2D]
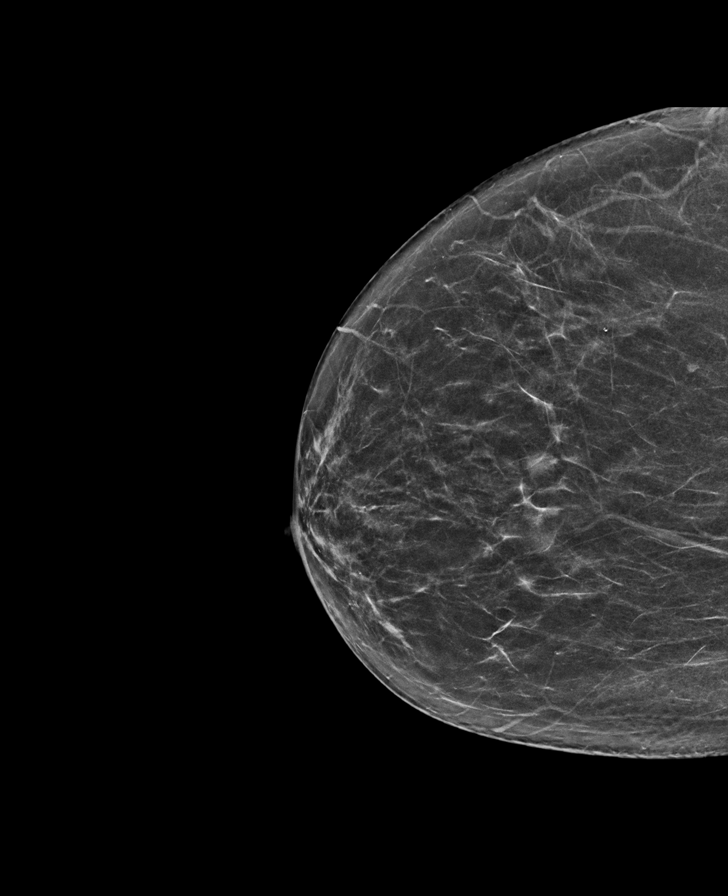

[R MLO synth-2D]
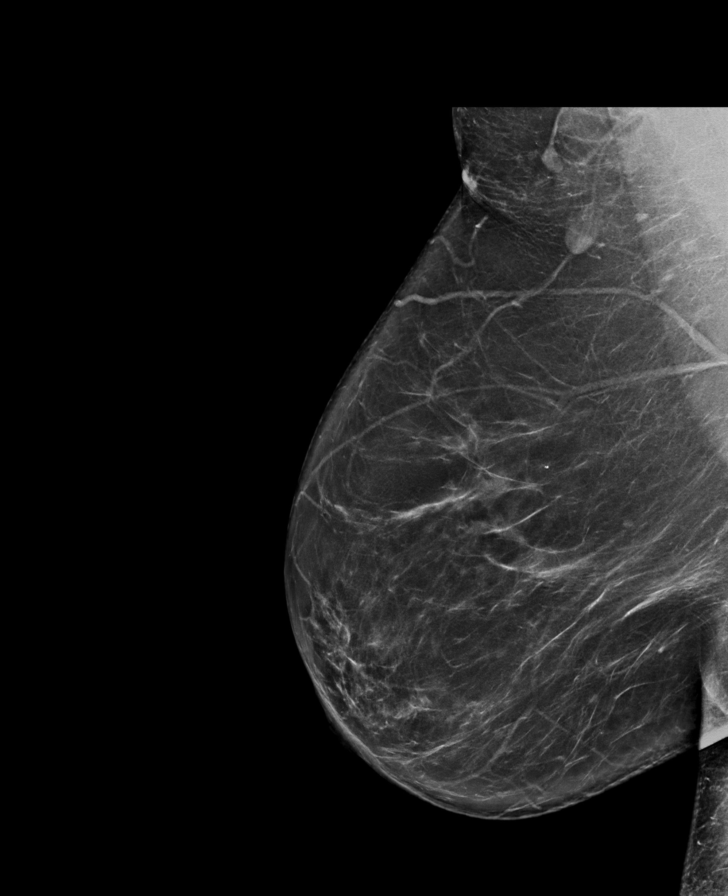

[R MLO tomo · tomo slice 43/85.0]
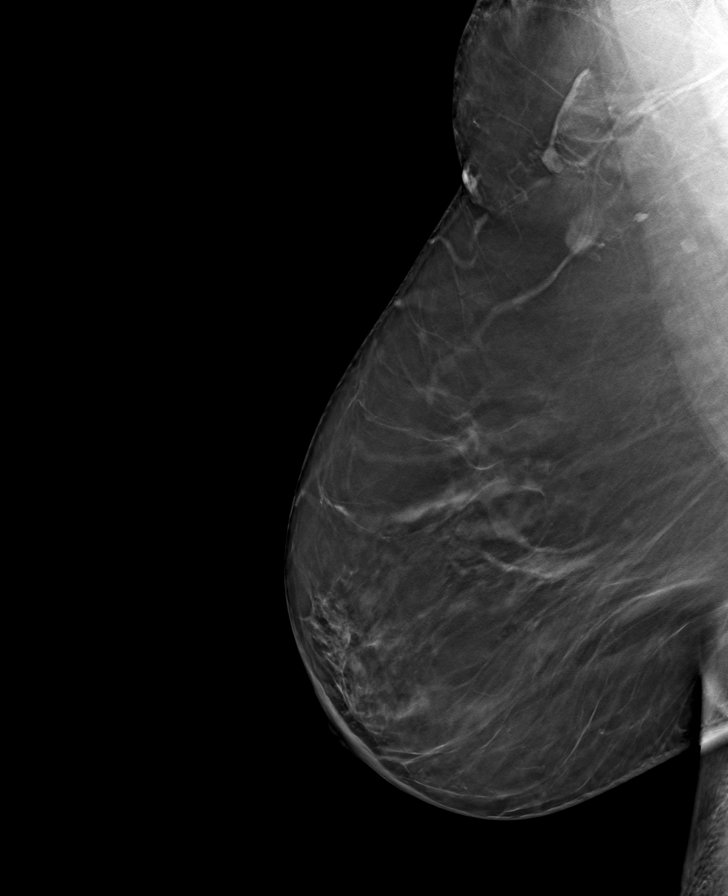

[L MLO tomo · tomo slice 40/79.0]
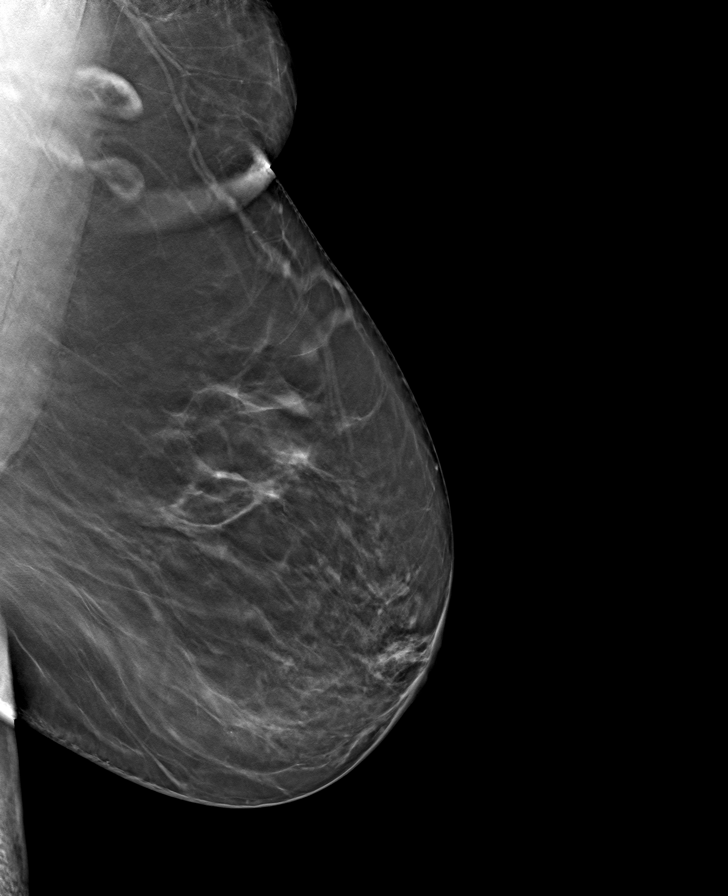

[R CC tomo · tomo slice 33/65.0]
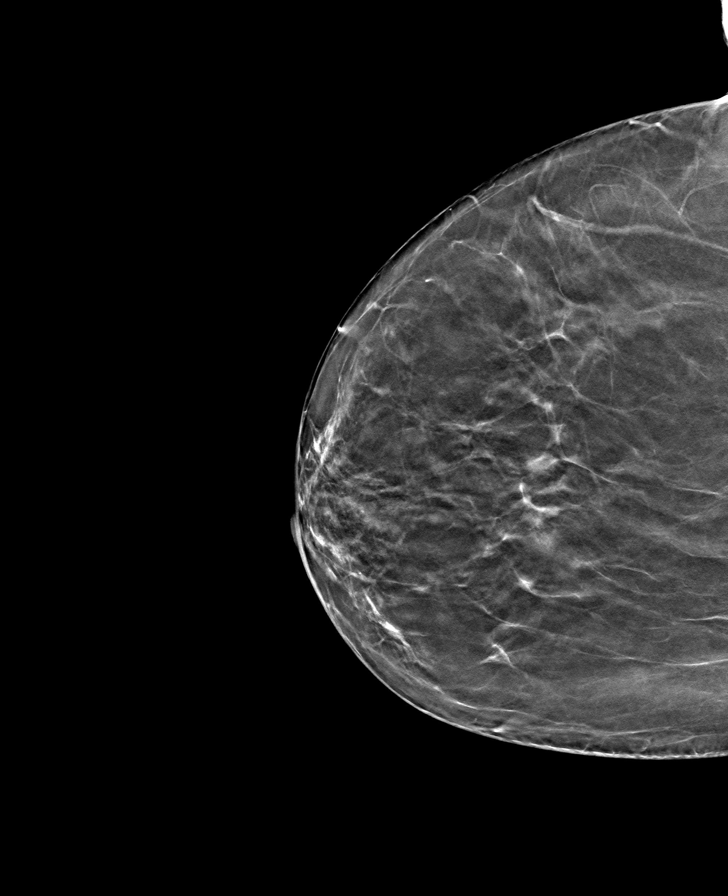

[L CC tomo · tomo slice 34/67.0]
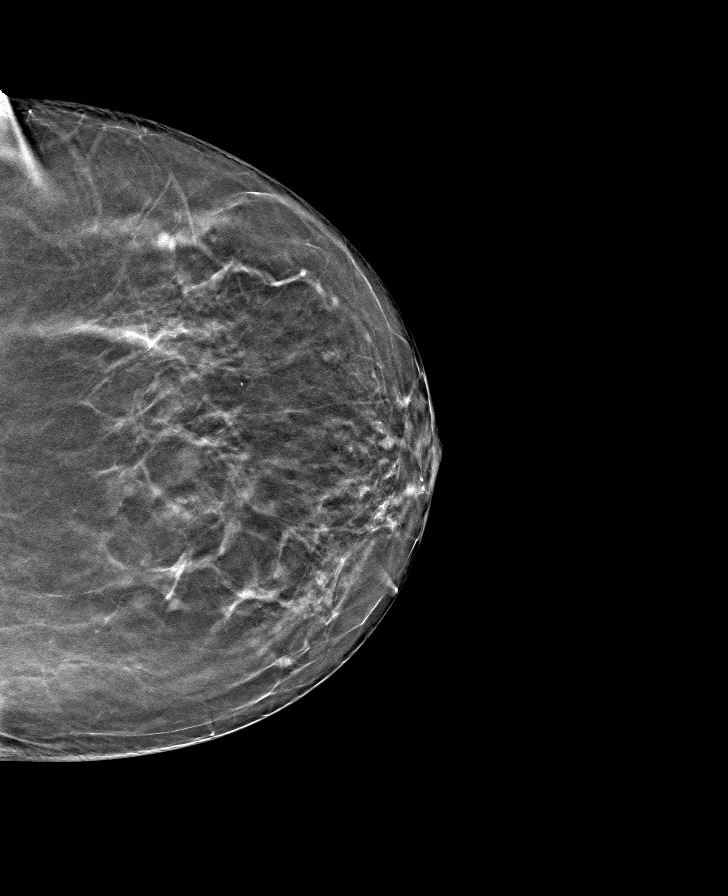

[8 of 24 positions shown; findings below may reference images not displayed]

ACR Breast Density Category b: There are scattered areas of
fibroglandular density.
FINDINGS: There are no findings suspicious for malignancy. Images were
processed with CAD.
IMPRESSION: No mammographic evidence of malignancy. A result letter of this
screening mammogram will be mailed directly to the patient.

RECOMMENDATION:
Screening mammogram in one year. (Code:[TQ])

BI-RADS CATEGORY  1: Negative.

## 2017-11-17 ENCOUNTER — Other Ambulatory Visit: Payer: Self-pay | Admitting: Nurse Practitioner

## 2017-11-17 DIAGNOSIS — E118 Type 2 diabetes mellitus with unspecified complications: Secondary | ICD-10-CM

## 2017-11-17 DIAGNOSIS — E1121 Type 2 diabetes mellitus with diabetic nephropathy: Secondary | ICD-10-CM

## 2017-11-24 ENCOUNTER — Other Ambulatory Visit: Payer: Self-pay | Admitting: Nurse Practitioner

## 2017-11-27 ENCOUNTER — Other Ambulatory Visit: Payer: Self-pay | Admitting: Nurse Practitioner

## 2017-11-27 DIAGNOSIS — E1121 Type 2 diabetes mellitus with diabetic nephropathy: Secondary | ICD-10-CM

## 2017-11-27 DIAGNOSIS — E118 Type 2 diabetes mellitus with unspecified complications: Secondary | ICD-10-CM

## 2017-12-02 ENCOUNTER — Other Ambulatory Visit: Payer: Self-pay | Admitting: Nurse Practitioner

## 2017-12-02 MED ORDER — DULAGLUTIDE 1.5 MG/0.5ML ~~LOC~~ SOAJ: SUBCUTANEOUS | 1 refills | Status: DC

## 2017-12-02 NOTE — Telephone Encounter (Signed)
Resent to pof../lmb 

## 2017-12-02 NOTE — Addendum Note (Signed)
Addended by: Earnstine Regal on: 12/02/2017 11:23 AM   Modules accepted: Orders

## 2018-04-18 ENCOUNTER — Other Ambulatory Visit: Payer: Self-pay | Admitting: *Deleted

## 2018-04-18 MED ORDER — METFORMIN HCL ER 500 MG PO TB24: 1000.0000 mg | ORAL_TABLET | Freq: Every day | ORAL | 0 refills | Status: DC

## 2018-04-24 ENCOUNTER — Other Ambulatory Visit: Payer: Self-pay | Admitting: *Deleted

## 2018-04-24 MED ORDER — ATORVASTATIN CALCIUM 20 MG PO TABS: 20.0000 mg | ORAL_TABLET | Freq: Every day | ORAL | 0 refills | Status: DC

## 2018-06-30 ENCOUNTER — Telehealth: Payer: Self-pay | Admitting: Nurse Practitioner

## 2018-06-30 NOTE — Telephone Encounter (Signed)
Patient has dropped off DMV form.  Once signed patient is requesting form to be mailed back to her.  Placing in [facility]'s box.  [place] pt.

## 2018-07-01 ENCOUNTER — Other Ambulatory Visit: Payer: Self-pay | Admitting: Family

## 2018-07-01 DIAGNOSIS — E785 Hyperlipidemia, unspecified: Secondary | ICD-10-CM

## 2018-07-01 DIAGNOSIS — E118 Type 2 diabetes mellitus with unspecified complications: Secondary | ICD-10-CM

## 2018-07-01 DIAGNOSIS — E1169 Type 2 diabetes mellitus with other specified complication: Secondary | ICD-10-CM

## 2018-07-01 NOTE — Telephone Encounter (Signed)
She is overdue for diabetes follow-up; was due in 04/2018; she can come get her labs done and would recommend VV in the next week or so.  Need clarification for why she needs a handicapped sticker. I will put on your desk and ask her the reasons so we can mark appropriately.

## 2018-07-03 ENCOUNTER — Encounter: Payer: Self-pay | Admitting: Internal Medicine

## 2018-07-03 ENCOUNTER — Ambulatory Visit: Payer: Self-pay | Admitting: *Deleted

## 2018-07-03 ENCOUNTER — Ambulatory Visit (INDEPENDENT_AMBULATORY_CARE_PROVIDER_SITE_OTHER): Payer: BLUE CROSS/BLUE SHIELD | Admitting: Internal Medicine

## 2018-07-03 DIAGNOSIS — R1030 Lower abdominal pain, unspecified: Secondary | ICD-10-CM

## 2018-07-03 DIAGNOSIS — R112 Nausea with vomiting, unspecified: Secondary | ICD-10-CM

## 2018-07-03 DIAGNOSIS — R197 Diarrhea, unspecified: Secondary | ICD-10-CM | POA: Diagnosis not present

## 2018-07-03 DIAGNOSIS — R109 Unspecified abdominal pain: Secondary | ICD-10-CM | POA: Insufficient documentation

## 2018-07-03 MED ORDER — ONDANSETRON HCL 4 MG PO TABS: 4.0000 mg | ORAL_TABLET | Freq: Three times a day (TID) | ORAL | 0 refills | Status: DC | PRN

## 2018-07-03 NOTE — Assessment & Plan Note (Signed)
Sounds to be related to the diarrhea. Potentially some scar tissue which causes intermittent obstruction possibly. Checking CBC, CMP, lipase at this time and rx for zofran. Depending on response of symptoms may need alternate treatment or imaging.

## 2018-07-03 NOTE — Telephone Encounter (Signed)
Patient already seen by virtual appointment

## 2018-07-03 NOTE — Assessment & Plan Note (Signed)
Rx for zofran

## 2018-07-03 NOTE — Assessment & Plan Note (Signed)
Sounds to be an acute on chronic issue. She is asked to skip metformin for 2-3 days until symptoms are resolved. She has had colon cancer surgery which appears to have caused her to have intermittent episodes of diarrhea. She needs labs to rule out infection or pancreatitis. She does have concurrent diabetes but states sugars are fine this morning. Talked to her about fluids to replace the fluid lost in diarrhea. BRAT diet. Does not sound typical for diverticulitis and checking CBC, CMP, lipase.

## 2018-07-03 NOTE — Progress Notes (Signed)
Virtual Visit via Video Note  I connected with [name] on 07/03/18 at  9:20 AM EDT by a video enabled telemedicine application and verified that I am speaking with the correct person using two identifiers.   I discussed the limitations of evaluation and management by telemedicine and the availability of in person appointments. The patient expressed understanding and agreed to proceed.  History of Present Illness: The patient is a 58 y.o. female with visit for abdominal pain and diarrhea and vomiting. Started 3 am this morning. She has had colon cancer and resection in the past and usually once per month she gets episodes of diarrhea like this due to backup. She has talked to several people about this over the years and has not gotten any kind of explanation. She normally has BM 4-5 times per day. She has intermittent dark stools and usually when she has her episodes they are dark. She denies taking iron pills. She normally does not get vomiting with this. She was throwing up this morning several times. Has stopped currently but still feeling slightly nauseous. She is due for one of her episodes as they are normally once per month and she had not had one this month. Pain is midline lower stomach and is moderate. 6/10 at worst. She is feeling a little better now and has had diarrhea most of the morning. She did see a spot of blood in the stool the most recent time. Denies that they were black but states that they were dark. Denies lightheadedness. Has not tried anything for this. Denies fevers or chills or cough or SOB or coronavirus symptoms. She states she thinks she had coronavirus in February. Overall it is stable to mild improvement. Has tried nothing.  Observations/Objective: Appearance: lying in bed, tired, breathing appears normal, casual grooming, abdomen does not appear distended, throat normal, mental status is A and O times 3  Assessment and Plan: See problem oriented charting  Follow  Up Instructions: checking CBC, CMP, lipase (cbc and cmp orders already in) and rx for zofran for nausea  I discussed the assessment and treatment plan with the patient. The patient was provided an opportunity to ask questions and all were answered. The patient agreed with the plan and demonstrated an understanding of the instructions.   The patient was advised to call back or seek an in-person evaluation if the symptoms worsen or if the condition fails to improve as anticipated.  Hoyt Koch, MD

## 2018-07-03 NOTE — Telephone Encounter (Signed)
Mid abdominal pain at belly button since coming on sudden at 3am this morning. Sharp intermittent pains that last about 2 minutes. Followed by vomiting and diarrhea, several episodes of each. Unable to keep floods down. Reports chills but skin does not feel warm. She is diabetic, blood sugar normal this morning she stated. Reported she had corona sxs in February. No travels no known exposures. Reviewed sxs needing immediate evaluation. Stated she understood. NPO until virtual appointment unless told otherwise by office. Warm transferred for virtual appointment.  Reason for Disposition . [1] MILD-MODERATE pain AND [2] constant AND [3] present > 2 hours  Answer Assessment - Initial Assessment Questions 1. LOCATION: "Where does it hurt?"     around belly button 2. RADIATION: "Does the pain shoot anywhere else?" (e.g., chest, back)     no 3. ONSET: "When did the pain begin?" (e.g., minutes, hours or days ago)      3am this morning 4. SUDDEN: "Gradual or sudden onset?"    sudden 5. PATTERN "Does the pain come and go, or is it constant?"    - If constant: "Is it getting better, staying the same, or worsening?"      (Note: Constant means the pain never goes away completely; most serious pain is constant and it progresses)     - If intermittent: "How long does it last?" "Do you have pain now?"     (Note: Intermittent means the pain goes away completely between bouts)     intermittent 6. SEVERITY: "How bad is the pain?"  (e.g., Scale 1-10; mild, moderate, or severe)   - MILD (1-3): doesn't interfere with normal activities, abdomen soft and not tender to touch    - MODERATE (4-7): interferes with normal activities or awakens from sleep, tender to touch    - SEVERE (8-10): excruciating pain, doubled over, unable to do any normal activities      10 when is occurs.  7. RECURRENT SYMPTOM: "Have you ever had this type of abdominal pain before?" If so, ask: "When was the last time?" and "What happened that  time?"      no 8. CAUSE: "What do you think is causing the abdominal pain?"     unsure 9. RELIEVING/AGGRAVATING FACTORS: "What makes it better or worse?" (e.g., movement, antacids, bowel movement)     nothing 10. OTHER SYMPTOMS: "Has there been any vomiting, diarrhea, constipation, or urine problems?"       Vomiting, diarrhea 11. PREGNANCY: "Is there any chance you are pregnant?" "When was your last menstrual period?"       no  Protocols used: ABDOMINAL PAIN - [facility]

## 2018-07-04 ENCOUNTER — Other Ambulatory Visit (INDEPENDENT_AMBULATORY_CARE_PROVIDER_SITE_OTHER): Payer: BLUE CROSS/BLUE SHIELD

## 2018-07-04 DIAGNOSIS — R197 Diarrhea, unspecified: Secondary | ICD-10-CM | POA: Diagnosis not present

## 2018-07-04 DIAGNOSIS — E785 Hyperlipidemia, unspecified: Secondary | ICD-10-CM | POA: Diagnosis not present

## 2018-07-04 DIAGNOSIS — E118 Type 2 diabetes mellitus with unspecified complications: Secondary | ICD-10-CM

## 2018-07-04 DIAGNOSIS — E1169 Type 2 diabetes mellitus with other specified complication: Secondary | ICD-10-CM | POA: Diagnosis not present

## 2018-07-04 LAB — COMPREHENSIVE METABOLIC PANEL
ALT: 17 U/L (ref 0–35)
AST: 17 U/L (ref 0–37)
Albumin: 4.5 g/dL (ref 3.5–5.2)
Alkaline Phosphatase: 69 U/L (ref 39–117)
BUN: 9 mg/dL (ref 6–23)
CO2: 27 mEq/L (ref 19–32)
Calcium: 9.2 mg/dL (ref 8.4–10.5)
Chloride: 104 mEq/L (ref 96–112)
Creatinine, Ser: 0.84 mg/dL (ref 0.40–1.20)
GFR: 83.88 mL/min (ref >60.00)
Glucose, Bld: 96 mg/dL (ref 70–99)
Potassium: 3.7 mEq/L (ref 3.5–5.1)
Sodium: 141 mEq/L (ref 135–145)
Total Bilirubin: 0.5 mg/dL (ref 0.2–1.2)
Total Protein: 7.3 g/dL (ref 6.0–8.3)

## 2018-07-04 LAB — CBC WITH DIFFERENTIAL/PLATELET
Basophils Absolute: 0 10*3/uL (ref 0.0–0.1)
Basophils Relative: 0.6 % (ref 0.0–3.0)
Eosinophils Absolute: 0.1 10*3/uL (ref 0.0–0.7)
Eosinophils Relative: 2.9 % (ref 0.0–5.0)
HCT: 38.8 % (ref 36.0–46.0)
Hemoglobin: 12.4 g/dL (ref 12.0–15.0)
Lymphocytes Relative: 52.1 % — ABNORMAL HIGH (ref 12.0–46.0)
Lymphs Abs: 2.1 10*3/uL (ref 0.7–4.0)
MCHC: 31.9 g/dL (ref 30.0–36.0)
MCV: 69.5 fl — ABNORMAL LOW (ref 78.0–100.0)
Monocytes Absolute: 0.4 10*3/uL (ref 0.1–1.0)
Monocytes Relative: 9 % (ref 3.0–12.0)
Neutro Abs: 1.4 10*3/uL (ref 1.4–7.7)
Neutrophils Relative %: 35.4 % — ABNORMAL LOW (ref 43.0–77.0)
Platelets: 267 10*3/uL (ref 150.0–400.0)
RBC: 5.58 Mil/uL — ABNORMAL HIGH (ref 3.87–5.11)
RDW: 15.4 % (ref 11.5–15.5)
WBC: 4 10*3/uL (ref 4.0–10.5)

## 2018-07-04 LAB — LIPID PANEL
Cholesterol: 140 mg/dL (ref 0–200)
HDL: 39.9 mg/dL (ref >39.00)
LDL Cholesterol: 72 mg/dL (ref 0–99)
NonHDL: 99.89
Total CHOL/HDL Ratio: 4
Triglycerides: 141 mg/dL (ref 0.0–149.0)
VLDL: 28.2 mg/dL (ref 0.0–40.0)

## 2018-07-04 LAB — LIPASE: Lipase: 19 U/L (ref 11.0–59.0)

## 2018-07-04 LAB — HEMOGLOBIN A1C: Hgb A1c MFr Bld: 7 % — ABNORMAL HIGH (ref 4.6–6.5)

## 2018-07-09 ENCOUNTER — Ambulatory Visit: Payer: Self-pay | Admitting: *Deleted

## 2018-07-09 ENCOUNTER — Telehealth: Payer: Self-pay | Admitting: Nurse Practitioner

## 2018-07-09 NOTE — Telephone Encounter (Signed)
Left upper chest discomfort last night lasting several hours and this morning to where she could not raise her arm.Did not take any medication pain resolved on its own. Is in the area where a port-a-cath had been placed 10 years ago. Pain has eased off and she is able to lift and use the arm now. Pain does not radiate.  No palpitations no sweating/SOB/dizziness/N/V/fever. No travels no known exposures. Thinks she had covid-19 in February. warm transferred for appointment for tomorrow. Reviewed urgent symptoms she should seek immediate evaluation for. Stated she understood. She may be reached by cell.  Reason for Disposition . [1] Chest pain lasts > 5 minutes AND [2] occurred > 3 days ago (72 hours) AND [3] NO chest pain or cardiac symptoms now  Answer Assessment - Initial Assessment Questions 1. LOCATION: "Where does it hurt?"       Left sided upper chest pain. 2. RADIATION: "Does the pain go anywhere else?" (e.g., into neck, jaw, arms, back)     Does not radiate 3. ONSET: "When did the chest pain begin?" (Minutes, hours or days)      Started yesterday and lasted entire day.  4. PATTERN "Does the pain come and go, or has it been constant since it started?"  "Does it get worse with exertion?"      Like a punch in the chest but it does ease some.  5. DURATION: "How long does it last" (e.g., seconds, minutes, hours)    All day yesterday.  6. SEVERITY: "How bad is the pain?"  (e.g., Scale 1-10; mild, moderate, or severe)    - MILD (1-3): doesn't interfere with normal activities     - MODERATE (4-7): interferes with normal activities or awakens from sleep    - SEVERE (8-10): excruciating pain, unable to do any normal activities       moderate 7. CARDIAC RISK FACTORS: "Do you have any history of heart problems or risk factors for heart disease?" (e.g., prior heart attack, angina; high blood pressure, diabetes, being overweight, high cholesterol, smoking, or strong family history of heart disease)  diabetes 8. PULMONARY RISK FACTORS: "Do you have any history of lung disease?"  (e.g., blood clots in lung, asthma, emphysema, birth control pills)     no 9. CAUSE: "What do you think is causing the chest pain?"     none 10. OTHER SYMPTOMS: "Do you have any other symptoms?" (e.g., dizziness, nausea, vomiting, sweating, fever, difficulty breathing, cough)     Coughed maybe 3 times yesterday after being outside. 11. PREGNANCY: "Is there any chance you are pregnant?" "When was your last menstrual period?"       no  Protocols used: CHEST PAIN-A-[facility]

## 2018-07-09 NOTE — Telephone Encounter (Signed)
Info given to patient

## 2018-07-09 NOTE — Telephone Encounter (Signed)
Copied from [employer] [id number]. Topic: Quick Communication - See Telephone Encounter >> Jul 09, 2018  2:45 PM Rutherford Nail, [place] wrote: CRM for notification. See Telephone encounter for: 07/09/18. Patient calling and states that she was advised on 07/03/2018 to stop taking her Metformin due to it causing diarrhea. States that she stopped taking it up until yesterday. States that since she had taken it yesterday, she has been having the diarrhea again. Would like to know if she needs to stop taking the medication and be put on something else? Please advise.

## 2018-07-09 NOTE — Telephone Encounter (Signed)
I would stop taking metformin  TJ

## 2018-07-10 ENCOUNTER — Telehealth: Payer: Self-pay

## 2018-07-10 ENCOUNTER — Encounter: Payer: Self-pay | Admitting: Internal Medicine

## 2018-07-10 ENCOUNTER — Ambulatory Visit (INDEPENDENT_AMBULATORY_CARE_PROVIDER_SITE_OTHER): Payer: BLUE CROSS/BLUE SHIELD | Admitting: Internal Medicine

## 2018-07-10 DIAGNOSIS — M25512 Pain in left shoulder: Secondary | ICD-10-CM

## 2018-07-10 NOTE — Assessment & Plan Note (Signed)
Unclear etiology and does not sound typical for anginal pain. She will monitor for recurrence and call back for recurrence. Last EKG reviewed and not abnormal and within 1 year. She does have risk factors for CAD. She can take tylenol for the pain.

## 2018-07-10 NOTE — Telephone Encounter (Signed)
Patient doing virtual visit today with Dr. Sharlet Salina.

## 2018-07-10 NOTE — Progress Notes (Signed)
Virtual Visit via Video Note  I connected with [name] [name] on 07/10/18 at  3:20 PM EDT by a video enabled telemedicine application and verified that I am speaking with the correct person using two identifiers.  The patient and the provider were at separate locations throughout the entire encounter.   I discussed the limitations of evaluation and management by telemedicine and the availability of in person appointments. The patient expressed understanding and agreed to proceed.  History of Present Illness: The patient is a 58 y.o. female with visit for left shoulder pain. Started in the middle of the night last night and awoke her. She denies weakness or numbness from it. Denies pain currently. Did pass within a few hours. She denies chest pain with activity today. She denies pain like this in the past. She did not take anything for it. Denies injury or overuse recently. Recent GI episode with diarrhea about 1 week ago which passed. She did have another episode of diarrhea problems in the last several days. Has no fevers or chills or cough or SOB. Denies headaches or chest pains. Overall it is gone now. Has tried nothing  Observations/Objective: Appearance: normal, breathing appears normal, casual grooming, abdomen does not appear distended, throat normal, no pain to self palpation on the left chest wall or shoulder, full ROM active left shoulder, mental status is A and O times 3  Assessment and Plan: See problem oriented charting  Follow Up Instructions: monitor for recurrence and in person visit with EKG when available, call back recurrence  I discussed the assessment and treatment plan with the patient. The patient was provided an opportunity to ask questions and all were answered. The patient agreed with the plan and demonstrated an understanding of the instructions.   The patient was advised to call back or seek an in-person evaluation if the symptoms worsen or if the condition fails to  improve as anticipated.  Hoyt Koch, MD

## 2018-07-10 NOTE — Telephone Encounter (Signed)
Completed.

## 2018-07-14 ENCOUNTER — Ambulatory Visit (INDEPENDENT_AMBULATORY_CARE_PROVIDER_SITE_OTHER): Payer: BLUE CROSS/BLUE SHIELD | Admitting: Family

## 2018-07-14 ENCOUNTER — Encounter: Payer: Self-pay | Admitting: Family

## 2018-07-14 VITALS — BP 122/78 | HR 91 | Temp 98.3°F | Ht 60.0 in | Wt 188.1 lb

## 2018-07-14 DIAGNOSIS — E1169 Type 2 diabetes mellitus with other specified complication: Secondary | ICD-10-CM

## 2018-07-14 DIAGNOSIS — E118 Type 2 diabetes mellitus with unspecified complications: Secondary | ICD-10-CM | POA: Diagnosis not present

## 2018-07-14 DIAGNOSIS — E785 Hyperlipidemia, unspecified: Secondary | ICD-10-CM

## 2018-07-14 DIAGNOSIS — R112 Nausea with vomiting, unspecified: Secondary | ICD-10-CM | POA: Diagnosis not present

## 2018-07-14 MED ORDER — DULAGLUTIDE 1.5 MG/0.5ML ~~LOC~~ SOAJ: SUBCUTANEOUS | 1 refills | Status: DC

## 2018-07-14 NOTE — [place] Notes (Signed)
[name] [name] is a 58 y.o. female with the following history as recorded in EpicCare:  Patient Active Problem List   Diagnosis Date Noted  . Left shoulder pain 07/10/2018  . Abdominal pain 07/03/2018  . Diarrhea 07/03/2018  . Nausea with vomiting 07/03/2018  . Urinary frequency 06/15/2016  . Type 2 diabetes with complication ([place]) 75/12/2583  . Hyperlipidemia associated with type 2 diabetes mellitus ([place]) 12/30/2012  . Obesity (BMI 30-39.9) 12/23/2012  . Colon cancer ([place]) 10/31/2011    Current Outpatient Medications  Medication Sig Dispense Refill  . atorvastatin (LIPITOR) 20 MG tablet Take 1 tablet (20 mg total) by mouth daily. 90 tablet 0  . Biotin 1 MG CAPS Take by mouth.    . Dulaglutide (TRULICITY) 1.5 ID/7.8EU SOPN Inject 1.5 milligram subcutaneously every week. 12 pen 1  . glucose blood test strip Use as instructed 100 each 12  . ibuprofen (ADVIL,MOTRIN) 200 MG tablet Take 400 mg by mouth every 6 (six) hours as needed for mild pain or moderate pain. Reported on 07/12/2015    . Lancets (ONETOUCH ULTRASOFT) lancets Use as instructed 100 each 12  . Multiple Vitamin (MULTIVITAMIN WITH MINERALS) TABS tablet Take 1 tablet by mouth daily.    . ondansetron (ZOFRAN) 4 MG tablet Take 1 tablet (4 mg total) by mouth every 8 (eight) hours as needed for nausea or vomiting. 20 tablet 0   No current facility-administered medications for this visit.     Allergies: Epinephrine; Lidocaine; and Metformin and related  Past Medical History:  Diagnosis Date  . Abdominal hernia    4 hernias per pt  . Abdominal pain    mid abd pain radiating to right side  . Anemia   . Cancer ([place])    rectal ca  . Change in bowel movement   . Colon cancer ([place])   . Complication of anesthesia    cardiac arrest during nasal surgery  . Diabetes mellitus without complication ([place])   . Headache   . History of rectal bleeding     Past Surgical History:  Procedure Laterality Date  . CESAREAN SECTION    .  colon cancer    . COLON SURGERY    . COLONOSCOPY    . LAPAROSCOPIC LYSIS OF ADHESIONS  04/02/2014   Procedure: LAPAROSCOPIC LYSIS OF ADHESIONS;  Surgeon: Jackolyn Confer, MD;  Location: [facility];  Service: General;;  . NOSE SURGERY    . POLYPECTOMY    . VENTRAL HERNIA REPAIR N/A 04/02/2014   Procedure: LAPAROSCOPIC  LYSIS OF ADHESIONS AND REPAIR OF VENTRAL INCISIONAL HERNIAS WITH MESH;  Surgeon: Jackolyn Confer, MD;  Location: [facility];  Service: General;  Laterality: N/A;    Family History  Problem Relation Age of Onset  . Colon cancer Maternal Grandmother   . Colon cancer Maternal Aunt   . Uterine cancer Mother     Social History   Tobacco Use  . Smoking status: Former Smoker    Packs/day: 0.25    Years: 13.00    Pack years: 3.25    Types: Cigarettes    Last attempt to quit: 03/14/2003    Years since quitting: 15.3  . Smokeless tobacco: Never Used  Substance Use Topics  . Alcohol use: Yes    Alcohol/week: 0.0 standard drinks    Comment: occasional     Subjective:  6 month follow-up on Type 2 Diabetes; labs done prior to appointment- needs to review today; has opted to stop Metformin due to GI side effects and  is feeling much better; only taking Trulicity once per week- sugars averaging 109-129; Denies any chest pain, shortness of breath, blurred vision or headache; denies any concerns for low blood sugar;     Objective:  Vitals:   07/14/18 1550  BP: 122/78  Pulse: 91  Temp: 98.3 F (36.8 C)  TempSrc: Oral  SpO2: 99%  Weight: 188 lb 1.3 oz (85.3 kg)  Height: 5' (1.524 m)    General: Well developed, well nourished, in no acute distress  Skin : Warm and dry.  Head: Normocephalic and atraumatic  Lungs: Respirations unlabored; clear to auscultation bilaterally without wheeze, rales, rhonchi  CVS exam: normal rate and regular rhythm.  Neurologic: Alert and oriented; speech intact; face symmetrical; moves all extremities well; CNII-XII intact without focal deficit    Assessment:  1. Type 2 diabetes mellitus with complication, without long-term current use of insulin ([place])   2. Hyperlipidemia associated with type 2 diabetes mellitus ([place])   3. Nausea and vomiting, intractability of vomiting not specified, unspecified vomiting type     Plan:  1. Well controlled- Hgba1c at 7.0; continue with Trulicity weekly; follow-up in 3 months, sooner prn. 2. Stable- continue Atorvastatin as prescribed; 3. Symptoms have resolved since stopping Metformin; due for colonoscopy in 2022;   Return in about 3 months (around 10/14/2018) for pap smear/ type 2 diabetes.  No orders of the defined types were placed in this encounter.   Requested Prescriptions   Signed Prescriptions Disp Refills  . Dulaglutide (TRULICITY) 1.5 HX/5.0VW SOPN 12 pen 1    Sig: Inject 1.5 milligram subcutaneously every week.

## 2018-07-23 ENCOUNTER — Other Ambulatory Visit: Payer: Self-pay | Admitting: *Deleted

## 2018-07-23 MED ORDER — DULAGLUTIDE 1.5 MG/0.5ML ~~LOC~~ SOAJ: SUBCUTANEOUS | 0 refills | Status: DC

## 2018-07-23 MED ORDER — ATORVASTATIN CALCIUM 20 MG PO TABS: 20.0000 mg | ORAL_TABLET | Freq: Every day | ORAL | 0 refills | Status: DC

## 2018-10-06 ENCOUNTER — Other Ambulatory Visit (HOSPITAL_COMMUNITY)
Admission: RE | Admit: 2018-10-06 | Discharge: 2018-10-06 | Disposition: A | Discharge status: Home / Self Care | Payer: BC Managed Care – PPO | Source: Ambulatory Visit | Attending: Family | Admitting: Family

## 2018-10-06 ENCOUNTER — Ambulatory Visit (INDEPENDENT_AMBULATORY_CARE_PROVIDER_SITE_OTHER): Payer: BC Managed Care – PPO | Admitting: Family

## 2018-10-06 ENCOUNTER — Other Ambulatory Visit: Payer: Self-pay

## 2018-10-06 ENCOUNTER — Other Ambulatory Visit: Payer: BC Managed Care – PPO

## 2018-10-06 ENCOUNTER — Encounter: Payer: Self-pay | Admitting: Family

## 2018-10-06 VITALS — BP 118/78 | HR 80 | Temp 98.6°F | Ht 60.0 in | Wt 186.4 lb

## 2018-10-06 DIAGNOSIS — Z124 Encounter for screening for malignant neoplasm of cervix: Secondary | ICD-10-CM

## 2018-10-06 NOTE — [place] Notes (Signed)
[name] [name] is a 58 y.o. female with the following history as recorded in EpicCare:  Patient Active Problem List   Diagnosis Date Noted  . Left shoulder pain 07/10/2018  . Abdominal pain 07/03/2018  . Diarrhea 07/03/2018  . Nausea with vomiting 07/03/2018  . Urinary frequency 06/15/2016  . Type 2 diabetes with complication ([place]) 16/12/9602  . Hyperlipidemia associated with type 2 diabetes mellitus ([place]) 12/30/2012  . Obesity (BMI 30-39.9) 12/23/2012  . Colon cancer ([place]) 10/31/2011    Current Outpatient Medications  Medication Sig Dispense Refill  . atorvastatin (LIPITOR) 20 MG tablet Take 1 tablet (20 mg total) by mouth daily. 90 tablet 0  . Biotin 1 MG CAPS Take by mouth.    . Dulaglutide (TRULICITY) 1.5 VW/0.9WJ SOPN Inject 1.5 milligram subcutaneously every week. 12 pen 0  . glucose blood test strip Use as instructed 100 each 12  . ibuprofen (ADVIL,MOTRIN) 200 MG tablet Take 400 mg by mouth every 6 (six) hours as needed for mild pain or moderate pain. Reported on 07/12/2015    . Lancets (ONETOUCH ULTRASOFT) lancets Use as instructed 100 each 12  . Multiple Vitamin (MULTIVITAMIN WITH MINERALS) TABS tablet Take 1 tablet by mouth daily.    . ondansetron (ZOFRAN) 4 MG tablet Take 1 tablet (4 mg total) by mouth every 8 (eight) hours as needed for nausea or vomiting. 20 tablet 0   No current facility-administered medications for this visit.     Allergies: Epinephrine, Lidocaine, and Metformin and related  Past Medical History:  Diagnosis Date  . Abdominal hernia    4 hernias per pt  . Abdominal pain    mid abd pain radiating to right side  . Anemia   . Cancer ([place])    rectal ca  . Change in bowel movement   . Colon cancer ([place])   . Complication of anesthesia    cardiac arrest during nasal surgery  . Diabetes mellitus without complication ([place])   . Headache   . History of rectal bleeding     Past Surgical History:  Procedure Laterality Date  . CESAREAN SECTION    .  colon cancer    . COLON SURGERY    . COLONOSCOPY    . LAPAROSCOPIC LYSIS OF ADHESIONS  04/02/2014   Procedure: LAPAROSCOPIC LYSIS OF ADHESIONS;  Surgeon: Jackolyn Confer, MD;  Location: [facility];  Service: General;;  . NOSE SURGERY    . POLYPECTOMY    . VENTRAL HERNIA REPAIR N/A 04/02/2014   Procedure: LAPAROSCOPIC  LYSIS OF ADHESIONS AND REPAIR OF VENTRAL INCISIONAL HERNIAS WITH MESH;  Surgeon: Jackolyn Confer, MD;  Location: [facility];  Service: General;  Laterality: N/A;    Family History  Problem Relation Age of Onset  . Colon cancer Maternal Grandmother   . Colon cancer Maternal Aunt   . Uterine cancer Mother     Social History   Tobacco Use  . Smoking status: Former Smoker    Packs/day: 0.25    Years: 13.00    Pack years: 3.25    Types: Cigarettes    Quit date: 03/14/2003    Years since quitting: 15.5  . Smokeless tobacco: Never Used  Substance Use Topics  . 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  Head: Normocephalic and atraumatic  Lungs: Respirations unlabored;  Neurologic: Alert and oriented; speech intact; face symmetrical; moves all extremities well; CNII-XII intact without focal deficit  Pelvic exam: normal external genitalia, vulva, vagina, cervix, uterus and adnexa.  Assessment:  1. Cervical cancer screening     Plan:  Thin Prep pap completed; encouraged to get her mammogram and she  agrees; Follow-up in 3 months to update labs for diabetes.   Return in about 3 months (around 01/06/2019).  No orders of the defined types were placed in this encounter.   Requested Prescriptions    No prescriptions requested or ordered in this encounter

## 2018-10-09 LAB — CYTOLOGY - PAP
Diagnosis: NEGATIVE
HPV: NOT DETECTED

## 2018-10-16 ENCOUNTER — Other Ambulatory Visit: Payer: Self-pay | Admitting: Family

## 2018-10-16 DIAGNOSIS — Z1231 Encounter for screening mammogram for malignant neoplasm of breast: Secondary | ICD-10-CM

## 2018-12-02 ENCOUNTER — Ambulatory Visit
Admission: RE | Admit: 2018-12-02 | Discharge: 2018-12-02 | Disposition: A | Discharge status: Home / Self Care | Payer: BC Managed Care – PPO | Source: Ambulatory Visit | Attending: Family | Admitting: Family

## 2018-12-02 ENCOUNTER — Other Ambulatory Visit: Payer: Self-pay

## 2018-12-02 DIAGNOSIS — Z1231 Encounter for screening mammogram for malignant neoplasm of breast: Secondary | ICD-10-CM

## 2018-12-02 IMAGING — MG MM DIGITAL SCREENING BILAT W/ TOMO W/ CAD
8 series · 8 of 24 positions shown · non-contrast
Comparison: Previous exam(s).

CLINICAL DATA: Screening.

EXAM:
DIGITAL SCREENING BILATERAL MAMMOGRAM WITH TOMO AND CAD

[R CC synth-2D]
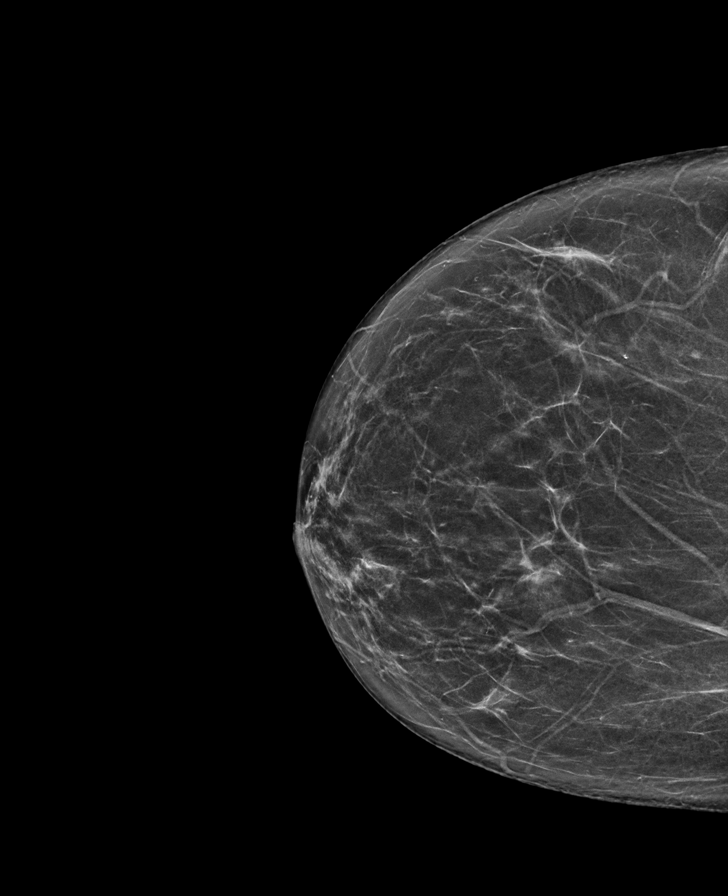

[R MLO synth-2D]
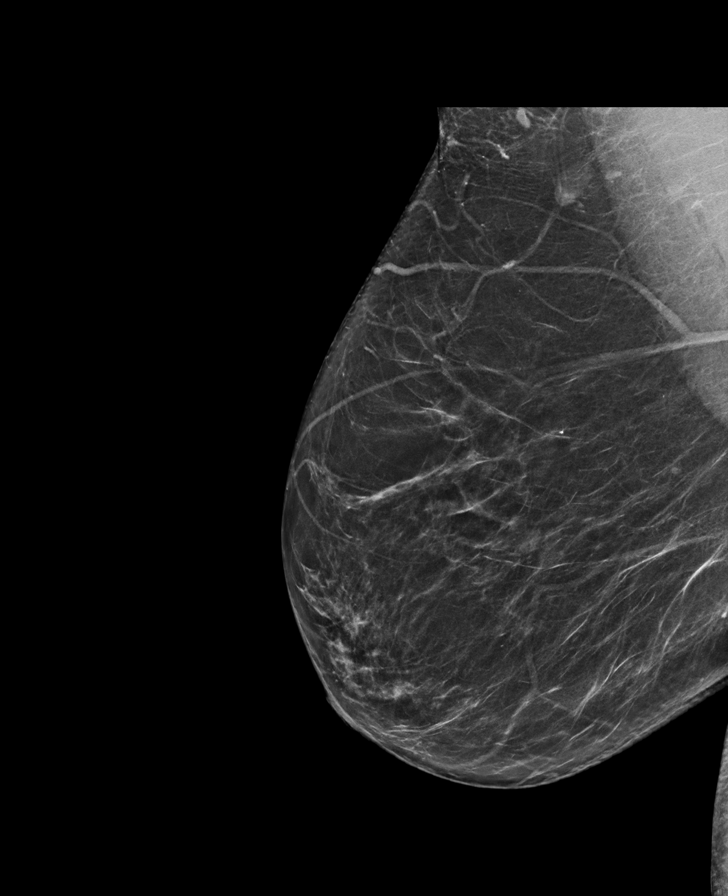

[L CC synth-2D]
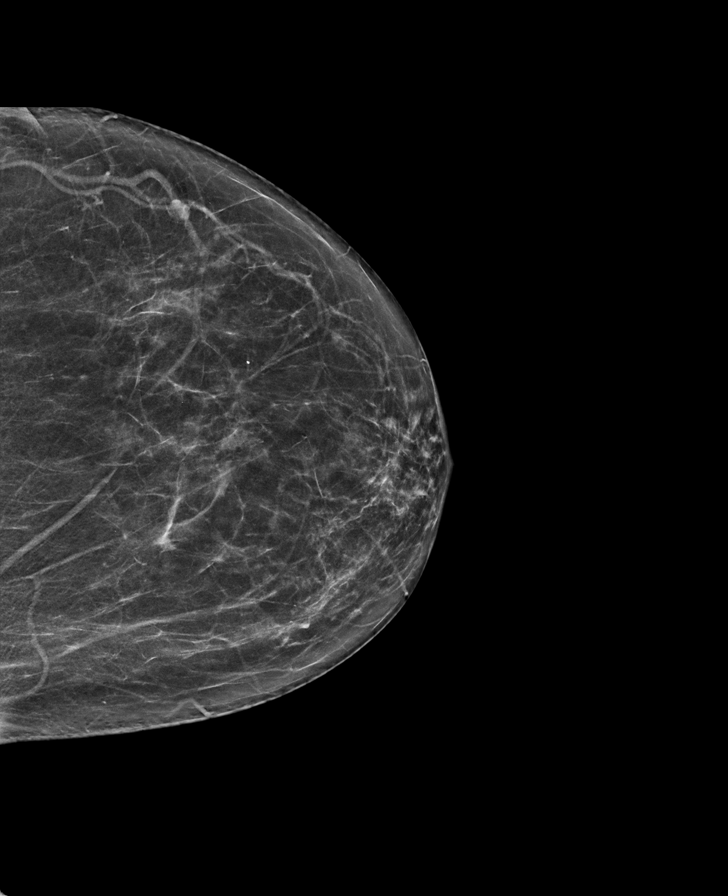

[L MLO synth-2D]
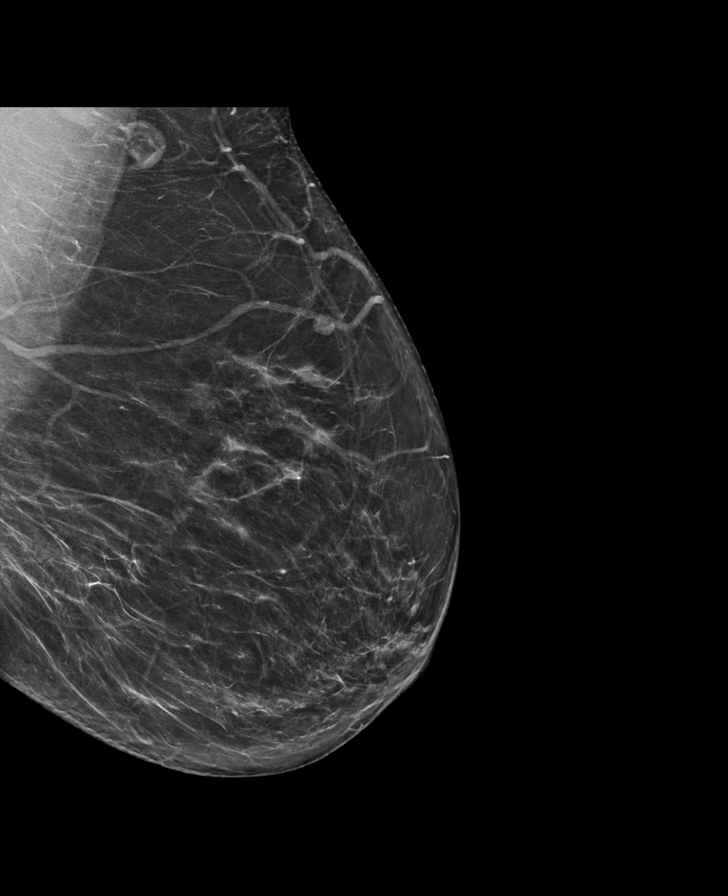

[R CC tomo · tomo slice 33/66.0]
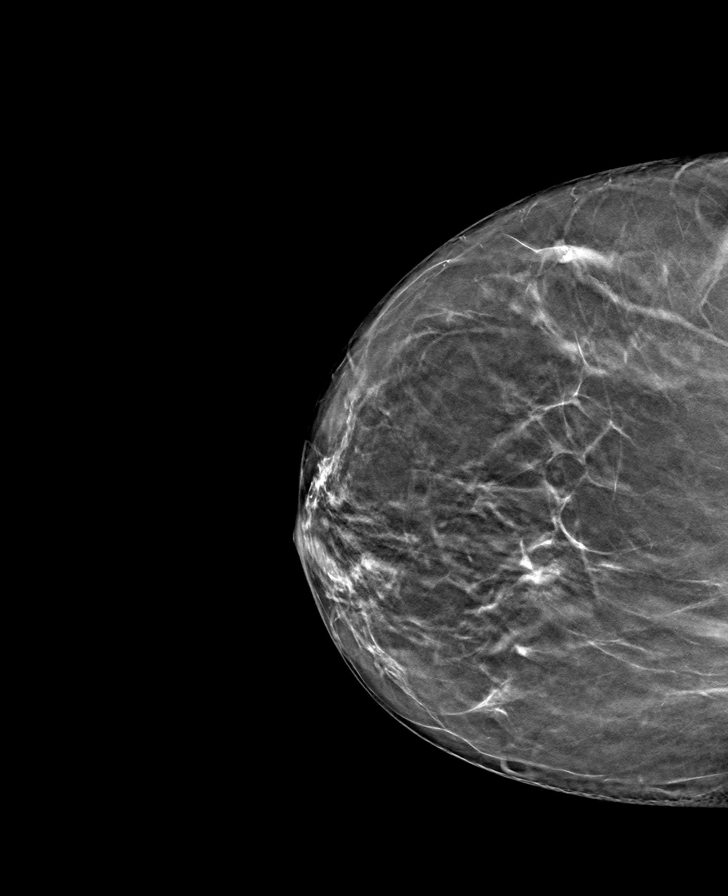

[R MLO tomo · tomo slice 39/77.0]
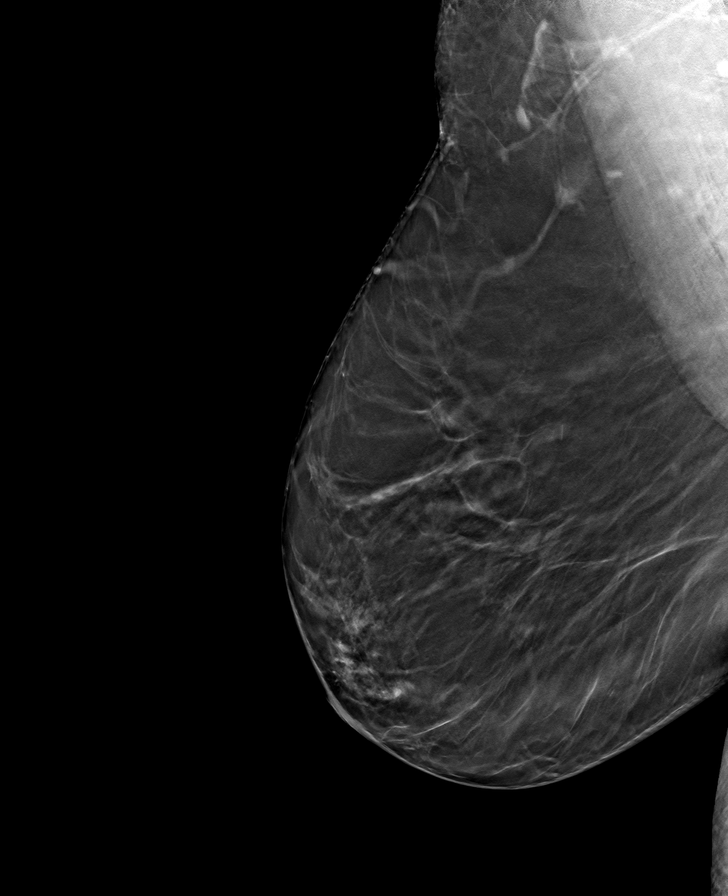

[L MLO tomo · tomo slice 37/74.0]
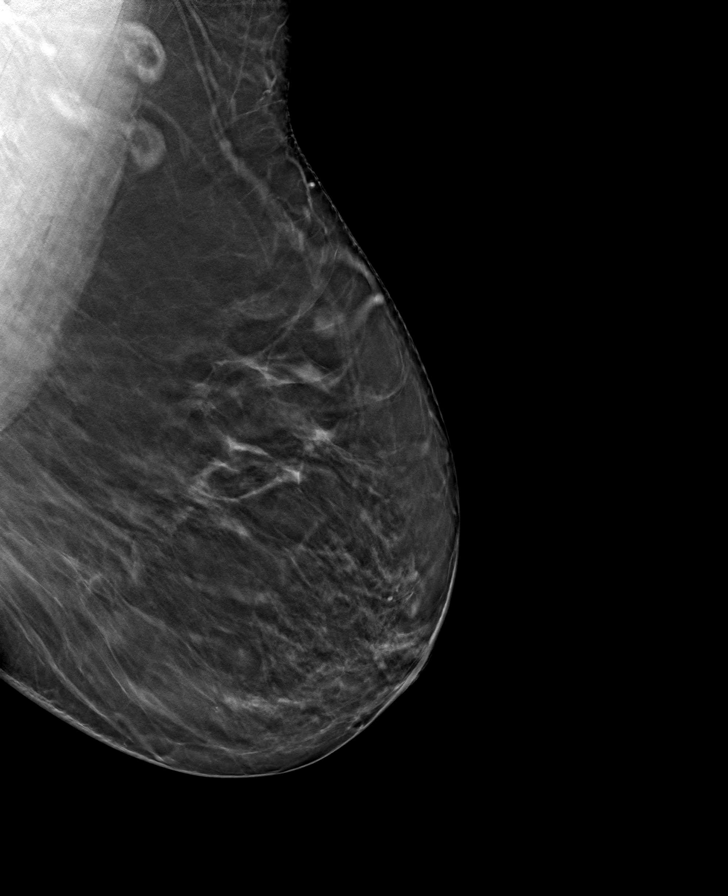

[L CC tomo · tomo slice 35/68.0]
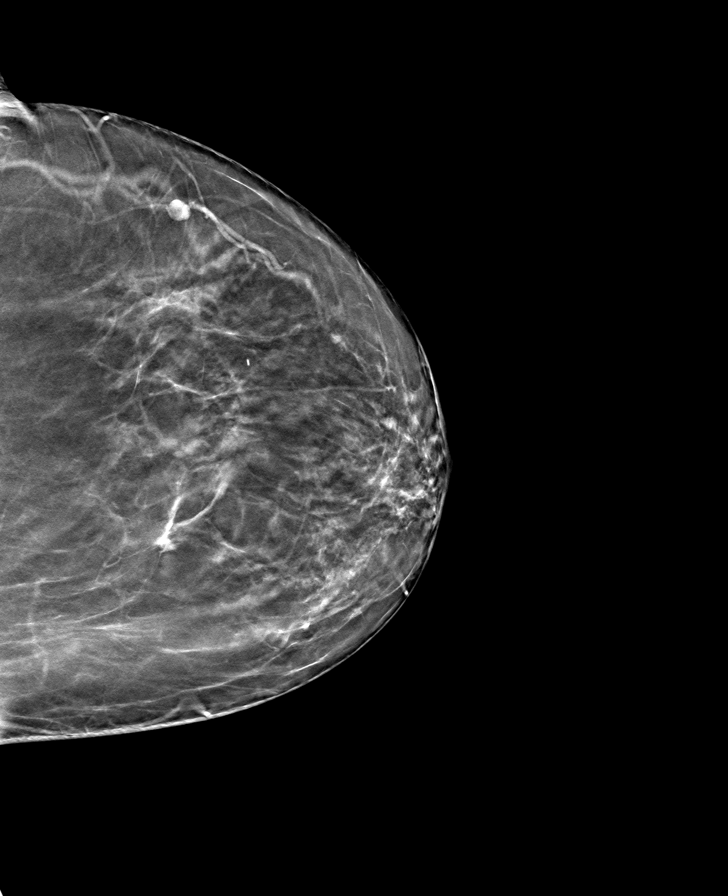

[8 of 24 positions shown; findings below may reference images not displayed]

ACR Breast Density Category b: There are scattered areas of
fibroglandular density.
FINDINGS: There are no findings suspicious for malignancy. Images were
processed with CAD.
IMPRESSION: No mammographic evidence of malignancy. A result letter of this
screening mammogram will be mailed directly to the patient.

RECOMMENDATION:
Screening mammogram in one year. (Code:[TQ])

BI-RADS CATEGORY  1: Negative.

## 2018-12-05 ENCOUNTER — Other Ambulatory Visit: Payer: Self-pay | Admitting: Family

## 2018-12-05 MED ORDER — TRULICITY 1.5 MG/0.5ML ~~LOC~~ SOAJ: SUBCUTANEOUS | 0 refills | Status: DC

## 2019-01-05 ENCOUNTER — Encounter: Payer: Self-pay | Admitting: Family

## 2019-01-05 ENCOUNTER — Ambulatory Visit (INDEPENDENT_AMBULATORY_CARE_PROVIDER_SITE_OTHER): Payer: BC Managed Care – PPO | Admitting: Family

## 2019-01-05 ENCOUNTER — Other Ambulatory Visit: Payer: Self-pay

## 2019-01-05 ENCOUNTER — Other Ambulatory Visit (INDEPENDENT_AMBULATORY_CARE_PROVIDER_SITE_OTHER): Payer: BC Managed Care – PPO

## 2019-01-05 VITALS — BP 122/76 | HR 84 | Temp 98.3°F | Ht 60.0 in | Wt 189.0 lb

## 2019-01-05 DIAGNOSIS — Z23 Encounter for immunization: Secondary | ICD-10-CM | POA: Diagnosis not present

## 2019-01-05 DIAGNOSIS — Z114 Encounter for screening for human immunodeficiency virus [HIV]: Secondary | ICD-10-CM

## 2019-01-05 DIAGNOSIS — M25561 Pain in right knee: Secondary | ICD-10-CM

## 2019-01-05 DIAGNOSIS — E118 Type 2 diabetes mellitus with unspecified complications: Secondary | ICD-10-CM

## 2019-01-05 DIAGNOSIS — M25562 Pain in left knee: Secondary | ICD-10-CM

## 2019-01-05 DIAGNOSIS — C189 Malignant neoplasm of colon, unspecified: Secondary | ICD-10-CM

## 2019-01-05 DIAGNOSIS — G8929 Other chronic pain: Secondary | ICD-10-CM

## 2019-01-05 LAB — COMPREHENSIVE METABOLIC PANEL
ALT: 20 U/L (ref 0–35)
AST: 22 U/L (ref 0–37)
Albumin: 4.3 g/dL (ref 3.5–5.2)
Alkaline Phosphatase: 76 U/L (ref 39–117)
BUN: 11 mg/dL (ref 6–23)
CO2: 27 mEq/L (ref 19–32)
Calcium: 8.9 mg/dL (ref 8.4–10.5)
Chloride: 108 mEq/L (ref 96–112)
Creatinine, Ser: 0.8 mg/dL (ref 0.40–1.20)
GFR: 88.58 mL/min (ref >60.00)
Glucose, Bld: 84 mg/dL (ref 70–99)
Potassium: 3.5 mEq/L (ref 3.5–5.1)
Sodium: 142 mEq/L (ref 135–145)
Total Bilirubin: 0.4 mg/dL (ref 0.2–1.2)
Total Protein: 6.9 g/dL (ref 6.0–8.3)

## 2019-01-05 LAB — HEMOGLOBIN A1C: Hgb A1c MFr Bld: 7.1 % — ABNORMAL HIGH (ref 4.6–6.5)

## 2019-01-05 LAB — MICROALBUMIN / CREATININE URINE RATIO
Creatinine,U: 362.1 mg/dL
Microalb Creat Ratio: 0.9 mg/g (ref 0.0–30.0)
Microalb, Ur: 3.1 mg/dL — ABNORMAL HIGH (ref 0.0–1.9)

## 2019-01-05 NOTE — [place] Notes (Signed)
[name] [name] is a 58 y.o. female with the following history as recorded in EpicCare:  Patient Active Problem List   Diagnosis Date Noted  . Left shoulder pain 07/10/2018  . Abdominal pain 07/03/2018  . Diarrhea 07/03/2018  . Nausea with vomiting 07/03/2018  . Urinary frequency 06/15/2016  . Type 2 diabetes with complication ([place]) 71/08/2692  . Hyperlipidemia associated with type 2 diabetes mellitus ([place]) 12/30/2012  . Obesity (BMI 30-39.9) 12/23/2012  . Colon cancer ([place]) 10/31/2011    Current Outpatient Medications  Medication Sig Dispense Refill  . atorvastatin (LIPITOR) 20 MG tablet Take 1 tablet (20 mg total) by mouth daily. 90 tablet 0  . Biotin 1 MG CAPS Take by mouth.    . Dulaglutide (TRULICITY) 1.5 WN/4.6EV SOPN Inject 1.5 milligram subcutaneously every week. 4 pen 0  . glucose blood test strip Use as instructed 100 each 12  . ibuprofen (ADVIL,MOTRIN) 200 MG tablet Take 400 mg by mouth every 6 (six) hours as needed for mild pain or moderate pain. Reported on 07/12/2015    . Lancets (ONETOUCH ULTRASOFT) lancets Use as instructed 100 each 12  . Multiple Vitamin (MULTIVITAMIN WITH MINERALS) TABS tablet Take 1 tablet by mouth daily.    . ondansetron (ZOFRAN) 4 MG tablet Take 1 tablet (4 mg total) by mouth every 8 (eight) hours as needed for nausea or vomiting. 20 tablet 0   No current facility-administered medications for this visit.     Allergies: Epinephrine, Lidocaine, and Metformin and related  Past Medical History:  Diagnosis Date  . Abdominal hernia    4 hernias per pt  . Abdominal pain    mid abd pain radiating to right side  . Anemia   . Cancer ([place])    rectal ca  . Change in bowel movement   . Colon cancer ([place])   . Complication of anesthesia    cardiac arrest during nasal surgery  . Diabetes mellitus without complication ([place])   . Headache   . History of rectal bleeding     Past Surgical History:  Procedure Laterality Date  . CESAREAN SECTION    .  colon cancer    . COLON SURGERY    . COLONOSCOPY    . LAPAROSCOPIC LYSIS OF ADHESIONS  04/02/2014   Procedure: LAPAROSCOPIC LYSIS OF ADHESIONS;  Surgeon: Jackolyn Confer, MD;  [facility];  Service: General;;  . NOSE SURGERY    . POLYPECTOMY    . VENTRAL HERNIA REPAIR N/A 04/02/2014   Procedure: LAPAROSCOPIC  LYSIS OF ADHESIONS AND REPAIR OF VENTRAL INCISIONAL HERNIAS WITH MESH;  Surgeon: Jackolyn Confer, MD;  Location: [facility];  Service: General;  Laterality: N/A;    Family History  Problem Relation Age of Onset  . Colon cancer Maternal Grandmother   . Colon cancer Maternal Aunt   . Uterine cancer Mother     Social History   Tobacco Use  . Smoking status: Former Smoker    Packs/day: 0.25    Years: 13.00    Pack years: 3.25    Types: Cigarettes    Quit date: 03/14/2003    Years since quitting: 15.8  . Smokeless tobacco: Never Used  Substance Use Topics  . Alcohol use: Yes    Alcohol/week: 0.0 standard drinks    Comment: occasional     Subjective:  Patient presents for 6 month follow-up on Type 2 Diabetes; currently taking Trulicity; also on Lipitor for cholesterol; no concerns with low blood sugar Overdue for yearly eye exam-  planning to get this scheduled; Denies any chest pain, shortness of breath, blurred vision or headache Agreeable to getting Prevnar updated today;  Does not want flu shot;     Objective:  Vitals:   01/05/19 1459  BP: 122/76  Pulse: 84  Temp: 98.3 F (36.8 C)  TempSrc: Oral  SpO2: 99%  Weight: 189 lb (85.7 kg)  Height: 5' (1.524 m)    General: Well developed, well nourished, in no acute distress  Skin : Warm and dry.  Head: Normocephalic and atraumatic  Lungs: Respirations unlabored; clear to auscultation bilaterally without wheeze, rales, rhonchi  CVS exam: normal rate and regular rhythm.  Musculoskeletal: No deformities; no active joint inflammation  Extremities: No edema, cyanosis, clubbing  Vessels: Symmetric bilaterally   Neurologic: Alert and oriented; speech intact; face symmetrical; moves all extremities well; CNII-XII intact without focal deficit   Assessment:  1. Type 2 diabetes with complication ([facility])   2. Encounter for screening for HIV   3. Malignant neoplasm of colon, unspecified part of colon ([place]) Chronic  4. Chronic pain of both knees     Plan:   1. Labs updated; continue same medications; plan to follow-up in 4-6 months; encouraged for yearly eye exam; 2. Lab updated; 3. Stable- doing very well; follow-up with specialists as scheduled. 4. Referral to orthopedist per patient request.   No follow-ups on file.  Orders Placed This Encounter  Procedures  . Pneumococcal conjugate vaccine 13-valent  . Comp Met (CMET)    Standing Status:   Future    Number of Occurrences:   1    Standing Expiration Date:   01/05/2020  . HgB A1c    Standing Status:   Future    Number of Occurrences:   1    Standing Expiration Date:   01/05/2020  . Urine Microalbumin w/creat. ratio    Standing Status:   Future    Number of Occurrences:   1    Standing Expiration Date:   01/05/2020  . HIV Antibody (routine testing w rflx)    Standing Status:   Future    Number of Occurrences:   1    Standing Expiration Date:   01/05/2020  . Ambulatory referral to Orthopedic Surgery    Referral Priority:   Routine    Referral Type:   Surgical    Referral Reason:   Specialty Services Required    Requested Specialty:   Orthopedic Surgery    Number of Visits Requested:   1    Requested Prescriptions    No prescriptions requested or ordered in this encounter

## 2019-01-06 ENCOUNTER — Other Ambulatory Visit: Payer: Self-pay | Admitting: Family

## 2019-01-06 LAB — HIV ANTIBODY (ROUTINE TESTING W REFLEX): HIV 1&2 Ab, 4th Generation: NONREACTIVE

## 2019-01-06 MED ORDER — LISINOPRIL 2.5 MG PO TABS: 2.5000 mg | ORAL_TABLET | Freq: Every day | ORAL | 1 refills | Status: DC

## 2019-01-08 ENCOUNTER — Other Ambulatory Visit: Payer: Self-pay | Admitting: Internal Medicine

## 2019-01-14 ENCOUNTER — Ambulatory Visit: Payer: Self-pay

## 2019-01-14 ENCOUNTER — Ambulatory Visit: Payer: BC Managed Care – PPO | Admitting: Orthopaedic Surgery

## 2019-01-14 ENCOUNTER — Other Ambulatory Visit: Payer: Self-pay

## 2019-01-14 VITALS — Ht 60.0 in | Wt 180.0 lb

## 2019-01-14 DIAGNOSIS — M25561 Pain in right knee: Secondary | ICD-10-CM

## 2019-01-14 DIAGNOSIS — M25562 Pain in left knee: Secondary | ICD-10-CM | POA: Diagnosis not present

## 2019-01-14 DIAGNOSIS — G8929 Other chronic pain: Secondary | ICD-10-CM

## 2019-01-14 MED ORDER — METHYLPREDNISOLONE ACETATE 40 MG/ML IJ SUSP
40.0000 mg | INTRAMUSCULAR | Status: AC | PRN
  Administered 2019-01-14: 40 mg via INTRA_ARTICULAR

## 2019-01-14 MED ORDER — LIDOCAINE HCL 1 % IJ SOLN
3.0000 mL | INTRAMUSCULAR | Status: AC | PRN
  Administered 2019-01-14: 3 mL

## 2019-01-14 MED ORDER — LIDOCAINE HCL 1 % IJ SOLN
3.0000 mL | INTRAMUSCULAR | Status: AC | PRN
  Administered 2019-01-14: 17:00:00 3 mL

## 2019-01-14 NOTE — Progress Notes (Signed)
Office Visit Note   Patient: [name] [name]           Date of Birth: [birth year]           MRN: [id number] Visit Date: 01/14/2019              Requested by: Marrian Salvage, [place],  [place] [address] PCP: Marrian Salvage, FNP   Assessment & Plan: Visit Diagnoses:  1. Chronic pain of left knee   2. Chronic pain of right knee     Plan: I talked her about weight loss and quad strengthening exercises.  I do feel that would help her quite a bit but also feel that bilateral knee steroid injections would be useful.  I described this in detail to her and talked about the risk and benefits of steroid injections as well as why would recommend this.  She agreed to try this and did tolerate them well.  She will work on quad strengthening exercises and activity modification.  I will also have her try Voltaren gel on each knee.  I would like to see her back in 4 weeks to see how she is doing overall.  All question concerns were answered and addressed.  Follow-Up Instructions: Return in about 4 weeks (around 02/11/2019).   Orders:  Orders Placed This Encounter  Procedures  . Large Joint Inj  . Large Joint Inj  . XR Knee 1-2 Views Left  . XR Knee 1-2 Views Right   No orders of the defined types were placed in this encounter.     Procedures: Large Joint Inj: R knee on 01/14/2019 4:53 PM Indications: diagnostic evaluation and pain Details: 22 G 1.5 in needle, superolateral approach  Arthrogram: No  Medications: 3 mL lidocaine 1 %; 40 mg methylPREDNISolone acetate 40 MG/ML Outcome: tolerated well, no immediate complications Procedure, treatment alternatives, risks and benefits explained, specific risks discussed. Consent was given by the patient. Immediately prior to procedure a time out was called to verify the correct patient, procedure, equipment, support staff and site/side marked as required. Patient was prepped and draped in the usual sterile fashion.    Large Joint Inj: L knee on 01/14/2019 4:53 PM Indications: diagnostic evaluation and pain Details: 22 G 1.5 in needle, superolateral approach  Arthrogram: No  Medications: 3 mL lidocaine 1 %; 40 mg methylPREDNISolone acetate 40 MG/ML Outcome: tolerated well, no immediate complications Procedure, treatment alternatives, risks and benefits explained, specific risks discussed. Consent was given by the patient. Immediately prior to procedure a time out was called to verify the correct patient, procedure, equipment, support staff and site/side marked as required. Patient was prepped and draped in the usual sterile fashion.       Clinical Data: No additional findings.   Subjective: Chief Complaint  Patient presents with  . Left Knee - Pain  . Right Knee - Pain  The patient comes in today for evaluation treatment of chronic bilateral knee pain.  She says is worse at night when she is getting in the bed and putting pressure on each knee.  The pain gets severe.  She has never had surgery and denies any injury to the knees.  She says been going on and off for about 2 to 3 years now and has been getting worse.  She has had swelling is on and off.  She has been having pain in both her thighs as well.  She does have a BMI of 35.15.  She  is not a diabetic.  She works all day long and does mainly sitdown work but her knees get stiff when she has been sitting for a while. HPI  Review of Systems She currently denies any headache, chest pain, shortness of breath, fever, chills, nausea, vomiting  Objective: Vital Signs: Ht 5' (1.524 m)   Wt 180 lb (81.6 kg)   BMI 35.15 kg/m   Physical Exam She is alert and orient x3 and in no acute distress Ortho Exam Examination of both knees have pain with compression of the patella at the patellofemoral joint.  Both knees are ligamentously stable with full range of motion both knees have no effusion today. Specialty Comments:  No specialty comments  available.  Imaging: Xr Knee 1-2 Views Left  Result Date: 01/14/2019 2 views of the left knee show no acute findings.  The medial lateral compartments are only slightly narrow.  There is patellofemoral arthritic changes.  Xr Knee 1-2 Views Right  Result Date: 01/14/2019 2 views of the right knee showed no acute findings.  The medial lateral compartment showed just slight narrowing.  There is patellofemoral arthritic changes.    PMFS History: Patient Active Problem List   Diagnosis Date Noted  . Left shoulder pain 07/10/2018  . Abdominal pain 07/03/2018  . Diarrhea 07/03/2018  . Nausea with vomiting 07/03/2018  . Urinary frequency 06/15/2016  . Type 2 diabetes with complication ([facility]) [id number]  . Hyperlipidemia associated with type 2 diabetes mellitus ([facility]) 12/30/2012  . Obesity (BMI 30-39.9) 12/23/2012  . Colon cancer ([facility]) 10/31/2011   Past Medical History:  Diagnosis Date  . Abdominal hernia    4 hernias per pt  . Abdominal pain    mid abd pain radiating to right side  . Anemia   . Cancer ([facility])    rectal ca  . Change in bowel movement   . Colon cancer ([place])   . Complication of anesthesia    cardiac arrest during nasal surgery  . Diabetes mellitus without complication ([facility])   . Headache   . History of rectal bleeding     Family History  Problem Relation Age of Onset  . Colon cancer Maternal Grandmother   . Colon cancer Maternal Aunt   . Uterine cancer Mother     Past Surgical History:  Procedure Laterality Date  . CESAREAN SECTION    . colon cancer    . COLON SURGERY    . COLONOSCOPY    . LAPAROSCOPIC LYSIS OF ADHESIONS  04/02/2014   Procedure: LAPAROSCOPIC LYSIS OF ADHESIONS;  Surgeon: Jackolyn Confer, MD;  Location: [facility];  Service: General;;  . NOSE SURGERY    . POLYPECTOMY    . VENTRAL HERNIA REPAIR N/A 04/02/2014   Procedure: LAPAROSCOPIC  LYSIS OF ADHESIONS AND REPAIR OF VENTRAL INCISIONAL HERNIAS WITH MESH;  Surgeon: Jackolyn Confer, MD;   Location: [facility];  Service: General;  Laterality: N/A;   Social History   Occupational History  . Occupation: Therapist, art  Tobacco Use  . Smoking status: Former Smoker    Packs/day: 0.25    Years: 13.00    Pack years: 3.25    Types: Cigarettes    Quit date: 03/14/2003    Years since quitting: 15.8  . Smokeless tobacco: Never Used  Substance and Sexual Activity  . Alcohol use: Yes    Alcohol/week: 0.0 standard drinks    Comment: occasional   . Drug use: No  . Sexual activity: Not Currently

## 2019-02-11 ENCOUNTER — Ambulatory Visit: Payer: BC Managed Care – PPO | Admitting: Orthopaedic Surgery

## 2019-02-11 ENCOUNTER — Other Ambulatory Visit: Payer: Self-pay

## 2019-02-11 ENCOUNTER — Encounter: Payer: Self-pay | Admitting: Orthopaedic Surgery

## 2019-02-11 DIAGNOSIS — M25561 Pain in right knee: Secondary | ICD-10-CM

## 2019-02-11 DIAGNOSIS — G8929 Other chronic pain: Secondary | ICD-10-CM

## 2019-02-11 DIAGNOSIS — M25562 Pain in left knee: Secondary | ICD-10-CM

## 2019-02-11 NOTE — Progress Notes (Signed)
HPI: Ms. [name] [name] returns today 4 weeks status post bilateral knee injections.  She is doing very well.  She has had no pain in either knee no mechanical symptoms.  She has no complaints.  Physical exam: Bilateral knees excellent range of motion without pain.  Ambulates without any assistive devices and a nonantalgic gait.  Impression: Bilateral knee pain  Plan: She will work on Forensic scientist.  She understands that she could have injections in both knees with cortisone every 3 months if needed.  She will follow-up with [place] as needed.  No charge for today's visit.

## 2019-02-21 ENCOUNTER — Other Ambulatory Visit: Payer: Self-pay | Admitting: Family

## 2019-03-24 ENCOUNTER — Other Ambulatory Visit: Payer: Self-pay | Admitting: Family

## 2019-03-25 ENCOUNTER — Other Ambulatory Visit: Payer: Self-pay | Admitting: Family

## 2019-03-25 MED ORDER — TRULICITY 1.5 MG/0.5ML ~~LOC~~ SOAJ: SUBCUTANEOUS | 0 refills | Status: DC

## 2019-04-10 ENCOUNTER — Other Ambulatory Visit: Payer: Self-pay

## 2019-04-10 ENCOUNTER — Ambulatory Visit: Payer: BC Managed Care – PPO | Admitting: Family

## 2019-04-10 ENCOUNTER — Ambulatory Visit (INDEPENDENT_AMBULATORY_CARE_PROVIDER_SITE_OTHER): Payer: 59 | Admitting: Family

## 2019-04-10 ENCOUNTER — Encounter: Payer: Self-pay | Admitting: Family

## 2019-04-10 VITALS — BP 130/76 | HR 83 | Temp 98.0°F | Ht 60.0 in | Wt 191.8 lb

## 2019-04-10 DIAGNOSIS — L304 Erythema intertrigo: Secondary | ICD-10-CM

## 2019-04-10 DIAGNOSIS — E118 Type 2 diabetes mellitus with unspecified complications: Secondary | ICD-10-CM

## 2019-04-10 MED ORDER — ATORVASTATIN CALCIUM 20 MG PO TABS: 20.0000 mg | ORAL_TABLET | Freq: Every day | ORAL | 3 refills | Status: DC

## 2019-04-10 MED ORDER — NYSTATIN-TRIAMCINOLONE 100000-0.1 UNIT/GM-% EX CREA: 1.0000 "application " | TOPICAL_CREAM | Freq: Two times a day (BID) | CUTANEOUS | 0 refills | Status: DC

## 2019-04-10 NOTE — Progress Notes (Signed)
[name] [name] is a 59 y.o. female with the following history as recorded in EpicCare:  Patient Active Problem List   Diagnosis Date Noted  . Left shoulder pain 07/10/2018  . Abdominal pain 07/03/2018  . Diarrhea 07/03/2018  . Nausea with vomiting 07/03/2018  . Urinary frequency 06/15/2016  . Type 2 diabetes with complication ([facility]) 96/06/5407  . Hyperlipidemia associated with type 2 diabetes mellitus ([facility]) 12/30/2012  . Obesity (BMI 30-39.9) 12/23/2012  . Colon cancer ([facility]) 10/31/2011    Current Outpatient Medications  Medication Sig Dispense Refill  . atorvastatin (LIPITOR) 20 MG tablet Take 1 tablet (20 mg total) by mouth daily. 90 tablet 3  . Biotin 1 MG CAPS Take by mouth.    . Dulaglutide (TRULICITY) 1.5 WJ/1.9JY SOPN Inject weekly as directed 12 pen 0  . glucose blood test strip Use as instructed 100 each 12  . ibuprofen (ADVIL,MOTRIN) 200 MG tablet Take 400 mg by mouth every 6 (six) hours as needed for mild pain or moderate pain. Reported on 07/12/2015    . Lancets (ONETOUCH ULTRASOFT) lancets Use as instructed 100 each 12  . Multiple Vitamin (MULTIVITAMIN WITH MINERALS) TABS tablet Take 1 tablet by mouth daily.    . ondansetron (ZOFRAN) 4 MG tablet TAKE 1 TABLET BY MOUTH EVERY 8 HOURS AS NEEDED FOR NAUSEA AND VOMITING 18 tablet 1  . nystatin-triamcinolone (MYCOLOG II) cream Apply 1 application topically 2 (two) times daily. 60 g 0   No current facility-administered medications for this visit.    Allergies: Epinephrine, Lidocaine, Lisinopril, and Metformin and related  Past Medical History:  Diagnosis Date  . Abdominal hernia    4 hernias per pt  . Abdominal pain    mid abd pain radiating to right side  . Anemia   . Cancer ([facility])    rectal ca  . Change in bowel movement   . Colon cancer ([facility])   . Complication of anesthesia    cardiac arrest during nasal surgery  . Diabetes mellitus without complication ([facility])   . Headache   . History of rectal bleeding     Past  Surgical History:  Procedure Laterality Date  . CESAREAN SECTION    . colon cancer    . COLON SURGERY    . COLONOSCOPY    . LAPAROSCOPIC LYSIS OF ADHESIONS  04/02/2014   Procedure: LAPAROSCOPIC LYSIS OF ADHESIONS;  Surgeon: Jackolyn Confer, MD;  [facility];  Service: General;;  . NOSE SURGERY    . POLYPECTOMY    . VENTRAL HERNIA REPAIR N/A 04/02/2014   Procedure: LAPAROSCOPIC  LYSIS OF ADHESIONS AND REPAIR OF VENTRAL INCISIONAL HERNIAS WITH MESH;  Surgeon: Jackolyn Confer, MD;  [facility];  Service: General;  Laterality: N/A;    Family History  Problem Relation Age of Onset  . Colon cancer Maternal Grandmother   . Colon cancer Maternal Aunt   . Uterine cancer Mother     Social History   Tobacco Use  . Smoking status: Former Smoker    Packs/day: 0.25    Years: 13.00    Pack years: 3.25    Types: Cigarettes    Quit date: 03/14/2003    Years since quitting: 16.0  . Smokeless tobacco: Never Used  Substance Use Topics  . Alcohol use: Yes    Alcohol/week: 0.0 standard drinks    Comment: occasional     Subjective:  3 month follow-up on Type 2 Diabetes; taking Trulicity weekly; does not have problems with low blood sugars;  Denies any chest pain, shortness of breath, blurred vision or headache Could not tolerate the Lisinopril due to itching;  Has been having increased rash in her groin/ under her breasts; using OTC Mycolog with limited relief;    Objective:  Vitals:   04/10/19 1618  BP: 130/76  Pulse: 83  Temp: 98 F (36.7 C)  TempSrc: Oral  SpO2: 98%  Weight: 191 lb 12.8 oz (87 kg)  Height: 5' (1.524 m)    General: Well developed, well nourished, in no acute distress  Skin : Warm and dry.  Head: Normocephalic and atraumatic  Lungs: Respirations unlabored; clear to auscultation bilaterally without wheeze, rales, rhonchi  CVS exam: normal rate and regular rhythm.  Neurologic: Alert and oriented; speech intact; face symmetrical; moves all extremities well;  CNII-XII intact without focal deficit   Assessment:  1. Type 2 diabetes with complication (HCC)   2. Intertrigo     Plan:  Update labs today; continue Trulicity; Trial of Mycolog II apply bid to affected area; discussed need to keep area dry;   This visit occurred during the SARS-CoV-2 public health emergency.  Safety protocols were in place, including screening questions prior to the visit, additional usage of staff PPE, and extensive cleaning of exam room while observing appropriate contact time as indicated for disinfecting solutions.     No follow-ups on file.  Orders Placed This Encounter  Procedures  . Comp Met (CMET)  . HgB A1c  . Urine Microalbumin w/creat. ratio  . Comp Met (CMET)    Standing Status:   Future    Number of Occurrences:   1    Standing Expiration Date:   04/09/2020  . HgB A1c    Standing Status:   Future    Number of Occurrences:   1    Standing Expiration Date:   04/09/2020  . Urine Microalbumin w/creat. ratio    Standing Status:   Future    Number of Occurrences:   1    Standing Expiration Date:   04/09/2020    Requested Prescriptions   Signed Prescriptions Disp Refills  . nystatin-triamcinolone (MYCOLOG II) cream 60 g 0    Sig: Apply 1 application topically 2 (two) times daily.  Marland Kitchen atorvastatin (LIPITOR) 20 MG tablet 90 tablet 3    Sig: Take 1 tablet (20 mg total) by mouth daily.

## 2019-04-11 LAB — COMPREHENSIVE METABOLIC PANEL
AG Ratio: 1.7 (calc) (ref 1.0–2.5)
ALT: 15 U/L (ref 6–29)
AST: 16 U/L (ref 10–35)
Albumin: 4.1 g/dL (ref 3.6–5.1)
Alkaline phosphatase (APISO): 68 U/L (ref 37–153)
BUN: 10 mg/dL (ref 7–25)
CO2: 27 mmol/L (ref 20–32)
Calcium: 9.1 mg/dL (ref 8.6–10.4)
Chloride: 106 mmol/L (ref 98–110)
Creat: 0.97 mg/dL (ref 0.50–0.99)
Globulin: 2.4 g/dL (calc) (ref 1.9–3.7)
Glucose, Bld: 136 mg/dL — ABNORMAL HIGH (ref 65–99)
Potassium: 3.8 mmol/L (ref 3.5–5.3)
Sodium: 142 mmol/L (ref 135–146)
Total Bilirubin: 0.3 mg/dL (ref 0.2–1.2)
Total Protein: 6.5 g/dL (ref 6.1–8.1)

## 2019-04-11 LAB — HEMOGLOBIN A1C
Hgb A1c MFr Bld: 7 % of total Hgb — ABNORMAL HIGH (ref <5.7)
Mean Plasma Glucose: 154 (calc)
eAG (mmol/L): 8.5 (calc)

## 2019-04-11 LAB — MICROALBUMIN / CREATININE URINE RATIO
Creatinine, Urine: 342 mg/dL — ABNORMAL HIGH (ref 20–275)
Microalb Creat Ratio: 9 mcg/mg creat (ref <30)
Microalb, Ur: 3 mg/dL

## 2019-04-20 ENCOUNTER — Telehealth: Payer: Self-pay

## 2019-04-20 NOTE — Telephone Encounter (Signed)
Referral Request - Has patient seen PCP for this complaint? yes *If NO, is insurance requiring patient see PCP for this issue before PCP can refer them? Referral for which specialty: ENT Preferred provider/office:  [facility] at [address] Reason for referral:   History of tubes in her nose due to issues with adenoids. Currently having issues with snoring. Has not seen a ENT since getting tubes in nose.

## 2019-04-21 ENCOUNTER — Other Ambulatory Visit: Payer: Self-pay | Admitting: Family

## 2019-04-21 DIAGNOSIS — R0683 Snoring: Secondary | ICD-10-CM

## 2019-05-26 ENCOUNTER — Encounter: Payer: Self-pay | Admitting: Internal Medicine

## 2019-07-02 ENCOUNTER — Institutional Professional Consult (permissible substitution): Payer: 59 | Admitting: Internal Medicine

## 2019-08-19 ENCOUNTER — Encounter: Payer: Self-pay | Admitting: Pulmonary Disease

## 2019-08-19 ENCOUNTER — Ambulatory Visit: Payer: 59 | Admitting: Pulmonary Disease

## 2019-08-19 ENCOUNTER — Other Ambulatory Visit: Payer: Self-pay

## 2019-08-19 VITALS — BP 138/66 | HR 83 | Temp 97.9°F | Ht 60.0 in | Wt 185.6 lb

## 2019-08-19 DIAGNOSIS — G4733 Obstructive sleep apnea (adult) (pediatric): Secondary | ICD-10-CM

## 2019-08-19 DIAGNOSIS — R0683 Snoring: Secondary | ICD-10-CM | POA: Diagnosis not present

## 2019-08-19 NOTE — Progress Notes (Signed)
[name] [name]    [id number]    05-21-1960  Primary Care Physician:Murray, Marvis Repress, [place]  Referring Physician: Izora Gala, MD [address] [place],  [place] [address]  Chief complaint:   Significant history of snoring  HPI:  Last sleep study was about 15 years ago Does not remember being told that she has obstructive sleep apnea  Her concern remains significant snoring  She has had to adapt to try not to fall asleep in the presence of other people because of significant snoring She usually goes to bed between 1030 and 11 Falls asleep in about 30 minutes Does not usually wake up, sometimes she has a glass of water to drink Final wake up time 6 AM Usually does not get dry in the morning as she takes sips of water if she wakes up dry No headaches  No excessive night sweats  Memory is good  No family history of obstructive sleep apnea known to her  Weight is stable  Has tried multiple interventions over-the-counter that have not helped  We did talk about options of treatment including an oral device, surgical interventions, inspire device  She had been evaluated at some point in the past and was having nasal surgery performed during which she sustained a cardiorespiratory arrest  Outpatient Encounter Medications as of 08/19/2019  Medication Sig  . atorvastatin (LIPITOR) 20 MG tablet Take 1 tablet (20 mg total) by mouth daily.  . Biotin 1 MG CAPS Take by mouth.  . Dulaglutide (TRULICITY) 1.5 IR/4.4RX SOPN Inject weekly as directed  . glucose blood test strip Use as instructed  . ibuprofen (ADVIL,MOTRIN) 200 MG tablet Take 400 mg by mouth every 6 (six) hours as needed for mild pain or moderate pain. Reported on 07/12/2015  . Lancets (ONETOUCH ULTRASOFT) lancets Use as instructed  . Multiple Vitamin (MULTIVITAMIN WITH MINERALS) TABS tablet Take 1 tablet by mouth daily.  Marland Kitchen nystatin-triamcinolone (MYCOLOG II) cream Apply 1 application  topically 2 (two) times daily.  . ondansetron (ZOFRAN) 4 MG tablet TAKE 1 TABLET BY MOUTH EVERY 8 HOURS AS NEEDED FOR NAUSEA AND VOMITING   No facility-administered encounter medications on file as of 08/19/2019.    Allergies as of 08/19/2019 - Review Complete 08/19/2019  Allergen Reaction Noted  . Epinephrine Anaphylaxis 07/12/2010  . Lidocaine Anaphylaxis 07/12/2010  . Lisinopril  04/10/2019  . Metformin and related  07/14/2018    Past Medical History:  Diagnosis Date  . Abdominal hernia    4 hernias per pt  . Abdominal pain    mid abd pain radiating to right side  . Anemia   . Cancer ([facility])    rectal ca  . Change in bowel movement   . Colon cancer ([facility])   . Complication of anesthesia    cardiac arrest during nasal surgery  . Diabetes mellitus without complication ([facility])   . Headache   . History of rectal bleeding     Past Surgical History:  Procedure Laterality Date  . CESAREAN SECTION    . colon cancer    . COLON SURGERY    . COLONOSCOPY    . LAPAROSCOPIC LYSIS OF ADHESIONS  04/02/2014   Procedure: LAPAROSCOPIC LYSIS OF ADHESIONS;  Surgeon: Jackolyn Confer, MD;  [facility];  Service: General;;  . NOSE SURGERY    . POLYPECTOMY    . VENTRAL HERNIA REPAIR N/A 04/02/2014   Procedure: LAPAROSCOPIC  LYSIS OF ADHESIONS AND REPAIR OF  VENTRAL INCISIONAL HERNIAS WITH MESH;  Surgeon: Jackolyn Confer, MD;  [facility];  Service: General;  Laterality: N/A;    Family History  Problem Relation Age of Onset  . Colon cancer Maternal Grandmother   . Colon cancer Maternal Aunt   . Uterine cancer Mother     Social History   Socioeconomic History  . Marital status: Single    Spouse name: Not on file  . Number of children: 3  . Years of education: 38  . Highest education level: Not on file  Occupational History  . Occupation: Therapist, art  Tobacco Use  . Smoking status: Former Smoker    Packs/day: 0.25    Years: 13.00    Pack years: 3.25    Types:  Cigarettes    Quit date: 03/14/2003    Years since quitting: 16.4  . Smokeless tobacco: Never Used  Substance and Sexual Activity  . Alcohol use: Yes    Alcohol/week: 0.0 standard drinks    Comment: occasional   . Drug use: No  . Sexual activity: Not Currently  Other Topics Concern  . Not on file  Social History Narrative   Fun: Camping, Cruise    Denies abuse and feels safe at home.    Social Determinants of Health   Financial Resource Strain:   . Difficulty of Paying Living Expenses:   Food Insecurity:   . Worried About Charity fundraiser in the Last Year:   . Arboriculturist in the Last Year:   Transportation Needs:   . Film/video editor (Medical):   Marland Kitchen Lack of Transportation (Non-Medical):   Physical Activity:   . Days of Exercise per Week:   . Minutes of Exercise per Session:   Stress:   . Feeling of Stress :   Social Connections:   . Frequency of Communication with Friends and Family:   . Frequency of Social Gatherings with Friends and Family:   . Attends Religious Services:   . Active Member of Clubs or Organizations:   . Attends Archivist Meetings:   Marland Kitchen Marital Status:   Intimate Partner Violence:   . Fear of Current or Ex-Partner:   . Emotionally Abused:   Marland Kitchen Physically Abused:   . Sexually Abused:     Review of Systems  Constitutional: Negative.   HENT: Negative.   Cardiovascular: Negative.   Psychiatric/Behavioral: Positive for sleep disturbance.    Vitals:   08/19/19 1559  BP: 138/66  Pulse: 83  Temp: 97.9 F (36.6 C)  SpO2: 100%     Physical Exam Constitutional:      Appearance: She is obese.  HENT:     Nose: Nose normal.     Mouth/Throat:     Mouth: Mucous membranes are moist.     Pharynx: No oropharyngeal exudate.  Eyes:     General:        Right eye: No discharge.        Left eye: No discharge.     Conjunctiva/sclera: Conjunctivae normal.  Cardiovascular:     Rate and Rhythm: Normal rate and regular rhythm.      Pulses: Normal pulses.     Heart sounds: Normal heart sounds. No murmur heard.  No friction rub.  Pulmonary:     Effort: Pulmonary effort is normal. No respiratory distress.     Breath sounds: Normal breath sounds. No stridor. No wheezing or rhonchi.  Musculoskeletal:     Cervical back: No rigidity or tenderness.  Neurological:     General: No focal deficit present.     Mental Status: She is alert.  Psychiatric:        Mood and Affect: Mood normal.    Results of the Epworth flowsheet 08/19/2019  Sitting and reading 2  Watching TV 2  Sitting, inactive in a public place (e.g. a theatre or a meeting) 1  As a passenger in a car for an hour without a break 0  Lying down to rest in the afternoon when circumstances permit 0  Sitting and talking to someone 0  Sitting quietly after a lunch without alcohol 1  In a car, while stopped for a few minutes in traffic 0  Total score 6    Assessment:  Significant history of snoring -Behavioral modifications have not helped -Over-the-counter interventions have not helped  Moderate possibility of obstructive sleep apnea  Pathophysiology of obstructive sleep apnea discussed with the patient Treatment options for sleep disordered breathing discussed with the patient  Plan/Recommendations: Risk of not treating sleep disordered breathing discussed with the patient  We will schedule the patient for home sleep study  Options of treatment will include CPAP therapy An oral device Evaluation for an inspire device also discussed  Follow-up in 2 months   Sherrilyn Rist MD [facility] 08/19/2019, 4:27 PM  CC: Izora Gala, MD

## 2019-08-19 NOTE — Addendum Note (Signed)
Addended by: Tery Sanfilippo R on: 08/19/2019 04:35 PM   Modules accepted: Orders

## 2019-08-19 NOTE — Patient Instructions (Signed)
Moderate probability of significant obstructive sleep apnea Significant snoring history  We will schedule you for home sleep study We will update you with results  Options of treatment as discussed -Evaluation by dentist for an oral device will be an option -Evaluation for an inspire device would be an option  We will follow-up with you in about 2 months  Call with significant concerns Sleep Apnea Sleep apnea is a condition in which breathing pauses or becomes shallow during sleep. Episodes of sleep apnea usually last 10 seconds or longer, and they may occur as many as 20 times an hour. Sleep apnea disrupts your sleep and keeps your body from getting the rest that it needs. This condition can increase your risk of certain health problems, including:  Heart attack.  Stroke.  Obesity.  Diabetes.  Heart failure.  Irregular heartbeat. What are the causes? There are three kinds of sleep apnea:  Obstructive sleep apnea. This kind is caused by a blocked or collapsed airway.  Central sleep apnea. This kind happens when the part of the brain that controls breathing does not send the correct signals to the muscles that control breathing.  Mixed sleep apnea. This is a combination of obstructive and central sleep apnea. The most common cause of this condition is a collapsed or blocked airway. An airway can collapse or become blocked if:  Your throat muscles are abnormally relaxed.  Your tongue and tonsils are larger than normal.  You are overweight.  Your airway is smaller than normal. What increases the risk? You are more likely to develop this condition if you:  Are overweight.  Smoke.  Have a smaller than normal airway.  Are elderly.  Are female.  Drink alcohol.  Take sedatives or tranquilizers.  Have a family history of sleep apnea. What are the signs or symptoms? Symptoms of this condition include:  Trouble staying asleep.  Daytime sleepiness and  tiredness.  Irritability.  Loud snoring.  Morning headaches.  Trouble concentrating.  Forgetfulness.  Decreased interest in sex.  Unexplained sleepiness.  Mood swings.  Personality changes.  Feelings of depression.  Waking up often during the night to urinate.  Dry mouth.  Sore throat. How is this diagnosed? This condition may be diagnosed with:  A medical history.  A physical exam.  A series of tests that are done while you are sleeping (sleep study). These tests are usually done in a sleep lab, but they may also be done at home. How is this treated? Treatment for this condition aims to restore normal breathing and to ease symptoms during sleep. It may involve managing health issues that can affect breathing, such as high blood pressure or obesity. Treatment may include:  Sleeping on your side.  Using a decongestant if you have nasal congestion.  Avoiding the use of depressants, including alcohol, sedatives, and narcotics.  Losing weight if you are overweight.  Making changes to your diet.  Quitting smoking.  Using a device to open your airway while you sleep, such as: ? An oral appliance. This is a custom-made mouthpiece that shifts your lower jaw forward. ? A continuous positive airway pressure (CPAP) device. This device blows air through a mask when you breathe out (exhale). ? A nasal expiratory positive airway pressure (EPAP) device. This device has valves that you put into each nostril. ? A bi-level positive airway pressure (BPAP) device. This device blows air through a mask when you breathe in (inhale) and breathe out (exhale).  Having surgery if other  treatments do not work. During surgery, excess tissue is removed to create a wider airway. It is important to get treatment for sleep apnea. Without treatment, this condition can lead to:  High blood pressure.  Coronary artery disease.  In men, an inability to achieve or maintain an erection  (impotence).  Reduced thinking abilities. Follow these instructions at home: Lifestyle  Make any lifestyle changes that your health care provider recommends.  Eat a healthy, well-balanced diet.  Take steps to lose weight if you are overweight.  Avoid using depressants, including alcohol, sedatives, and narcotics.  Do not use any products that contain nicotine or tobacco, such as cigarettes, e-cigarettes, and chewing tobacco. If you need help quitting, ask your health care provider. General instructions  Take over-the-counter and prescription medicines only as told by your health care provider.  If you were given a device to open your airway while you sleep, use it only as told by your health care provider.  If you are having surgery, make sure to tell your health care provider you have sleep apnea. You may need to bring your device with you.  Keep all follow-up visits as told by your health care provider. This is important. Contact a health care provider if:  The device that you received to open your airway during sleep is uncomfortable or does not seem to be working.  Your symptoms do not improve.  Your symptoms get worse. Get help right away if:  You develop: ? Chest pain. ? Shortness of breath. ? Discomfort in your back, arms, or stomach.  You have: ? Trouble speaking. ? Weakness on one side of your body. ? Drooping in your face. These symptoms may represent a serious problem that is an emergency. Do not wait to see if the symptoms will go away. Get medical help right away. Call your local emergency services (911 in the [place] Do not drive yourself to the hospital. Summary  Sleep apnea is a condition in which breathing pauses or becomes shallow during sleep.  The most common cause is a collapsed or blocked airway.  The goal of treatment is to restore normal breathing and to ease symptoms during sleep. This information is not intended to replace advice given to you  by your health care provider. Make sure you discuss any questions you have with your health care provider. Document Revised: 08/06/2018 Document Reviewed: 10/15/2017 Elsevier Patient Education  [place].

## 2019-09-16 ENCOUNTER — Other Ambulatory Visit: Payer: Self-pay | Admitting: Family

## 2019-09-16 MED ORDER — TRULICITY 1.5 MG/0.5ML ~~LOC~~ SOAJ: SUBCUTANEOUS | 0 refills | Status: DC

## 2019-09-24 ENCOUNTER — Other Ambulatory Visit: Payer: Self-pay

## 2019-09-24 ENCOUNTER — Ambulatory Visit: Payer: No Typology Code available for payment source

## 2019-09-24 DIAGNOSIS — G4733 Obstructive sleep apnea (adult) (pediatric): Secondary | ICD-10-CM | POA: Diagnosis not present

## 2019-09-29 ENCOUNTER — Telehealth (HOSPITAL_COMMUNITY): Payer: Self-pay | Admitting: Pulmonary Disease

## 2019-09-29 DIAGNOSIS — G4733 Obstructive sleep apnea (adult) (pediatric): Secondary | ICD-10-CM

## 2019-09-29 NOTE — Telephone Encounter (Signed)
Call patient  Sleep study result  Date of study: 09/24/2019  Impression: Mild obstructive sleep apnea Mild oxygen desaturations  Recommendation: Options of treatment for mild sleep apnea include CPAP therapy may be considered Auto titrating CPAP of 5-15 will be appropriate  An oral device may be considered as an option of treatment

## 2019-10-02 NOTE — Telephone Encounter (Signed)
Patient contacted with results of home sleep study. Patient prefers to get an oral device. Referral to dentistry ordered. Patient verbalized understanding of results and the need for a follow up visit.

## 2019-10-16 ENCOUNTER — Telehealth: Payer: Self-pay | Admitting: Primary Care

## 2019-10-16 DIAGNOSIS — G4733 Obstructive sleep apnea (adult) (pediatric): Secondary | ICD-10-CM

## 2019-10-16 NOTE — Telephone Encounter (Signed)
Pt would like update on when she will be referred to ortho. Please advise.

## 2019-10-16 NOTE — Telephone Encounter (Signed)
Telephone note opened in error

## 2019-10-16 NOTE — Telephone Encounter (Signed)
Spoke with patient, she wanted an update on the referral to Dr. Ron Parker regarding the oral appliance.  Can someone check on this referral and let the patient know?  Thank you.

## 2019-10-16 NOTE — Telephone Encounter (Addendum)
Order did not hit [facility] workque the way it was placed.  Checked with Lauren about order.  She put in new order under BW's name since AO will not be here until next week.  BW completed referral form and I have faxed it to [facility]' office.  I called pt & made her aware referral was sent and to give their office a little longer to contact her.  Nothing further needed.

## 2019-12-08 ENCOUNTER — Telehealth: Payer: Self-pay | Admitting: Family

## 2019-12-08 ENCOUNTER — Telehealth (INDEPENDENT_AMBULATORY_CARE_PROVIDER_SITE_OTHER): Payer: No Typology Code available for payment source | Admitting: Family Medicine

## 2019-12-08 DIAGNOSIS — R399 Unspecified symptoms and signs involving the genitourinary system: Secondary | ICD-10-CM

## 2019-12-08 DIAGNOSIS — R739 Hyperglycemia, unspecified: Secondary | ICD-10-CM

## 2019-12-08 DIAGNOSIS — E118 Type 2 diabetes mellitus with unspecified complications: Secondary | ICD-10-CM | POA: Diagnosis not present

## 2019-12-08 MED ORDER — NITROFURANTOIN MONOHYD MACRO 100 MG PO CAPS: 100.0000 mg | ORAL_CAPSULE | Freq: Two times a day (BID) | ORAL | 0 refills | Status: DC

## 2019-12-08 NOTE — Telephone Encounter (Signed)
Would need OV;

## 2019-12-08 NOTE — Progress Notes (Signed)
Virtual Visit via Video Note  I connected with Brayden  on 12/08/19 at  5:40 PM EDT by a video enabled telemedicine application and verified that I am speaking with the correct person using two identifiers.  Location patient: home Location provider:work or home office Persons participating in the virtual visit: patient, provider  I discussed the limitations of evaluation and management by telemedicine and the availability of in person appointments. The patient expressed understanding and agreed to proceed.   HPI:  Acute telemedicine visit for Dysuria: -Onset: 3-4 days ago -Symptoms include: cloudy urine, odor to urine -Denies: fevers, flank pain, dysuria, nausea, vomiting, burning with urination, urination frequency and urgency -has a history of urinary tract infections -Has tried: nothing -Pertinent medication allergies:listed in her allergy list -has diabetes - BS has been in the 130s-190s, high today when ate wrong, reports usually pretty good though  ROS: See pertinent positives and negatives per HPI.  Past Medical History:  Diagnosis Date  . Abdominal hernia    4 hernias per pt  . Abdominal pain    mid abd pain radiating to right side  . Anemia   . Cancer ([place])    rectal ca  . Change in bowel movement   . Colon cancer ([place])   . Complication of anesthesia    cardiac arrest during nasal surgery  . Diabetes mellitus without complication ([place])   . Headache   . History of rectal bleeding     Past Surgical History:  Procedure Laterality Date  . CESAREAN SECTION    . colon cancer    . COLON SURGERY    . COLONOSCOPY    . LAPAROSCOPIC LYSIS OF ADHESIONS  04/02/2014   Procedure: LAPAROSCOPIC LYSIS OF ADHESIONS;  Surgeon: Jackolyn Confer, MD;  Location: [facility];  Service: General;;  . NOSE SURGERY    . POLYPECTOMY    . VENTRAL HERNIA REPAIR N/A 04/02/2014   Procedure: LAPAROSCOPIC  LYSIS OF ADHESIONS AND REPAIR OF VENTRAL INCISIONAL HERNIAS WITH MESH;  Surgeon: Jackolyn Confer, MD;  Location: [facility];  Service: General;  Laterality: N/A;     Current Outpatient Medications:  .  atorvastatin (LIPITOR) 20 MG tablet, Take 1 tablet (20 mg total) by mouth daily., Disp: 90 tablet, Rfl: 3 .  Biotin 1 MG CAPS, Take by mouth., Disp: , Rfl:  .  Dulaglutide (TRULICITY) 1.5 YK/9.9IP SOPN, Inject weekly as directed, Disp: 4 pen, Rfl: 0 .  glucose blood test strip, Use as instructed, Disp: 100 each, Rfl: 12 .  ibuprofen (ADVIL,MOTRIN) 200 MG tablet, Take 400 mg by mouth every 6 (six) hours as needed for mild pain or moderate pain. Reported on 07/12/2015, Disp: , Rfl:  .  Lancets (ONETOUCH ULTRASOFT) lancets, Use as instructed, Disp: 100 each, Rfl: 12 .  Multiple Vitamin (MULTIVITAMIN WITH MINERALS) TABS tablet, Take 1 tablet by mouth daily., Disp: , Rfl:  .  nystatin-triamcinolone (MYCOLOG II) cream, Apply 1 application topically 2 (two) times daily., Disp: 60 g, Rfl: 0 .  ondansetron (ZOFRAN) 4 MG tablet, TAKE 1 TABLET BY MOUTH EVERY 8 HOURS AS NEEDED FOR NAUSEA AND VOMITING, Disp: 18 tablet, Rfl: 1 .  nitrofurantoin, macrocrystal-monohydrate, (MACROBID) 100 MG capsule, Take 1 capsule (100 mg total) by mouth 2 (two) times daily., Disp: 14 capsule, Rfl: 0  EXAM:  VITALS per patient if applicable:  GENERAL: alert, oriented, appears well and in no acute distress  HEENT: atraumatic, conjunttiva clear, no obvious abnormalities on inspection of external nose and ears  NECK: normal movements of the head and neck  LUNGS: on inspection no signs of respiratory distress, breathing rate appears normal, no obvious gross SOB, gasping or wheezing  CV: no obvious cyanosis  MS: moves all visible extremities without noticeable abnormality  PSYCH/NEURO: pleasant and cooperative, no obvious depression or anxiety, speech and thought processing grossly intact  ASSESSMENT AND PLAN:  Discussed the following assessment and plan:  Urinary symptom or sign  Type 2 diabetes with  complication (HCC)  Hyperglycemia  -we discussed possible serious and likely etiologies, options for evaluation and workup, limitations of telemedicine visit vs in person visit, treatment, treatment risks and precautions. Pt prefers to treat via telemedicine empirically rather than in person at this moment. Query early UTI vs other. She prefers to treat with empiric macrobid as has had this before and feels sure it is an infection. Discussed risks of empiric tx. She agrees to seek inperson care if worsening or not improving. Also discussed importance of drinking plenty of water and good BS control and eating a low sugar healthy diet.  Advised to seek prompt follow up telemedicine visit or in person care if worsening, new symptoms arise, or if is not improving with treatment. Did let this patient know that I only do telemedicine on Tuesdays and Thursdays for [place]. Advised to schedule follow up visit with PCP or [facility] if any further questions or concerns to avoid delays in care.   I discussed the assessment and treatment plan with the patient. The patient was provided an opportunity to ask questions and all were answered. The patient agreed with the plan and demonstrated an understanding of the instructions.     Lucretia Kern, DO

## 2019-12-08 NOTE — Telephone Encounter (Signed)
   Patient calling to report cloudy urine x1 day, requesting "one pill antibiotic" Declined to schedule office visit

## 2019-12-08 NOTE — Patient Instructions (Signed)
-  I sent the medication(s) we discussed to your pharmacy: Meds ordered this encounter  Medications  . nitrofurantoin, macrocrystal-monohydrate, (MACROBID) 100 MG capsule    Sig: Take 1 capsule (100 mg total) by mouth 2 (two) times daily.    Dispense:  14 capsule    Refill:  0    Drink plenty of water.  Avoid sweetened beverages.  Eat a healthy low sugar diet.    I hope you are feeling better soon! Seek care promptly if your symptoms worsen, new concerns arise or you are not improving with treatment.

## 2019-12-08 NOTE — Telephone Encounter (Signed)
[facility] Report/Call : --Caller states her urine is cloudy for about 3/4 days. No pain with urination. States she does not have any urgency or frequency. No fever. Having lower back pain.   Advised see PCP within 24 hours. Appointment has been made at another location.

## 2019-12-08 NOTE — Telephone Encounter (Signed)
Left patient a voicemail that we need an ov for prescriptions to be sent in.

## 2019-12-11 ENCOUNTER — Other Ambulatory Visit: Payer: Self-pay | Admitting: Family

## 2019-12-11 MED ORDER — TRULICITY 1.5 MG/0.5ML ~~LOC~~ SOAJ: SUBCUTANEOUS | 1 refills | Status: DC

## 2019-12-14 ENCOUNTER — Other Ambulatory Visit: Payer: Self-pay | Admitting: Family

## 2019-12-14 DIAGNOSIS — Z1231 Encounter for screening mammogram for malignant neoplasm of breast: Secondary | ICD-10-CM

## 2020-01-08 ENCOUNTER — Ambulatory Visit: Payer: No Typology Code available for payment source

## 2020-01-11 ENCOUNTER — Ambulatory Visit
Admission: RE | Admit: 2020-01-11 | Discharge: 2020-01-11 | Disposition: A | Discharge status: Home / Self Care | Payer: No Typology Code available for payment source | Source: Ambulatory Visit | Attending: Family | Admitting: Family

## 2020-01-11 ENCOUNTER — Other Ambulatory Visit: Payer: Self-pay

## 2020-01-11 DIAGNOSIS — Z1231 Encounter for screening mammogram for malignant neoplasm of breast: Secondary | ICD-10-CM

## 2020-01-11 IMAGING — MG DIGITAL SCREENING BILAT W/ TOMO W/ CAD
6 of 10 series · 6 of 30 positions shown · non-contrast
Comparison: Previous exam(s).

CLINICAL DATA: Screening.

EXAM:
DIGITAL SCREENING BILATERAL MAMMOGRAM WITH TOMO AND CAD

[L CC synth-2D]
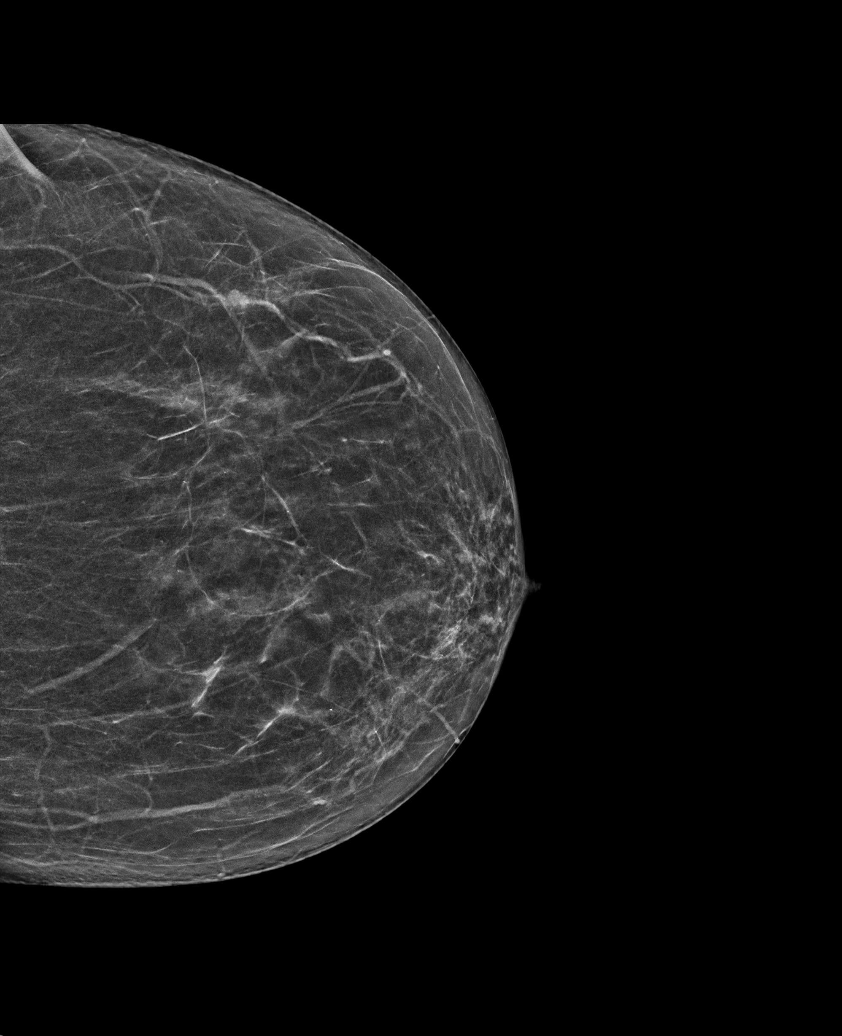

[L MLO synth-2D]
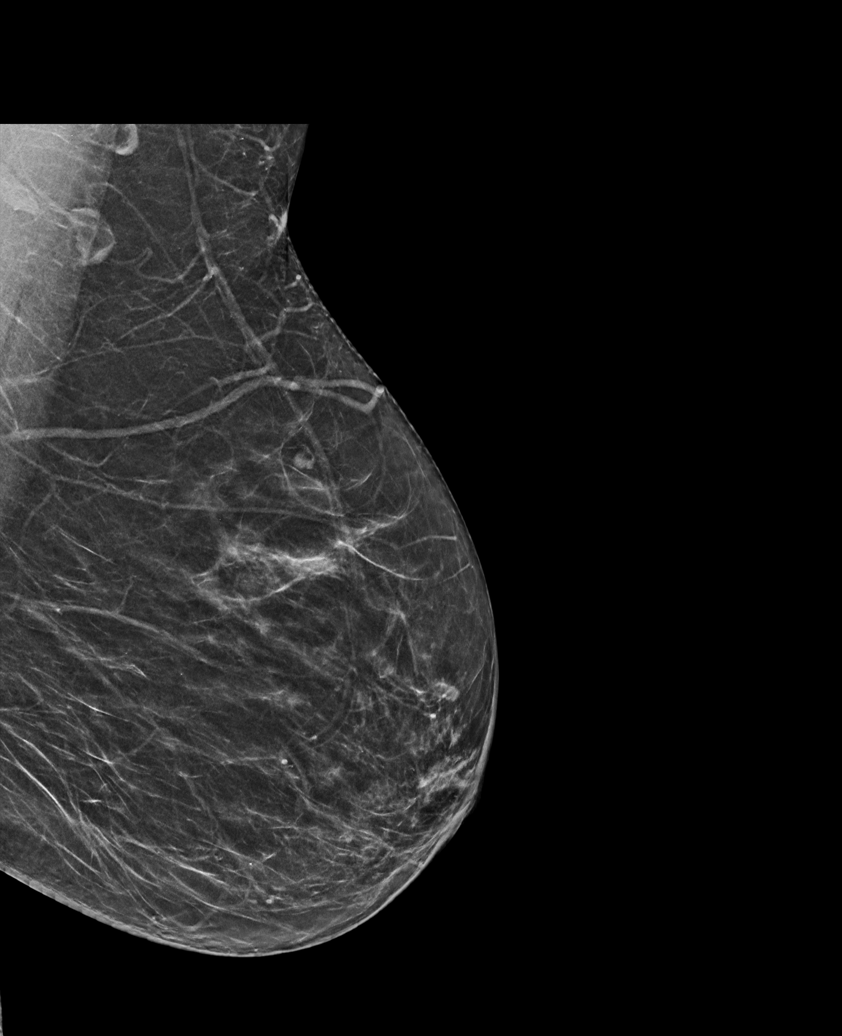

[R MLO synth-2D]
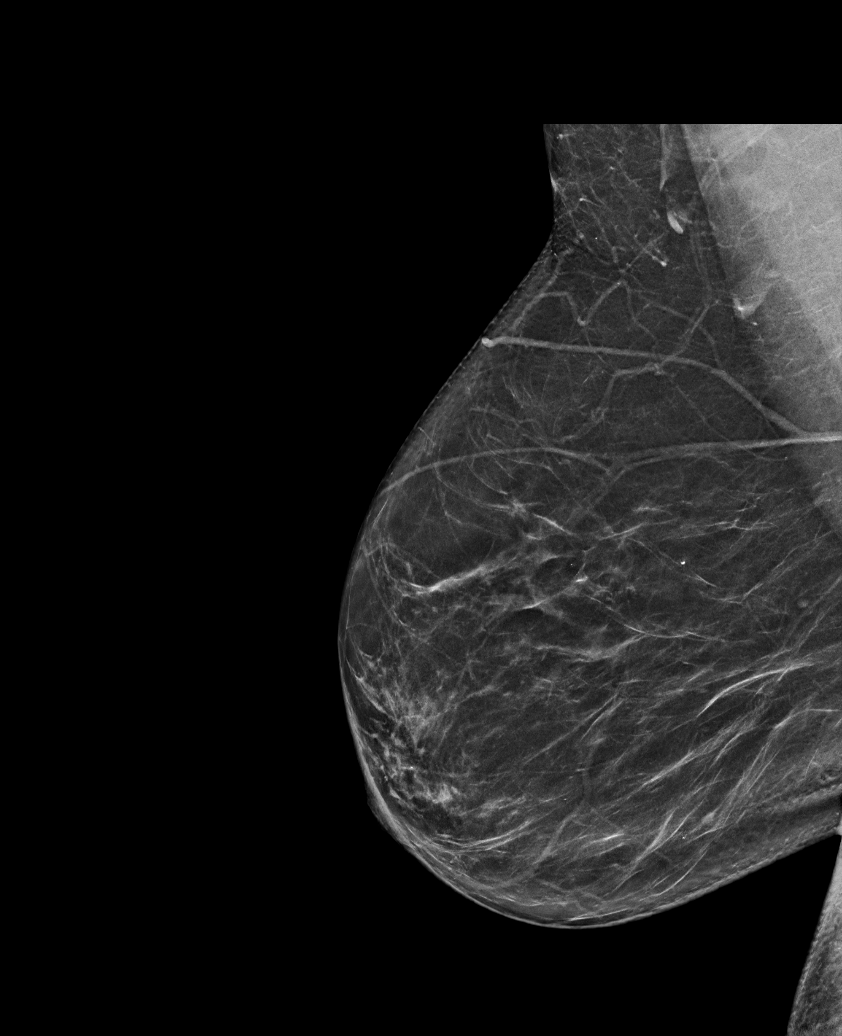

[R CC synth-2D (1 of 2)]
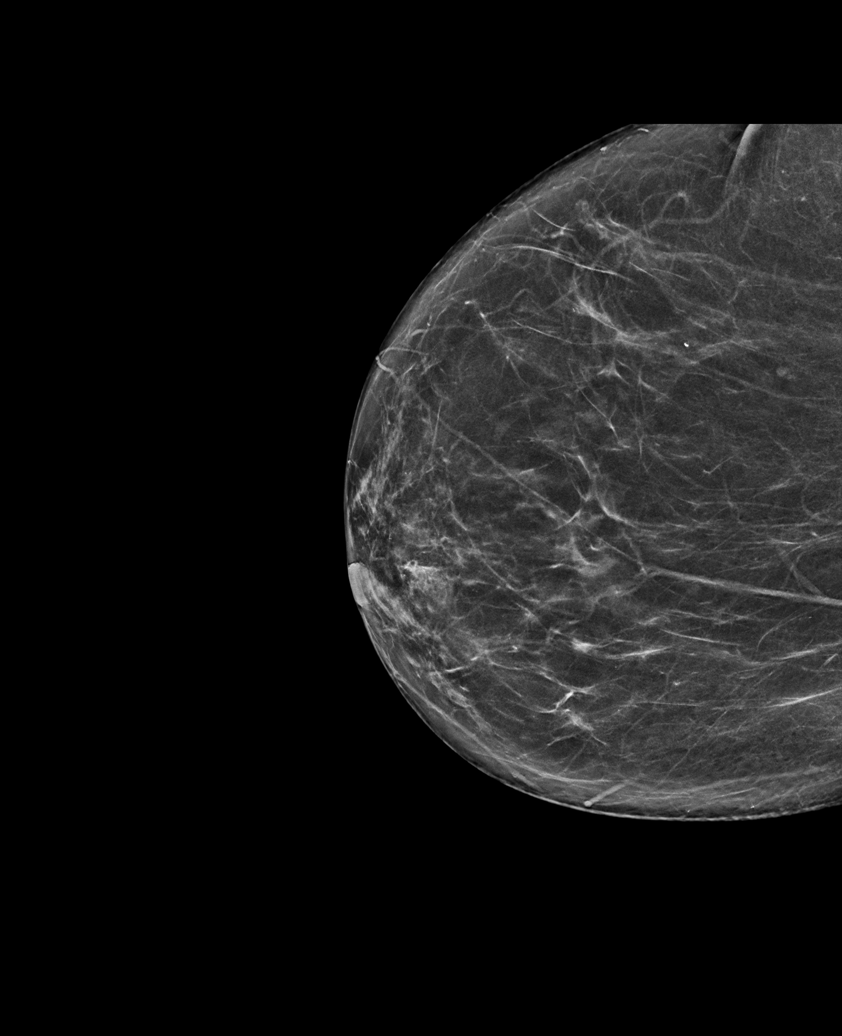

[R CC synth-2D (2 of 2)]
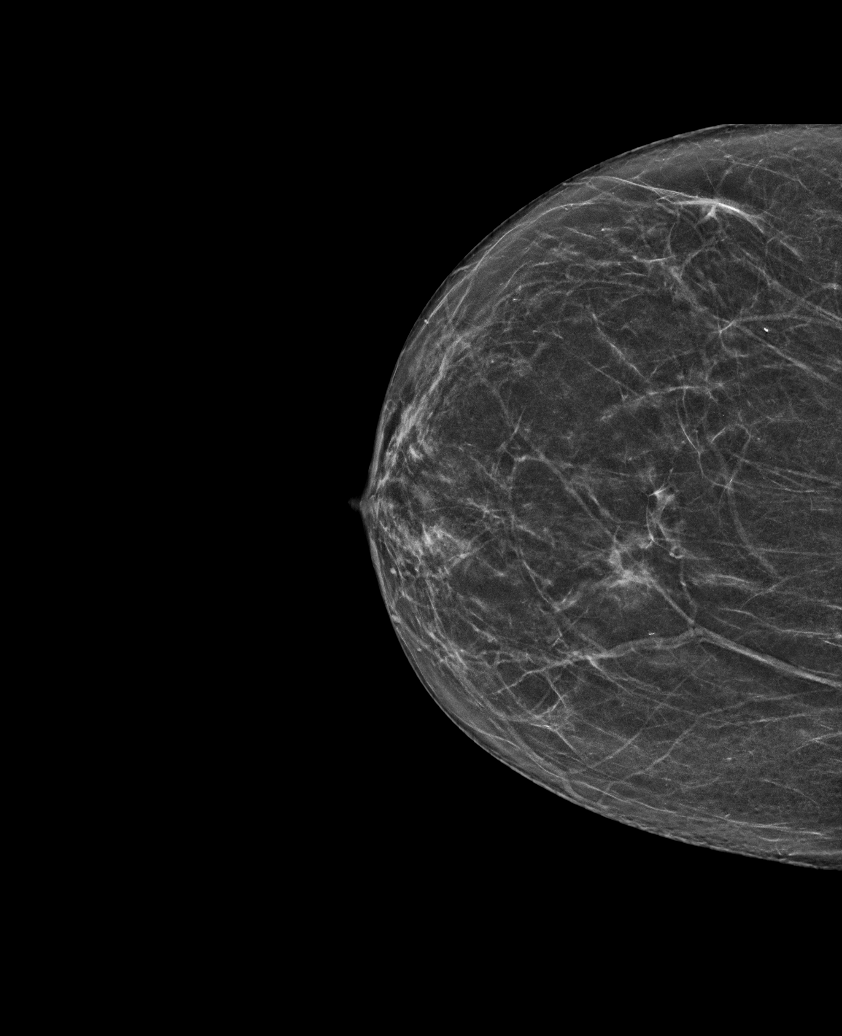

[L MLO tomo · tomo slice 33/65.0]
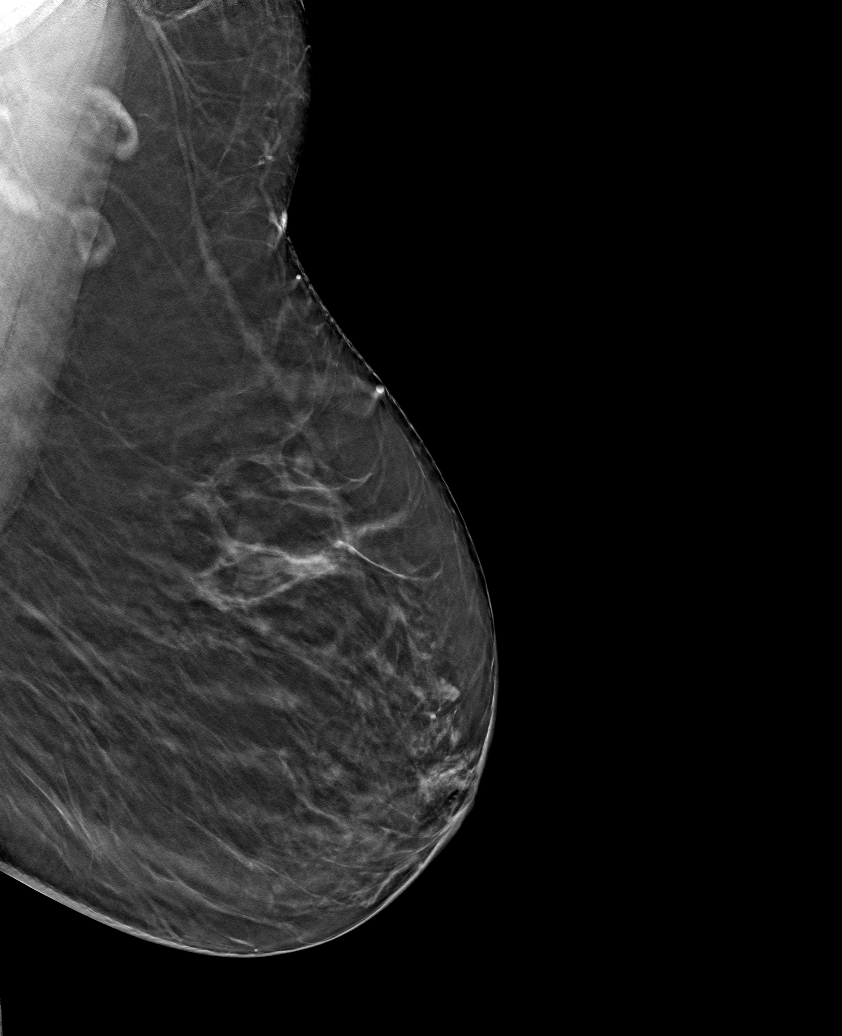

[6 of 30 positions shown; findings below may reference images not displayed]

ACR Breast Density Category b: There are scattered areas of
fibroglandular density.
FINDINGS: There are no findings suspicious for malignancy. Images were
processed with CAD.
IMPRESSION: No mammographic evidence of malignancy. A result letter of this
screening mammogram will be mailed directly to the patient.

RECOMMENDATION:
Screening mammogram in one year. (Code:[TQ])

BI-RADS CATEGORY  1: Negative.

## 2020-03-03 ENCOUNTER — Other Ambulatory Visit: Payer: Self-pay | Admitting: Family

## 2020-03-28 ENCOUNTER — Telehealth: Payer: Self-pay | Admitting: Family

## 2020-04-04 ENCOUNTER — Other Ambulatory Visit: Payer: Self-pay | Admitting: Family

## 2020-04-06 NOTE — Telephone Encounter (Signed)
    Patient requesting refill. She has no TRULICITY 1.5 SR/1.5XY SOPN  Appointment 04/19/20

## 2020-04-08 MED ORDER — TRULICITY 1.5 MG/0.5ML ~~LOC~~ SOAJ: SUBCUTANEOUS | 0 refills | Status: DC

## 2020-04-08 NOTE — Telephone Encounter (Signed)
Pt notified of Laura's response & confirms that she will be at 04/19/20 appt.

## 2020-04-08 NOTE — Telephone Encounter (Signed)
She must keep appointment in 04/19/20; one month refill sent in for her.

## 2020-04-08 NOTE — Addendum Note (Signed)
Addended by: Sherlene Shams on: 04/08/2020 09:24 AM   Modules accepted: Orders

## 2020-04-19 ENCOUNTER — Other Ambulatory Visit (INDEPENDENT_AMBULATORY_CARE_PROVIDER_SITE_OTHER): Payer: No Typology Code available for payment source

## 2020-04-19 ENCOUNTER — Telehealth (INDEPENDENT_AMBULATORY_CARE_PROVIDER_SITE_OTHER): Payer: No Typology Code available for payment source | Admitting: Family

## 2020-04-19 DIAGNOSIS — E1169 Type 2 diabetes mellitus with other specified complication: Secondary | ICD-10-CM | POA: Diagnosis not present

## 2020-04-19 DIAGNOSIS — E785 Hyperlipidemia, unspecified: Secondary | ICD-10-CM

## 2020-04-19 DIAGNOSIS — E118 Type 2 diabetes mellitus with unspecified complications: Secondary | ICD-10-CM | POA: Diagnosis not present

## 2020-04-19 LAB — COMPREHENSIVE METABOLIC PANEL
ALT: 12 U/L (ref 0–35)
AST: 13 U/L (ref 0–37)
Albumin: 4.1 g/dL (ref 3.5–5.2)
Alkaline Phosphatase: 67 U/L (ref 39–117)
BUN: 8 mg/dL (ref 6–23)
CO2: 27 mEq/L (ref 19–32)
Calcium: 8.8 mg/dL (ref 8.4–10.5)
Chloride: 108 mEq/L (ref 96–112)
Creatinine, Ser: 0.83 mg/dL (ref 0.40–1.20)
GFR: 76.25 mL/min (ref >60.00)
Glucose, Bld: 71 mg/dL (ref 70–99)
Potassium: 3.7 mEq/L (ref 3.5–5.1)
Sodium: 141 mEq/L (ref 135–145)
Total Bilirubin: 0.4 mg/dL (ref 0.2–1.2)
Total Protein: 6.8 g/dL (ref 6.0–8.3)

## 2020-04-19 LAB — CBC WITH DIFFERENTIAL/PLATELET
Basophils Absolute: 0 10*3/uL (ref 0.0–0.1)
Basophils Relative: 0.5 % (ref 0.0–3.0)
Eosinophils Absolute: 0.1 10*3/uL (ref 0.0–0.7)
Eosinophils Relative: 1.9 % (ref 0.0–5.0)
HCT: 37.1 % (ref 36.0–46.0)
Hemoglobin: 11.6 g/dL — ABNORMAL LOW (ref 12.0–15.0)
Lymphocytes Relative: 56.2 % — ABNORMAL HIGH (ref 12.0–46.0)
Lymphs Abs: 2.5 10*3/uL (ref 0.7–4.0)
MCHC: 31.3 g/dL (ref 30.0–36.0)
MCV: 70.3 fl — ABNORMAL LOW (ref 78.0–100.0)
Monocytes Absolute: 0.4 10*3/uL (ref 0.1–1.0)
Monocytes Relative: 8.5 % (ref 3.0–12.0)
Neutro Abs: 1.5 10*3/uL (ref 1.4–7.7)
Neutrophils Relative %: 32.9 % — ABNORMAL LOW (ref 43.0–77.0)
Platelets: 227 10*3/uL (ref 150.0–400.0)
RBC: 5.28 Mil/uL — ABNORMAL HIGH (ref 3.87–5.11)
RDW: 14.7 % (ref 11.5–15.5)
WBC: 4.5 10*3/uL (ref 4.0–10.5)

## 2020-04-19 LAB — LIPID PANEL
Cholesterol: 141 mg/dL (ref 0–200)
HDL: 35.5 mg/dL — ABNORMAL LOW (ref >39.00)
LDL Cholesterol: 67 mg/dL (ref 0–99)
NonHDL: 105.04
Total CHOL/HDL Ratio: 4
Triglycerides: 191 mg/dL — ABNORMAL HIGH (ref 0.0–149.0)
VLDL: 38.2 mg/dL (ref 0.0–40.0)

## 2020-04-19 LAB — HEMOGLOBIN A1C: Hgb A1c MFr Bld: 7.1 % — ABNORMAL HIGH (ref 4.6–6.5)

## 2020-04-19 NOTE — Progress Notes (Signed)
[name] [name] is a 60 y.o. female with the following history as recorded in EpicCare:  Patient Active Problem List   Diagnosis Date Noted  . Left shoulder pain 07/10/2018  . Abdominal pain 07/03/2018  . Diarrhea 07/03/2018  . Nausea with vomiting 07/03/2018  . Urinary frequency 06/15/2016  . Type 2 diabetes with complication ([place]) 03/00/9233  . Hyperlipidemia associated with type 2 diabetes mellitus ([place]) 12/30/2012  . Obesity (BMI 30-39.9) 12/23/2012  . Colon cancer ([place]) 10/31/2011    Current Outpatient Medications  Medication Sig Dispense Refill  . atorvastatin (LIPITOR) 20 MG tablet TAKE 1 TABLET BY MOUTH EVERY DAY 90 tablet 0  . Dulaglutide (TRULICITY) 1.5 AQ/7.6AU SOPN INJECT WEEKLY AS DIRECTED 2 mL 0  . Multiple Vitamin (MULTIVITAMIN WITH MINERALS) TABS tablet Take 1 tablet by mouth daily.    . Biotin 1 MG CAPS Take by mouth.    Marland Kitchen glucose blood test strip Use as instructed 100 each 12  . ibuprofen (ADVIL,MOTRIN) 200 MG tablet Take 400 mg by mouth every 6 (six) hours as needed for mild pain or moderate pain. Reported on 07/12/2015    . Lancets (ONETOUCH ULTRASOFT) lancets Use as instructed 100 each 12   No current facility-administered medications for this visit.    Allergies: Epinephrine, Lidocaine, Lisinopril, and Metformin and related  Past Medical History:  Diagnosis Date  . Abdominal hernia    4 hernias per pt  . Abdominal pain    mid abd pain radiating to right side  . Anemia   . Cancer ([place])    rectal ca  . Change in bowel movement   . Colon cancer ([place])   . Complication of anesthesia    cardiac arrest during nasal surgery  . Diabetes mellitus without complication ([place])   . Headache   . History of rectal bleeding     Past Surgical History:  Procedure Laterality Date  . CESAREAN SECTION    . colon cancer    . COLON SURGERY    . COLONOSCOPY    . LAPAROSCOPIC LYSIS OF ADHESIONS  04/02/2014   Procedure: LAPAROSCOPIC LYSIS OF ADHESIONS;  Surgeon: Jackolyn Confer, MD;  Location: [facility];  Service: General;;  . NOSE SURGERY    . POLYPECTOMY    . VENTRAL HERNIA REPAIR N/A 04/02/2014   Procedure: LAPAROSCOPIC  LYSIS OF ADHESIONS AND REPAIR OF VENTRAL INCISIONAL HERNIAS WITH MESH;  Surgeon: Jackolyn Confer, MD;  Location: [facility];  Service: General;  Laterality: N/A;    Family History  Problem Relation Age of Onset  . Colon cancer Maternal Grandmother   . Colon cancer Maternal Aunt   . Uterine cancer Mother   . Breast cancer Paternal Aunt   . Breast cancer Cousin   . Breast cancer Cousin     Social History   Tobacco Use  . Smoking status: Former Smoker    Packs/day: 0.25    Years: 13.00    Pack years: 3.25    Types: Cigarettes    Quit date: 03/14/2003    Years since quitting: 17.1  . Smokeless tobacco: Never Used  Substance Use Topics  . Alcohol use: Yes    Alcohol/week: 0.0 standard drinks    Comment: occasional     Subjective:    I connected with [name] on 04/19/20 at 11:20 AM EST by a telephone call and verified that I am speaking with the correct person using two identifiers.   I discussed the limitations of evaluation and management by telemedicine  and the availability of in person appointments. The patient expressed understanding and agreed to proceed. Provider in office/ patient is at home; provider and patient are only 2 people on telephone call.   Visit was originally scheduled in person for yearly follow-up; was converted to virtual due to provider having to work remote; Patient needs and agrees to go ahead and get yearly labs done today for her Type 2 Diabetes and cholesterol; Denies any chest pain, shortness of breath, blurred vision or headache; no concerns for low blood sugar- fasting blood sugar averages 135;  Got married this past Saturday; very excited and will be taking honeymoon in early May; will plan for colonoscopy once she gets back;    Objective:  There were no vitals filed for this visit.    Lungs: Respirations unlabored;  Neurologic: Alert and oriented; speech intact;   Assessment:  1. Type 2 diabetes with complication (HCC)   2. Hyperlipidemia associated with type 2 diabetes mellitus ([place])     Plan:  Patient will go for yearly labs today and plan for in office visit in the next 1-2 months; will adjust Trulicity RX after seeing today's labs;  Time spent 12 minutes  No follow-ups on file.  Orders Placed This Encounter  Procedures  . CBC with Differential/Platelet    Standing Status:   Future    Standing Expiration Date:   04/19/2021  . Comp Met (CMET)    Standing Status:   Future    Standing Expiration Date:   04/19/2021  . Lipid panel    Standing Status:   Future    Standing Expiration Date:   04/19/2021  . Hemoglobin A1c    Standing Status:   Future    Standing Expiration Date:   04/19/2021  . Urine Microalbumin w/creat. ratio    Standing Status:   Future    Standing Expiration Date:   04/19/2021    Requested Prescriptions    No prescriptions requested or ordered in this encounter

## 2020-04-20 LAB — MICROALBUMIN / CREATININE URINE RATIO
Creatinine,U: 176.5 mg/dL
Microalb Creat Ratio: 1 mg/g (ref 0.0–30.0)
Microalb, Ur: 1.7 mg/dL (ref 0.0–1.9)

## 2020-04-22 NOTE — Progress Notes (Signed)
Mildly anemic- will re-check at upcoming in person OV;  Stay on Atorvastatin; Hgba1c stable at 7.1; I would like to see her in person in a month or so as we discussed.

## 2020-05-02 ENCOUNTER — Telehealth: Payer: Self-pay | Admitting: Family

## 2020-05-02 MED ORDER — TRULICITY 1.5 MG/0.5ML ~~LOC~~ SOAJ: SUBCUTANEOUS | 3 refills | Status: DC

## 2020-05-02 NOTE — Telephone Encounter (Signed)
Pt notified of Laura's message.  Pt states she will find an otc iron to take in the meantime b/c she feels fatigued & cold all the time.  Pt confirmed upcoming OV for 3/14.

## 2020-05-02 NOTE — Telephone Encounter (Signed)
1.Medication Requested: Dulaglutide (TRULICITY) 1.5 KD/9.8PJ SOPN    2. Pharmacy (Name, Street, [place]): [id number] - , [place]  3. On Med List: yes   4. Last Visit with PCP: 2.15.22  5. Next visit date with PCP: 3.14.22  Patient was also wondering if Iron pills could be called in. Please advise    Agent: Please be advised that RX refills may take up to 3 business days. We ask that you follow-up with your pharmacy.

## 2020-05-02 NOTE — Telephone Encounter (Signed)
Her lab results don't support the need for an iron prescription at this time. We will plan to do a full panel to evaluate her ferritin and iron stores at her upcoming OV.

## 2020-05-16 ENCOUNTER — Encounter: Payer: Self-pay | Admitting: Family

## 2020-05-16 ENCOUNTER — Ambulatory Visit: Payer: No Typology Code available for payment source | Admitting: Family

## 2020-05-16 ENCOUNTER — Other Ambulatory Visit: Payer: Self-pay

## 2020-05-16 VITALS — BP 122/70 | HR 85 | Temp 98.2°F | Ht 60.0 in | Wt 183.4 lb

## 2020-05-16 DIAGNOSIS — E118 Type 2 diabetes mellitus with unspecified complications: Secondary | ICD-10-CM | POA: Diagnosis not present

## 2020-05-16 DIAGNOSIS — Z85038 Personal history of other malignant neoplasm of large intestine: Secondary | ICD-10-CM

## 2020-05-16 DIAGNOSIS — D649 Anemia, unspecified: Secondary | ICD-10-CM | POA: Diagnosis not present

## 2020-05-16 DIAGNOSIS — Z1211 Encounter for screening for malignant neoplasm of colon: Secondary | ICD-10-CM

## 2020-05-16 DIAGNOSIS — L304 Erythema intertrigo: Secondary | ICD-10-CM | POA: Diagnosis not present

## 2020-05-16 MED ORDER — FLUCONAZOLE 100 MG PO TABS: 100.0000 mg | ORAL_TABLET | Freq: Every day | ORAL | 0 refills | Status: DC

## 2020-05-16 MED ORDER — TRULICITY 1.5 MG/0.5ML ~~LOC~~ SOAJ: SUBCUTANEOUS | 2 refills | Status: DC

## 2020-05-16 MED ORDER — NYSTATIN-TRIAMCINOLONE 100000-0.1 UNIT/GM-% EX CREA: 1.0000 "application " | TOPICAL_CREAM | Freq: Two times a day (BID) | CUTANEOUS | 1 refills | Status: DC

## 2020-05-16 NOTE — Progress Notes (Signed)
[name] [name] is a 60 y.o. female with the following history as recorded in EpicCare:  Patient Active Problem List   Diagnosis Date Noted  . Left shoulder pain 07/10/2018  . Abdominal pain 07/03/2018  . Diarrhea 07/03/2018  . Nausea with vomiting 07/03/2018  . Urinary frequency 06/15/2016  . Type 2 diabetes with complication ([place]) 41/28/7867  . Hyperlipidemia associated with type 2 diabetes mellitus ([place]) 12/30/2012  . Obesity (BMI 30-39.9) 12/23/2012  . Colon cancer ([place]) 10/31/2011    Current Outpatient Medications  Medication Sig Dispense Refill  . fluconazole (DIFLUCAN) 100 MG tablet Take 1 tablet (100 mg total) by mouth daily. 10 tablet 0  . nystatin-triamcinolone (MYCOLOG II) cream Apply 1 application topically 2 (two) times daily. 60 g 1  . atorvastatin (LIPITOR) 20 MG tablet TAKE 1 TABLET BY MOUTH EVERY DAY 90 tablet 0  . Biotin 1 MG CAPS Take by mouth.    . Dulaglutide (TRULICITY) 1.5 EH/2.0NO SOPN INJECT WEEKLY AS DIRECTED 6 mL 2  . glucose blood test strip Use as instructed 100 each 12  . ibuprofen (ADVIL,MOTRIN) 200 MG tablet Take 400 mg by mouth every 6 (six) hours as needed for mild pain or moderate pain. Reported on 07/12/2015    . Lancets (ONETOUCH ULTRASOFT) lancets Use as instructed 100 each 12  . Multiple Vitamin (MULTIVITAMIN WITH MINERALS) TABS tablet Take 1 tablet by mouth daily.     No current facility-administered medications for this visit.    Allergies: Epinephrine, Lidocaine, Lisinopril, and Metformin and related  Past Medical History:  Diagnosis Date  . Abdominal hernia    4 hernias per pt  . Abdominal pain    mid abd pain radiating to right side  . Anemia   . Cancer ([place])    rectal ca  . Change in bowel movement   . Colon cancer ([place])   . Complication of anesthesia    cardiac arrest during nasal surgery  . Diabetes mellitus without complication ([place])   . Headache   . History of rectal bleeding     Past Surgical History:  Procedure  Laterality Date  . CESAREAN SECTION    . colon cancer    . COLON SURGERY    . COLONOSCOPY    . LAPAROSCOPIC LYSIS OF ADHESIONS  04/02/2014   Procedure: LAPAROSCOPIC LYSIS OF ADHESIONS;  Surgeon: Jackolyn Confer, MD;  Location: [facility];  Service: General;;  . NOSE SURGERY    . POLYPECTOMY    . VENTRAL HERNIA REPAIR N/A 04/02/2014   Procedure: LAPAROSCOPIC  LYSIS OF ADHESIONS AND REPAIR OF VENTRAL INCISIONAL HERNIAS WITH MESH;  Surgeon: Jackolyn Confer, MD;  Location: [facility];  Service: General;  Laterality: N/A;    Family History  Problem Relation Age of Onset  . Colon cancer Maternal Grandmother   . Colon cancer Maternal Aunt   . Uterine cancer Mother   . Breast cancer Paternal Aunt   . Breast cancer Cousin   . Breast cancer Cousin     Social History   Tobacco Use  . Smoking status: Former Smoker    Packs/day: 0.25    Years: 13.00    Pack years: 3.25    Types: Cigarettes    Quit date: 03/14/2003    Years since quitting: 17.1  . Smokeless tobacco: Never Used  Substance Use Topics  . Alcohol use: Yes    Alcohol/week: 0.0 standard drinks    Comment: occasional     Subjective:   Increased "itchy skin" on abdominal/  groin area for the past 2-3 months; denies any new soaps, foods, detergents or medications. Some relief with topical "anti-itch powder." Also having recurrent intermittent abdominal pain- has been present since surgery for colon cancer; CT done in 2019 did not show any abnormalities; due for colonoscopy in May; feels like she has never been able to use the restroom "easily" after having surgery done;   Objective:  Vitals:   05/16/20 1602  BP: 122/70  Pulse: 85  Temp: 98.2 F (36.8 C)  TempSrc: Oral  SpO2: 99%  Weight: 183 lb 6.4 oz (83.2 kg)  Height: 5' (1.524 m)    General: Well developed, well nourished, in no acute distress  Skin : Warm and dry.  Head: Normocephalic and atraumatic  Eyes: Sclera and conjunctiva clear; pupils round and reactive to light;  extraocular movements intact  Ears: External normal; canals clear; tympanic membranes normal  Oropharynx: Pink, supple. No suspicious lesions  Neck: Supple without thyromegaly, adenopathy  Lungs: Respirations unlabored; clear to auscultation bilaterally without wheeze, rales, rhonchi  Abdomen: Soft; nontender; nondistended; normoactive bowel sounds; no masses or hepatosplenomegaly  Musculoskeletal: No deformities; no active joint inflammation  Extremities: No edema, cyanosis, clubbing  Vessels: Symmetric bilaterally  Neurologic: Alert and oriented; speech intact; face symmetrical; moves all extremities well; CNII-XII intact without focal deficit   Assessment:  1. Intertrigo   2. Encounter for colonoscopy due to history of colon cancer   3. Anemia, unspecified type   4. Type 2 diabetes with complication (HCC)     Plan:  1. Trial of Diflucan x 10 days; Rx for Mycolog II apply bid to affected area;  2. Patient defers repeating CT at this time; will get colonoscopy done first and then determine if she needs to see surgeon again; she will follow-up once colonoscopy is done; 3. Check CBC, ferritin, TIBC/ ibc today especially in light of increased itching/ abdominal pain; 4. Stable; refill updated;   Return in about 2 months (around 07/16/2020) for with Dr. Sharlet Salina.  Orders Placed This Encounter  Procedures  . CBC with Differential/Platelet    Standing Status:   Future    Number of Occurrences:   1    Standing Expiration Date:   05/16/2021  . Ferritin    Standing Status:   Future    Number of Occurrences:   1    Standing Expiration Date:   05/16/2021  . Iron and TIBC    Standing Status:   Future    Number of Occurrences:   1    Standing Expiration Date:   05/16/2021  . Ambulatory referral to Gastroenterology    Referral Priority:   Routine    Referral Type:   Consultation    Referral Reason:   Specialty Services Required    Referred to Provider:   Yetta Flock, MD    Number  of Visits Requested:   1    Requested Prescriptions   Signed Prescriptions Disp Refills  . fluconazole (DIFLUCAN) 100 MG tablet 10 tablet 0    Sig: Take 1 tablet (100 mg total) by mouth daily.  Marland Kitchen nystatin-triamcinolone (MYCOLOG II) cream 60 g 1    Sig: Apply 1 application topically 2 (two) times daily.  . Dulaglutide (TRULICITY) 1.5 RC/7.8LF SOPN 6 mL 2    Sig: INJECT WEEKLY AS DIRECTED

## 2020-05-16 NOTE — Patient Instructions (Signed)
Intertrigo Intertrigo is skin irritation or inflammation (dermatitis) that occurs when folds of skin rub together. The irritation can cause a rash and make skin raw and itchy. This condition most commonly occurs in the skin folds of these areas:  Toes.  Armpits.  Groin.  Under the belly.  Under the breasts.  Buttocks. Intertrigo is not passed from person to person (is not contagious). What are the causes? This condition is caused by heat, moisture, rubbing (friction), and not enough air circulation. The condition can be made worse by:  Sweat.  Bacteria.  A fungus, such as yeast. What increases the risk? This condition is more likely to occur if you have moisture in your skin folds. You are more likely to develop this condition if you:  Have diabetes.  Are overweight.  Are not able to move around or are not active.  Live in a warm and moist climate.  Wear splints, braces, or other medical devices.  Are not able to control your bowels or bladder (have incontinence). What are the signs or symptoms? Symptoms of this condition include:  A pink or red skin rash in the skin fold or near the skin fold.  Raw or scaly skin.  Itchiness.  A burning feeling.  Bleeding.  Leaking fluid.  A bad smell. How is this diagnosed? This condition is diagnosed with a medical history and physical exam. You may also have a skin swab to test for bacteria or a fungus. How is this treated? This condition may be treated by:  Cleaning and drying your skin.  Taking an antibiotic medicine or using an antibiotic skin cream for a bacterial infection.  Using an antifungal cream on your skin or taking pills for an infection that was caused by a fungus, such as yeast.  Using a steroid ointment to relieve itchiness and irritation.  Separating the skin fold with a clean cotton cloth to absorb moisture and allow air to flow into the area. Follow these instructions at home:  Keep the  affected area clean and dry.  Do not scratch your skin.  Stay in a cool environment as much as possible. Use an air conditioner or fan, if available.  Apply over-the-counter and prescription medicines only as told by your health care provider.  If you were prescribed an antibiotic medicine, use it as told by your health care provider. Do not stop using the antibiotic even if your condition improves.  Keep all follow-up visits as told by your health care provider. This is important. How is this prevented?  Maintain a healthy weight.  Take care of your feet, especially if you have diabetes. Foot care includes: ? Wearing shoes that fit well. ? Keeping your feet dry. ? Wearing clean, breathable socks.  Protect the skin around your groin and buttocks, especially if you have incontinence. Skin protection includes: ? Following a regular cleaning routine. ? Using skin protectant creams, powders, or ointments. ? Changing protection pads frequently.  Do not wear tight clothes. Wear clothes that are loose, absorbent, and made of cotton.  Wear a bra that gives good support, if needed.  Shower and dry yourself well after activity or exercise. Use a hair dryer on a cool setting to dry between skin folds, especially after you bathe.  If you have diabetes, keep your blood sugar under control.   Contact a health care provider if:  Your symptoms do not improve with treatment.  Your symptoms get worse or they spread.  You notice increased redness  and warmth.  You have a fever. Summary  Intertrigo is skin irritation or inflammation (dermatitis) that occurs when folds of skin rub together.  This condition is caused by heat, moisture, rubbing (friction), and not enough air circulation.  This condition may be treated by cleaning and drying your skin and with medicines.  Apply over-the-counter and prescription medicines only as told by your health care provider.  Keep all follow-up visits  as told by your health care provider. This is important. This information is not intended to replace advice given to you by your health care provider. Make sure you discuss any questions you have with your health care provider. Document Revised: 07/22/2017 Document Reviewed: 07/22/2017 Elsevier Patient Education  2021 [employer].

## 2020-05-17 ENCOUNTER — Telehealth: Payer: Self-pay | Admitting: Family

## 2020-05-17 LAB — CBC WITH DIFFERENTIAL/PLATELET
Basophils Absolute: 0.1 10*3/uL (ref 0.0–0.1)
Basophils Relative: 1.2 % (ref 0.0–3.0)
Eosinophils Absolute: 0.2 10*3/uL (ref 0.0–0.7)
Eosinophils Relative: 3.8 % (ref 0.0–5.0)
HCT: 37.8 % (ref 36.0–46.0)
Hemoglobin: 11.8 g/dL — ABNORMAL LOW (ref 12.0–15.0)
Lymphocytes Relative: 47.9 % — ABNORMAL HIGH (ref 12.0–46.0)
Lymphs Abs: 2.1 10*3/uL (ref 0.7–4.0)
MCHC: 31.1 g/dL (ref 30.0–36.0)
MCV: 70.8 fl — ABNORMAL LOW (ref 78.0–100.0)
Monocytes Absolute: 0.5 10*3/uL (ref 0.1–1.0)
Monocytes Relative: 11.5 % (ref 3.0–12.0)
Neutro Abs: 1.5 10*3/uL (ref 1.4–7.7)
Neutrophils Relative %: 35.6 % — ABNORMAL LOW (ref 43.0–77.0)
Platelets: 244 10*3/uL (ref 150.0–400.0)
RBC: 5.34 Mil/uL — ABNORMAL HIGH (ref 3.87–5.11)
RDW: 15 % (ref 11.5–15.5)
WBC: 4.3 10*3/uL (ref 4.0–10.5)

## 2020-05-17 LAB — FERRITIN: Ferritin: 48.7 ng/mL (ref 10.0–291.0)

## 2020-05-17 LAB — IRON AND TIBC
Iron Saturation: 26 % (ref 15–55)
Iron: 75 ug/dL (ref 27–139)
Total Iron Binding Capacity: 292 ug/dL (ref 250–450)
UIBC: 217 ug/dL (ref 118–369)

## 2020-05-17 MED ORDER — TRIAMCINOLONE ACETONIDE 0.1 % EX CREA: 1.0000 "application " | TOPICAL_CREAM | Freq: Two times a day (BID) | CUTANEOUS | 0 refills | Status: DC

## 2020-05-17 MED ORDER — NYSTATIN 100000 UNIT/GM EX CREA: 1.0000 "application " | TOPICAL_CREAM | Freq: Two times a day (BID) | CUTANEOUS | 0 refills | Status: DC

## 2020-05-17 NOTE — Telephone Encounter (Signed)
I have called pt and informed her about the creams and she stated understanding. She will also pick up her other medications.   She is feeling a bit better already and thinks her hernia is coming back, but no additional concerns at this time.

## 2020-05-17 NOTE — Telephone Encounter (Signed)
I have called the pharmacy and gathered some more information. The pharmacy says that the combination is not covered by her insurance and most insurances, however if we sperate them then the chances of the RX to get covered is higher. I have pended both mediations. Please sign off if this is still okay with you.   If this is approved then I will call pt to notify her.

## 2020-05-17 NOTE — Telephone Encounter (Signed)
  Patient calling to report nystatin-triamcinolone (MYCOLOG II) cream is too costly ($200+) What is the alternative   Please advise

## 2020-07-01 ENCOUNTER — Other Ambulatory Visit: Payer: Self-pay | Admitting: Family

## 2020-07-29 ENCOUNTER — Other Ambulatory Visit: Payer: Self-pay

## 2020-07-29 ENCOUNTER — Encounter: Payer: Self-pay | Admitting: Family

## 2020-07-29 ENCOUNTER — Ambulatory Visit (INDEPENDENT_AMBULATORY_CARE_PROVIDER_SITE_OTHER): Payer: No Typology Code available for payment source | Admitting: Family

## 2020-07-29 VITALS — BP 122/78 | HR 81 | Temp 98.5°F | Ht 60.0 in | Wt 186.6 lb

## 2020-07-29 DIAGNOSIS — K5900 Constipation, unspecified: Secondary | ICD-10-CM | POA: Diagnosis not present

## 2020-07-29 DIAGNOSIS — R194 Change in bowel habit: Secondary | ICD-10-CM | POA: Diagnosis not present

## 2020-07-29 DIAGNOSIS — C189 Malignant neoplasm of colon, unspecified: Secondary | ICD-10-CM | POA: Diagnosis not present

## 2020-07-29 NOTE — Progress Notes (Signed)
[name] [name] is a 60 y.o. female with the following history as recorded in EpicCare:  Patient Active Problem List   Diagnosis Date Noted  . Left shoulder pain 07/10/2018  . Abdominal pain 07/03/2018  . Diarrhea 07/03/2018  . Nausea with vomiting 07/03/2018  . Urinary frequency 06/15/2016  . Type 2 diabetes with complication ([place]) 01/75/1025  . Hyperlipidemia associated with type 2 diabetes mellitus ([place]) 12/30/2012  . Obesity (BMI 30-39.9) 12/23/2012  . Colon cancer ([place]) 10/31/2011    Current Outpatient Medications  Medication Sig Dispense Refill  . atorvastatin (LIPITOR) 20 MG tablet TAKE 1 TABLET BY MOUTH EVERY DAY 90 tablet 0  . Biotin 1 MG CAPS Take by mouth.    . Dulaglutide (TRULICITY) 1.5 EN/2.7PO SOPN INJECT WEEKLY AS DIRECTED 6 mL 2  . glucose blood test strip Use as instructed 100 each 12  . ibuprofen (ADVIL,MOTRIN) 200 MG tablet Take 400 mg by mouth every 6 (six) hours as needed for mild pain or moderate pain. Reported on 07/12/2015    . Lancets (ONETOUCH ULTRASOFT) lancets Use as instructed 100 each 12  . Multiple Vitamin (MULTIVITAMIN WITH MINERALS) TABS tablet Take 1 tablet by mouth daily.    Marland Kitchen nystatin cream (MYCOSTATIN) Apply 1 application topically 2 (two) times daily. 30 g 0  . nystatin-triamcinolone (MYCOLOG II) cream Apply 1 application topically 2 (two) times daily. 60 g 1  . triamcinolone (KENALOG) 0.1 % Apply 1 application topically 2 (two) times daily. 30 g 0  . fluconazole (DIFLUCAN) 100 MG tablet Take 1 tablet (100 mg total) by mouth daily. (Patient not taking: Reported on 07/29/2020) 10 tablet 0   No current facility-administered medications for this visit.    Allergies: Epinephrine, Lidocaine, Lisinopril, and Metformin and related  Past Medical History:  Diagnosis Date  . Abdominal hernia    4 hernias per pt  . Abdominal pain    mid abd pain radiating to right side  . Anemia   . Cancer ([place])    rectal ca  . Change in bowel movement   . Colon  cancer ([place])   . Complication of anesthesia    cardiac arrest during nasal surgery  . Diabetes mellitus without complication ([place])   . Headache   . History of rectal bleeding     Past Surgical History:  Procedure Laterality Date  . CESAREAN SECTION    . colon cancer    . COLON SURGERY    . COLONOSCOPY    . LAPAROSCOPIC LYSIS OF ADHESIONS  04/02/2014   Procedure: LAPAROSCOPIC LYSIS OF ADHESIONS;  Surgeon: Jackolyn Confer, MD;  Location: [facility];  Service: General;;  . NOSE SURGERY    . POLYPECTOMY    . VENTRAL HERNIA REPAIR N/A 04/02/2014   Procedure: LAPAROSCOPIC  LYSIS OF ADHESIONS AND REPAIR OF VENTRAL INCISIONAL HERNIAS WITH MESH;  Surgeon: Jackolyn Confer, MD;  Location: [facility];  Service: General;  Laterality: N/A;    Family History  Problem Relation Age of Onset  . Colon cancer Maternal Grandmother   . Colon cancer Maternal Aunt   . Uterine cancer Mother   . Breast cancer Paternal Aunt   . Breast cancer Cousin   . Breast cancer Cousin     Social History   Tobacco Use  . Smoking status: Former Smoker    Packs/day: 0.25    Years: 13.00    Pack years: 3.25    Types: Cigarettes    Quit date: 03/14/2003    Years since quitting: 17.3  .  Smokeless tobacco: Never Used  Substance Use Topics  . Alcohol use: Yes    Alcohol/week: 0.0 standard drinks    Comment: occasional     Subjective:  Known history of colon cancer; notes that has had persisting/ chronic problems with her bowels since surgery; notes she has mesh in abdomen as well and has repeatedly discussed concerns with surgery but was told that everything was normal; Referral to GI was done in March to discuss 5 year colonoscopy but she has not been contacted to schedule; notes however in the past 2 months, her symptoms have progressively worsened and she feels like there is more abdominal pain/ increased difficulty with her bowel movements; notes it can take her up to an hour to have a bowel movement and even then has  sensation of incomplete emptying; pattern can then alternate with episodes of "explosions."  Feels that stress at her job is adding more complications to her physical and mental health; she is considering to apply for FMLA or short term disability to have time for needed appointments to determine the source of her GI issues and complete follow up as needed.   Objective:  Vitals:   07/29/20 1534  BP: 122/78  Pulse: 81  Temp: 98.5 F (36.9 C)  TempSrc: Oral  SpO2: 99%  Weight: 186 lb 9.6 oz (84.6 kg)  Height: 5' (1.524 m)    General: Well developed, well nourished, in no acute distress  Skin : Warm and dry.  Head: Normocephalic and atraumatic  Eyes: Sclera and conjunctiva clear; pupils round and reactive to light; extraocular movements intact  Ears: External normal; canals clear; tympanic membranes normal  Oropharynx: Pink, supple. No suspicious lesions  Neck: Supple without thyromegaly, adenopathy  Lungs: Respirations unlabored; clear to auscultation bilaterally without wheeze, rales, rhonchi  Abdomen: Soft; nontender; nondistended;  Neurologic: Alert and oriented; speech intact; face symmetrical; moves all extremities well; CNII-XII intact without focal deficit   Assessment:  1. Malignant neoplasm of colon, unspecified part of colon ([place])   2. Bowel habit changes   3. Difficult bowel movements     Plan:  Due to personal history of colon cancer and concerning changes, will get urgent referral to GI set up; she did not want to do CT in March and do feel she needs this now; with shortage of CT supplies, will let GI decide the specific tests they prefer and plan for needed follow up; she understands she will then need to see surgeon for further follow up as previously discussed to evaluate her hernia concerns; She will talk to her HR about options for work and will forward the necessary paperwork to protect her job;  Time spent 30 minutes discussing treatment plan/ management  options  This visit occurred during the SARS-CoV-2 public health emergency.  Safety protocols were in place, including screening questions prior to the visit, additional usage of staff PPE, and extensive cleaning of exam room while observing appropriate contact time as indicated for disinfecting solutions.      No follow-ups on file.  Orders Placed This Encounter  Procedures  . Ambulatory referral to Gastroenterology    Referral Priority:   Urgent    Referral Type:   Consultation    Referral Reason:   Specialty Services Required    Number of Visits Requested:   1    Requested Prescriptions    No prescriptions requested or ordered in this encounter

## 2020-08-02 ENCOUNTER — Telehealth: Payer: Self-pay | Admitting: Family

## 2020-08-02 NOTE — Telephone Encounter (Signed)
Patient would like to know if you received a fax from [employer] (fmla).

## 2020-08-03 ENCOUNTER — Encounter: Payer: Self-pay | Admitting: Gastroenterology

## 2020-08-03 NOTE — Telephone Encounter (Signed)
Pt called to follow up on FMLA doccuments to see if we had received them from her employer.  I advised her we had not per Mei's last telephone encounter.  Pt stated her HR department had sent it to a fax number that was listed for [place] in their office, but she could not give me that number.  I advised pt to check with HR and make sure documents were sent to [phone].  Pt said she would follow up with her HR department.  I also advised pt Guadelupe Sabin had tried to reach her earlier and only got a busy signal when calling her.  I verified pt's contact number to be correct as listed in the chart [phone].

## 2020-08-03 NOTE — Telephone Encounter (Signed)
I have attempted to reach pt with no success. Her phone went to the busy signal.   We have not seen her FMLA forms as of yet.

## 2020-08-03 NOTE — Telephone Encounter (Signed)
The pt has called back to the office and she was very worried about her paperwork since she will start her continuous leave on Monday 08/08/20. She stated that she will need her FMLA forms and STD forms completed until she gets  down to the bottom of what is going on with her. She has a GI consult scheduled for 09/01/20 and wanted to know what the next step is. She also wanted [place] to expedite the NPV for GI and I have informed her that the availability of another office is out of our hands. She expressed understanding and wanted to talk to Mickel Baas. I have informed her that Mickel Baas will be out of the office tomorrow and will be back on Friday, however when she does respond to the message I will call her and relay it back to her.   I have not received any forms from [facility] as of yet and I am still looking out for the paperwork. I have informed her that once I receive the forms I will call her to notify her. She stated understanding.

## 2020-08-03 NOTE — Telephone Encounter (Signed)
Noted and I have also received a verbal report and will wait for the [facility] paperwork.

## 2020-08-03 NOTE — Telephone Encounter (Signed)
Paperwork faxed in Placed into laura bin up front

## 2020-08-04 NOTE — Telephone Encounter (Signed)
I have called the pt back to inform her that I have received the paperwork and I will start on it as soon as I can. I did want to inform her that she needed to come to the office and complete the EMPLOYEE section on the form. Pt stated understanding and will come to the office on Monday since that is when her leave starts.   This form looks like a combination of FMLA and disability. Pt stated that this is an ongoing issue and her consult with GI is at the end of the month (09/01/20) and was wondering if that means that she has to be written out of work constantly for longer. I have informed her that Mickel Baas will look into the form and go from there. She is in agreement to the plan.

## 2020-08-05 NOTE — Telephone Encounter (Signed)
[name] [name] has completed her part of the paperwork and I have called the pt to notify her of this. Pt is to complete her part and give it back to [place] so we can fax it to [place] at [phone]. I have started a cover letter and placed the form up front in the pt pick up area. Pt is to come by the office on Monday to do this.   Please fax and put in my red [facility] Done folder with the confirmation & a copy to scan if possible.   Thanks Team.

## 2020-08-08 NOTE — Telephone Encounter (Signed)
Form has been faxed, copy sent to scan, and copy placed at assistant desk.

## 2020-08-12 ENCOUNTER — Encounter: Payer: Self-pay | Admitting: Family

## 2020-08-12 NOTE — Telephone Encounter (Signed)
Spoke to patient; corrected FMLA will be re-sent;

## 2020-09-01 ENCOUNTER — Other Ambulatory Visit (INDEPENDENT_AMBULATORY_CARE_PROVIDER_SITE_OTHER): Payer: No Typology Code available for payment source

## 2020-09-01 ENCOUNTER — Encounter: Payer: Self-pay | Admitting: Physician Assistant

## 2020-09-01 ENCOUNTER — Ambulatory Visit: Payer: No Typology Code available for payment source | Admitting: Physician Assistant

## 2020-09-01 VITALS — BP 140/80 | HR 76 | Ht 60.0 in | Wt 188.0 lb

## 2020-09-01 DIAGNOSIS — K5900 Constipation, unspecified: Secondary | ICD-10-CM | POA: Diagnosis not present

## 2020-09-01 DIAGNOSIS — R1084 Generalized abdominal pain: Secondary | ICD-10-CM

## 2020-09-01 DIAGNOSIS — Z86010 Personal history of colonic polyps: Secondary | ICD-10-CM

## 2020-09-01 DIAGNOSIS — Z8719 Personal history of other diseases of the digestive system: Secondary | ICD-10-CM | POA: Diagnosis not present

## 2020-09-01 DIAGNOSIS — Z85048 Personal history of other malignant neoplasm of rectum, rectosigmoid junction, and anus: Secondary | ICD-10-CM

## 2020-09-01 LAB — CBC WITH DIFFERENTIAL/PLATELET
Basophils Absolute: 0 10*3/uL (ref 0.0–0.1)
Basophils Relative: 0.8 % (ref 0.0–3.0)
Eosinophils Absolute: 0.1 10*3/uL (ref 0.0–0.7)
Eosinophils Relative: 1.6 % (ref 0.0–5.0)
HCT: 38.2 % (ref 36.0–46.0)
Hemoglobin: 11.9 g/dL — ABNORMAL LOW (ref 12.0–15.0)
Lymphocytes Relative: 48.9 % — ABNORMAL HIGH (ref 12.0–46.0)
Lymphs Abs: 2 10*3/uL (ref 0.7–4.0)
MCHC: 31.2 g/dL (ref 30.0–36.0)
MCV: 70.8 fl — ABNORMAL LOW (ref 78.0–100.0)
Monocytes Absolute: 0.3 10*3/uL (ref 0.1–1.0)
Monocytes Relative: 8.5 % (ref 3.0–12.0)
Neutro Abs: 1.6 10*3/uL (ref 1.4–7.7)
Neutrophils Relative %: 40.2 % — ABNORMAL LOW (ref 43.0–77.0)
Platelets: 226 10*3/uL (ref 150.0–400.0)
RBC: 5.4 Mil/uL — ABNORMAL HIGH (ref 3.87–5.11)
RDW: 15 % (ref 11.5–15.5)
WBC: 4 10*3/uL (ref 4.0–10.5)

## 2020-09-01 LAB — BASIC METABOLIC PANEL
BUN: 7 mg/dL (ref 6–23)
CO2: 26 mEq/L (ref 19–32)
Calcium: 9.1 mg/dL (ref 8.4–10.5)
Chloride: 107 mEq/L (ref 96–112)
Creatinine, Ser: 0.84 mg/dL (ref 0.40–1.20)
GFR: 74.97 mL/min (ref >60.00)
Glucose, Bld: 128 mg/dL — ABNORMAL HIGH (ref 70–99)
Potassium: 4.2 mEq/L (ref 3.5–5.1)
Sodium: 142 mEq/L (ref 135–145)

## 2020-09-01 MED ORDER — SUTAB 1479-225-188 MG PO TABS: 1.0000 | ORAL_TABLET | ORAL | 0 refills | Status: DC

## 2020-09-01 NOTE — Progress Notes (Signed)
Subjective:    Patient ID: [name] [name], female    DOB: 1960-11-20, 60 y.o.   MRN: [id number]  HPI [name] [name] is a pleasant 60 year old African-American female established with Dr. Havery Moros who comes in today with complaints of abdominal pain which she says has been present over the past year, and somewhat progressive recently. 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 Her last colonoscopy was done in 2017 with an end-to-end anastomosis of the rectum, 2 small polyps were removed both 4 to 5 mm in size and noted a few left colon diverticuli.  Path on the polyps consistent with a tubular adenoma and a hyperplastic polyp and she is indicated for 5-year interval follow-up. She says that she has been having some low back pain off and on for years and feels that she has a hard time straining for bowel movements and still has a tendency to pass narrower caliber stools.  She has had multiple ventral hernias repaired and says she has a large mesh across the abdominal wall presently.  Surgery was done in 2016 and also had lysis of adhesions at that time.  She has been uncomfortable usually particularly later in the day in the evenings and feels best if she is stretched out.  She has had somewhat migratory abdominal pain sometimes feeling in the right upper quadrant sometimes left upper quadrant sometimes left lower quadrant.  She is wondering whether she has developed recurrent hernias .She has had some episodes of fairly severe pain, which occur about once a month can be associated with diaphoresis and then multiple episodes of stool which she said completely purges her bowel.  In between these times she tends to be more on the constipated side.  Review of Systems Pertinent positive and negative review of systems  were noted in the above HPI section.  All other review of systems was otherwise negative.  Outpatient Encounter Medications as of 09/01/2020  Medication Sig   atorvastatin (LIPITOR) 20 MG tablet TAKE 1 TABLET BY MOUTH EVERY DAY   Biotin 1 MG CAPS Take by mouth.   Dulaglutide (TRULICITY) 1.5 GQ/6.7YP SOPN INJECT WEEKLY AS DIRECTED   glucose blood test strip Use as instructed   ibuprofen (ADVIL,MOTRIN) 200 MG tablet Take 400 mg by mouth every 6 (six) hours as needed for mild pain or moderate pain. Reported on 07/12/2015   Lancets (ONETOUCH ULTRASOFT) lancets Use as instructed   Multiple Vitamin (MULTIVITAMIN WITH MINERALS) TABS tablet Take 1 tablet by mouth daily.   nystatin cream (MYCOSTATIN) Apply 1 application topically 2 (two) times daily.   nystatin-triamcinolone (MYCOLOG II) cream Apply 1 application topically 2 (two) times daily.   Sodium Sulfate-Mag Sulfate-KCl (SUTAB) [phone] MG TABS Take 1 kit by mouth as directed.   triamcinolone (KENALOG) 0.1 % Apply 1 application topically 2 (two) times daily.   [DISCONTINUED] fluconazole (DIFLUCAN) 100 MG tablet Take 1 tablet (100 mg total) by mouth daily. (Patient not taking: Reported on 07/29/2020)   No facility-administered encounter medications on file as of 09/01/2020.   Allergies  Allergen Reactions   Epinephrine Anaphylaxis   Lidocaine Anaphylaxis   Lisinopril     itching   Metformin And Related     GI upset   Patient Active Problem List   Diagnosis Date Noted   Left shoulder pain 07/10/2018   Abdominal pain 07/03/2018  Diarrhea 07/03/2018   Nausea with vomiting 07/03/2018   Urinary frequency 06/15/2016   Type 2 diabetes with complication ([place]) 12/27/8525   Hyperlipidemia associated with type 2 diabetes mellitus ([place]) 12/30/2012   Obesity (BMI 30-39.9) 12/23/2012   Colon cancer ([place]) 10/31/2011   Social History   Socioeconomic History   Marital status: Married    Spouse name: Not on file   Number of children: 3   Years  of education: 12   Highest education level: Not on file  Occupational History   Occupation: Customer Service  Tobacco Use   Smoking status: Former    Packs/day: 0.25    Years: 13.00    Pack years: 3.25    Types: Cigarettes    Quit date: 03/14/2003    Years since quitting: 17.4   Smokeless tobacco: Never  Vaping Use   Vaping Use: Some days   Substances: Flavoring   Devices: flavored , non-nicotine  Substance and Sexual Activity   Alcohol use: Yes    Alcohol/week: 0.0 standard drinks    Comment: occasional    Drug use: No   Sexual activity: Not Currently  Other Topics Concern   Not on file  Social History Narrative   Fun: Camping, Cruise    Denies abuse and feels safe at home.    Social Determinants of Health   Financial Resource Strain: Not on file  Food Insecurity: Not on file  Transportation Needs: Not on file  Physical Activity: Not on file  Stress: Not on file  Social Connections: Not on file  Intimate Partner Violence: Not on file    [name] [name]'s family history includes Breast cancer in her cousin, cousin, and paternal aunt; Colon cancer in her maternal aunt and maternal grandmother; Uterine cancer in her mother.      Objective:    Vitals:   09/01/20 0938  BP: 140/80  Pulse: 76    Physical Exam Well-developed well-nourished older African-American female in no acute distress.   Weight, 188 BMI 36.7  HEENT; nontraumatic normocephalic, EOMI, PE R LA, sclera anicteric. Oropharynx; not examined today Neck; supple, no JVD Cardiovascular; regular rate and rhythm with S1-S2, no murmur rub or gallop Pulmonary; Clear bilaterally Abdomen; soft, mild rather generalized tenderness no palpable mass or hepatosplenomegaly, low transverse incisional scar, bowel sounds are active, no definite palpable abdominal wall hernias Rectal; not done today Skin; benign exam, no jaundice rash or appreciable lesions Extremities; no clubbing cyanosis or edema skin warm and  dry Neuro/Psych; alert and oriented x4, grossly nonfocal mood and affect appropriate        Assessment & Plan:   #1 60 African-American female with history of rectal adenocarcinoma diagnosed 2007, underwent low anterior resection, 1 of 19 nodes positive and completed radiation and chemotherapy. #2 history of adenomatous and hyperplastic colon polyps-last colonoscopy 2017 #3 history of multiple ventral hernias status post hernia repairs and placement of mesh as well as lysis of adhesions 2016 1 year history of somewhat migratory abdominal pain, somewhat positional feeling better with her abdomen stretched out or lying down.  Query whether pain may be related to mesh and/or recurrence of a ventral hernia (S).  Rule out pain secondary to adhesions, rule out neoplasm, rule out other intra-abdominal inflammatory process  #4 constipation #5 adult onset diabetes mellitus  Plan CBC with differential, be met Patient will be scheduled for CT scan of the abdomen and pelvis with IV contrast. Start Benefiber 2 tablespoons daily in a glass of water and add  one half dose of MiraLAX daily.  (Patient relates when she had taken full dose MiraLAX in the past this causes her to go to frequently) Water intake of least 60 ounces per day Patient will be scheduled for follow-up colonoscopy with Dr. Havery Moros.  Procedure discussed in detail with patient including indications risks and benefits and she is agreeable to proceed. Further recommendations pending results of above.    Cowan Pilar S Dametrius Sanjuan PA-C 09/01/2020   Cc: [name] [name],*

## 2020-09-01 NOTE — Patient Instructions (Signed)
If you are age 61 or younger, your body mass index should be between 19-25. Your Body mass index is 36.72 kg/m. If this is out of the aformentioned range listed, please consider follow up with your Primary Care Provider.  __________________________________________________________  The [facility] providers would like to encourage you to use [facility] to communicate with providers for non-urgent requests or questions.  Due to long hold times on the telephone, sending your provider a message by [facility] may be a faster and more efficient way to get a response.  Please allow 48 business hours for a response.  Please remember that this is for non-urgent requests.   You have been scheduled for a colonoscopy. Please follow written instructions given to you at your visit today.  Please pick up your prep supplies at the pharmacy within the next 1-3 days. If you use inhalers (even only as needed), please bring them with you on the day of your procedure.  You have been scheduled for a CT scan of the abdomen and pelvis at [facility] ([address]---this is in the same building as [employer]).   You are scheduled on 09/08/2020 at 11:30 am. You should arrive 15 minutes prior to your appointment time for registration. Please follow the written instructions below on the day of your exam:  WARNING: IF YOU ARE ALLERGIC TO IODINE/X-RAY DYE, PLEASE NOTIFY RADIOLOGY IMMEDIATELY AT [phone]! YOU WILL BE GIVEN A 13 HOUR PREMEDICATION PREP.  1) Do not eat anything after 7:30 am (4 hours prior to your test). Please drink plenty of water prior. 2) You have been given 2 bottles of oral contrast to drink. The solution may taste better if refrigerated, but do NOT add ice or any other liquid to this solution. Shake well before drinking.    Drink 1 bottle of contrast @ 9:30 am (2 hours prior to your exam)  Drink 1 bottle of contrast @ 10:30 am (1 hour prior to your exam)  You may take any  medications as prescribed with a small amount of water, if necessary. If you take any of the following medications: METFORMIN, GLUCOPHAGE, GLUCOVANCE, AVANDAMET, RIOMET, FORTAMET, [place] MET, JANUMET, GLUMETZA or METAGLIP, you MAY be asked to HOLD this medication 48 hours AFTER the exam.  The purpose of you drinking the oral contrast is to aid in the visualization of your intestinal tract. The contrast solution may cause some diarrhea. Depending on your individual set of symptoms, you may also receive an intravenous injection of x-ray contrast/dye. Plan on being at [facility] for 30 minutes or longer, depending on the type of exam you are having performed.  This test typically takes 30-45 minutes to complete.  If you have any questions regarding your exam or if you need to reschedule, you may call the CT department at [phone] between the hours of 8:00 am and 5:00 pm, Monday-Friday.  ____________________________________________________________  Use Benefiber once daily as directed in water or juice.  Try Miralax half a capful in 6 ounces of water or juice daily.  Follow up pending the results of your CT and Colonoscopy.  Thank you for entrusting me with your care and choosing [facility].  Amy Esterwood, PA-C

## 2020-09-02 NOTE — Progress Notes (Signed)
Agree with assessment and plan as outlined.  

## 2020-09-08 ENCOUNTER — Ambulatory Visit (INDEPENDENT_AMBULATORY_CARE_PROVIDER_SITE_OTHER)
Admission: RE | Admit: 2020-09-08 | Discharge: 2020-09-08 | Disposition: A | Discharge status: Home / Self Care | Payer: No Typology Code available for payment source | Source: Ambulatory Visit | Attending: Physician Assistant | Admitting: Physician Assistant

## 2020-09-08 ENCOUNTER — Other Ambulatory Visit: Payer: Self-pay

## 2020-09-08 DIAGNOSIS — R1084 Generalized abdominal pain: Secondary | ICD-10-CM | POA: Diagnosis not present

## 2020-09-08 DIAGNOSIS — Z8719 Personal history of other diseases of the digestive system: Secondary | ICD-10-CM

## 2020-09-08 DIAGNOSIS — Z85048 Personal history of other malignant neoplasm of rectum, rectosigmoid junction, and anus: Secondary | ICD-10-CM | POA: Diagnosis not present

## 2020-09-08 DIAGNOSIS — K5900 Constipation, unspecified: Secondary | ICD-10-CM

## 2020-09-08 DIAGNOSIS — Z86010 Personal history of colonic polyps: Secondary | ICD-10-CM

## 2020-09-08 IMAGING — CT CT ABD-PELV W/ CM
2 of 5 series · 16 of 46 positions shown, 18 images · IV contrast (omnipaque)
Comparison: [DATE]

CLINICAL DATA: Chronic abdominal pain. History of colon cancer,
status post resection, chemotherapy, and radiation.

EXAM:
CT ABDOMEN AND PELVIS WITH CONTRAST
TECHNIQUE: Multidetector CT imaging of the abdomen and pelvis was performed
using the standard protocol following bolus administration of
intravenous contrast.
CONTRAST:  100mL OMNIPAQUE IOHEXOL 300 MG/ML  SOLN

[Series 2: abd/pel w · axial · 0.70mm/px · z∈[-442,-47]mm · 13 of 89 slices shown, 15 images]
[im 5/89  soft-tissue]
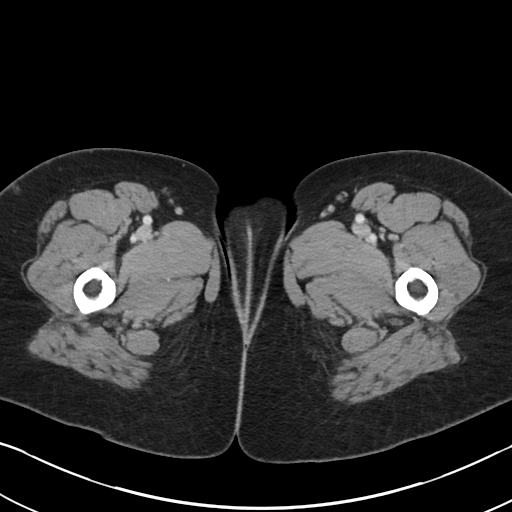
[im 5/89  bone]
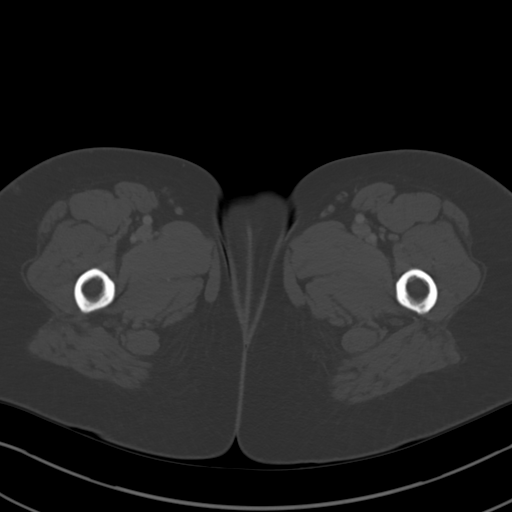
[im 14/89  soft-tissue]
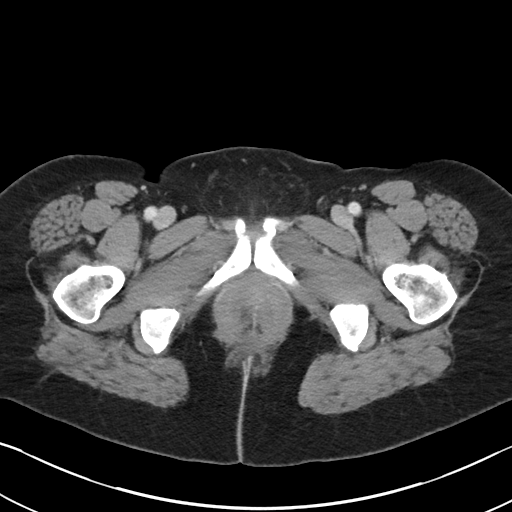
[im 19/89  soft-tissue]
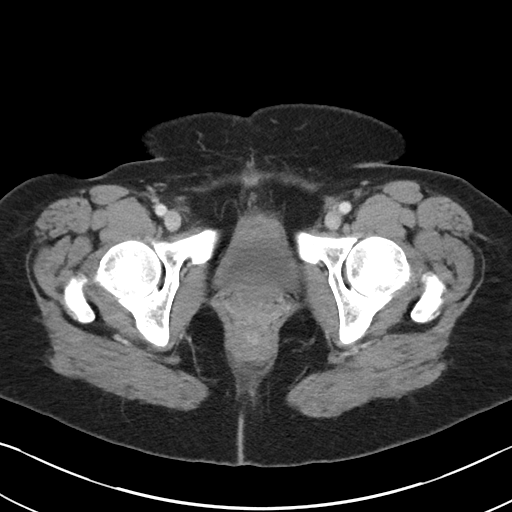
[im 24/89  soft-tissue]
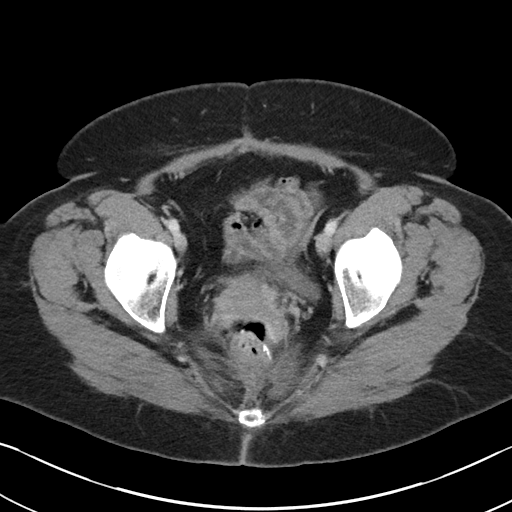
[im 33/89  soft-tissue]
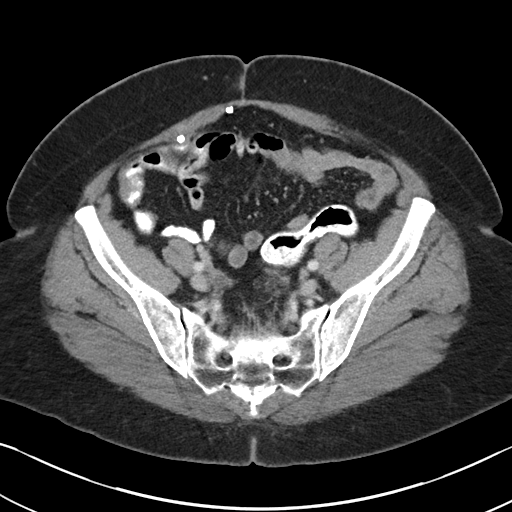
[im 38/89  soft-tissue]
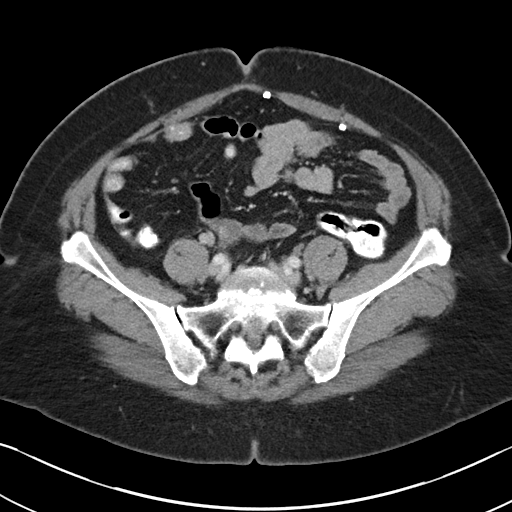
[im 47/89  soft-tissue]
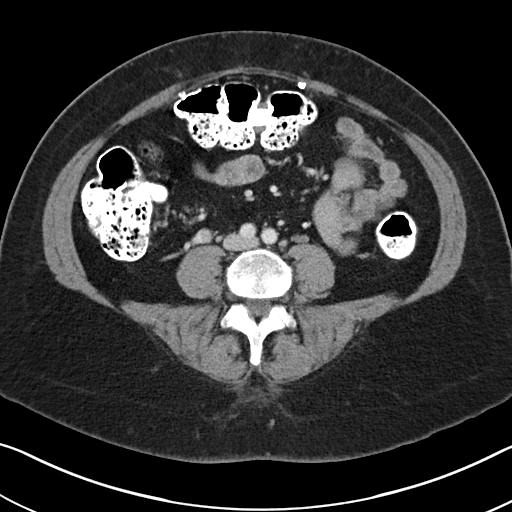
[im 51/89  soft-tissue]
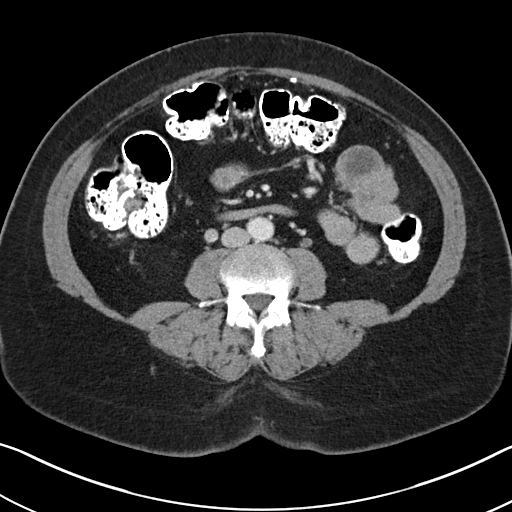
[im 56/89  soft-tissue]
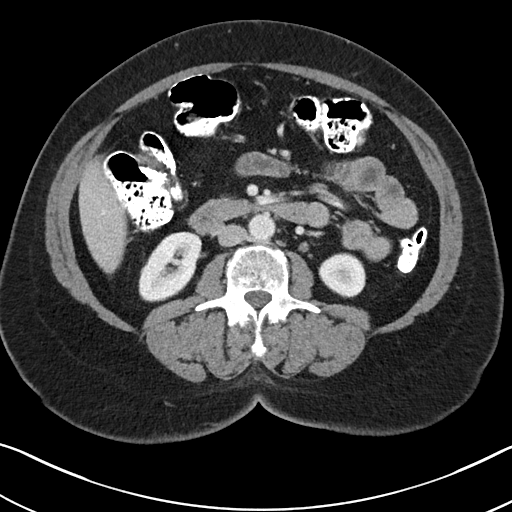
[im 56/89  bone]
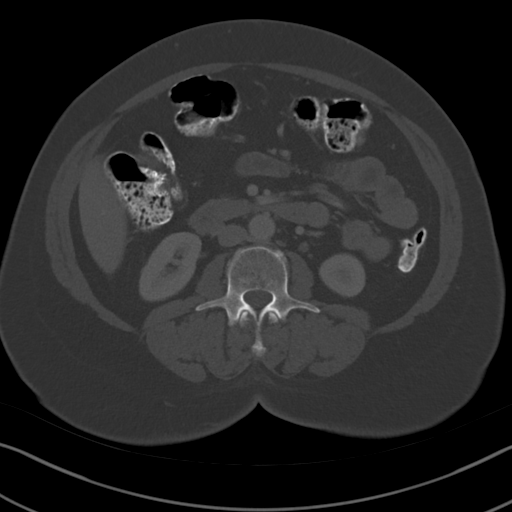
[im 65/89  soft-tissue]
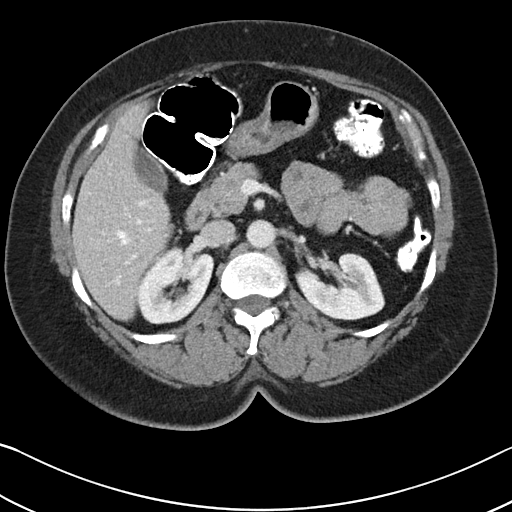
[im 70/89  soft-tissue]
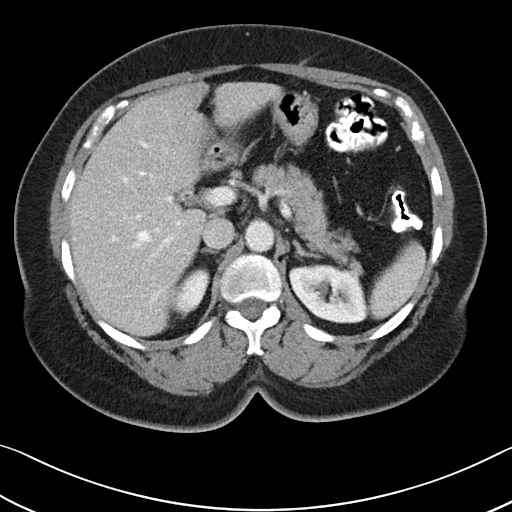
[im 75/89  soft-tissue]
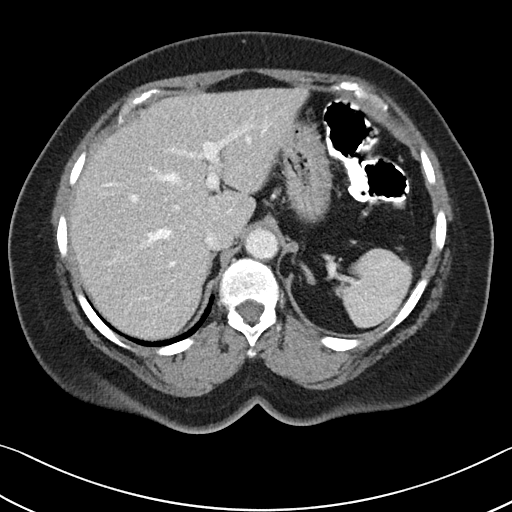
[im 84/89  soft-tissue]
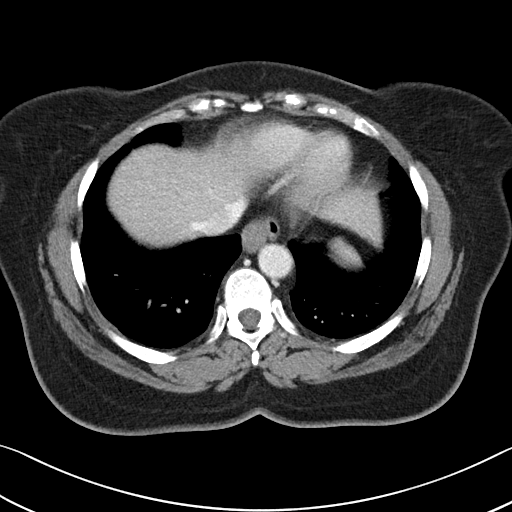

[Series 5: coronal st · coronal · 0.65mm/px · 3 of 86 slices shown]
[im 29/86  soft-tissue]
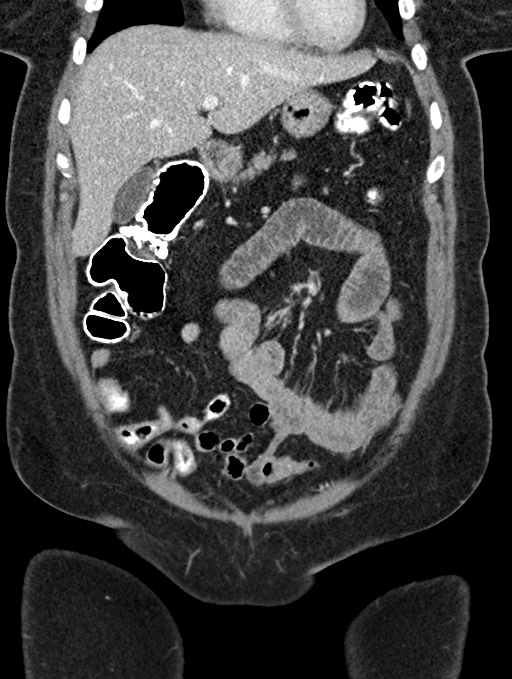
[im 38/86  soft-tissue]
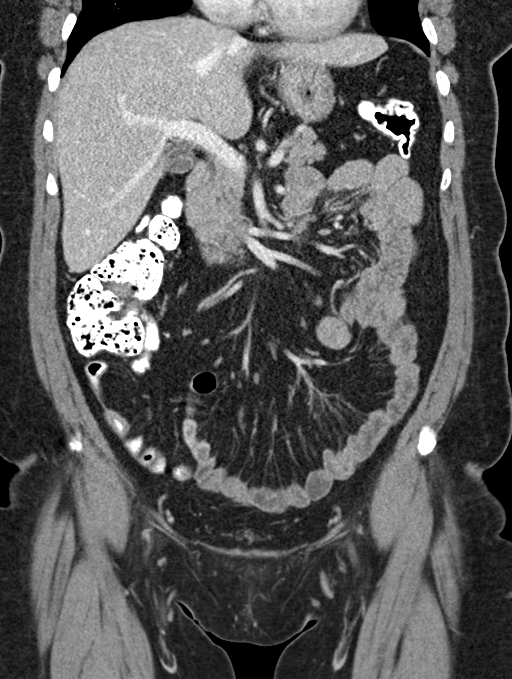
[im 48/86  soft-tissue]
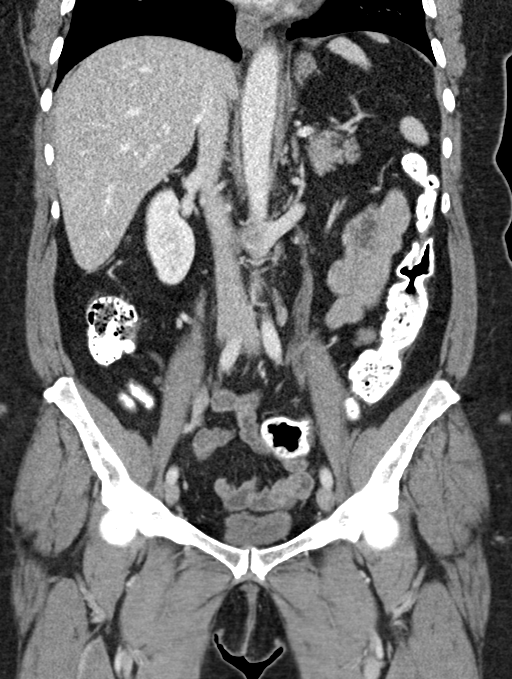

[16 of 46 positions shown; findings below may reference images not displayed]

FINDINGS: Lower chest: Lung bases are clear.

Hepatobiliary: Liver is within normal limits.

Gallbladder is unremarkable. No intrahepatic or extrahepatic ductal
dilatation.

Pancreas: Within normal limits.

Spleen: Within normal limits.

Adrenals/Urinary Tract: Adrenal glands are within normal limits.

Kidneys are within normal limits.  No hydronephrosis.

Bladder is underdistended but unremarkable.

Stomach/Bowel: Stomach is notable for a tiny hiatal hernia.

No evidence of bowel obstruction.

Normal appendix (series 2/image 50).

Status post low anterior resection with suture line in the lower
pelvis (series 2/image 63).

Stable presacral soft tissue/radiation changes.

Vascular/Lymphatic: No evidence of abdominal aortic aneurysm.

No suspicious abdominopelvic lymphadenopathy.

Reproductive: Uterus is within normal limits.

Bilateral ovaries are within normal limits.

Other: No abdominopelvic ascites.

Postsurgical changes related to prior ventral hernia mesh repair.

Musculoskeletal: Mild degenerative changes at L5-S1.
IMPRESSION: Status post low anterior resection. No evidence of recurrent or
metastatic disease.

No evidence of bowel obstruction.  Normal appendix.

Postop changes related to prior ventral hernia mesh repair.

Additional stable ancillary findings as above.

## 2020-09-08 MED ORDER — IOHEXOL 300 MG/ML  SOLN
100.0000 mL | Freq: Once | INTRAMUSCULAR | Status: AC | PRN
  Administered 2020-09-08: 100 mL via INTRAVENOUS

## 2020-09-09 ENCOUNTER — Encounter: Payer: Self-pay | Admitting: Family

## 2020-09-12 ENCOUNTER — Encounter: Payer: Self-pay | Admitting: Certified Registered Nurse Anesthetist

## 2020-09-12 ENCOUNTER — Telehealth: Payer: Self-pay | Admitting: Gastroenterology

## 2020-09-12 NOTE — Telephone Encounter (Signed)
Left a message with answering service last night asking for a call back regarding prep questions for procedure tomorrow.

## 2020-09-12 NOTE — Telephone Encounter (Signed)
10:23 attempted to call patient. Left voicemail.

## 2020-09-12 NOTE — Telephone Encounter (Signed)
Pt had questions about her prep but found her paperwork prior to me returning her call. Did clarify start time for her prep in the morning.

## 2020-09-13 ENCOUNTER — Other Ambulatory Visit: Payer: Self-pay

## 2020-09-13 ENCOUNTER — Telehealth: Payer: Self-pay | Admitting: Gastroenterology

## 2020-09-13 ENCOUNTER — Encounter: Payer: Self-pay | Admitting: Gastroenterology

## 2020-09-13 ENCOUNTER — Ambulatory Visit (AMBULATORY_SURGERY_CENTER): Payer: No Typology Code available for payment source | Admitting: Gastroenterology

## 2020-09-13 VITALS — BP 142/81 | HR 74 | Temp 98.0°F | Resp 16 | Ht 60.0 in | Wt 188.0 lb

## 2020-09-13 DIAGNOSIS — D123 Benign neoplasm of transverse colon: Secondary | ICD-10-CM | POA: Diagnosis not present

## 2020-09-13 DIAGNOSIS — K5730 Diverticulosis of large intestine without perforation or abscess without bleeding: Secondary | ICD-10-CM

## 2020-09-13 DIAGNOSIS — Z85048 Personal history of other malignant neoplasm of rectum, rectosigmoid junction, and anus: Secondary | ICD-10-CM | POA: Diagnosis present

## 2020-09-13 DIAGNOSIS — K5900 Constipation, unspecified: Secondary | ICD-10-CM | POA: Diagnosis not present

## 2020-09-13 DIAGNOSIS — D120 Benign neoplasm of cecum: Secondary | ICD-10-CM | POA: Diagnosis not present

## 2020-09-13 DIAGNOSIS — R1084 Generalized abdominal pain: Secondary | ICD-10-CM

## 2020-09-13 DIAGNOSIS — M549 Dorsalgia, unspecified: Secondary | ICD-10-CM

## 2020-09-13 MED ORDER — SODIUM CHLORIDE 0.9 % IV SOLN: 500.0000 mL | Freq: Once | INTRAVENOUS | Status: DC

## 2020-09-13 NOTE — Telephone Encounter (Signed)
Patient underwent colonoscopy today for surveillance of rectal cancer. No concerning pathology noted. She has been experiencing ongoing abdominal pain upper and mid abdomen ongoing now for a few months. CT scan without clear cause. Colonoscopy without clear cause. She also has back pain. She has had ventral hernias repaired in past and mesh in place. She has constant pain that is positional, I think perhaps musculoskeletal or neuropathic given negative CT and colonoscopy thus far. We discussed options. I would like to refer her to sports medicine, [name] [name] for his input regarding her back / persistent abdominal pain. Otherwise she may need additional paperwork for her work as she is not able to work right now for due to her symptoms, she will let us know.   [name] [name] can you please refer this patient to [employer] sports medicine for abdominal / back pain. She may need a copy of this note otherwise to give to her employer. Thanks

## 2020-09-13 NOTE — Progress Notes (Signed)
Called to room to assist during endoscopic procedure.  Patient ID and intended procedure confirmed with present staff. Received instructions for my participation in the procedure from the performing physician.  

## 2020-09-13 NOTE — Telephone Encounter (Signed)
Ambulatory referral to sports medicine (Dr. Gardenia Phlegm) in epic.

## 2020-09-13 NOTE — Progress Notes (Signed)
Report given to PACU, vss 

## 2020-09-13 NOTE — Op Note (Signed)
[facility] Patient Name: [name] [name] Procedure Date: 09/13/2020 7:54 AM MRN: [id number] Endoscopist: Remo Lipps P. Havery Moros , MD Age: 60 Referring MD:  Date of Birth: 25-Apr-1960 Gender: Female Account #: [id number] Procedure:                Colonoscopy Indications:              High risk colon cancer surveillance: Personal                            history of rectal cancer (2007)., in remission,                            ongoing abdominal pain which is positional yet                            present constantly, history of hernia repair with                            mesh, recent CT negative. Patient does endorse                            constipation, not relieved with fiber                            supplementation Medicines:                Monitored Anesthesia Care Procedure:                Pre-Anesthesia Assessment:                           - Prior to the procedure, a History and Physical                            was performed, and patient medications and                            allergies were reviewed. The patient's tolerance of                            previous anesthesia was also reviewed. The risks                            and benefits of the procedure and the sedation                            options and risks were discussed with the patient.                            All questions were answered, and informed consent                            was obtained. Prior Anticoagulants: The patient has  taken no previous anticoagulant or antiplatelet                            agents. ASA Grade Assessment: II - A patient with                            mild systemic disease. After reviewing the risks                            and benefits, the patient was deemed in                            satisfactory condition to undergo the procedure.                           After obtaining informed consent, the colonoscope                             was passed under direct vision. Throughout the                            procedure, the patient's blood pressure, pulse, and                            oxygen saturations were monitored continuously. The                            Olympus [id number]-[id number] (#[id number]) Colonoscope was                            introduced through the anus and advanced to the the                            terminal ileum, with identification of the                            appendiceal orifice and IC valve. The colonoscopy                            was performed without difficulty. The patient                            tolerated the procedure well. The quality of the                            bowel preparation was good. The terminal ileum,                            ileocecal valve, appendiceal orifice, and rectum                            were photographed. Scope In: 2:35:57 AM Scope Out: 8:20:59 AM Scope Withdrawal Time: 0 hours 11 minutes 44 seconds  Total Procedure Duration: 0 hours 14  minutes 15 seconds  Findings:                 The perianal and digital rectal examinations were                            normal.                           The terminal ileum appeared normal.                           A diminutive polyp was found in the cecum. The                            polyp was sessile. The polyp was removed with a                            cold snare. Resection and retrieval were complete.                           A 4 mm polyp was found in the transverse colon. The                            polyp was flat. The polyp was removed with a cold                            snare. Resection and retrieval were complete.                           A few small-mouthed diverticula were found in the                            sigmoid colon.                           There was evidence of a prior end-to-end                            colo-colonic anastomosis in the rectum. This was                             patent and was characterized by healthy appearing                            mucosa.                           The exam was otherwise without abnormality.                            Retroflexed views of the rectum not obtained given                            small size of rectum. Complications:  No immediate complications. Estimated blood loss:                            Minimal. Estimated Blood Loss:     Estimated blood loss was minimal. Impression:               - The examined portion of the ileum was normal.                           - One diminutive polyp in the cecum, removed with a                            cold snare. Resected and retrieved.                           - One 4 mm polyp in the transverse colon, removed                            with a cold snare. Resected and retrieved.                           - Diverticulosis in the sigmoid colon.                           - Patent end-to-end colo-colonic anastomosis,                            characterized by healthy appearing mucosa.                           - The examination was otherwise normal.                           No cause for abdominal pain on this exam, which may                            be neuropathic / musculoskeletal given workup to                            date. May refer to sports medicine for evaluation                            of persisent pain, will discuss options with patient Recommendation:           - Patient has a contact number available for                            emergencies. The signs and symptoms of potential                            delayed complications were discussed with the                            patient. Return to normal activities tomorrow.  Written discharge instructions were provided to the                            patient.                           - Resume previous diet.                           - Continue present  medications.                           - Start Miralax daily and titrate up as needed for                            daily bowel movement                           - Await pathology results.                           - Will discuss referral to sports medicine with                            patient for ongoing abdominal pain as above Remo Lipps P. Eternity Dexter, MD 09/13/2020 8:27:59 AM This report has been signed electronically.

## 2020-09-13 NOTE — Progress Notes (Signed)
[place] vitals and EW IV. PT vomited 30 minutes after her second Sutab dose this am. She reports that she has yellow/Clear liquid this am with her stool.

## 2020-09-13 NOTE — Progress Notes (Signed)
0818 HR > 100 with esmolol 25 mg given IV, MD updated, vss  

## 2020-09-13 NOTE — Patient Instructions (Signed)
YOU HAD AN ENDOSCOPIC PROCEDURE TODAY AT THE [facility]:   Refer to the procedure report that was given to you for any specific questions about what was found during the examination.  If the procedure report does not answer your questions, please call your gastroenterologist to clarify.  If you requested that your care partner not be given the details of your procedure findings, then the procedure report has been included in a sealed envelope for you to review at your convenience later.  YOU SHOULD EXPECT: Some feelings of bloating in the abdomen. Passage of more gas than usual.  Walking can help get rid of the air that was put into your GI tract during the procedure and reduce the bloating. If you had a lower endoscopy (such as a colonoscopy or flexible sigmoidoscopy) you may notice spotting of blood in your stool or on the toilet paper. If you underwent a bowel prep for your procedure, you may not have a normal bowel movement for a few days.  Please Note:  You might notice some irritation and congestion in your nose or some drainage.  This is from the oxygen used during your procedure.  There is no need for concern and it should clear up in a day or so.  SYMPTOMS TO REPORT IMMEDIATELY:   Following lower endoscopy (colonoscopy or flexible sigmoidoscopy):  Excessive amounts of blood in the stool  Significant tenderness or worsening of abdominal pains  Swelling of the abdomen that is new, acute  Fever of 100F or higher   Following upper endoscopy (EGD)  Vomiting of blood or coffee ground material  New chest pain or pain under the shoulder blades  Painful or persistently difficult swallowing  New shortness of breath  Fever of 100F or higher  Black, tarry-looking stools  For urgent or emergent issues, a gastroenterologist can be reached at any hour by calling [phone]. Do not use MyChart messaging for urgent concerns.    DIET:  We do recommend a small meal at first, but  then you may proceed to your regular diet.  Drink plenty of fluids but you should avoid alcoholic beverages for 24 hours.  ACTIVITY:  You should plan to take it easy for the rest of today and you should NOT DRIVE or use heavy machinery until tomorrow (because of the sedation medicines used during the test).    FOLLOW UP: Our staff will call the number listed on your records 48-72 hours following your procedure to check on you and address any questions or concerns that you may have regarding the information given to you following your procedure. If we do not reach you, we will leave a message.  We will attempt to reach you two times.  During this call, we will ask if you have developed any symptoms of COVID 19. If you develop any symptoms (ie: fever, flu-like symptoms, shortness of breath, cough etc.) before then, please call [phone].  If you test positive for Covid 19 in the 2 weeks post procedure, please call and report this information to us.    If any biopsies were taken you will be contacted by phone or by letter within the next 1-3 weeks.  Please call us at [phone] if you have not heard about the biopsies in 3 weeks.    SIGNATURES/CONFIDENTIALITY: You and/or your care partner have signed paperwork which will be entered into your electronic medical record.  These signatures attest to the fact that that the information above on   your After Visit Summary has been reviewed and is understood.  Full responsibility of the confidentiality of this discharge information lies with you and/or your care-partner. 

## 2020-09-14 ENCOUNTER — Telehealth: Payer: Self-pay | Admitting: Physician Assistant

## 2020-09-14 NOTE — Telephone Encounter (Signed)
Pt called requesting med records from the following dates to be faxed over to [employer] that is managing her FMLA. Fax it to [phone], Attn Laurel Dimmer.  09/01/20 - OV 09/08/20 - CT scan abd 09/13/20 - colon   Pt is asking for a follow up call with her. She is getting frustrated because it is hard to get a hold of FMLA cia. She stated that she signed a release form with them over a month ago and nobody has followed up with her. Pls call her.

## 2020-09-15 ENCOUNTER — Ambulatory Visit (INDEPENDENT_AMBULATORY_CARE_PROVIDER_SITE_OTHER): Payer: No Typology Code available for payment source

## 2020-09-15 ENCOUNTER — Telehealth: Payer: Self-pay

## 2020-09-15 ENCOUNTER — Other Ambulatory Visit: Payer: Self-pay

## 2020-09-15 ENCOUNTER — Encounter: Payer: Self-pay | Admitting: Family Medicine

## 2020-09-15 ENCOUNTER — Ambulatory Visit: Payer: No Typology Code available for payment source | Admitting: Family Medicine

## 2020-09-15 VITALS — BP 130/90 | HR 81 | Ht 60.0 in | Wt 187.0 lb

## 2020-09-15 DIAGNOSIS — M25559 Pain in unspecified hip: Secondary | ICD-10-CM

## 2020-09-15 DIAGNOSIS — N8189 Other female genital prolapse: Secondary | ICD-10-CM | POA: Diagnosis not present

## 2020-09-15 DIAGNOSIS — M2550 Pain in unspecified joint: Secondary | ICD-10-CM

## 2020-09-15 DIAGNOSIS — M545 Low back pain, unspecified: Secondary | ICD-10-CM | POA: Diagnosis not present

## 2020-09-15 DIAGNOSIS — G8929 Other chronic pain: Secondary | ICD-10-CM

## 2020-09-15 LAB — COMPREHENSIVE METABOLIC PANEL
ALT: 14 U/L (ref 0–35)
AST: 16 U/L (ref 0–37)
Albumin: 4.2 g/dL (ref 3.5–5.2)
Alkaline Phosphatase: 72 U/L (ref 39–117)
BUN: 10 mg/dL (ref 6–23)
CO2: 26 mEq/L (ref 19–32)
Calcium: 8.9 mg/dL (ref 8.4–10.5)
Chloride: 107 mEq/L (ref 96–112)
Creatinine, Ser: 0.92 mg/dL (ref 0.40–1.20)
GFR: 67.2 mL/min (ref >60.00)
Glucose, Bld: 126 mg/dL — ABNORMAL HIGH (ref 70–99)
Potassium: 3.8 mEq/L (ref 3.5–5.1)
Sodium: 141 mEq/L (ref 135–145)
Total Bilirubin: 0.5 mg/dL (ref 0.2–1.2)
Total Protein: 6.7 g/dL (ref 6.0–8.3)

## 2020-09-15 LAB — CBC WITH DIFFERENTIAL/PLATELET
Basophils Absolute: 0 10*3/uL (ref 0.0–0.1)
Basophils Relative: 0.3 % (ref 0.0–3.0)
Eosinophils Absolute: 0.1 10*3/uL (ref 0.0–0.7)
Eosinophils Relative: 1.7 % (ref 0.0–5.0)
HCT: 37.1 % (ref 36.0–46.0)
Hemoglobin: 11.8 g/dL — ABNORMAL LOW (ref 12.0–15.0)
Lymphocytes Relative: 52.2 % — ABNORMAL HIGH (ref 12.0–46.0)
Lymphs Abs: 2.1 10*3/uL (ref 0.7–4.0)
MCHC: 31.7 g/dL (ref 30.0–36.0)
MCV: 70.2 fl — ABNORMAL LOW (ref 78.0–100.0)
Monocytes Absolute: 0.4 10*3/uL (ref 0.1–1.0)
Monocytes Relative: 9 % (ref 3.0–12.0)
Neutro Abs: 1.5 10*3/uL (ref 1.4–7.7)
Neutrophils Relative %: 36.8 % — ABNORMAL LOW (ref 43.0–77.0)
Platelets: 231 10*3/uL (ref 150.0–400.0)
RBC: 5.29 Mil/uL — ABNORMAL HIGH (ref 3.87–5.11)
RDW: 15 % (ref 11.5–15.5)
WBC: 4.1 10*3/uL (ref 4.0–10.5)

## 2020-09-15 LAB — VITAMIN D 25 HYDROXY (VIT D DEFICIENCY, FRACTURES): VITD: 10.65 ng/mL — ABNORMAL LOW (ref 30.00–100.00)

## 2020-09-15 LAB — FERRITIN: Ferritin: 50.9 ng/mL (ref 10.0–291.0)

## 2020-09-15 LAB — SEDIMENTATION RATE: Sed Rate: 14 mm/hr (ref 0–30)

## 2020-09-15 LAB — TSH: TSH: 2.71 u[IU]/mL (ref 0.35–5.50)

## 2020-09-15 IMAGING — DX DG LUMBAR SPINE COMPLETE 4+V
5 series · 5 of 5 positions shown · non-contrast
Comparison: None.

CLINICAL DATA: Back pain. History of colon cancer. No known injury.

EXAM:
LUMBAR SPINE - COMPLETE 4+ VIEW

[l-spine ap]
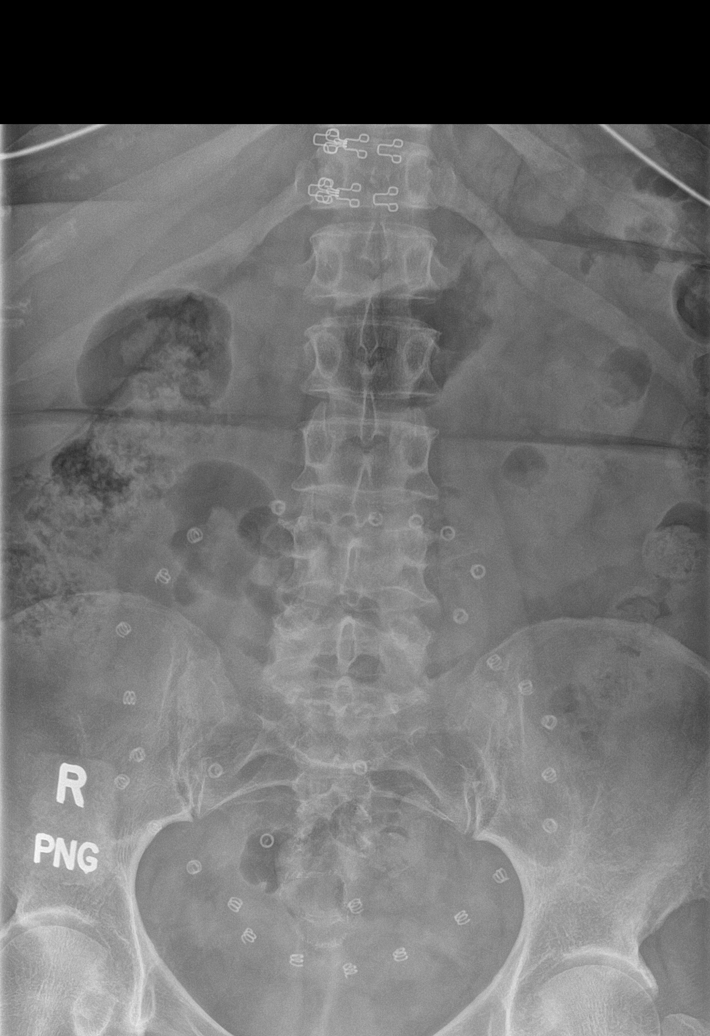

[l-spine obl (1 of 2)]
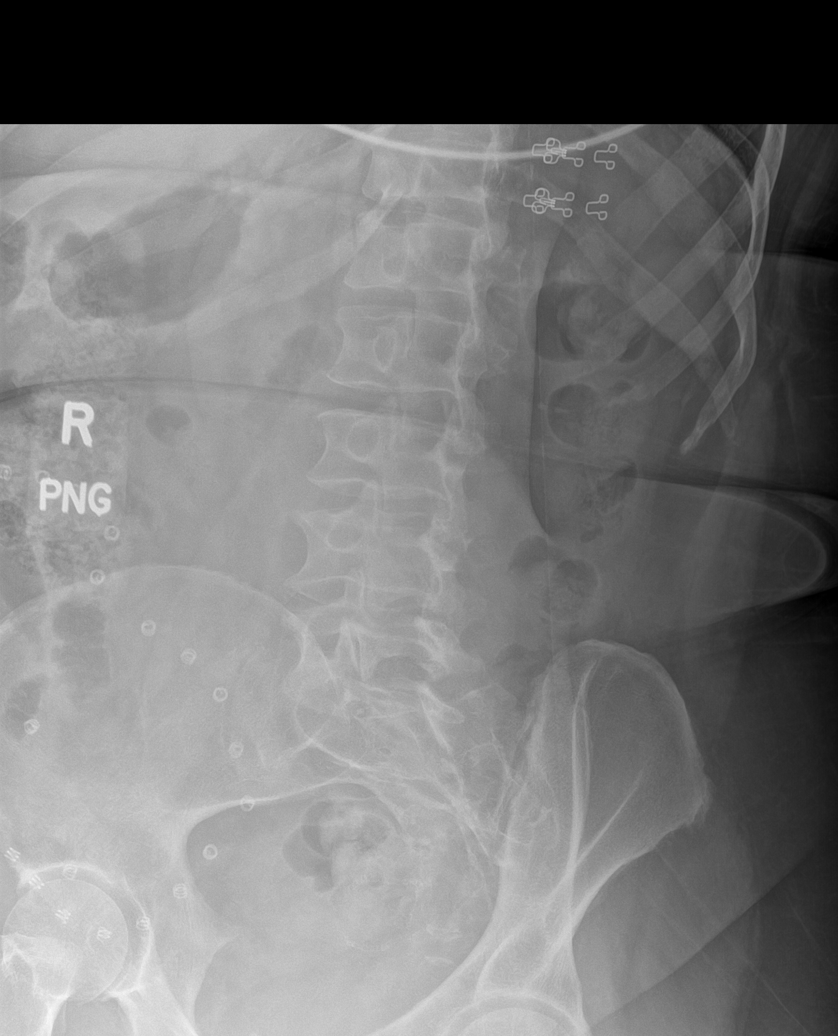

[l-spine obl (2 of 2)]
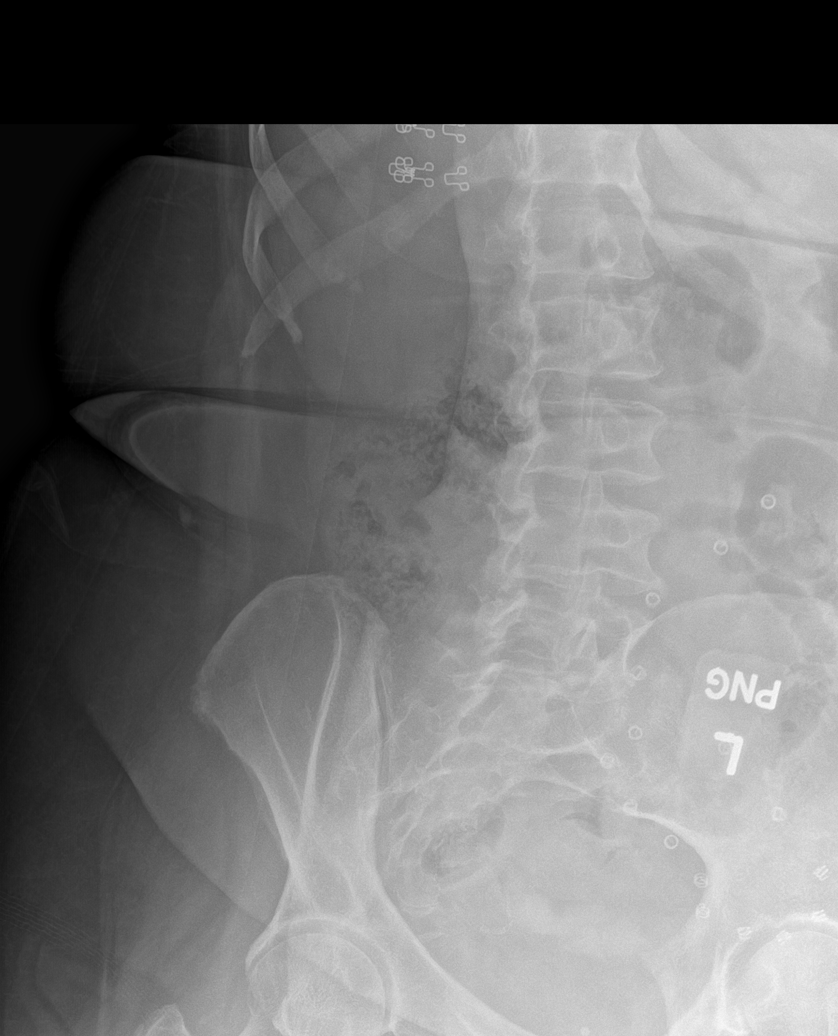

[l-spine lateral]
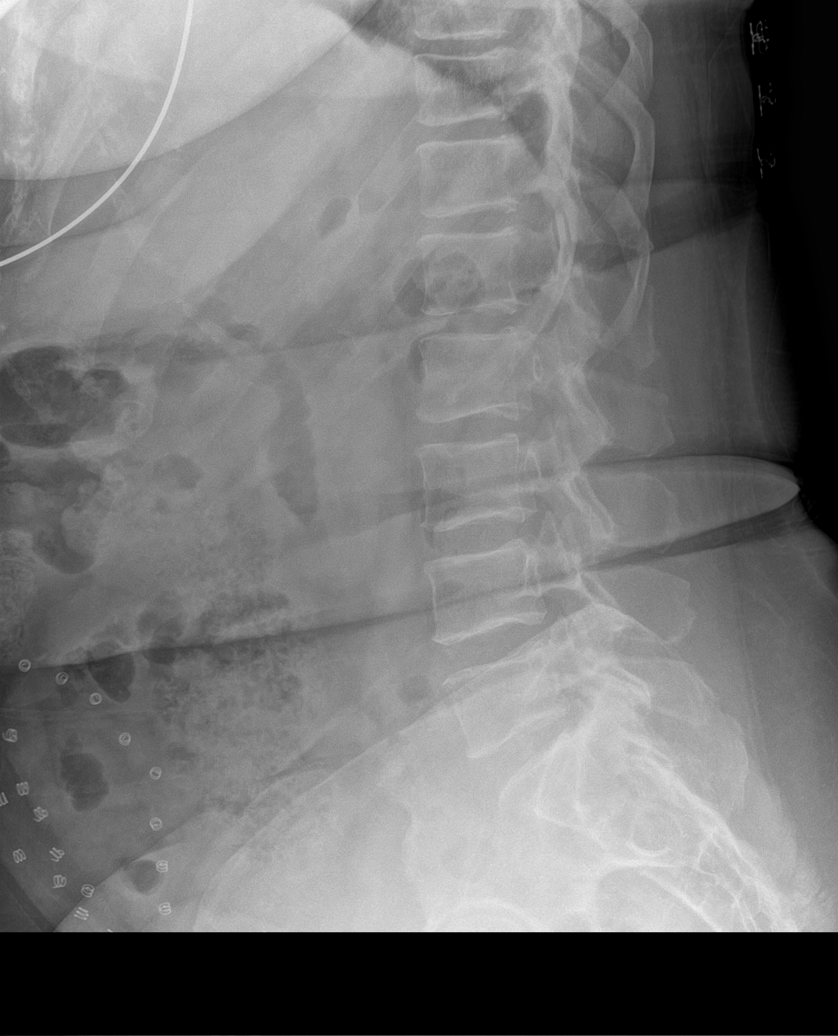

[l-spine spot]
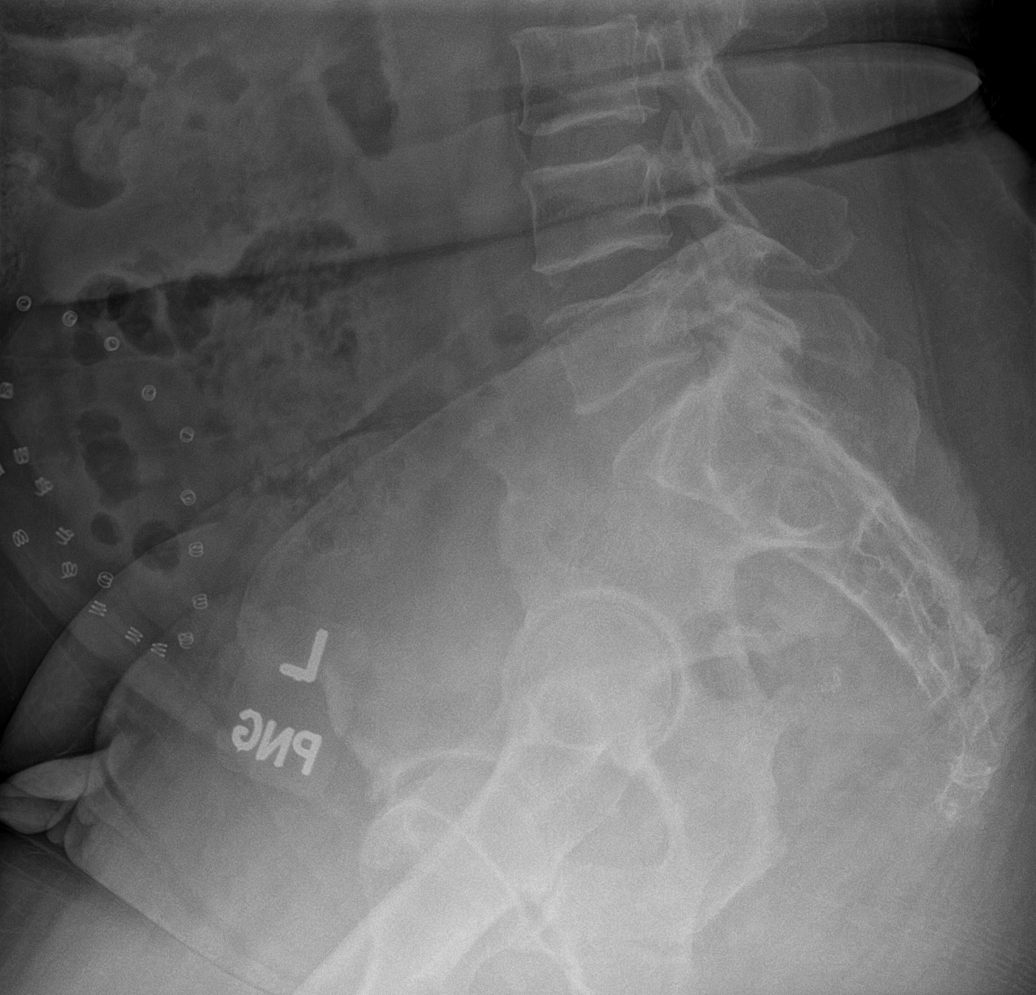

[5 of 5 positions shown; findings below may reference images not displayed]

FINDINGS: Hernia mesh is identified over the lower abdomen and pelvis. No
malalignment. No fractures. No bony lesions. Minimal degenerative
disc disease at several levels also with small anterior osteophytes.
Mild lower lumbar facet degenerative changes. No other
abnormalities.
IMPRESSION: Mild degenerative changes as above. No other significant
abnormalities.

## 2020-09-15 IMAGING — DX DG PELVIS 1-2V
1 series · 1 of 1 positions shown · non-contrast
Comparison: None.

CLINICAL DATA: Pain.

EXAM:
PELVIS - 1-2 VIEW

[pelvis ap]
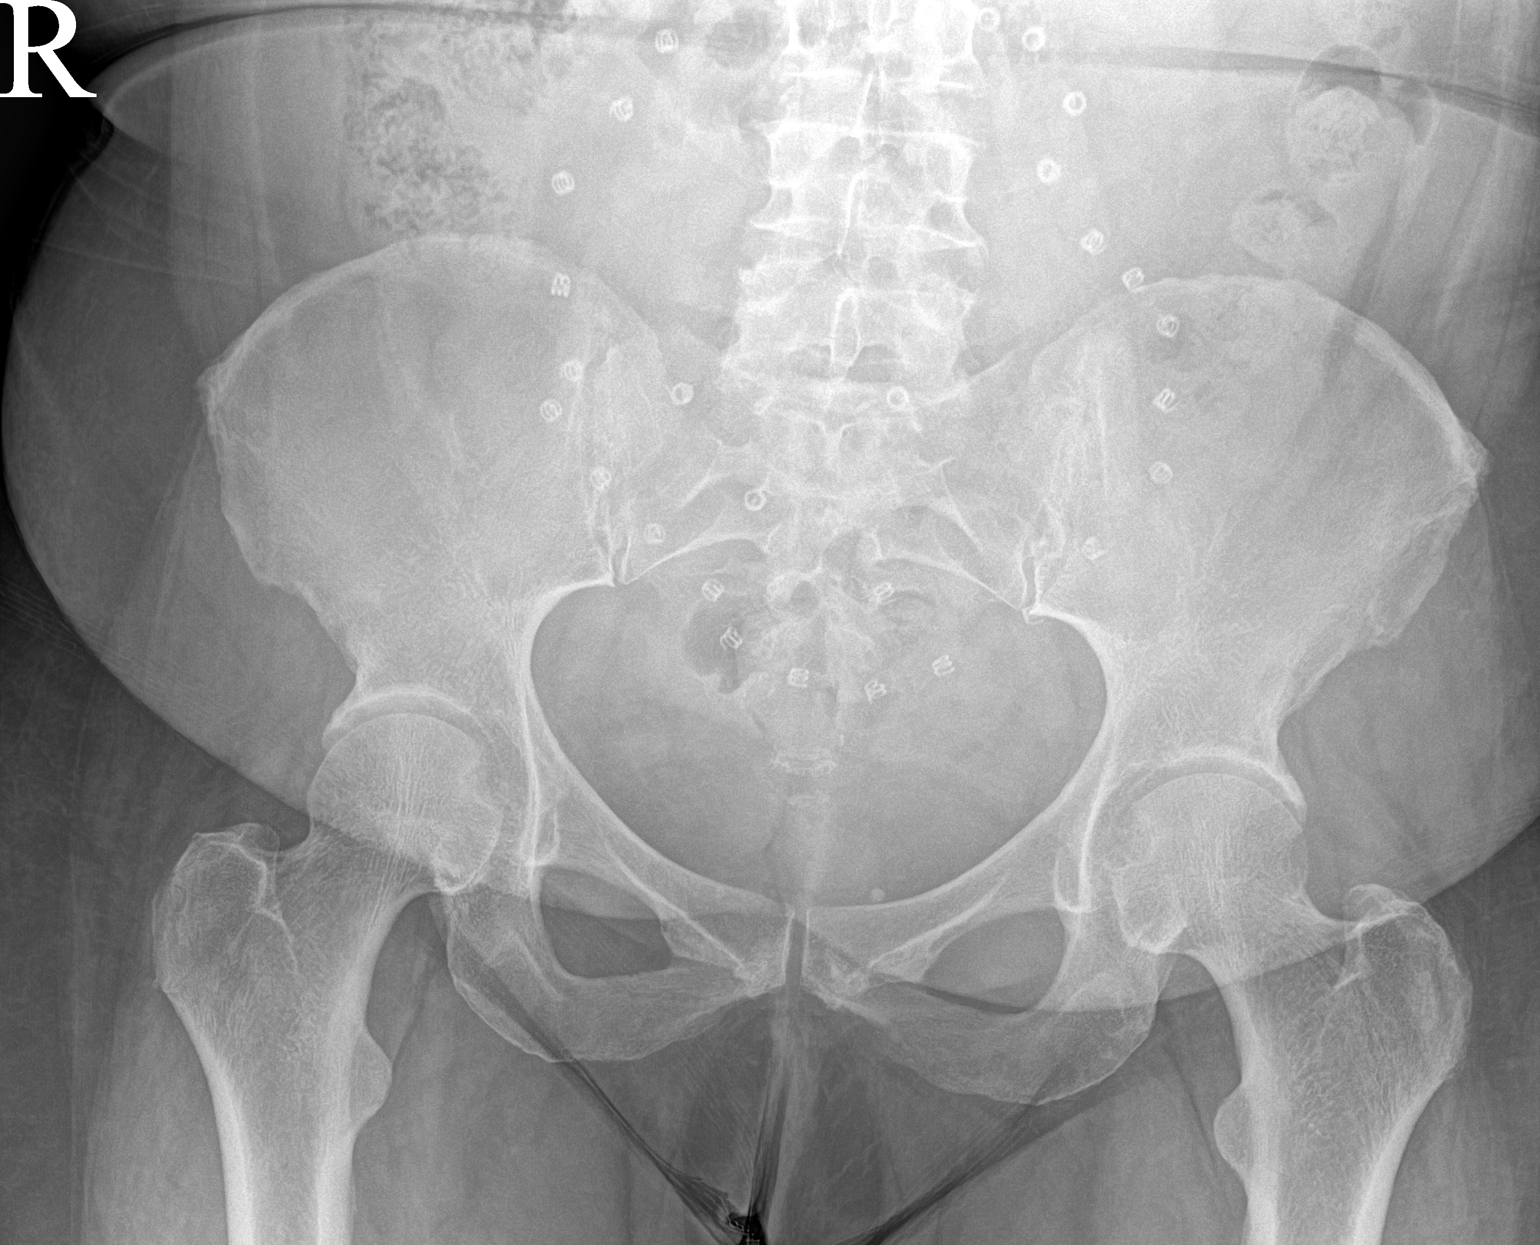

[1 of 1 positions shown; findings below may reference images not displayed]

FINDINGS: Mild degenerative changes in the hips without significant loss of
joint space. No other cause for pain identified.
IMPRESSION: Mild degenerative changes in the hips. No other cause for pain
identified.

## 2020-09-15 NOTE — Assessment & Plan Note (Signed)
Patient has low back pain.  Has some radiation of symptoms to go more to the pelvic.  Patient does have potentially some pelvic floor dysfunction that I think is also potentially contributing.  We will get patient in with formal physical therapy that I think will be beneficial.  In addition to this we will get x-rays of the pelvis as well as the lumbar spine to further evaluate.  Patient does have a history of colon cancer and if any significant worsening symptoms I do think that MRI of the lumbar and pelvis would be necessary.  Patient is in agreement with the plan.  We will get some laboratory work-up as well with patient having a history of the cancer.  Follow-up with me again in 6 weeks.

## 2020-09-15 NOTE — Telephone Encounter (Signed)
  Follow up Call-  Call back number 09/13/2020  Post procedure Call Back phone  # [phone]  Permission to leave phone message Yes  Some recent data might be hidden     Patient questions:  Do you have a fever, pain , or abdominal swelling? No. Pain Score  0 *  Have you tolerated food without any problems? Yes.    Have you been able to return to your normal activities? Yes.    Do you have any questions about your discharge instructions: Diet   No. Medications  No. Follow up visit  No.  Do you have questions or concerns about your Care? No.  Actions: * If pain score is 4 or above: No action needed, pain <4.  Have you developed a fever since your procedure? No  2.   Have you had an respiratory symptoms (SOB or cough) since your procedure? no  3.   Have you tested positive for COVID 19 since your procedure no  4.   Have you had any family members/close contacts diagnosed with the COVID 19 since your procedure?  no   If yes to any of these questions please route to Joylene John, RN and Joella Prince, RN

## 2020-09-15 NOTE — Progress Notes (Signed)
[facility] [address] [place] Phone: [phone] Subjective:   I [name] [name] am serving as a Education administrator for Dr. Hulan Saas.  This visit occurred during the SARS-CoV-2 public health emergency.  Safety protocols were in place, including screening questions prior to the visit, additional usage of staff PPE, and extensive cleaning of exam room while observing appropriate contact time as indicated for disinfecting solutions.   I'm seeing this patient by the request  of:  Marrian Salvage, FNP, armbruster   CC: Low back and pelvis pain  SHF:[id number]  [name] [name] is a 60 y.o. female coming in with complaint of pelvic pain. Patient states she has hernias and mesh in the area. States it might be related to the hernias. Patient states sometimes it is difficult sitting up and it feels like someone has punched her in the stomach. Been out of work since June 6th. Pain can sometimes radiate to the low back and under her right breast.   Onset- 2 years  Location - abdominal/ pelvic Duration- pain starts as soon as she gets up and is painful throughout the day Character- sharp Aggravating factors- Working, sitting Reliving factors-  Therapies tried-  Severity- 10/10 at its worse and can be over 10  Patient is seen gastroenterology who did do a colonoscopy with patient's history of colon cancer.  No significant findings.  CT scan of the abdomen and pelvis was independently visualized by me today.  CT scan was July 7.  Patient did have a lower anterior resection on June with no evidence of any recurrent metastatic disease.  Patient also has findings of ventral hernia mesh repair bilaterally.  Tiny hiatal hernia noted.  Past Medical History:  Diagnosis Date   Abdominal hernia    4 hernias per pt   Abdominal pain    mid abd pain radiating to right side   Anemia    Cancer (HCC)    rectal ca   Change in bowel movement    Colon cancer  (HCC)    Complication of anesthesia    cardiac arrest during nasal surgery   Diabetes mellitus without complication ([place])    Headache    History of rectal bleeding    Past Surgical History:  Procedure Laterality Date   CESAREAN SECTION     colon cancer     COLON SURGERY     COLONOSCOPY     LAPAROSCOPIC LYSIS OF ADHESIONS  04/02/2014   Procedure: LAPAROSCOPIC LYSIS OF ADHESIONS;  Surgeon: Jackolyn Confer, MD;  Location: [facility];  Service: General;;   NOSE SURGERY     POLYPECTOMY     VENTRAL HERNIA REPAIR N/A 04/02/2014   Procedure: LAPAROSCOPIC  LYSIS OF ADHESIONS AND REPAIR OF VENTRAL INCISIONAL HERNIAS WITH MESH;  Surgeon: Jackolyn Confer, MD;  Location: [facility];  Service: General;  Laterality: N/A;   Social History   Socioeconomic History   Marital status: Married    Spouse name: Not on file   Number of children: 3   Years of education: 12   Highest education level: Not on file  Occupational History   Occupation: Customer Service  Tobacco Use   Smoking status: Former    Packs/day: 0.25    Years: 13.00    Pack years: 3.25    Types: Cigarettes    Quit date: 03/14/2003    Years since quitting: 17.5   Smokeless tobacco: Never  Vaping Use   Vaping Use: Some days  Substances: Flavoring   Devices: flavored , non-nicotine  Substance and Sexual Activity   Alcohol use: Yes    Alcohol/week: 0.0 standard drinks    Comment: occasional    Drug use: No   Sexual activity: Not Currently  Other Topics Concern   Not on file  Social History Narrative   Fun: Camping, Cruise    Denies abuse and feels safe at home.    Social Determinants of Health   Financial Resource Strain: Not on file  Food Insecurity: Not on file  Transportation Needs: Not on file  Physical Activity: Not on file  Stress: Not on file  Social Connections: Not on file   Allergies  Allergen Reactions   Epinephrine Anaphylaxis   Lidocaine Anaphylaxis   Lisinopril     itching   Metformin And Related     GI  upset   Family History  Problem Relation Age of Onset   Uterine cancer Mother    Colon cancer Maternal Aunt    Breast cancer Paternal Aunt    Colon cancer Maternal Grandmother    Breast cancer Cousin    Breast cancer Cousin    Diabetes Neg Hx    Rectal cancer Neg Hx    Stomach cancer Neg Hx     Current Outpatient Medications (Endocrine & Metabolic):    Dulaglutide (TRULICITY) 1.5 RW/4.3XV SOPN, INJECT WEEKLY AS DIRECTED  Current Outpatient Medications (Cardiovascular):    atorvastatin (LIPITOR) 20 MG tablet, TAKE 1 TABLET BY MOUTH EVERY DAY   Current Outpatient Medications (Analgesics):    ibuprofen (ADVIL,MOTRIN) 200 MG tablet, Take 400 mg by mouth every 6 (six) hours as needed for mild pain or moderate pain. Reported on 07/12/2015   Current Outpatient Medications (Other):    Biotin 1 MG CAPS, Take by mouth.   glucose blood test strip, Use as instructed   Lancets (ONETOUCH ULTRASOFT) lancets, Use as instructed   Multiple Vitamin (MULTIVITAMIN WITH MINERALS) TABS tablet, Take 1 tablet by mouth daily.   nystatin cream (MYCOSTATIN), Apply 1 application topically 2 (two) times daily.   nystatin-triamcinolone (MYCOLOG II) cream, Apply 1 application topically 2 (two) times daily.   triamcinolone (KENALOG) 0.1 %, Apply 1 application topically 2 (two) times daily.   Reviewed prior external information including notes and imaging from  primary care provider As well as notes that were available from care everywhere and other healthcare systems.  Past medical history, social, surgical and family history all reviewed in electronic medical record.  No pertanent information unless stated regarding to the chief complaint.   Review of Systems:  No headache, visual changes, nausea, vomiting, diarrhea, constipation, dizziness, , skin rash, fevers, chills, night sweats, weight loss, swollen lymph nodes, , joint swelling, chest pain, shortness of breath, mood changes. POSITIVE muscle aches,  abdominal pain, body aches  Objective  Blood pressure 130/90, pulse 81, height 5' (1.524 m), weight 187 lb (84.8 kg), SpO2 99 %.   General: No apparent distress alert and oriented x3 mood and affect normal, dressed appropriately.  HEENT: Pupils equal, extraocular movements intact  Respiratory: Patient's speak in full sentences and does not appear short of breath  Cardiovascular: No lower extremity edema, non tender, no erythema  Gait normal with good balance and coordination.  MSK: Mild arthritic changes of multiple joints. Patient does have a distended stomach noted.  Patient does have what appears to be a fairly large stool burden noted in the left lower quadrant.  No masses appreciated.  Patient is diffusely in  the bilateral lower quadrants.  Mild voluntary guarding noted. Low back exam does have some loss of lordosis.  Significant tightness with FABER test bilaterally.  Patient has no pain with internal range of motion.  Mild pain on noted of the flexion of the hips with resistance.  Patient does have full strength noted.  Neurovascularly intact distally.  Back exam does have some mild increase in discomfort with extension of the back.   Impression and Recommendations:     The above documentation has been reviewed and is accurate and complete Lyndal Pulley, DO

## 2020-09-15 NOTE — Telephone Encounter (Signed)
Spoke to patient and informed her that this information would need to come from our medical records dept. I gave her the new number to medical records. Patient was very nice and understanding.

## 2020-09-15 NOTE — Telephone Encounter (Signed)
Patient states [name] need all actual office notes sent to [email or web address] ( [phone]) please call patient when done

## 2020-09-15 NOTE — Patient Instructions (Addendum)
Good to see you Lumbar and pelvis xray Murelax 17 grams daily with 100 mg of colace for 1 week  Vitamin D 2000 mg Tart cherry 1200 mg at night PT for pelvic floor [place] IF not much better in 2-3 weeks send message we will order MRI See me again in 4-5 weeks

## 2020-09-19 NOTE — Telephone Encounter (Signed)
I have called pt back and informed her that we have sent in the paperwork and we are only able to send notes from [facility]. Pt stated that they did get that information and her claim has been denied regards to getting her payment. Pt will call her HR to see if he is able to give [place] some more information.

## 2020-09-19 NOTE — Telephone Encounter (Signed)
Notes have been addressed in another encounter.

## 2020-09-20 ENCOUNTER — Encounter: Payer: Self-pay | Admitting: Family Medicine

## 2020-09-21 ENCOUNTER — Encounter: Payer: Self-pay | Admitting: Physical Therapy

## 2020-09-21 ENCOUNTER — Other Ambulatory Visit: Payer: Self-pay

## 2020-09-21 ENCOUNTER — Ambulatory Visit: Payer: No Typology Code available for payment source | Attending: Family Medicine | Admitting: Physical Therapy

## 2020-09-21 DIAGNOSIS — R2689 Other abnormalities of gait and mobility: Secondary | ICD-10-CM

## 2020-09-21 DIAGNOSIS — R252 Cramp and spasm: Secondary | ICD-10-CM | POA: Insufficient documentation

## 2020-09-21 DIAGNOSIS — M6281 Muscle weakness (generalized): Secondary | ICD-10-CM | POA: Diagnosis present

## 2020-09-21 NOTE — Patient Instructions (Signed)
[email or web address]     Access Code: [email or web address] URL: [email or web address] Date: 09/21/2020  Exercises Supine Diaphragmatic Breathing - 1 x daily - 7 x weekly - 2 sets - 5 reps - 2 hold   [facility] [address], [place], [place] [address] Phone # [phone] Fax [phone]

## 2020-09-21 NOTE — Therapy (Addendum)
[facility] [address] [place], [place], [address] Phone: [phone]   Fax:  [phone]  Physical Therapy Evaluation  Patient Details  Name: [name] [name] MRN: [id number] Date of Birth: 30-Mar-1960 Referring Provider (PT): Lyndal Pulley, DO   Encounter Date: 09/21/2020   PT End of Session - 09/21/20 1318     Visit Number 1    Number of Visits 90    Authorization Type [employer]    Authorization - Number of Visits 90    PT Start Time 1941    PT Stop Time 1315    PT Time Calculation (min) 29 min    Activity Tolerance Patient tolerated treatment well;No increased pain    Behavior During Therapy WFL for tasks assessed/performed             Past Medical History:  Diagnosis Date   Abdominal hernia    4 hernias per pt   Abdominal pain    mid abd pain radiating to right side   Anemia    Cancer (HCC)    rectal ca   Change in bowel movement    Colon cancer (HCC)    Complication of anesthesia    cardiac arrest during nasal surgery   Diabetes mellitus without complication ([place])    Headache    History of rectal bleeding     Past Surgical History:  Procedure Laterality Date   CESAREAN SECTION     colon cancer     COLON SURGERY     COLONOSCOPY     LAPAROSCOPIC LYSIS OF ADHESIONS  04/02/2014   Procedure: LAPAROSCOPIC LYSIS OF ADHESIONS;  Surgeon: Jackolyn Confer, MD;  Location: [facility];  Service: General;;   NOSE SURGERY     POLYPECTOMY     VENTRAL HERNIA REPAIR N/A 04/02/2014   Procedure: LAPAROSCOPIC  LYSIS OF ADHESIONS AND REPAIR OF VENTRAL INCISIONAL HERNIAS WITH MESH;  Surgeon: Jackolyn Confer, MD;  Location: [facility];  Service: General;  Laterality: N/A;    There were no vitals filed for this visit.    Subjective Assessment - 09/21/20 1249     Subjective Pt reports chronic history of abdominal pain and radiating into low back and pelvis for over 2 years. Pt reports she has tried to increase working  out to relief pain but this made pain worse, now unable to work due to pain, since June 6th 2022. Pt reports she thinks pain is made worse with prolonged sitting time with working from home.    Limitations Lifting;Standing;Other (comment)   driving   Patient Stated Goals to have less pain    Currently in Pain? Yes    Pain Score 8     Pain Location Abdomen    Pain Orientation Right;Left    Pain Descriptors / Indicators Pressure    Pain Radiating Towards into low back and pelvis    Pain Onset More than a month ago    Pain Frequency Intermittent    Aggravating Factors  transitions, standing, working out    Pain Relieving Factors walking at fairly quick pace, reclined position                [facility] PT Assessment - 09/21/20 0001       Assessment   Medical Diagnosis N81.89 (ICD-10-CM) - Pelvic floor weakness    Referring Provider (PT) Lyndal Pulley, DO    Onset Date/Surgical Date --   at least 2 years ago   Prior Therapy no  Precautions   Precautions None      Restrictions   Weight Bearing Restrictions No      Balance Screen   Has the patient fallen in the past 6 months No    Has the patient had a decrease in activity level because of a fear of falling?  No    Is the patient reluctant to leave their home because of a fear of falling?  No      Home Ecologist residence    Living Arrangements Spouse/significant other    Type of [place] One level      Prior Function   Level of [place] --   full time but unable to work since june 6th     Cognition   Overall Cognitive Status Within Functional Limits for tasks assessed      ROM / Strength   AROM / PROM / Strength AROM;Strength      AROM   Overall AROM Comments limited globally due to pain, by 50% in all directions of thoracic and lumbar spine, 25% hamstrings and adductors      Strength   Strength Assessment Site Hip    Right/Left Hip  Right;Left    Right Hip Flexion 2+/5    Right Hip Extension 2+/5    Right Hip External Rotation  2+/5    Right Hip Internal Rotation 2+/5    Right Hip ABduction 2+/5    Right Hip ADduction 2+/5    Left Hip Flexion 2+/5    Left Hip Extension 2+/5    Left Hip External Rotation 2+/5    Left Hip Internal Rotation 2+/5    Left Hip ABduction 2+/5    Left Hip ADduction 2+/5      Flexibility   Soft Tissue Assessment /Muscle Length yes      Palpation   Palpation comment tender to palpation throughout abdomen worse at lower Rt and Lt quadrants, suprapubic, and pain with hip MMT      Special Tests   Other special tests pain with stork test on both sides                        Objective measurements completed on examination: See above findings.     Pelvic Floor Special Questions - 09/21/20 0001     Are you Pregnant or attempting pregnancy? No    Prior Pregnancies Yes    Number of Pregnancies 3    Number of C-Sections 1    Number of Vaginal Deliveries 2    Any difficulty with labor and deliveries Yes   hemmorrage reported after 3rd   Episiotomy Performed No    Currently Sexually Active Yes    Is this Painful Yes    History of sexually transmitted disease No    Marinoff Scale pain prevents any attempts at intercourse    Urinary Leakage No    Urinary urgency No    Urinary frequency no    Fecal incontinence No    Falling out feeling (prolapse) No    Pelvic Floor Internal Exam deferred'                      PT Education - 09/21/20 1308     Education Details Access Code: [email or web address]. Pt also educated on relaxation techniques, breathing techniques, exam findings, HEP    Person(s) Educated Patient  Methods Explanation;Demonstration;Tactile cues;Verbal cues;Handout    Comprehension Verbalized understanding;Returned demonstration              PT Short Term Goals - 09/21/20 1359       PT SHORT TERM GOAL #1   Title pt to be I with HEP    Time 6     Period Weeks    Status New    Target Date 11/02/20      PT SHORT TERM GOAL #2   Title pt to report no more than 6/10 pelvic or abdominal pain with mobility to improve pt's tolerance to activity and improve QOL    Time 6    Period Weeks    Status New    Target Date 11/02/20      PT SHORT TERM GOAL #3   Title pt to demonstrated improved trunk flexibility to no more than 25% restricted to improve pt's pain    Time 6    Period Weeks    Status New    Target Date 11/02/20      PT SHORT TERM GOAL #4   Title pt to be I with body mechanic corrections and techniques for lifting and functional mobility to decrease pain    Time 6    Period Weeks    Target Date 11/02/20      PT SHORT TERM GOAL #5   Title Be able to complete internal vaginal exam    Time 6    Period Weeks    Status New    Target Date 11/02/20               PT Long Term Goals - 09/21/20 1402       PT LONG TERM GOAL #1   Title pt to be I with advanced HEP    Time 4    Period Weeks    Status New    Target Date 01/22/21      PT LONG TERM GOAL #2   Title pt to report no more than 4/10 abdominal and pelvic pain with mobility to improve tolerance to activity    Time 4    Period Months    Target Date 01/22/21      PT LONG TERM GOAL #3   Title pt to demonstrate ability to lift 15# with proper lifting mechanics to promote improved functional mobility and decreased pain    Time 4    Period Months    Status New    Target Date 01/22/21      PT LONG TERM GOAL #4   Title pt to demonstrate at least 3/5 pelvic floor strength with good ability to fully relax approp.    Time 4    Period Months    Target Date 01/22/21                    Plan - 09/21/20 1319     Clinical Impression Statement Pt is 60yo female presenting to clinic with chronic history of pain in abdomen radiating into back and pelvis which limited all mobility and function. Pt states she is unable to work due to pain. Pt has history of  ventral hernia mesh repair bil, rectal CA. Pt found to have pain with all mobility during session, limited in spinal mobility in all directions, poor hip strength bil, poor core activation, and noted pain with palpation throughout abdomen but worse in lower quadrants, anterior pelvic pain noted bil. Pt deferred pelvic exam this date due  to time with pt arriving late, and extra time needed during session for pain management and changing positions. pt also report pelvic pain with intercourse. Pt would benefit from PT to address all exam findings and attempt to decrease pain and improved QOL.    Personal Factors and Comorbidities Time since onset of injury/illness/exacerbation;Fitness;Comorbidity 2    Comorbidities ventral hernia mesh repair bilaterally, rectal ca    Examination-Activity Limitations Locomotion Level;Transfers;Sit;Carry;Squat;Stairs;Lift;Stand;Dressing;Bend;Reach Overhead;Bathing;Bed Mobility    Examination-Participation Restrictions Occupation;Cleaning;Community Activity;Driving;Interpersonal Relationship;Shop    Stability/Clinical Decision Making Evolving/Moderate complexity    Clinical Decision Making Moderate    Rehab Potential Good    PT Frequency 1x / week    PT Duration 12 weeks    PT Treatment/Interventions ADLs/Self Care Home Management;Functional mobility training;Therapeutic activities;Therapeutic exercise;Neuromuscular re-education;Taping;Patient/family education;Scar mobilization;Passive range of motion;Energy conservation    PT Next Visit Plan manual work at abdomen, relaxation techniques    PT Home Exercise Plan Access Code: [email or web address]    Consulted and Agree with Plan of Care Patient             Patient will benefit from skilled therapeutic intervention in order to improve the following deficits and impairments:  Decreased range of motion, Difficulty walking, Increased fascial restricitons, Increased muscle spasms, Decreased endurance, Decreased activity tolerance,  Pain, Improper body mechanics, Impaired flexibility, Decreased scar mobility, Decreased mobility, Decreased strength, Postural dysfunction  Visit Diagnosis: Other abnormalities of gait and mobility - Plan: PT plan of care cert/re-cert  Cramp and spasm - Plan: PT plan of care cert/re-cert  Muscle weakness (generalized) - Plan: PT plan of care cert/re-cert     Problem List Patient Active Problem List   Diagnosis Date Noted   Low back pain 09/15/2020   Left shoulder pain 07/10/2018   Abdominal pain 07/03/2018   Diarrhea 07/03/2018   Nausea with vomiting 07/03/2018   Urinary frequency 06/15/2016   Type 2 diabetes with complication ([place]) 16/60/6301   Hyperlipidemia associated with type 2 diabetes mellitus ([place]) 12/30/2012   Obesity (BMI 30-39.9) 12/23/2012   Colon cancer ([place]) 10/31/2011    Stacy Gardner, PT 07/20/223:40 PM   [facility] [address], [place], [place], [address] Phone: [phone]   Fax:  [phone]  Name: [name] [name] MRN: [id number] Date of Birth: [birth year]

## 2020-09-23 ENCOUNTER — Telehealth: Payer: Self-pay | Admitting: Family

## 2020-09-23 ENCOUNTER — Encounter: Payer: Self-pay | Admitting: Family

## 2020-09-23 NOTE — Telephone Encounter (Signed)
The patient states she was denied short-term disability. They believe she is capable of performing her job after reviewing all of the notes, so she is no longer being paid. According to her one of the doctors must have written something indicating she is able to work.  She has tons of appointments due to her ongoing pain. All the imaging seems to be normal with no logical explanation of why she is in so much pain. She would like Laura's opinion. She is also hoping Mickel Baas will be able to review all her notes to hopefully help her determine what could make them believe that she is OK to go back to work.

## 2020-09-23 NOTE — Telephone Encounter (Signed)
I have called the pt back and informed her that we have looked over the notes and we do not see where any provider had stated that she is deemed fit to go back to work.  I have advised her to speak to her HR, or Tax adviser.   Pt reports that she has started the appeal process. She is very frustrated that she is still having pain and she is unable to get her Short term disability approved.   She will call speak to someone in her company to find out what information is needed to get her appeal approved.   I have informed her that I will route this message to Mickel Baas to see what she recommends at this point.

## 2020-09-28 NOTE — Telephone Encounter (Signed)
Patient called about FMLA forms that were faxed here for Dr. Valere Dross.Marland Kitchenadvisef patient forms had been faxed over to Dr. Valere Dross at [facility] location.Marland Kitchen

## 2020-09-28 NOTE — Telephone Encounter (Signed)
I have called pt back since I got more FMLA paperwork for pt. She stated that she needs to go back to work on Aug 1st since her company has stopped paying her. She is requesting this FMLA to be for intermittent so it will cover her doctor appointments, PT, and to allow her to leave her desk if she is in pain. This should cover Aug until the end of the year for at least 3 hrs a week for pt and 8-12 hrs a month for pain relief and appointments.  Paperwork has been placed in providers red to be signed folder.

## 2020-09-28 NOTE — Telephone Encounter (Signed)
I have not seen these forms as of yet. I will note it when I do.

## 2020-09-29 ENCOUNTER — Ambulatory Visit: Payer: No Typology Code available for payment source | Admitting: Physical Therapy

## 2020-09-30 ENCOUNTER — Telehealth: Payer: Self-pay | Admitting: Physician Assistant

## 2020-09-30 ENCOUNTER — Telehealth: Payer: Self-pay | Admitting: Family Medicine

## 2020-09-30 ENCOUNTER — Telehealth: Payer: Self-pay | Admitting: Gastroenterology

## 2020-09-30 NOTE — Telephone Encounter (Signed)
I spoke with the patient about her FMLA concerns.  Her insurance company has denied her claims.  They tall her they do not have the medical records to support her claims.  Dr. Valere Dross, PCP, has completed the forms for her, but they want additional records sent to support the claims.  Patient is looking for assistance to get her claim approved.  I sent the records to the number listed on the [facility] paperwork scanned in her chart.  I will mail her the confirmations and the copy of the records once I have the fax confirmation.  Medical release for her records is in her chart.  She is encouraged to reach out to Dr. Creig Hines for additional supporting documentation on her care and case if [facility] requires more documentation.

## 2020-09-30 NOTE — Telephone Encounter (Signed)
Patient called stating that she has been having issues with getting paid for short term disability based on the last note from Dr Tamala Julian. She said they are going to work on an appeal but would need more information from Dr Tamala Julian based on why she was not able to work. 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 They have denied her claim at this time.  She is not able to go without pay so she has had to use her PTO and is going back to work on Monday.  [name] [name], her PCP, is working on intermittent FMLA for her at this time so that she is able to go to her future appointments.   She is scheduled for an appointment with Dr Tamala Julian on August 17th.  Would Dr Tamala Julian be able to write a letter for her to submit for an appeal and possible back pay from her short term disability?  Please advise.

## 2020-09-30 NOTE — Telephone Encounter (Signed)
Faxed confirmation received.  Records mailed to patient for her personal records.

## 2020-09-30 NOTE — Telephone Encounter (Signed)
error 

## 2020-09-30 NOTE — Telephone Encounter (Signed)
Patient called requesting a call back from nurse regarding FMLA paperwork that were submitted. Patient would like to discuss further states it was sent incomplete also has questions.

## 2020-10-01 ENCOUNTER — Other Ambulatory Visit: Payer: Self-pay | Admitting: Family

## 2020-10-04 ENCOUNTER — Other Ambulatory Visit: Payer: Self-pay

## 2020-10-04 DIAGNOSIS — R102 Pelvic and perineal pain: Secondary | ICD-10-CM

## 2020-10-04 DIAGNOSIS — G8929 Other chronic pain: Secondary | ICD-10-CM

## 2020-10-04 DIAGNOSIS — M545 Low back pain, unspecified: Secondary | ICD-10-CM

## 2020-10-04 NOTE — Progress Notes (Signed)
MRI ordered

## 2020-10-04 NOTE — Telephone Encounter (Signed)
Dr. Tamala Julian unable to write letter for patient per a verbal from him. He recommends that she get an MRI based on last conversation in office.

## 2020-10-04 NOTE — Telephone Encounter (Signed)
I have called pt to let her know that her FMLA form is ready for pick up and we did not have her part of it completed. She stated that she got the intermittent FMLA approved and didn't need Mickel Baas to complete the form any more. I stated understanding and kept it on file for now.   Pt went on to ask who she should appeal the STD claim that was denied. I have informed her that she needed to contact the [facility] to get that completed. Mickel Baas is not able to complete those forms.  We have seen her one time so far and the specialist is following her care. Pt was on the phone for over 20 min.

## 2020-10-06 ENCOUNTER — Other Ambulatory Visit: Payer: Self-pay | Admitting: Family

## 2020-10-06 ENCOUNTER — Ambulatory Visit: Payer: No Typology Code available for payment source | Admitting: Physical Therapy

## 2020-10-13 ENCOUNTER — Encounter: Payer: No Typology Code available for payment source | Admitting: Physical Therapy

## 2020-10-15 ENCOUNTER — Other Ambulatory Visit: Payer: No Typology Code available for payment source

## 2020-10-16 ENCOUNTER — Ambulatory Visit (HOSPITAL_COMMUNITY)
Admission: RE | Admit: 2020-10-16 | Discharge: 2020-10-16 | Disposition: A | Discharge status: Home / Self Care | Payer: No Typology Code available for payment source | Source: Ambulatory Visit | Attending: Family Medicine | Admitting: Family Medicine

## 2020-10-16 DIAGNOSIS — R102 Pelvic and perineal pain: Secondary | ICD-10-CM | POA: Diagnosis present

## 2020-10-16 DIAGNOSIS — M545 Low back pain, unspecified: Secondary | ICD-10-CM | POA: Diagnosis not present

## 2020-10-16 DIAGNOSIS — G8929 Other chronic pain: Secondary | ICD-10-CM

## 2020-10-16 IMAGING — MR MR LUMBAR SPINE W/O CM
5 series · 32 of 48 positions shown · non-contrast
Comparison: Radiographs [DATE]

CLINICAL DATA: Chronic bilateral low back pain without sciatica;
lumbar radiculopathy, no red flags; lumbar spine pain.

EXAM:
MRI LUMBAR SPINE WITHOUT CONTRAST
TECHNIQUE: Multiplanar, multisequence MR imaging of the lumbar spine was
performed. No intravenous contrast was administered.

[Series 5: T1 · sagittal · 4.0mm · 0.81mm/px · 7 of 17 slices shown (1 of 2)]
[im 1/17]
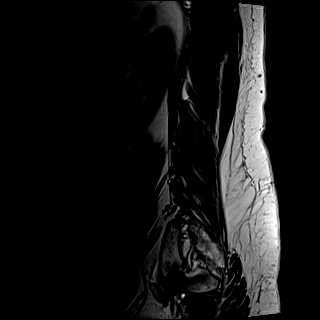
[im 3/17]
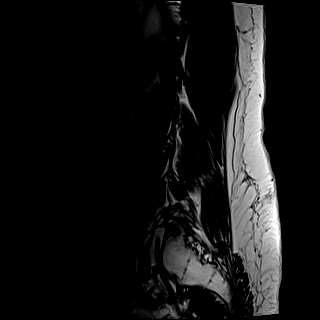
[im 6/17]
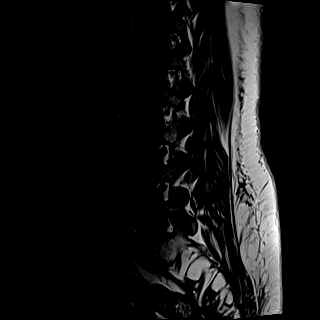
[im 9/17]
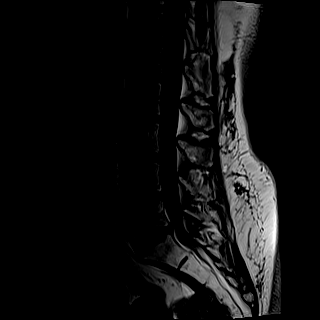
[im 11/17]
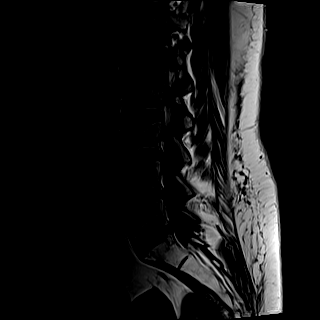
[im 14/17]
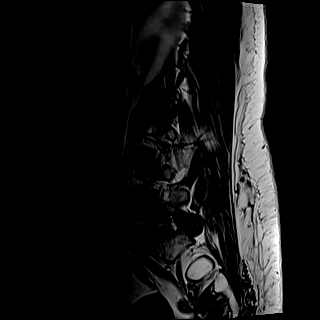
[im 17/17]
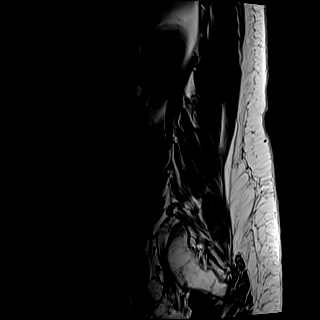

[Series 6: T2 · sagittal · 4.0mm · 0.81mm/px · 7 of 17 slices shown (1 of 2)]
[im 1/17]
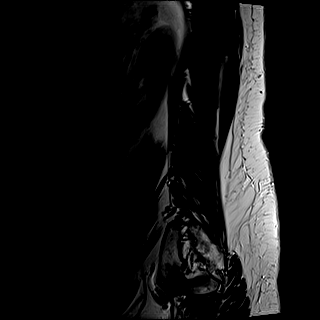
[im 3/17]
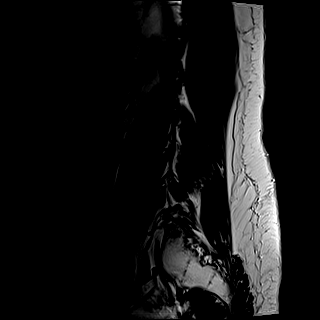
[im 6/17]
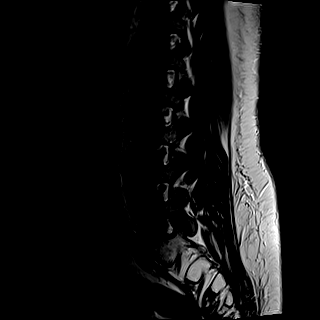
[im 9/17]
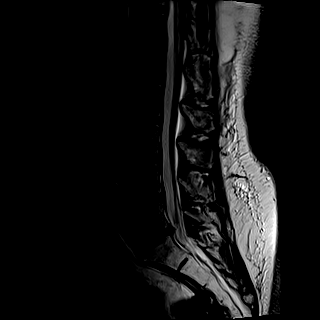
[im 11/17]
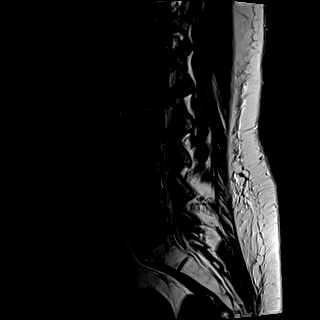
[im 14/17]
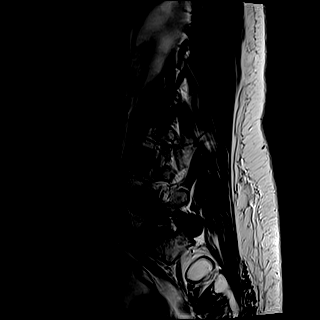
[im 17/17]
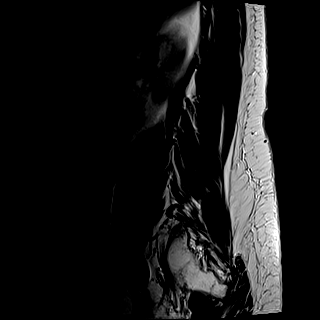

[Series 7: STIR · sagittal · 4.0mm · 0.51mm/px · 2 of 17 slices shown]
[im 1/17]
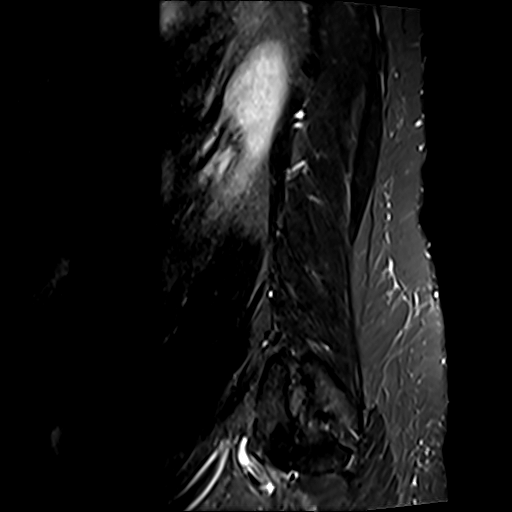
[im 4/17]
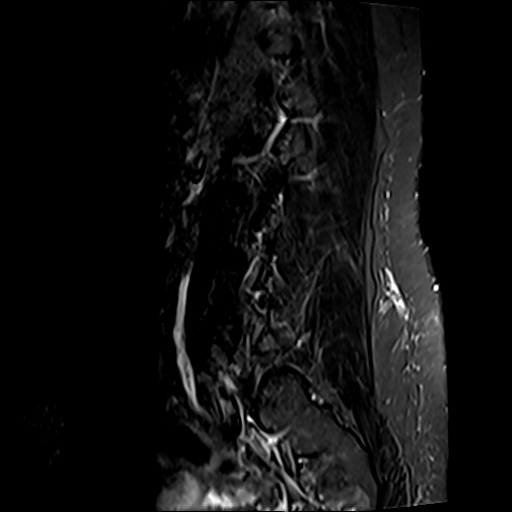

[Series 8: T2 · axial · 4.0mm · 0.62mm/px · z∈[-36,+165]mm · 8 of 38 slices shown (2 of 2)]
[im 1/38]
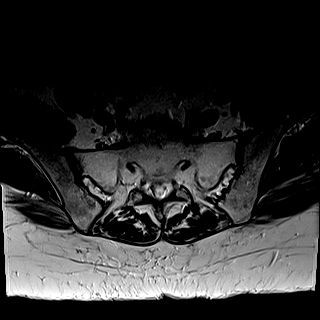
[im 6/38]
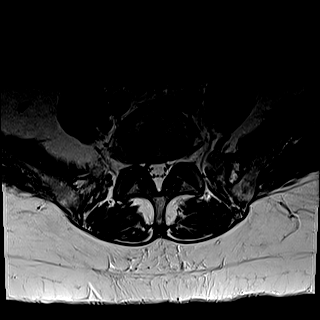
[im 12/38]
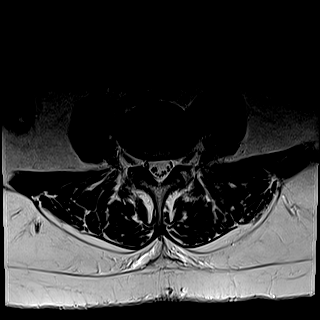
[im 18/38]
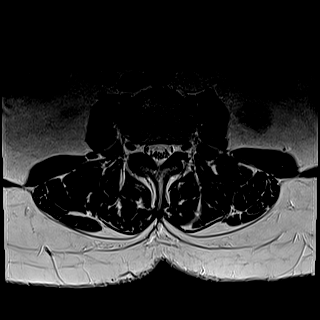
[im 20/38]
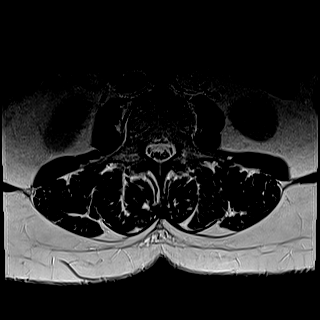
[im 26/38]
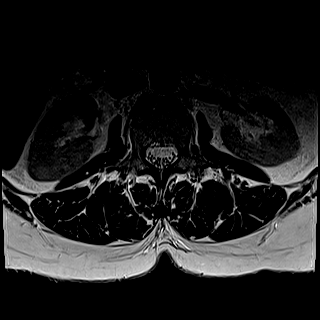
[im 32/38]
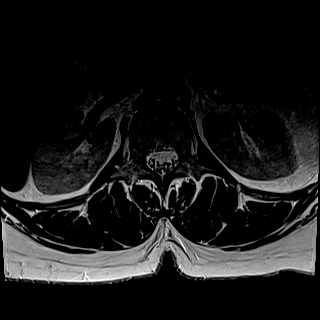
[im 38/38]
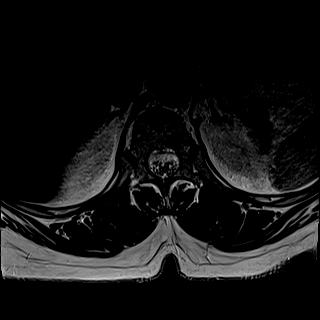

[Series 9: T1 · axial · 4.0mm · 0.39mm/px · z∈[-36,+165]mm · 8 of 38 slices shown (2 of 2)]
[im 1/38]
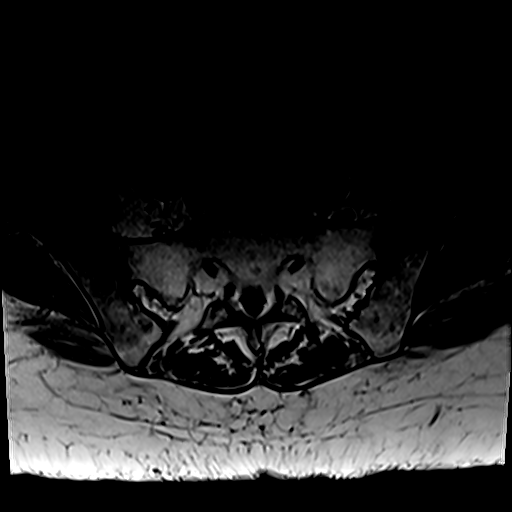
[im 6/38]
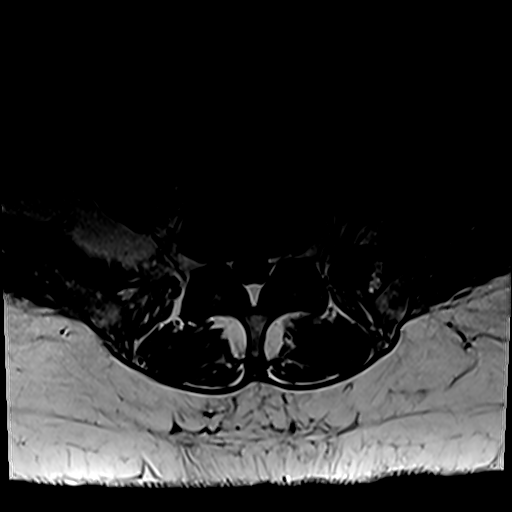
[im 12/38]
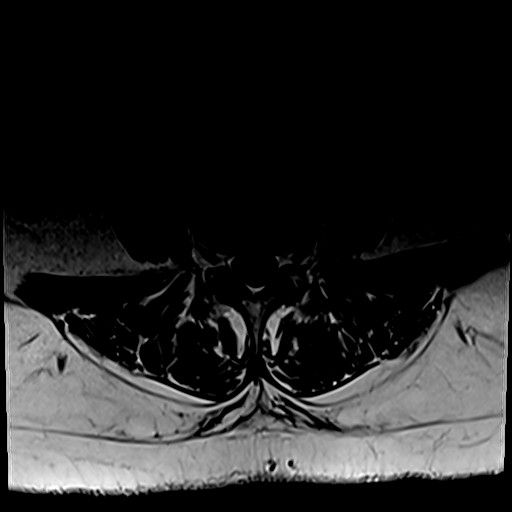
[im 18/38]
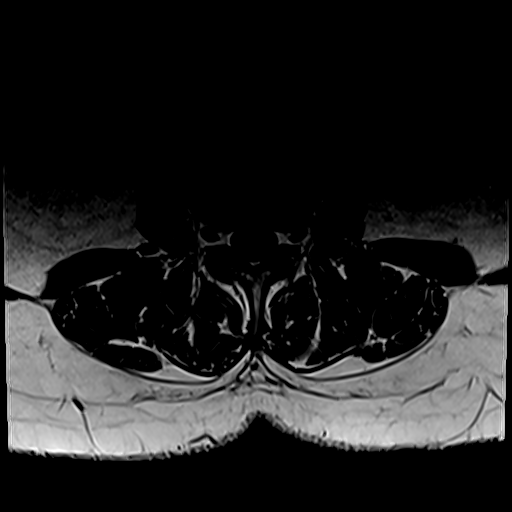
[im 20/38]
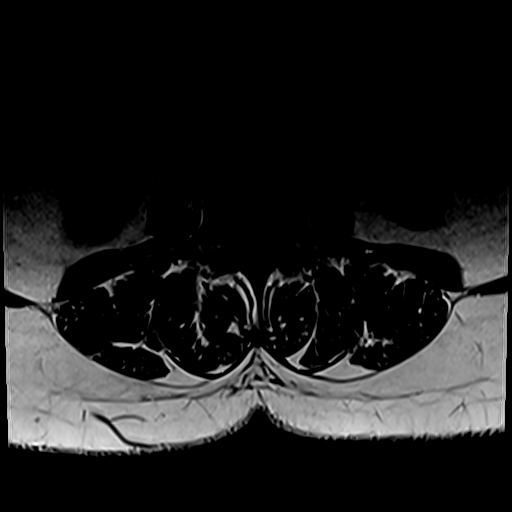
[im 26/38]
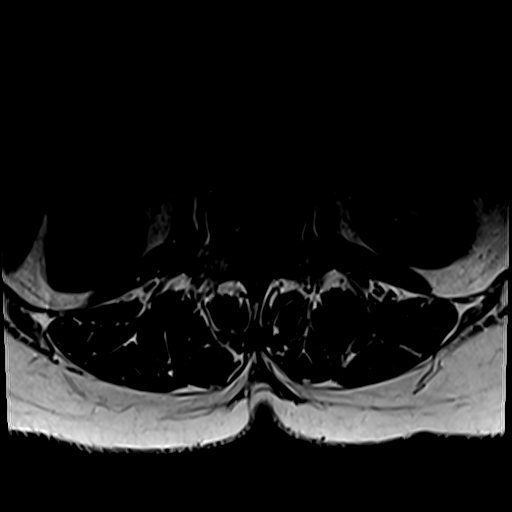
[im 32/38]
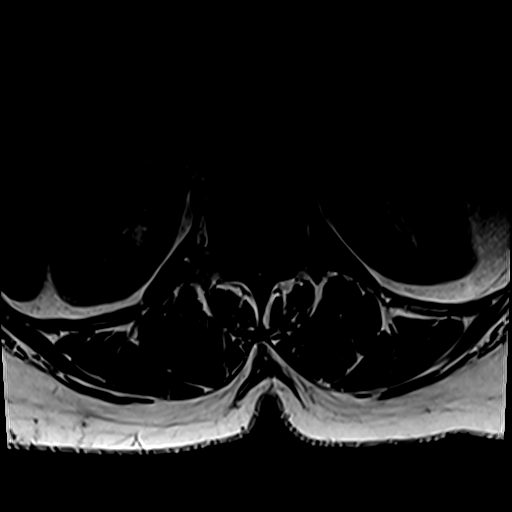
[im 38/38]
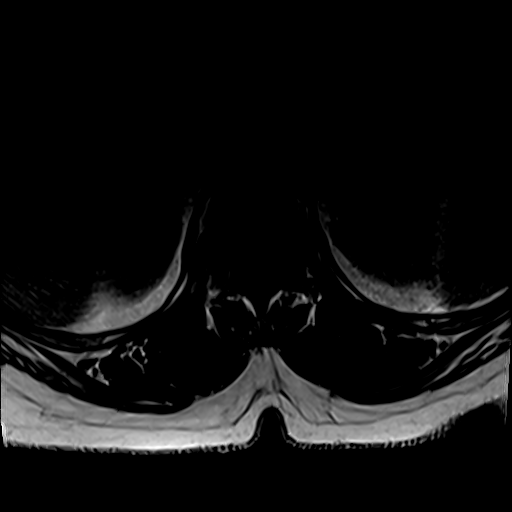

[32 of 48 positions shown; findings below may reference images not displayed]

FINDINGS: Segmentation:  Standard.

Alignment:  Physiologic.

Vertebrae: No fracture, evidence of discitis, or bone lesion. Mild
endplate degenerative changes at L5-S1.

Conus medullaris and cauda equina: Conus extends to the L1 level.
Conus and cauda equina appear normal.

Paraspinal and other soft tissues: Negative.

Disc levels:

T12-L1:No spinal canal or neural foraminal stenosis.

L1-2:No spinal canal or neural foraminal stenosis.

L2-3:No spinal canal or neural foraminal stenosis.

L3-4:No spinal canal or neural foraminal stenosis.

L4-5:Mild facet degenerative changes.No spinal canal or neural
foraminal stenosis.

L5-S1:Shallow disc bulge with superimposed small right foraminal
disc protrusion and mild facet degenerative changes resulting in
mild right neural foraminal narrowing. No significant spinal canal
stenosis.
IMPRESSION: 1. Small right foraminal disc protrusion at L5-S1 resulting in mild
right neural foramen.
2. No spinal canal stenosis at any level.

## 2020-10-16 IMAGING — MR MR PELVIS W/O CM
7 series · 48 of 48 positions shown · non-contrast
Comparison: Pelvic radiograph [DATE] and pelvic CT [DATE].

CLINICAL DATA: Pelvic pain, possible stress fracture. Pelvic floor
dysfunction and chronic low back pain.

EXAM:
MRI PELVIS WITHOUT CONTRAST
TECHNIQUE: Multiplanar multisequence MR imaging of the pelvis was performed. No
intravenous contrast was administered.

[Series 12: T1 · coronal · 4.0mm · 1.19mm/px · 5 of 36 slices shown (1 of 2)]
[im 1/36]
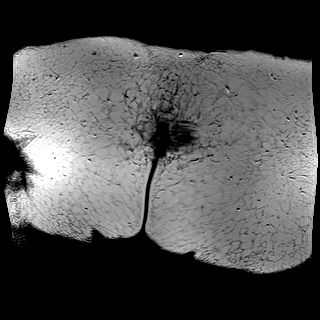
[im 9/36]
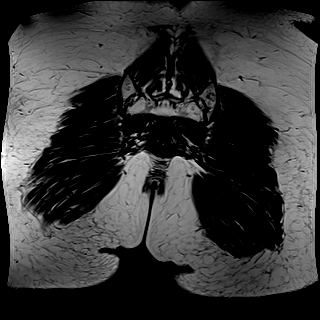
[im 18/36]
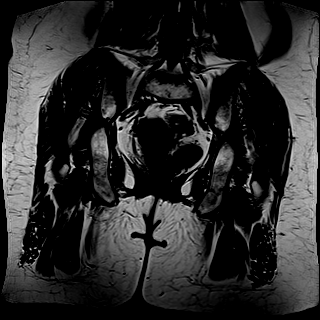
[im 27/36]
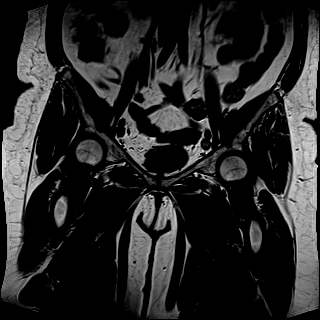
[im 36/36]
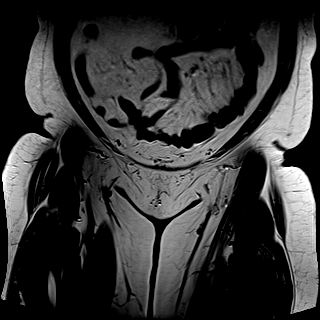

[Series 13: STIR · coronal · 4.0mm · 1.19mm/px · 5 of 36 slices shown]
[im 1/36]
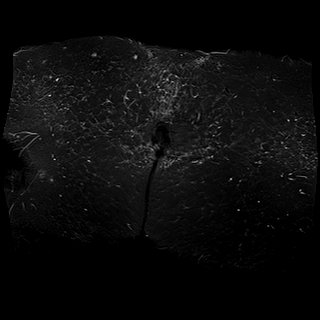
[im 9/36]
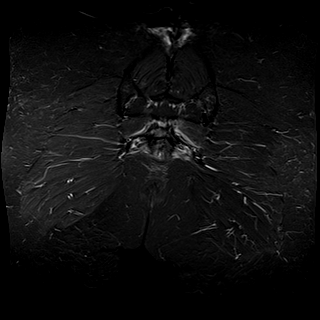
[im 18/36]
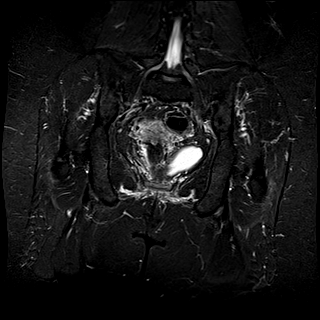
[im 27/36]
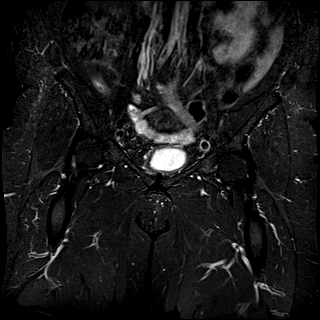
[im 36/36]
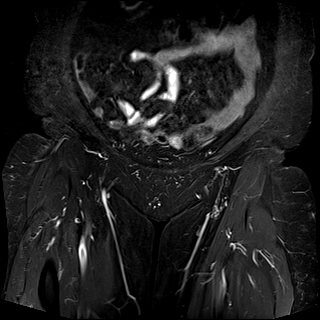

[Series 14: T1 · axial · 4.0mm · 1.12mm/px · z∈[-22,+198]mm · 7 of 45 slices shown (2 of 2)]
[im 1/45]
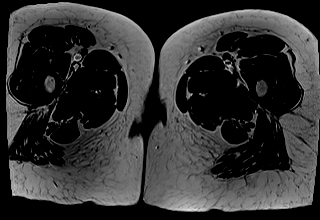
[im 8/45]
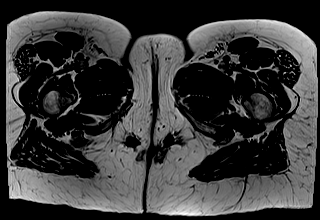
[im 15/45]
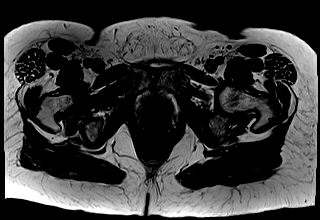
[im 23/45]
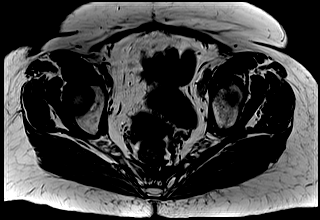
[im 30/45]
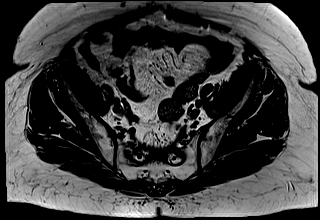
[im 37/45]
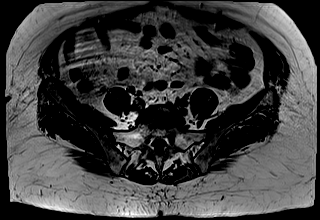
[im 45/45]
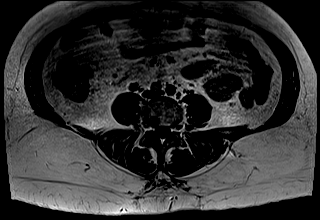

[Series 15: T2 fat-sat · axial · 4.0mm · 0.94mm/px · z∈[-24,+196]mm · 7 of 45 slices shown (1 of 2)]
[im 1/45]
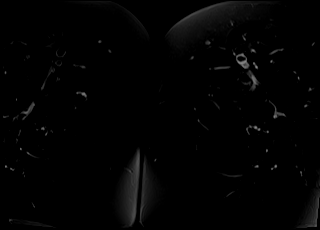
[im 8/45]
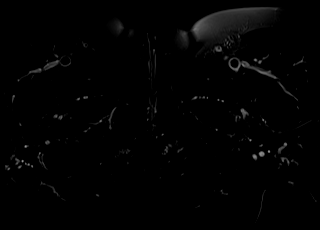
[im 15/45]
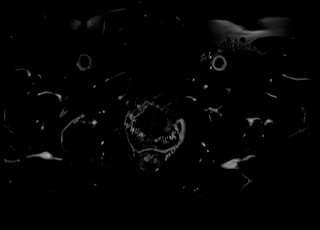
[im 23/45]
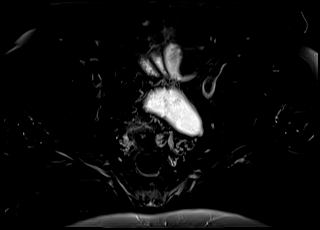
[im 30/45]
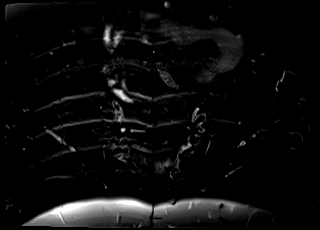
[im 37/45]
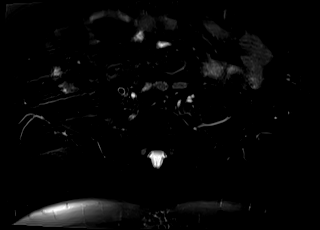
[im 45/45]
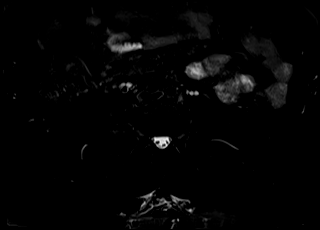

[Series 16: T2 fat-sat · sagittal · 5.0mm · 1.03mm/px · 8 of 50 slices shown (2 of 2)]
[im 1/50]
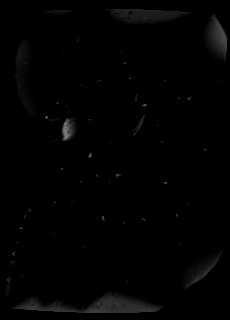
[im 8/50]
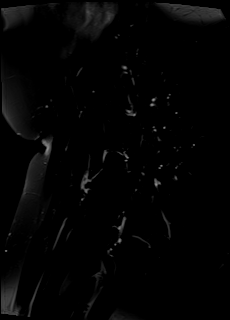
[im 15/50]
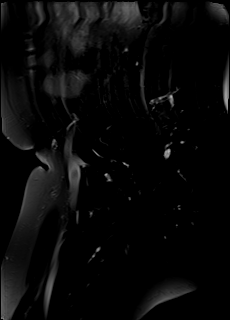
[im 22/50]
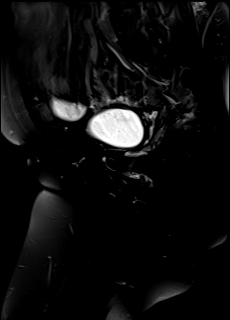
[im 29/50]
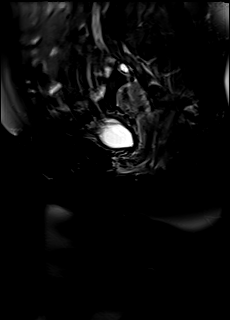
[im 36/50]
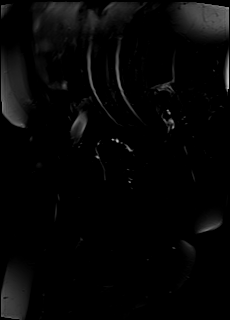
[im 43/50]
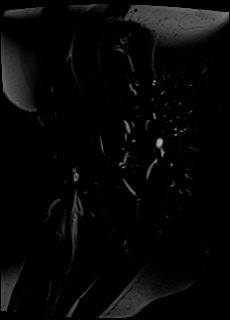
[im 50/50]
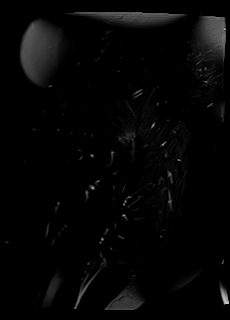

[Series 17: DWI · axial · 6.0mm · 1.36mm/px · z∈[-84,+167]mm · 11 of 72 slices shown (1 of 2)]
[im 1/72]
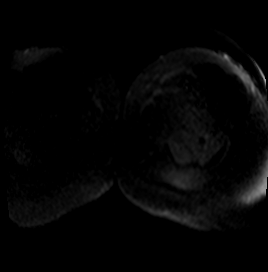
[im 8/72]
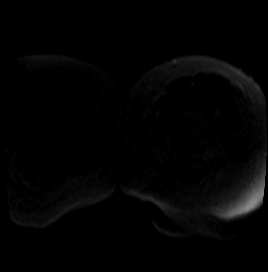
[im 15/72]
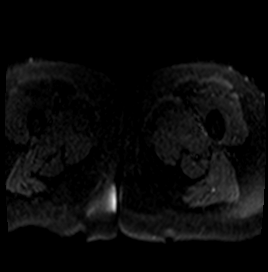
[im 22/72]
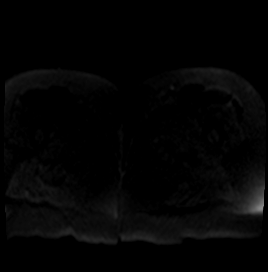
[im 29/72]
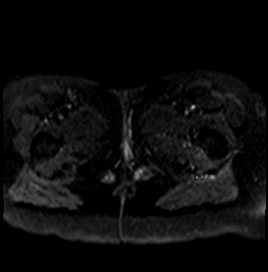
[im 36/72]
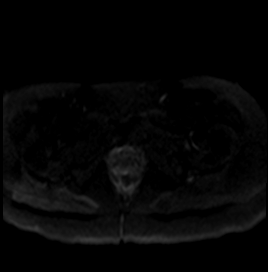
[im 43/72]
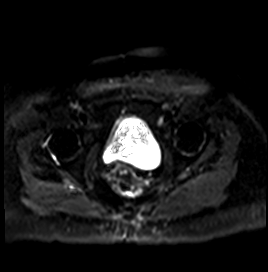
[im 50/72]
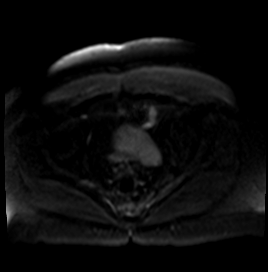
[im 57/72]
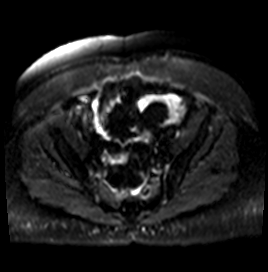
[im 64/72]
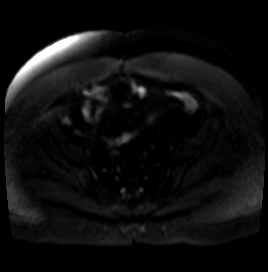
[im 72/72]
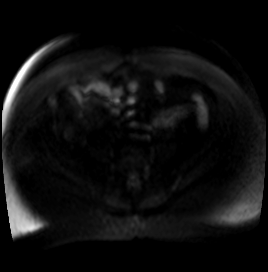

[Series 18: DWI · axial · 6.0mm · 1.36mm/px · z∈[-84,+167]mm · 5 of 36 slices shown (2 of 2)]
[im 1/36]
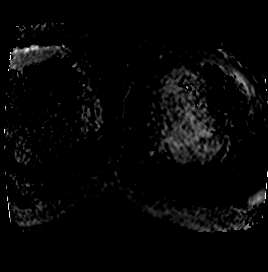
[im 9/36]
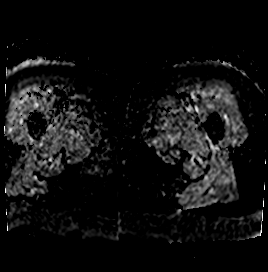
[im 18/36]
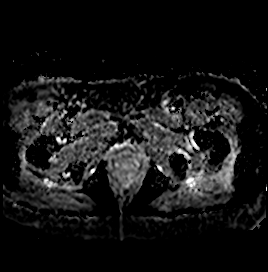
[im 27/36]
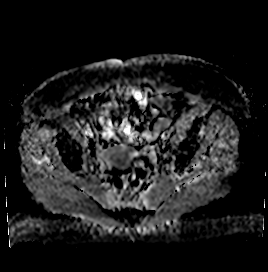
[im 36/36]
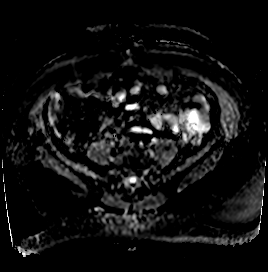

[48 of 48 positions shown; findings below may reference images not displayed]

FINDINGS: Bones/Joint/Cartilage

No acute osseous findings or suspicious marrow lesions. There is
increased fat signal in the sacrum attributed to pelvic radiation in
this patient with a history of colon cancer. The sacroiliac joints
and symphysis pubis appear normal. No evidence of femoral head
avascular necrosis. Lumbar spine findings dictated separately.

Ligaments

Unremarkable.

Muscles and Tendons
The pelvic muscles and hip tendons appear unremarkable.

Soft tissues
Stable radiation changes in the presacral fat. Previous abdominal
perineal resection and anastomosis. No evidence of pelvic fluid
collection or inflammatory change.
IMPRESSION: 1. Stable chronic radiation changes in the presacral fat and sacrum.
2. No acute findings or explanation for the patient's symptoms. No
evidence of pelvic stress/insufficiency fracture.

## 2020-10-17 ENCOUNTER — Encounter: Payer: Self-pay | Admitting: Family Medicine

## 2020-10-19 ENCOUNTER — Ambulatory Visit: Payer: No Typology Code available for payment source | Admitting: Family Medicine

## 2020-10-20 ENCOUNTER — Encounter: Payer: No Typology Code available for payment source | Admitting: Physical Therapy

## 2020-10-24 ENCOUNTER — Other Ambulatory Visit: Payer: Self-pay

## 2020-10-24 ENCOUNTER — Encounter: Payer: Self-pay | Admitting: Family Medicine

## 2020-10-24 ENCOUNTER — Ambulatory Visit: Payer: No Typology Code available for payment source | Admitting: Family Medicine

## 2020-10-24 ENCOUNTER — Ambulatory Visit (INDEPENDENT_AMBULATORY_CARE_PROVIDER_SITE_OTHER): Payer: No Typology Code available for payment source

## 2020-10-24 VITALS — BP 144/84 | HR 80 | Ht 60.0 in | Wt 188.0 lb

## 2020-10-24 DIAGNOSIS — R1011 Right upper quadrant pain: Secondary | ICD-10-CM

## 2020-10-24 DIAGNOSIS — G8929 Other chronic pain: Secondary | ICD-10-CM | POA: Diagnosis not present

## 2020-10-24 DIAGNOSIS — E559 Vitamin D deficiency, unspecified: Secondary | ICD-10-CM | POA: Insufficient documentation

## 2020-10-24 DIAGNOSIS — R0781 Pleurodynia: Secondary | ICD-10-CM | POA: Diagnosis not present

## 2020-10-24 DIAGNOSIS — M545 Low back pain, unspecified: Secondary | ICD-10-CM | POA: Diagnosis not present

## 2020-10-24 IMAGING — DX DG CHEST 2V
2 series · 2 of 2 positions shown · non-contrast
Comparison: [DATE]

CLINICAL DATA: Chronic right chest pain and right upper quadrant
pain.

EXAM:
CHEST - 2 VIEW

[chest pa]
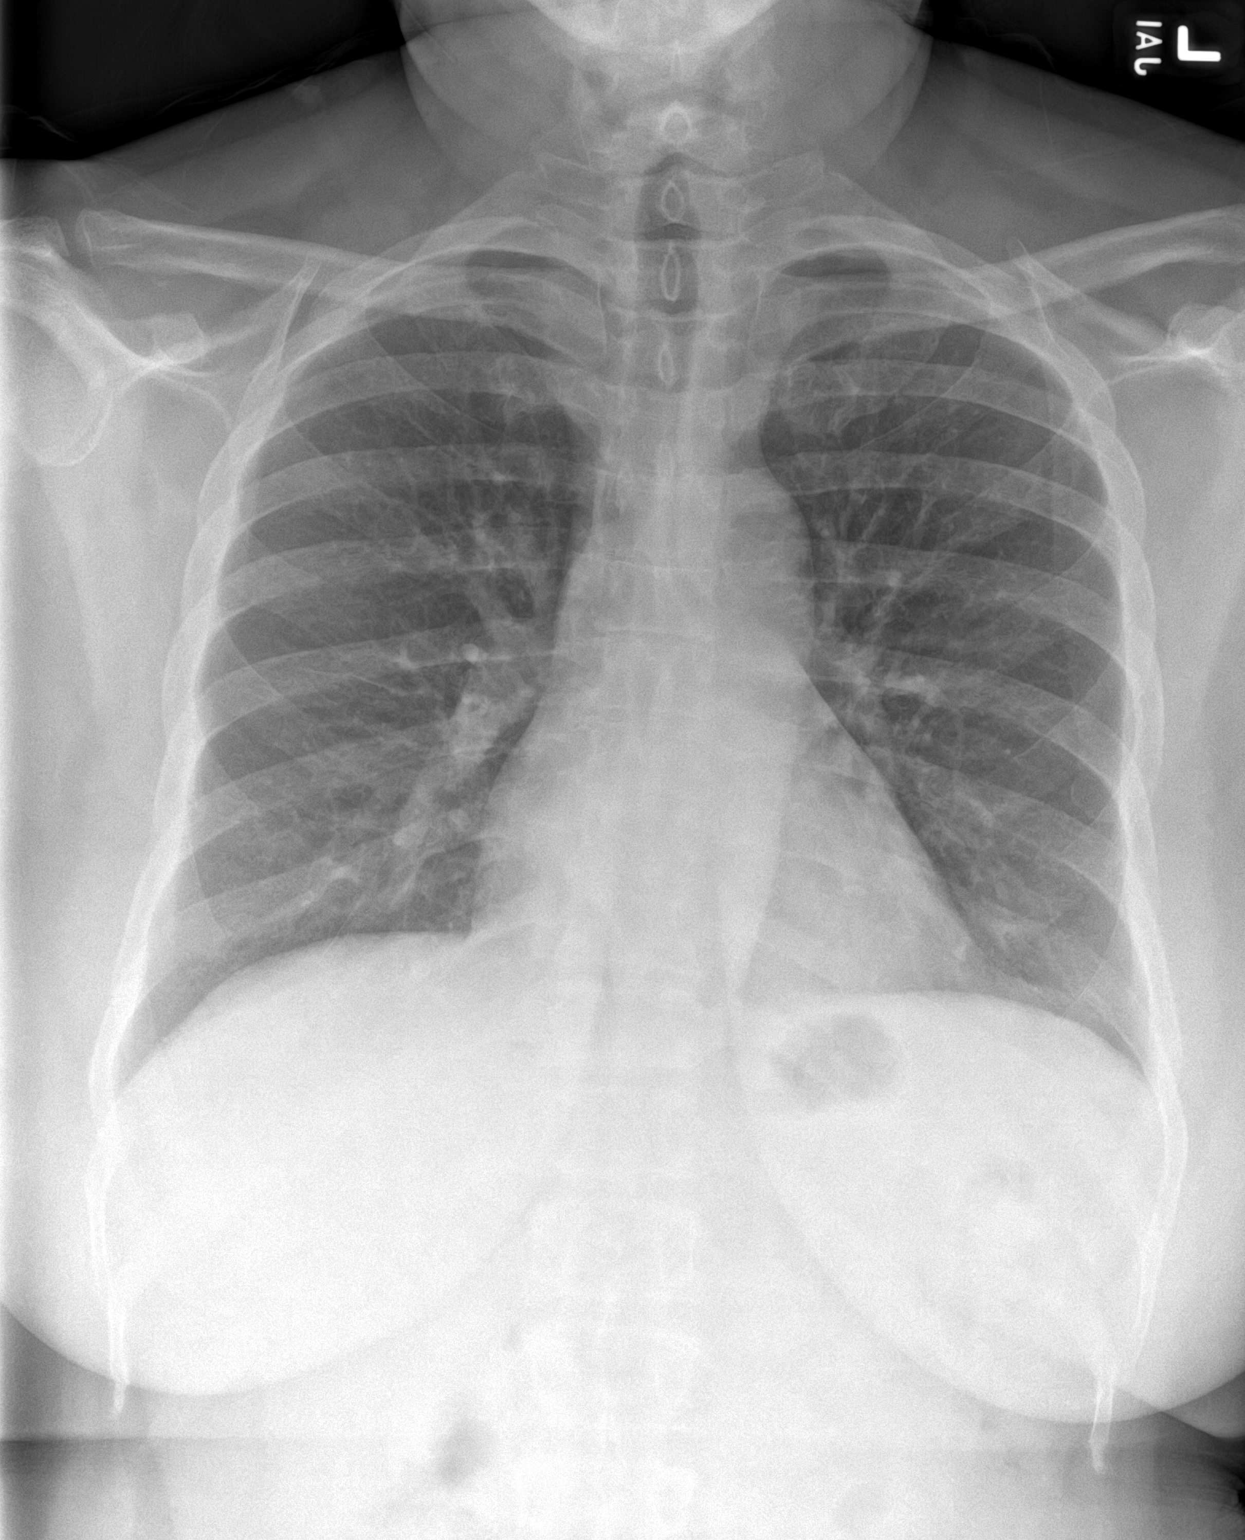

[chest lat]
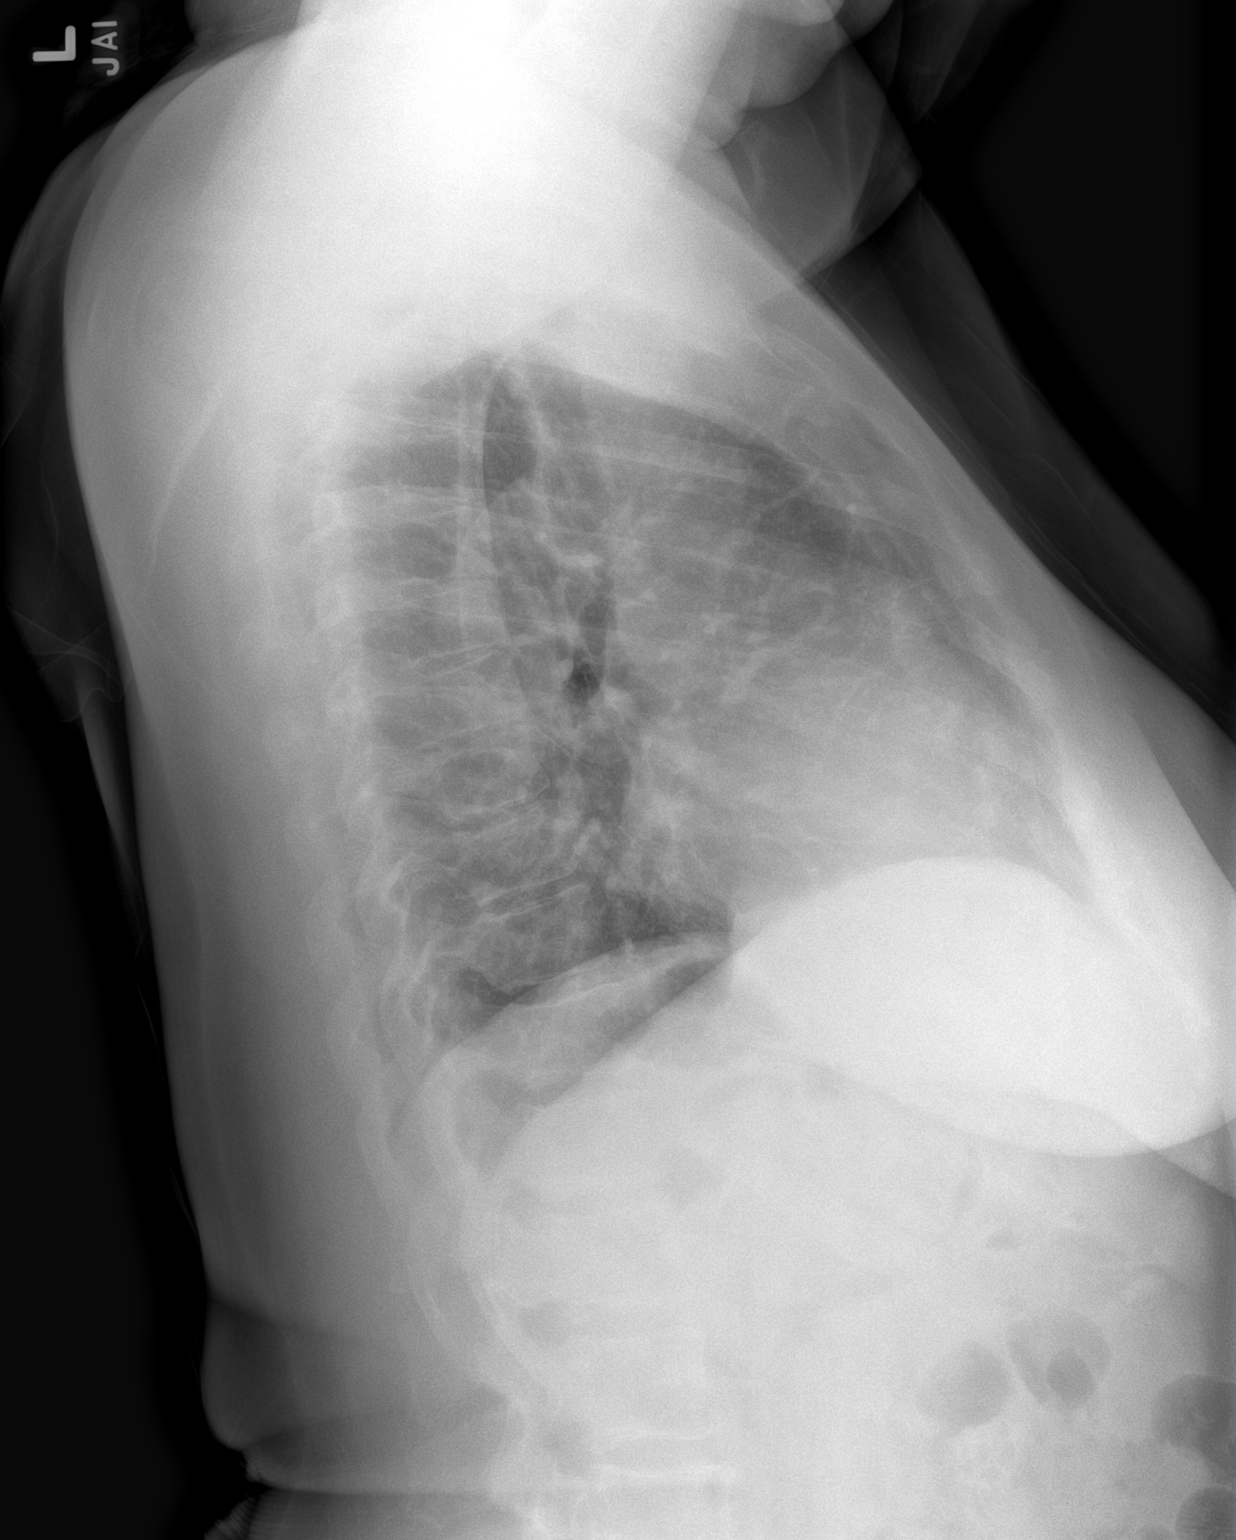

[2 of 2 positions shown; findings below may reference images not displayed]

FINDINGS: The heart size and mediastinal contours are within normal limits.
Both lungs are clear. The visualized skeletal structures are
unremarkable.
IMPRESSION: No active cardiopulmonary disease.

## 2020-10-24 MED ORDER — ACYCLOVIR 400 MG PO TABS: 400.0000 mg | ORAL_TABLET | Freq: Three times a day (TID) | ORAL | 0 refills | Status: DC

## 2020-10-24 MED ORDER — VITAMIN D (ERGOCALCIFEROL) 1.25 MG (50000 UNIT) PO CAPS
50000.0000 [IU] | ORAL_CAPSULE | ORAL | 0 refills | Status: DC
Start: 2020-10-24 — End: 2021-02-09

## 2020-10-24 NOTE — Progress Notes (Signed)
[place] Phone: [phone] Subjective:   Fontaine No, am serving as a scribe for Dr. Hulan Saas.  This visit occurred during the SARS-CoV-2 public health emergency.  Safety protocols were in place, including screening questions prior to the visit, additional usage of staff PPE, and extensive cleaning of exam room while observing appropriate contact time as indicated for disinfecting solutions.   I'm seeing this patient by the request  of:  Marrian Salvage, FNP  CC: Back pain with increased pain follow-up  [id number]  [name] [name] is a 60 y.o. female coming in with complaint of chronic back and pelvic pain. Patient here to discuss MRI results. Patient states that she is in constant pain. Working exacerbates her pain. Pain is over R ribs beneath breast and into the R hip flexor. Pain also in R lumbar spine and glute.   MRI lumbar 10/16/2020 IMPRESSION: 1. Small right foraminal disc protrusion at L5-S1 resulting in mild right neural foramen. 2. No spinal canal stenosis at any level.  MRI pelvis 10/16/2020 IMPRESSION: 1. 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 2. No acute findings or explanation for the patient's symptoms. No evidence of pelvic stress/insufficiency fracture.      Past Medical History:  Diagnosis Date   Abdominal hernia    4 hernias per pt   Abdominal pain    mid abd pain radiating to right side   Anemia    Cancer (HCC)    rectal ca   Change in bowel movement    Colon cancer (HCC)    Complication of anesthesia    cardiac arrest during nasal surgery   Diabetes mellitus without complication ([place])    Headache    History of rectal bleeding    Past Surgical History:  Procedure Laterality Date   CESAREAN SECTION     colon cancer     COLON SURGERY     COLONOSCOPY     LAPAROSCOPIC LYSIS OF ADHESIONS  04/02/2014   Procedure: LAPAROSCOPIC LYSIS  OF ADHESIONS;  Surgeon: Jackolyn Confer, MD;  Location: [facility];  Service: General;;   NOSE SURGERY     POLYPECTOMY     VENTRAL HERNIA REPAIR N/A 04/02/2014   Procedure: LAPAROSCOPIC  LYSIS OF ADHESIONS AND REPAIR OF VENTRAL INCISIONAL HERNIAS WITH MESH;  Surgeon: Jackolyn Confer, MD;  Location: [facility];  Service: General;  Laterality: N/A;   Social History   Socioeconomic History   Marital status: Married    Spouse name: Not on file   Number of children: 3   Years of education: 12   Highest education level: Not on file  Occupational History   Occupation: Customer Service  Tobacco Use   Smoking status: Former    Packs/day: 0.25    Years: 13.00    Pack years: 3.25    Types: Cigarettes    Quit date: 03/14/2003    Years since quitting: 17.6   Smokeless tobacco: Never  Vaping Use   Vaping Use: Some days   Substances: Flavoring   Devices: flavored , non-nicotine  Substance and Sexual Activity   Alcohol use: Yes    Alcohol/week: 0.0 standard drinks    Comment: occasional    Drug use: No   Sexual activity: Not Currently  Other Topics Concern   Not on file  Social History Narrative   Fun: Camping, Cruise    Denies abuse and feels safe at home.  Social Determinants of Health   Financial Resource Strain: Not on file  Food Insecurity: Not on file  Transportation Needs: Not on file  Physical Activity: Not on file  Stress: Not on file  Social Connections: Not on file   Allergies  Allergen Reactions   Epinephrine Anaphylaxis   Lidocaine Anaphylaxis   Lisinopril     itching   Metformin And Related     GI upset   Family History  Problem Relation Age of Onset   Uterine cancer Mother    Colon cancer Maternal Aunt    Breast cancer Paternal Aunt    Colon cancer Maternal Grandmother    Breast cancer Cousin    Breast cancer Cousin    Diabetes Neg Hx    Rectal cancer Neg Hx    Stomach cancer Neg Hx     Current Outpatient Medications (Endocrine & Metabolic):     Dulaglutide (TRULICITY) 1.5 0000000 SOPN, INJECT WEEKLY AS DIRECTED  Current Outpatient Medications (Cardiovascular):    atorvastatin (LIPITOR) 20 MG tablet, TAKE 1 TABLET BY MOUTH EVERY DAY   Current Outpatient Medications (Analgesics):    ibuprofen (ADVIL,MOTRIN) 200 MG tablet, Take 400 mg by mouth every 6 (six) hours as needed for mild pain or moderate pain. Reported on 07/12/2015   Current Outpatient Medications (Other):    acyclovir (ZOVIRAX) 400 MG tablet, Take 1 tablet (400 mg total) by mouth 3 (three) times daily.   Vitamin D, Ergocalciferol, (DRISDOL) 1.25 MG (50000 UNIT) CAPS capsule, Take 1 capsule (50,000 Units total) by mouth every 7 (seven) days.   Biotin 1 MG CAPS, Take by mouth.   glucose blood test strip, Use as instructed   Lancets (ONETOUCH ULTRASOFT) lancets, Use as instructed   Multiple Vitamin (MULTIVITAMIN WITH MINERALS) TABS tablet, Take 1 tablet by mouth daily.   nystatin cream (MYCOSTATIN), Apply 1 application topically 2 (two) times daily.   nystatin-triamcinolone (MYCOLOG II) cream, Apply 1 application topically 2 (two) times daily.   triamcinolone cream (KENALOG) 0.1 %, APPLY TO AFFECTED AREA TWICE A DAY   Reviewed prior external information including notes and imaging from  primary care provider As well as notes that were available from care everywhere and other healthcare systems.  Past medical history, social, surgical and family history all reviewed in electronic medical record.  No pertanent information unless stated regarding to the chief complaint.   Review of Systems:  No headache, visual changes, nausea, vomiting, diarrhea, constipation, dizziness, , skin rash, fevers, chills, night sweats, weight loss, swollen lymph nodes,  joint swelling, chest pain, shortness of breath, mood changes. POSITIVE muscle aches, body aches, abdominal pain  Objective  Blood pressure (!) 144/84, pulse 80, height 5' (1.524 m), weight 188 lb (85.3 kg), SpO2 98 %.    General: No apparent distress alert and oriented x3 mood and affect normal, dressed appropriately.  HEENT: Pupils equal, extraocular movements intact  Respiratory: Patient's speak in full sentences and does not appear short of breath  Cardiovascular: No lower extremity edema, non tender, no erythema  Gait normal with good balance and coordination.  MSK:  Patient is very uncomfortable with sitting.  He does have pain in the right upper quadrant area.  Even pain to light palpation.  No masses appreciated.  No worsening pain with Valsalva. Low back exam does have loss of lordosis.  Poor core strength.  Mild positive straight leg test.  Tightness of the hamstring.  Pain in the paraspinal musculature lumbar spine around L5-S1 most severity on  the right side.    Impression and Recommendations:     The above documentation has been reviewed and is accurate and complete Lyndal Pulley, DO

## 2020-10-24 NOTE — Assessment & Plan Note (Signed)
Patient has had considerable work-up for this previously including a CT abdomen done previously.  Gallbladder was unremarkable and this was independently reviewed by me today.  In addition to this patient has had work-up including a colonoscopy that was unremarkable.  Patient did have some associated with nausea and vomiting and has had previously had hernia.  Patient knows not having any bulging at this time.  Initially patient did have darkening of the skin in the area and was treated for potential infectious etiology.  Patient's pain is out of proportion and could be potentially early shingles as well that seems to be smoldering at this time.  Discussed the potential acyclovir and see if patient does respond.  We will get a chest x-ray to make sure there is anything supradiaphragmatic that could be contributing to this discomfort and pain as well.  This is difficult because patient even had pain while she is seated.  Discussed with patient that we could write potential notes for any exacerbation for work as well as avoiding sitting for long duration of time with maybe some mild increase in breaks.  Patient will consider it.  Follow-up with me again 4 to 8 weeks total time reviewing patient's chart and talking to patient today 35 minutes

## 2020-10-24 NOTE — Assessment & Plan Note (Signed)
Vitamin D deficiency.  Start once weekly prescription dose of vitamin D and see how patient responds recheck in 12 weeks could be contributing to some muscle endurance fatigue.

## 2020-10-24 NOTE — Patient Instructions (Addendum)
Vit D once weekly Chest xray Epidural L5/S1 Acyclvir for next 5 days See me again in 6 weeks

## 2020-10-24 NOTE — Assessment & Plan Note (Signed)
L5/S1 on right side.  Noted on MRI.  Very small disc protrusion that could be contributing to at least the hip pain as well as low back pain.  If we can make this feel better then maybe patient can stand more regularly and not have as much discomfort.  Will try epidural and see if that helps pain.  Likely it may make some difference.  Other than this patient's low back seems to be fairly unremarkable.  Patient should do relatively well.  Has had multiple surgeries of the abdomen previously that we will monitor.  Do not feel any type of hernia.  Patient initially

## 2020-10-25 ENCOUNTER — Telehealth: Payer: Self-pay | Admitting: Family Medicine

## 2020-10-25 ENCOUNTER — Encounter: Payer: Self-pay | Admitting: Family Medicine

## 2020-10-25 NOTE — Telephone Encounter (Signed)
Called patient and she said she will not use medication.

## 2020-10-25 NOTE — Telephone Encounter (Signed)
Patient called with questions regarding the prescription that was sent in for Acyclovir. She said that the cream she was prescribed in the past was helping so she did not see a need to take this medication and was concerned about the side effects.  She wanted to know the main reason it was prescribed and if she really needs it at this point?

## 2020-10-27 ENCOUNTER — Encounter: Payer: No Typology Code available for payment source | Admitting: Physical Therapy

## 2020-11-03 ENCOUNTER — Encounter: Payer: No Typology Code available for payment source | Admitting: Physical Therapy

## 2020-11-04 ENCOUNTER — Other Ambulatory Visit: Payer: Self-pay

## 2020-11-04 ENCOUNTER — Ambulatory Visit
Admission: RE | Admit: 2020-11-04 | Discharge: 2020-11-04 | Disposition: A | Discharge status: Home / Self Care | Payer: No Typology Code available for payment source | Source: Ambulatory Visit | Attending: Family Medicine | Admitting: Family Medicine

## 2020-11-04 DIAGNOSIS — M545 Low back pain, unspecified: Secondary | ICD-10-CM

## 2020-11-04 IMAGING — XA Imaging study
1 series · 1 of 1 positions shown · non-contrast
Comparison: none

CLINICAL DATA: Lumbosacral spondylosis without myelopathy. Small
right foraminal disc protrusion at L5-S1 resulting in mild
right neural foramen.
No spinal canal stenosis at any level.

[Series 1: ortho adipose · 1 of 1 slices shown]
[im 1/1]
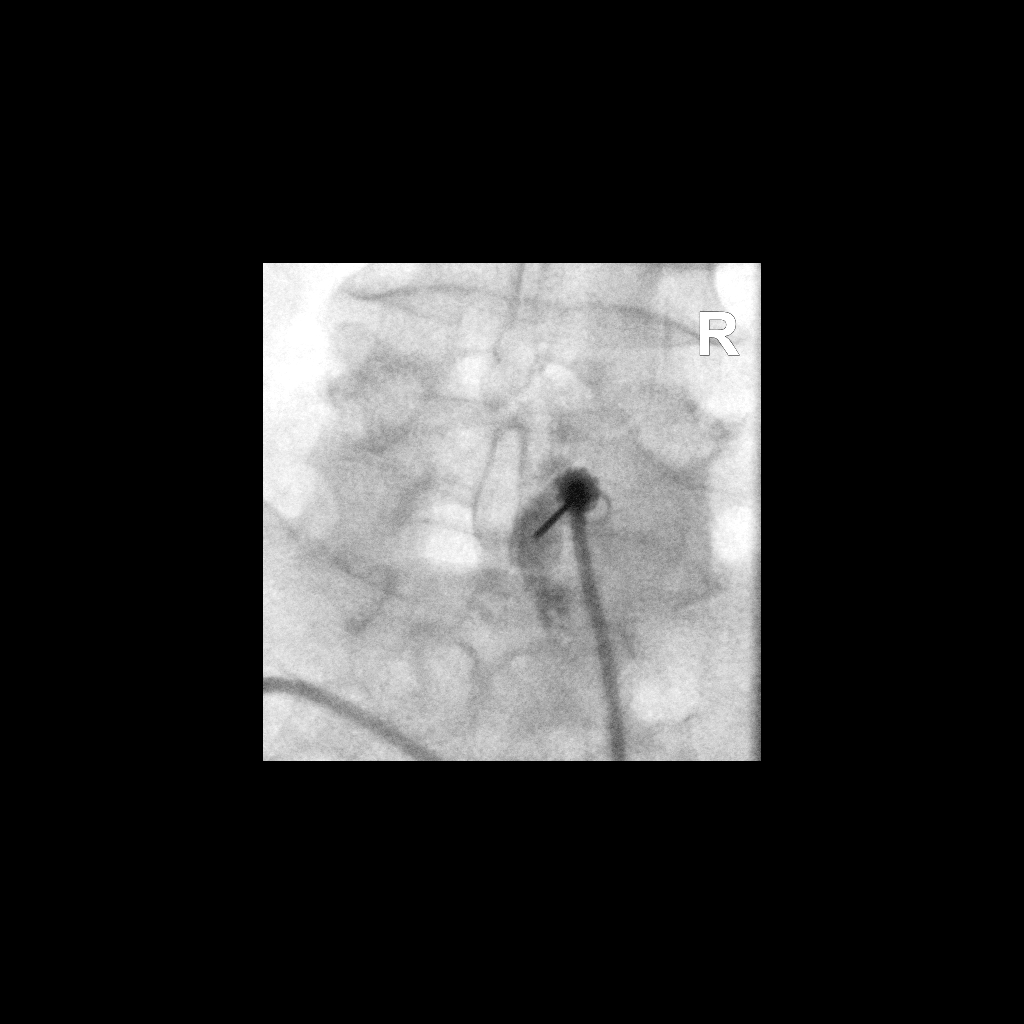

[1 of 1 positions shown; findings below may reference images not displayed]

EXAM:
LUMBAR EPIDURAL INJECTION:

DIAGNOSTIC EPIDURAL INJECTION:

THERAPEUTIC EPIDURAL INJECTION:

PROCEDURE:
The procedure, risks, benefits, and alternatives were explained to
the patient. Questions regarding the procedure were encouraged and
answered. The patient understands and consents to the procedure.

An interlaminar approach was performed on right at L5-S1. The
overlying skin was cleansed and anesthetized. A 20 gauge epidural
needle was advanced using loss-of-resistance technique.

Injection of Isovue-M 200 shows a good epidural pattern with spread
above and below the level of needle placement, primarily on the
right. No vascular opacification is seen.

80mg of Depo-Medrol mixed with 2ml lidocaine 1% were instilled. The
procedure was well-tolerated, and the patient was discharged thirty
minutes following the injection in good condition.

FLUOROSCOPY TIME:  20 seconds; 18 [1V] DAP

COMPLICATIONS:
None immediate
IMPRESSION: Technically successful epidural injection on the right at L5-S1.

## 2020-11-04 MED ORDER — METHYLPREDNISOLONE ACETATE 40 MG/ML INJ SUSP (RADIOLOG
80.0000 mg | Freq: Once | INTRAMUSCULAR | Status: AC
  Administered 2020-11-04: 80 mg via EPIDURAL

## 2020-11-04 MED ORDER — IOPAMIDOL (ISOVUE-M 200) INJECTION 41%
1.0000 mL | Freq: Once | INTRAMUSCULAR | Status: AC
  Administered 2020-11-04: 1 mL via EPIDURAL

## 2020-11-04 NOTE — Discharge Instructions (Signed)

## 2020-11-10 ENCOUNTER — Encounter: Payer: No Typology Code available for payment source | Admitting: Physical Therapy

## 2020-11-14 ENCOUNTER — Encounter: Payer: Self-pay | Admitting: Gastroenterology

## 2020-11-17 ENCOUNTER — Encounter: Payer: No Typology Code available for payment source | Admitting: Physical Therapy

## 2020-11-24 ENCOUNTER — Encounter: Payer: No Typology Code available for payment source | Admitting: Physical Therapy

## 2020-12-01 ENCOUNTER — Encounter: Payer: No Typology Code available for payment source | Admitting: Physical Therapy

## 2020-12-06 NOTE — Progress Notes (Signed)
[place] Phone: [phone] Subjective:   [name] [name], am serving as a scribe for Dr. Hulan Saas. This visit occurred during the SARS-CoV-2 public health emergency.  Safety protocols were in place, including screening questions prior to the visit, additional usage of staff PPE, and extensive cleaning of exam room while observing appropriate contact time as indicated for disinfecting solutions.   I'm seeing this patient by the request  of:  Marrian Salvage, FNP  CC: Back pain and abdominal pain follow-up  [id number]  10/24/2020 Patient has had considerable work-up for this previously including a CT abdomen done previously.  Gallbladder was unremarkable and this was independently reviewed by me today.  In addition to this patient has had work-up including a colonoscopy that was unremarkable.  Patient did have some associated with nausea and vomiting and has had previously had hernia.  Patient knows not having any bulging at this time.  Initially patient did have darkening of the skin in the area and was treated for potential infectious etiology.  Patient's pain is out of proportion and could be potentially early shingles as well that seems to be smoldering at this time.  Discussed the potential acyclovir and see if patient does respond.  We will get a chest x-ray to make sure there is anything supradiaphragmatic that could be contributing to this discomfort and pain as well.  This is difficult because patient even had pain while she is seated.  Discussed with patient that we could write potential notes for any exacerbation for work as well as avoiding sitting for long duration of time with maybe some mild increase in breaks.  Patient will consider it.  Follow-up with me again 4 to 8 weeks total time reviewing patient's chart and talking to patient today 35 minutes L5/S1 on right side.  Noted on MRI.  Very small  disc protrusion that could be contributing to at least the hip pain as well as low back pain.  If we can make this feel better then maybe patient can stand more regularly and not have as much discomfort.   Will try epidural and see if that helps pain.  Likely it may make some difference.  Other than this patient's low back seems to be fairly unremarkable.  Patient should do relatively well.  Has had multiple surgeries of the abdomen previously that we will monitor.  Do not feel any type of hernia.  Patient initially  Updated 12/07/2020 [name] [name] is a 59 y.o. female coming in with complaint of rib and low back pain. Patient states that she is [name] longer having pain since the epidural. Patient is able to be active as well.  Patient is very happy with the results at this point.  He knows that this is what was causing the stomach pain as well with it being nearly completely gone as well.  Xray (-) chest x-ray  Patient since last visit and did undergo a right-sided epidural at L5-S1 on September 2    Past Medical History:  Diagnosis Date   Abdominal hernia    4 hernias per pt   Abdominal pain    mid abd pain radiating to right side   Anemia    Cancer ([place])    rectal ca   Change in bowel movement    Colon cancer (HCC)    Complication of anesthesia    cardiac arrest during nasal surgery   Diabetes mellitus without  complication ([place])    Headache    History of rectal bleeding    Past Surgical History:  Procedure Laterality Date   CESAREAN SECTION     colon cancer     COLON SURGERY     COLONOSCOPY     LAPAROSCOPIC LYSIS OF ADHESIONS  04/02/2014   Procedure: LAPAROSCOPIC LYSIS OF ADHESIONS;  Surgeon: Jackolyn Confer, MD;  Location: [facility];  Service: General;;   NOSE SURGERY     POLYPECTOMY     VENTRAL HERNIA REPAIR N/A 04/02/2014   Procedure: LAPAROSCOPIC  LYSIS OF ADHESIONS AND REPAIR OF VENTRAL INCISIONAL HERNIAS WITH MESH;  Surgeon: Jackolyn Confer, MD;  Location: [facility];   Service: General;  Laterality: N/A;   Social History   Socioeconomic History   Marital status: Married    Spouse name: Not on file   Number of children: 3   Years of education: 12   Highest education level: Not on file  Occupational History   Occupation: Customer Service  Tobacco Use   Smoking status: Former    Packs/day: 0.25    Years: 13.00    Pack years: 3.25    Types: Cigarettes    Quit date: 03/14/2003    Years since quitting: 17.7   Smokeless tobacco: Never  Vaping Use   Vaping Use: Some days   Substances: Flavoring   Devices: flavored , non-nicotine  Substance and Sexual Activity   Alcohol use: Yes    Alcohol/week: 0.0 standard drinks    Comment: occasional    Drug use: [name]   Sexual activity: Not Currently  Other Topics Concern   Not on file  Social History Narrative   Fun: Camping, Cruise    Denies abuse and feels safe at home.    Social Determinants of Health   Financial Resource Strain: Not on file  Food Insecurity: Not on file  Transportation Needs: Not on file  Physical Activity: Not on file  Stress: Not on file  Social Connections: Not on file   Allergies  Allergen Reactions   Epinephrine Anaphylaxis   Lidocaine Anaphylaxis   Lisinopril     itching   Metformin And Related     GI upset   Family History  Problem Relation Age of Onset   Uterine cancer Mother    Colon cancer Maternal Aunt    Breast cancer Paternal Aunt    Colon cancer Maternal Grandmother    Breast cancer Cousin    Breast cancer Cousin    Diabetes Neg Hx    Rectal cancer Neg Hx    Stomach cancer Neg Hx     Current Outpatient Medications (Endocrine & Metabolic):    Dulaglutide (TRULICITY) 1.5 IO/0.3TD SOPN, INJECT WEEKLY AS DIRECTED  Current Outpatient Medications (Cardiovascular):    atorvastatin (LIPITOR) 20 MG tablet, TAKE 1 TABLET BY MOUTH EVERY DAY   Current Outpatient Medications (Analgesics):    ibuprofen (ADVIL,MOTRIN) 200 MG tablet, Take 400 mg by mouth every 6  (six) hours as needed for mild pain or moderate pain. Reported on 07/12/2015   Current Outpatient Medications (Other):    acyclovir (ZOVIRAX) 400 MG tablet, Take 1 tablet (400 mg total) by mouth 3 (three) times daily.   Biotin 1 MG CAPS, Take by mouth.   glucose blood test strip, Use as instructed   Lancets (ONETOUCH ULTRASOFT) lancets, Use as instructed   Multiple Vitamin (MULTIVITAMIN WITH MINERALS) TABS tablet, Take 1 tablet by mouth daily.   nystatin cream (MYCOSTATIN), Apply 1 application topically 2 (  two) times daily.   nystatin-triamcinolone (MYCOLOG II) cream, Apply 1 application topically 2 (two) times daily.   triamcinolone cream (KENALOG) 0.1 %, APPLY TO AFFECTED AREA TWICE A DAY   Vitamin D, Ergocalciferol, (DRISDOL) 1.25 MG (50000 UNIT) CAPS capsule, Take 1 capsule (50,000 Units total) by mouth every 7 (seven) days.   Reviewed prior external information including notes and imaging from  primary care provider As well as notes that were available from care everywhere and other healthcare systems.  Past medical history, social, surgical and family history all reviewed in electronic medical record.  [name] pertanent information unless stated regarding to the chief complaint.   Review of Systems:  [name] headache, visual changes, nausea, vomiting, diarrhea, constipation, dizziness, abdominal pain, skin rash, fevers, chills, night sweats, weight loss, swollen lymph nodes, body aches, joint swelling, chest pain, shortness of breath, mood changes. POSITIVE muscle aches  Objective  Blood pressure 110/80, pulse 86, height 5' (1.524 m), weight 188 lb (85.3 kg), SpO2 98 %.   General: [name] apparent distress alert and oriented x3 mood and affect normal, dressed appropriately.  HEENT: Pupils equal, extraocular movements intact  Respiratory: Patient's speak in full sentences and does not appear short of breath  Cardiovascular: [name] lower extremity edema, non tender, [name] erythema  Gait normal with good  balance and coordination.  MSK: Patient is sitting much more comfortable in the chair.  Patient is even doing a little dance.  Full range of motion of all the extremities at the moment.  Able to get out of his seat without any significant difficulty.   Impression and Recommendations:    The above documentation has been reviewed and is accurate and complete Lyndal Pulley, DO

## 2020-12-07 ENCOUNTER — Encounter: Payer: Self-pay | Admitting: Family Medicine

## 2020-12-07 ENCOUNTER — Ambulatory Visit: Payer: No Typology Code available for payment source | Admitting: Family Medicine

## 2020-12-07 ENCOUNTER — Other Ambulatory Visit: Payer: Self-pay

## 2020-12-07 DIAGNOSIS — G8929 Other chronic pain: Secondary | ICD-10-CM | POA: Diagnosis not present

## 2020-12-07 DIAGNOSIS — M545 Low back pain, unspecified: Secondary | ICD-10-CM

## 2020-12-07 NOTE — Patient Instructions (Signed)
Do prescribed exercises at least 2x a week for maintenance Pain gets worse write [place] in MyChart See me when you need me

## 2020-12-07 NOTE — Assessment & Plan Note (Signed)
Patient is responding extremely well to the epidural at the L5-S1.  Full improvement of the pain at the moment.  Not having any difficulty and having no side pain.  At this point patient can increase activity as tolerated and given some home exercises.  Patient does not want to go to formal physical therapy and I think with patient feeling so much better I think this will be good.  Will follow up with me again only on an as-needed basis but we can repeat the epidural if necessary.

## 2020-12-08 ENCOUNTER — Encounter: Payer: No Typology Code available for payment source | Admitting: Physical Therapy

## 2020-12-15 ENCOUNTER — Encounter: Payer: No Typology Code available for payment source | Admitting: Physical Therapy

## 2021-01-01 ENCOUNTER — Other Ambulatory Visit: Payer: Self-pay | Admitting: Family

## 2021-01-13 ENCOUNTER — Telehealth: Payer: Self-pay

## 2021-01-13 NOTE — Telephone Encounter (Signed)
I have received a letter from [employer] They have drafted a letter to the provider to sign to start pts disability claim. I have placed the letter into provider folder for review.

## 2021-01-24 NOTE — Telephone Encounter (Addendum)
I called the attorney listed on the disability claim letter. I explained my reservation regarding signing the letter and asked if there was another way I could help support the patient's request. They indicated that they would take another approach to her disability request and thanked me for time.  She noted they understood my reservations since I had only seen patient in May 2022 and I did not have documentation that we had discussed her staying out of work for extended period of time during summer of 2022.

## 2021-01-26 ENCOUNTER — Other Ambulatory Visit: Payer: Self-pay | Admitting: Family

## 2021-02-09 ENCOUNTER — Other Ambulatory Visit: Payer: Self-pay | Admitting: Family Medicine

## 2021-02-10 ENCOUNTER — Other Ambulatory Visit: Payer: Self-pay | Admitting: Family

## 2021-02-10 DIAGNOSIS — Z1231 Encounter for screening mammogram for malignant neoplasm of breast: Secondary | ICD-10-CM

## 2021-02-12 ENCOUNTER — Other Ambulatory Visit: Payer: Self-pay | Admitting: Family

## 2021-03-03 ENCOUNTER — Telehealth: Payer: Self-pay | Admitting: Family Medicine

## 2021-03-03 NOTE — Telephone Encounter (Signed)
Patient called stating that she has forms from [employer] that she wants Dr. Tamala Julian to look at/over to see if everything is correct or if anything needs to be added. She asked that it be sent back to her and Lucia Estelle, not [employer].  Form has been printed and placed in Dr [employer] box for review.

## 2021-03-15 ENCOUNTER — Ambulatory Visit
Admission: RE | Admit: 2021-03-15 | Discharge: 2021-03-15 | Disposition: A | Discharge status: Home / Self Care | Payer: No Typology Code available for payment source | Source: Ambulatory Visit | Attending: Family | Admitting: Family

## 2021-03-15 DIAGNOSIS — Z1231 Encounter for screening mammogram for malignant neoplasm of breast: Secondary | ICD-10-CM

## 2021-03-15 IMAGING — MG MM DIGITAL SCREENING BILAT W/ TOMO AND CAD
8 series · 8 of 24 positions shown · non-contrast
Comparison: Previous exam(s).

CLINICAL DATA: Screening.

EXAM:
DIGITAL SCREENING BILATERAL MAMMOGRAM WITH TOMOSYNTHESIS AND CAD
TECHNIQUE: Bilateral screening digital craniocaudal and mediolateral oblique
mammograms were obtained. Bilateral screening digital breast
tomosynthesis was performed. The images were evaluated with
computer-aided detection.

[L CC synth-2D]
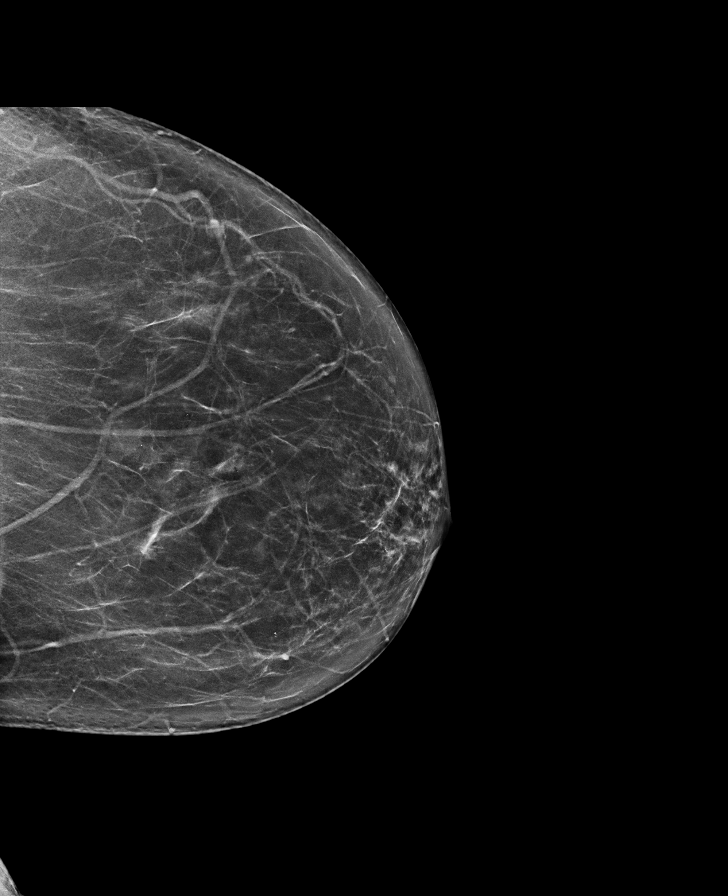

[L MLO synth-2D]
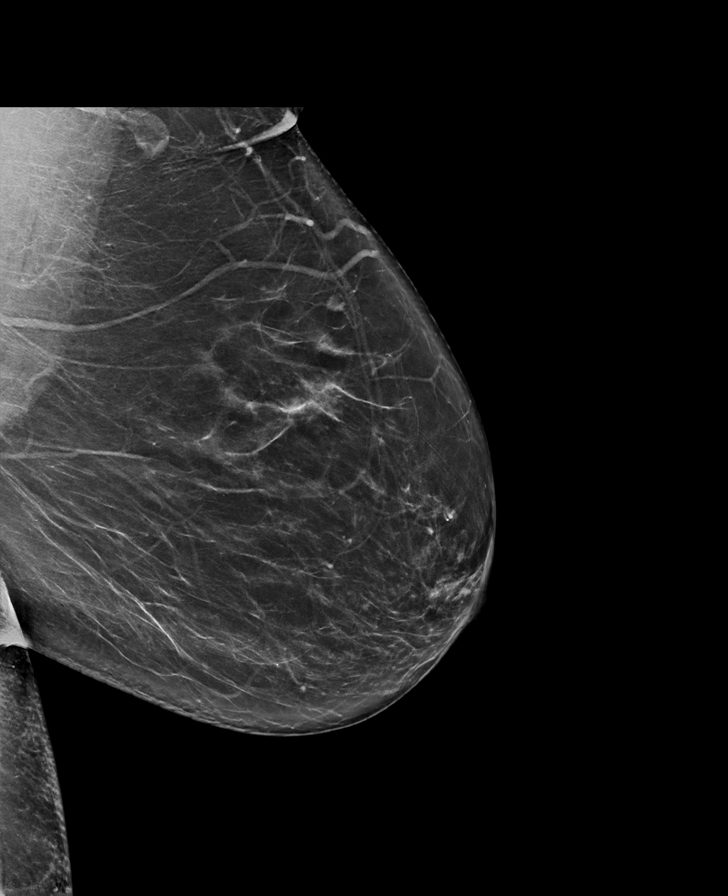

[R CC synth-2D]
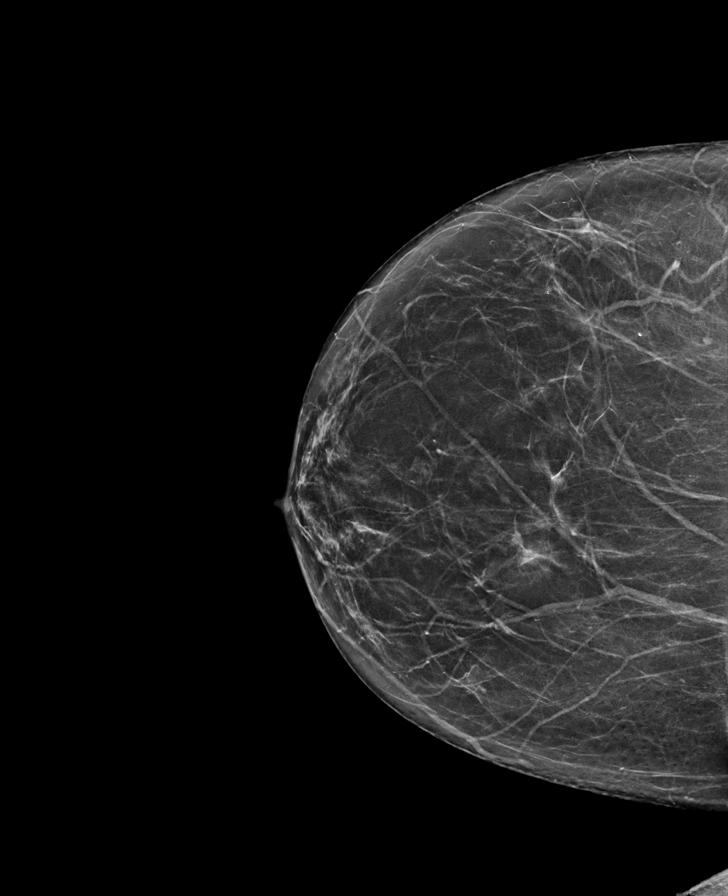

[R MLO synth-2D]
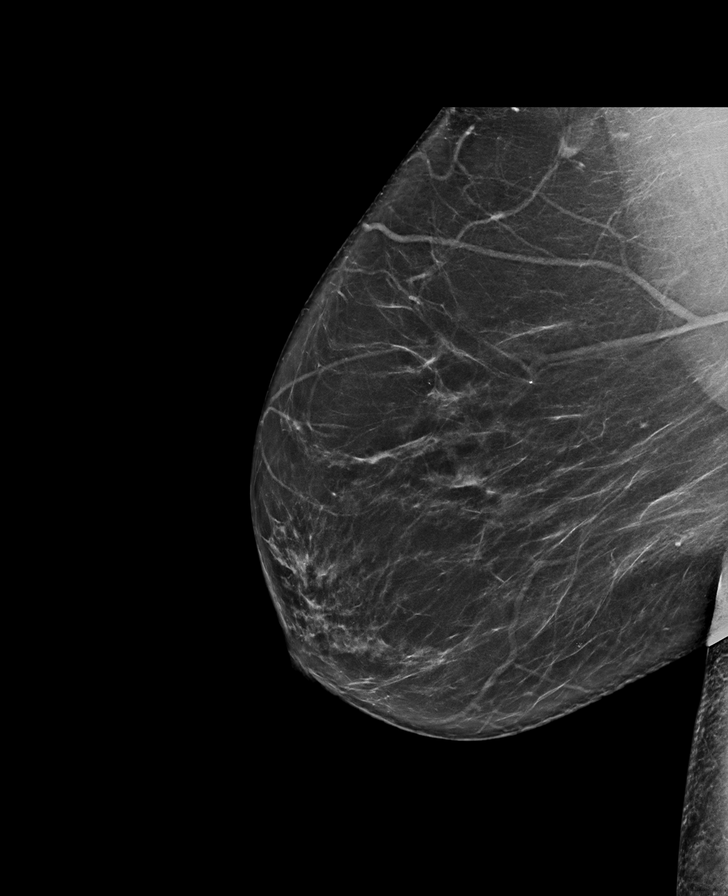

[R CC tomo · tomo slice 33/65.0]
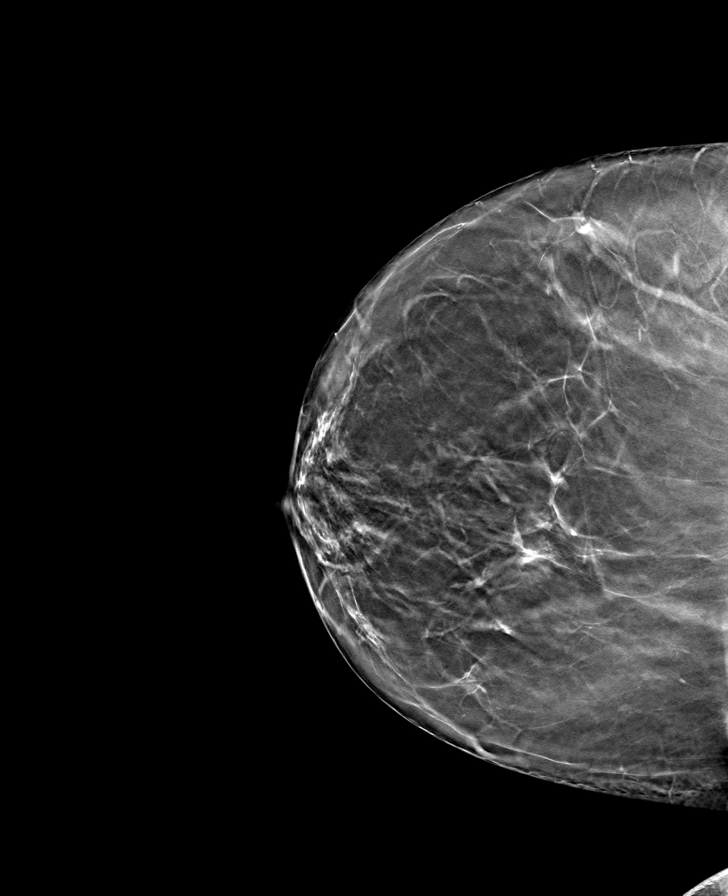

[R MLO tomo · tomo slice 40/79.0]
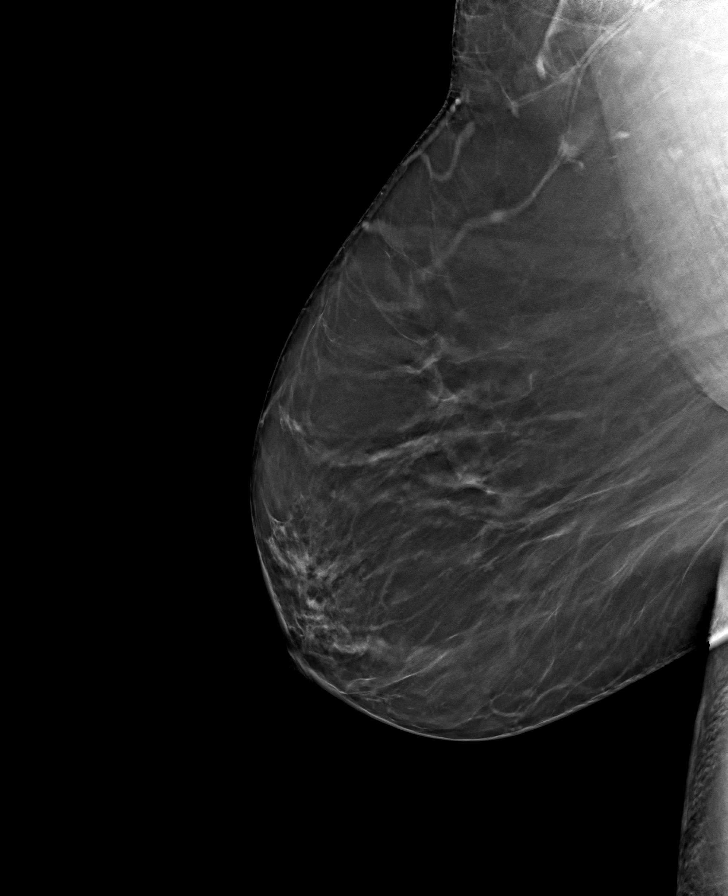

[L MLO tomo · tomo slice 41/80.0]
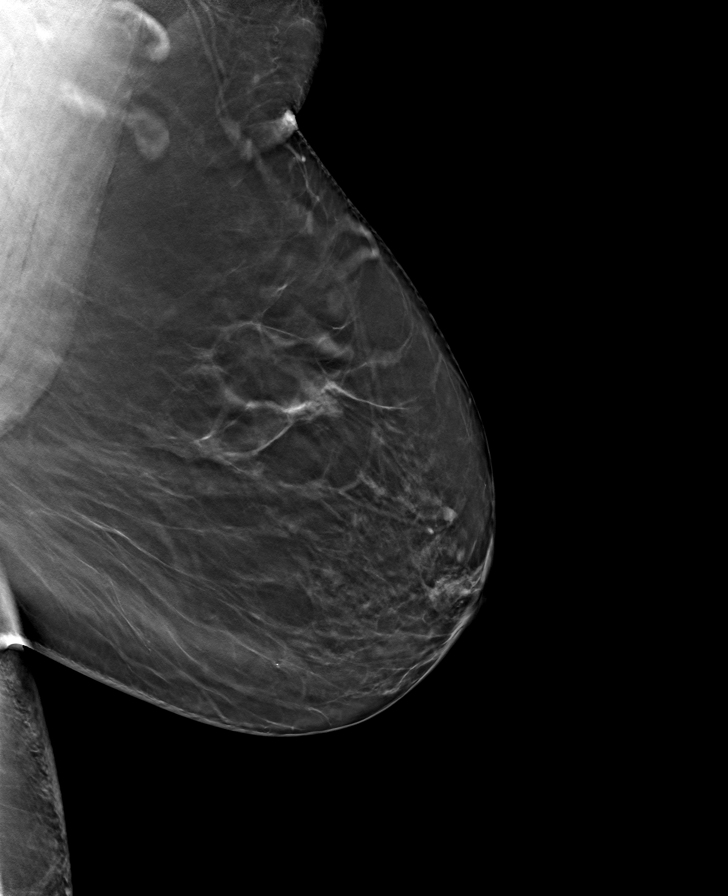

[L CC tomo · tomo slice 37/73.0]
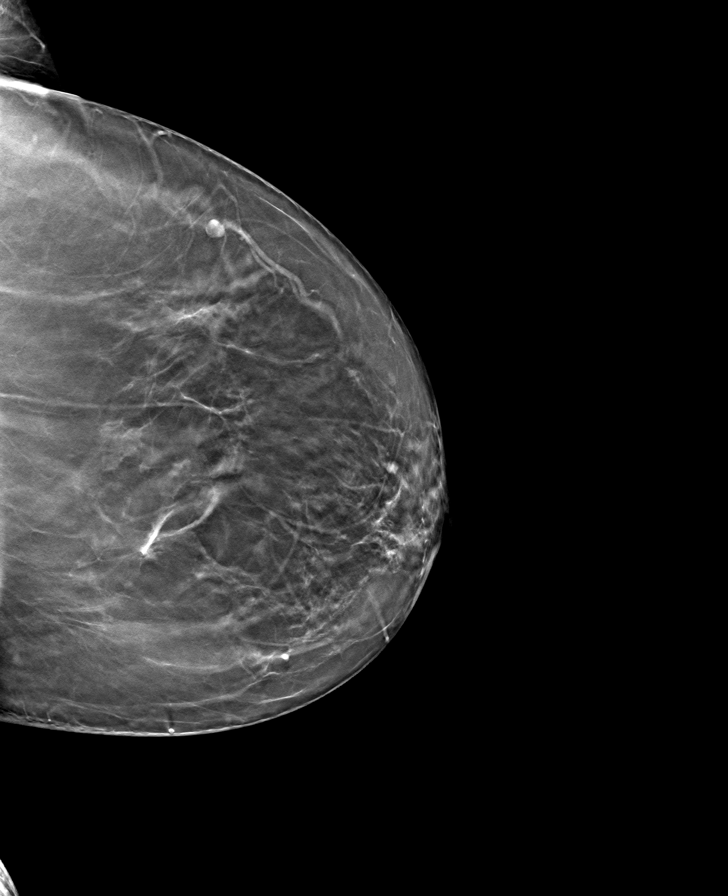

[8 of 24 positions shown; findings below may reference images not displayed]

ACR Breast Density Category c: The breast tissue is heterogeneously
dense, which may obscure small masses.
FINDINGS: There are no findings suspicious for malignancy.
IMPRESSION: No mammographic evidence of malignancy. A result letter of this
screening mammogram will be mailed directly to the patient.

RECOMMENDATION:
Screening mammogram in one year. (Code:[V2])

BI-RADS CATEGORY  1: Negative.

## 2021-03-27 ENCOUNTER — Other Ambulatory Visit: Payer: Self-pay | Admitting: Family

## 2021-03-27 MED ORDER — ATORVASTATIN CALCIUM 20 MG PO TABS: 20.0000 mg | ORAL_TABLET | Freq: Every day | ORAL | 0 refills | Status: DC

## 2021-04-06 ENCOUNTER — Encounter: Payer: Self-pay | Admitting: Family

## 2021-04-06 ENCOUNTER — Other Ambulatory Visit: Payer: Self-pay | Admitting: Family

## 2021-04-06 ENCOUNTER — Telehealth: Payer: Self-pay | Admitting: Family

## 2021-04-06 NOTE — Telephone Encounter (Signed)
Patient calling in  Wants to schedule TOC appt w/ Dr. Pearline Cables   Patient is not willing to travel to [facility] location   Please let me know if okay to schedule

## 2021-05-13 ENCOUNTER — Telehealth: Payer: Self-pay | Admitting: Family Medicine

## 2021-05-13 NOTE — Telephone Encounter (Signed)
[name] [name] on call ? ?Has not seen PCP in 2 years in office. Had a video visit about a year ago but never came by for labs.  ? ?On trulicity but blood sugar over 450 and having urinary frequency. Feels she is watching her diet.  ? ?Did have a blood sugar with sports medicine last July that was not significantly elevated ? ?I worry about possible trigger including infectious and recommended eval in urgent care or emergency room- would not be able to fully walk her through insulin dosing and titration (which is what I think she will need). She needs to be worked in soon to her PCPs office (appears to be transitioning so will send to former and upcoming PCP. I also think she likely needs some labs to help determine next steps with last labs July 2022 through sports medicine.  ?

## 2021-05-14 ENCOUNTER — Encounter (HOSPITAL_COMMUNITY): Payer: Self-pay

## 2021-05-14 ENCOUNTER — Telehealth (HOSPITAL_COMMUNITY): Payer: Self-pay | Admitting: Emergency Medicine

## 2021-05-14 ENCOUNTER — Emergency Department (HOSPITAL_COMMUNITY)
Admission: EM | Admit: 2021-05-14 | Discharge: 2021-05-14 | Disposition: A | Discharge status: Home / Self Care | Payer: No Typology Code available for payment source | Attending: Emergency Medicine | Admitting: Emergency Medicine

## 2021-05-14 ENCOUNTER — Other Ambulatory Visit: Payer: Self-pay

## 2021-05-14 DIAGNOSIS — Z794 Long term (current) use of insulin: Secondary | ICD-10-CM | POA: Insufficient documentation

## 2021-05-14 DIAGNOSIS — E1165 Type 2 diabetes mellitus with hyperglycemia: Secondary | ICD-10-CM | POA: Diagnosis not present

## 2021-05-14 DIAGNOSIS — R739 Hyperglycemia, unspecified: Secondary | ICD-10-CM

## 2021-05-14 DIAGNOSIS — N3000 Acute cystitis without hematuria: Secondary | ICD-10-CM | POA: Insufficient documentation

## 2021-05-14 DIAGNOSIS — R1030 Lower abdominal pain, unspecified: Secondary | ICD-10-CM | POA: Diagnosis present

## 2021-05-14 LAB — CBC WITH DIFFERENTIAL/PLATELET
Abs Immature Granulocytes: 0.01 10*3/uL (ref 0.00–0.07)
Basophils Absolute: 0 10*3/uL (ref 0.0–0.1)
Basophils Relative: 0 %
Eosinophils Absolute: 0.1 10*3/uL (ref 0.0–0.5)
Eosinophils Relative: 1 %
HCT: 39.9 % (ref 36.0–46.0)
Hemoglobin: 12.1 g/dL (ref 12.0–15.0)
Immature Granulocytes: 0 %
Lymphocytes Relative: 32 %
Lymphs Abs: 1.3 10*3/uL (ref 0.7–4.0)
MCH: 21.8 pg — ABNORMAL LOW (ref 26.0–34.0)
MCHC: 30.3 g/dL (ref 30.0–36.0)
MCV: 72 fL — ABNORMAL LOW (ref 80.0–100.0)
Monocytes Absolute: 0.4 10*3/uL (ref 0.1–1.0)
Monocytes Relative: 9 %
Neutro Abs: 2.4 10*3/uL (ref 1.7–7.7)
Neutrophils Relative %: 58 %
Platelets: 211 10*3/uL (ref 150–400)
RBC: 5.54 MIL/uL — ABNORMAL HIGH (ref 3.87–5.11)
RDW: 14.2 % (ref 11.5–15.5)
WBC: 4.1 10*3/uL (ref 4.0–10.5)
nRBC: 0 % (ref 0.0–0.2)

## 2021-05-14 LAB — URINALYSIS, ROUTINE W REFLEX MICROSCOPIC
Bilirubin Urine: NEGATIVE
Glucose, UA: >=500 mg/dL — AB
Ketones, ur: 5 mg/dL — AB
Nitrite: NEGATIVE
Protein, ur: NEGATIVE mg/dL
Specific Gravity, Urine: 1.031 — ABNORMAL HIGH (ref 1.005–1.030)
pH: 6 (ref 5.0–8.0)

## 2021-05-14 LAB — BASIC METABOLIC PANEL
Anion gap: 8 (ref 5–15)
BUN: 10 mg/dL (ref 8–23)
CO2: 26 mmol/L (ref 22–32)
Calcium: 8.7 mg/dL — ABNORMAL LOW (ref 8.9–10.3)
Chloride: 103 mmol/L (ref 98–111)
Creatinine, Ser: 0.96 mg/dL (ref 0.44–1.00)
GFR, Estimated: >60 mL/min (ref >60)
Glucose, Bld: 363 mg/dL — ABNORMAL HIGH (ref 70–99)
Potassium: 3.9 mmol/L (ref 3.5–5.1)
Sodium: 137 mmol/L (ref 135–145)

## 2021-05-14 LAB — CBG MONITORING, ED: Glucose-Capillary: 384 mg/dL — ABNORMAL HIGH (ref 70–99)

## 2021-05-14 MED ORDER — PHENAZOPYRIDINE HCL 200 MG PO TABS: 200.0000 mg | ORAL_TABLET | Freq: Three times a day (TID) | ORAL | 0 refills | Status: DC

## 2021-05-14 MED ORDER — SODIUM CHLORIDE 0.9 % IV BOLUS
1000.0000 mL | Freq: Once | INTRAVENOUS | Status: AC
  Administered 2021-05-14: 1000 mL via INTRAVENOUS

## 2021-05-14 MED ORDER — CEPHALEXIN 500 MG PO CAPS: 500.0000 mg | ORAL_CAPSULE | Freq: Four times a day (QID) | ORAL | 0 refills | Status: DC

## 2021-05-14 MED ORDER — CEPHALEXIN 500 MG PO CAPS
500.0000 mg | ORAL_CAPSULE | Freq: Once | ORAL | Status: AC
  Administered 2021-05-14: 500 mg via ORAL
  Filled 2021-05-14: qty 1

## 2021-05-14 NOTE — ED Provider Notes (Signed)
?[facility] DEPT ?Provider Note ? ? ?CSN: [id number] ?Arrival date & time: 05/14/21  0750 ? ?  ? ?History ? ?Chief Complaint  ?Patient presents with  ? Hyperglycemia  ? ? ?[name] [name] is a 61 y.o. female. ? ?HPI ? ?  ? ?61 year old female comes in with chief complaint of elevated blood sugar. ?Patient has history of diabetes for which she is on Trulicity.  She indicates that over the last 2 weeks she has noted that her blood sugar has been consistently in the 300-400 range, which is unusual.  She has not changed her medication nor has she changed her diet.  She has been watching her calories, with the sugars remain high. ? ?Over the last 4 days or so she has been having some urinary discomfort.  Every time she urinates, she has pressure type sensation in the lower abdomen.  She has also noted some discoloration of her urine and has increased urinary frequency and increased thirst. ? ?She denies any fevers, chills, upper abdominal pain, diarrhea, nausea, vomiting.  ? ? ? ? ?Home Medications ?Prior to Admission medications   ?Medication Sig Start Date End Date Taking? Authorizing Provider  ?cephALEXin (KEFLEX) 500 MG capsule Take 1 capsule (500 mg total) by mouth 4 (four) times daily. 05/14/21  Yes Varney Biles, MD  ?phenazopyridine (PYRIDIUM) 200 MG tablet Take 1 tablet (200 mg total) by mouth 3 (three) times daily. 05/14/21  Yes Varney Biles, MD  ?acyclovir (ZOVIRAX) 400 MG tablet Take 1 tablet (400 mg total) by mouth 3 (three) times daily. 10/24/20   Lyndal Pulley, DO  ?atorvastatin (LIPITOR) 20 MG tablet Take 1 tablet (20 mg total) by mouth daily. Last refill without OV 03/27/21   Marrian Salvage, FNP  ?Biotin 1 MG CAPS Take by mouth.    [provider]  ?glucose blood test strip Use as instructed 09/01/15   Rasch, Anderson Malta I, NP  ?ibuprofen (ADVIL,MOTRIN) 200 MG tablet Take 400 mg by mouth every 6 (six) hours as needed for mild pain or moderate pain. Reported  on 07/12/2015    [provider]  ?Lancets Glory Rosebush ULTRASOFT) lancets Use as instructed 09/01/15   Rasch, Artist Pais, NP  ?Multiple Vitamin (MULTIVITAMIN WITH MINERALS) TABS tablet Take 1 tablet by mouth daily.    [provider]  ?nystatin cream (MYCOSTATIN) Apply 1 application topically 2 (two) times daily. 05/17/20   Marrian Salvage, [place]  ?nystatin-triamcinolone (MYCOLOG II) cream Apply 1 application topically 2 (two) times daily. 05/16/20   Marrian Salvage, [place]  ?triamcinolone cream (KENALOG) 0.1 % APPLY TO AFFECTED AREA TWICE A DAY 02/13/21   Marrian Salvage, FNP  ?TRULICITY 1.5 PF/7.9KW SOPN INJECT WEEKLY AS DIRECTED 04/06/21   Marrian Salvage, FNP  ?Vitamin D, Ergocalciferol, (DRISDOL) 1.25 MG (50000 UNIT) CAPS capsule TAKE 1 CAPSULE (50,000 UNITS TOTAL) BY MOUTH EVERY 7 (SEVEN) DAYS 02/09/21   Lyndal Pulley, DO  ?   ? ?Allergies    ?Epinephrine, Lidocaine, Lisinopril, and Metformin and related   ? ?Review of Systems   ?Review of Systems  ?All other systems reviewed and are negative. ? ?Physical Exam ?Updated Vital Signs ?BP 137/88   Pulse 73   Temp 97.8 ?F (36.6 ?C) (Oral)   Resp 18   SpO2 99%  ?Physical Exam ?Vitals and nursing note reviewed.  ?Constitutional:   ?   Appearance: She is well-developed.  ?HENT:  ?   Head: Atraumatic.  ?Cardiovascular:  ?  Rate and Rhythm: Normal rate.  ?Pulmonary:  ?   Effort: Pulmonary effort is normal.  ?Abdominal:  ?   Tenderness: There is no abdominal tenderness.  ?Musculoskeletal:  ?   Cervical back: Normal range of motion and neck supple.  ?Skin: ?   General: Skin is warm and dry.  ?Neurological:  ?   Mental Status: She is alert and oriented to person, place, and time.  ? ? ?ED Results / Procedures / Treatments   ?Labs ?(all labs ordered are listed, but only abnormal results are displayed) ?Labs Reviewed  ?BASIC METABOLIC PANEL - Abnormal; Notable for the following components:  ?    Result Value  ? Glucose, Bld 363 (*)   ?  Calcium 8.7 (*)   ? All other components within normal limits  ?CBC WITH DIFFERENTIAL/PLATELET - Abnormal; Notable for the following components:  ? RBC 5.54 (*)   ? MCV 72.0 (*)   ? MCH 21.8 (*)   ? All other components within normal limits  ?URINALYSIS, ROUTINE W REFLEX MICROSCOPIC - Abnormal; Notable for the following components:  ? Specific Gravity, Urine 1.031 (*)   ? Glucose, UA >=500 (*)   ? Hgb urine dipstick SMALL (*)   ? Ketones, ur 5 (*)   ? Leukocytes,Ua LARGE (*)   ? Bacteria, UA RARE (*)   ? All other components within normal limits  ?CBG MONITORING, ED - Abnormal; Notable for the following components:  ? Glucose-Capillary 384 (*)   ? All other components within normal limits  ? ? ?EKG ?None ? ?Radiology ?No results found. ? ?Procedures ?Procedures  ? ? ?Medications Ordered in ED ?Medications  ?sodium chloride 0.9 % bolus 1,000 mL (0 mLs Intravenous Stopped 05/14/21 1103)  ?cephALEXin (KEFLEX) capsule 500 mg (500 mg Oral Given 05/14/21 1058)  ? ? ?ED Course/ Medical Decision Making/ A&P ?Clinical Course as of 05/14/21 1130  ?Nancy Fetter May 14, 2021  ?[place](!): LARGE ?There is some pyuria, large leukocytes.  ?With the clinical suspicion for UTI, UA has enough findings suggestive of bladder infection.  We will give her oral Keflex. [AN]  ?1129 CBC with Differential(!) ?No leukocytosis, no severe anemia. [AN]  ?0160 Basic metabolic panel(!) ?Patient does not have any evidence of elevated creatinine.  She does not have an anion gap with the blood sugar of 360. ? ?Considered IV insulin, however in the setting of mild hyperglycemia -fluid resuscitation likely appropriate.  Benefit does not outweigh the risk of giving IV insulin. [AN]  ?1130 The patient appears reasonably screened and/or stabilized for discharge and I doubt any other medical condition or other [facility] requiring further screening, evaluation, or treatment in the ED at this time prior to discharge. ?  ?Results from the ER workup discussed with  the patient face to face and all questions answered to the best of my ability. ?The patient is safe for discharge with strict return precautions. ? ? [AN]  ?  ?Clinical Course User Index ?[AN] Varney Biles, MD  ? ?                        ?Medical Decision Making ?Amount and/or Complexity of Data Reviewed ?Labs: ordered. ? ?Risk ?Prescription drug management. ? ? ?This patient presents to the ED with chief complaint(s) of hyperglycemia with pertinent past medical history of diabetes which further complicates the presenting complaint. The complaint involves an extensive differential diagnosis and treatment options and also carries with it a high risk  of complications and morbidity.   ? ?The differential diagnosis includes : ?DKA, HHS.  The root cause for elevated blood sugar could be because of underlying infection.  The impact of elevated blood sugar could be finding such as hyponatremia, AKI. ? ?The initial plan is to order basic labs to ensure there is no DKA, metabolic derangement, AKI. ? ?She is having some urinary symptoms that are consistent with bladder infection that she has had in the past.  I have suspicion that she has UTI, however her blood sugar has been elevated longer than her symptoms, therefore unclear if there is a direct relationship between the UTI and hyperglycemia. ? ?UA and IV fluids ordered. ? ? ?Additional history obtained: ?Additional history obtained from spouse ?Records reviewed Primary Care Documents ? ?Final Clinical Impression(s) / ED Diagnoses ?Final diagnoses:  ?Hyperglycemia  ?Acute cystitis without hematuria  ? ? ?Rx / DC Orders ?ED Discharge Orders   ? ?      Ordered  ?  cephALEXin (KEFLEX) 500 MG capsule  4 times daily       ? 05/14/21 1050  ?  phenazopyridine (PYRIDIUM) 200 MG tablet  3 times daily       ? 05/14/21 1050  ? ?  ?  ? ?  ? ? ?  ?Varney Biles, MD ?05/14/21 1130 ? ?

## 2021-05-14 NOTE — ED Triage Notes (Signed)
Pt reports hyperglycemia over the past few days. She reports CBG of 480 yesterday. Pt endorses feeling faint and nauseous. Denies any pain.  ?

## 2021-05-14 NOTE — Discharge Instructions (Addendum)
We believe that you are having bladder infection.  Start taking the antibiotics that are prescribed. ? ?Please keep a log of your blood sugars.  Follow-up with your primary care doctor in 1 week. ?

## 2021-05-16 NOTE — Telephone Encounter (Signed)
Called pt and relayed the message. Pt now has an appointment on 06/01/21.  ?

## 2021-05-18 NOTE — Telephone Encounter (Signed)
Called patient and was able to reschedule her appt for next week ?

## 2021-05-23 ENCOUNTER — Emergency Department (HOSPITAL_COMMUNITY)
Admission: EM | Admit: 2021-05-23 | Discharge: 2021-05-23 | Disposition: A | Discharge status: Home / Self Care | Payer: No Typology Code available for payment source | Attending: Emergency Medicine | Admitting: Emergency Medicine

## 2021-05-23 ENCOUNTER — Encounter (HOSPITAL_COMMUNITY): Payer: Self-pay

## 2021-05-23 ENCOUNTER — Ambulatory Visit: Payer: Self-pay

## 2021-05-23 ENCOUNTER — Other Ambulatory Visit: Payer: Self-pay

## 2021-05-23 DIAGNOSIS — Z794 Long term (current) use of insulin: Secondary | ICD-10-CM | POA: Insufficient documentation

## 2021-05-23 DIAGNOSIS — E1165 Type 2 diabetes mellitus with hyperglycemia: Secondary | ICD-10-CM | POA: Insufficient documentation

## 2021-05-23 DIAGNOSIS — R739 Hyperglycemia, unspecified: Secondary | ICD-10-CM | POA: Diagnosis present

## 2021-05-23 LAB — CBC
HCT: 39.1 % (ref 36.0–46.0)
Hemoglobin: 12 g/dL (ref 12.0–15.0)
MCH: 21.9 pg — ABNORMAL LOW (ref 26.0–34.0)
MCHC: 30.7 g/dL (ref 30.0–36.0)
MCV: 71.4 fL — ABNORMAL LOW (ref 80.0–100.0)
Platelets: 212 10*3/uL (ref 150–400)
RBC: 5.48 MIL/uL — ABNORMAL HIGH (ref 3.87–5.11)
RDW: 13.7 % (ref 11.5–15.5)
WBC: 4.2 10*3/uL (ref 4.0–10.5)
nRBC: 0 % (ref 0.0–0.2)

## 2021-05-23 LAB — URINALYSIS, MICROSCOPIC (REFLEX)

## 2021-05-23 LAB — BASIC METABOLIC PANEL
Anion gap: 10 (ref 5–15)
BUN: 10 mg/dL (ref 8–23)
CO2: 24 mmol/L (ref 22–32)
Calcium: 8.5 mg/dL — ABNORMAL LOW (ref 8.9–10.3)
Chloride: 100 mmol/L (ref 98–111)
Creatinine, Ser: 0.89 mg/dL (ref 0.44–1.00)
GFR, Estimated: >60 mL/min (ref >60)
Glucose, Bld: 475 mg/dL — ABNORMAL HIGH (ref 70–99)
Potassium: 3.8 mmol/L (ref 3.5–5.1)
Sodium: 134 mmol/L — ABNORMAL LOW (ref 135–145)

## 2021-05-23 LAB — URINALYSIS, ROUTINE W REFLEX MICROSCOPIC
Bilirubin Urine: NEGATIVE
Glucose, UA: >=500 mg/dL — AB
Ketones, ur: 15 mg/dL — AB
Leukocytes,Ua: NEGATIVE
Nitrite: NEGATIVE
Protein, ur: NEGATIVE mg/dL
Specific Gravity, Urine: <1.005 — ABNORMAL LOW (ref 1.005–1.030)
pH: 6 (ref 5.0–8.0)

## 2021-05-23 LAB — CBG MONITORING, ED
Glucose-Capillary: 419 mg/dL — ABNORMAL HIGH (ref 70–99)
Glucose-Capillary: 308 mg/dL — ABNORMAL HIGH (ref 70–99)

## 2021-05-23 MED ORDER — SODIUM CHLORIDE 0.9 % IV BOLUS
1000.0000 mL | Freq: Once | INTRAVENOUS | Status: AC
Start: 2021-05-23 — End: 2021-05-23
  Administered 2021-05-23: 1000 mL via INTRAVENOUS

## 2021-05-23 NOTE — ED Provider Notes (Signed)
?[facility] DEPT ?Provider Note ? ? ?CSN: [id number] ?Arrival date & time: 05/23/21  1556 ? ?  ? ?History ? ?Chief Complaint  ?Patient presents with  ? Hyperglycemia  ? ? ?[name] [name] is a 61 y.o. female. Patient presents to the emergency department with concerns of hyperglycemia. The patient was seen at the emergency department on 3/12 and was found to be hyperglycemic and to have a UTI. She was prescribed Keflex. The patient has continued to have high glucose readings at home. She is worried because she feels that her glucose was not rechecked prior to discharge. The patient has PMH significant for type 2 diabetes, hyperlipidemia, and obesity. Patient currently takes trulicity. CBG 419 on arrival.  ? ?HPI ? ?  ? ?Home Medications ?Prior to Admission medications   ?Medication Sig Start Date End Date Taking? Authorizing Provider  ?acyclovir (ZOVIRAX) 400 MG tablet Take 1 tablet (400 mg total) by mouth 3 (three) times daily. 10/24/20   Lyndal Pulley, DO  ?atorvastatin (LIPITOR) 20 MG tablet Take 1 tablet (20 mg total) by mouth daily. Last refill without OV 03/27/21   Marrian Salvage, FNP  ?Biotin 1 MG CAPS Take by mouth.    [provider]  ?cephALEXin (KEFLEX) 500 MG capsule Take 1 capsule (500 mg total) by mouth 4 (four) times daily. 05/14/21   Varney Biles, MD  ?glucose blood test strip Use as instructed 09/01/15   Rasch, Anderson Malta I, NP  ?ibuprofen (ADVIL,MOTRIN) 200 MG tablet Take 400 mg by mouth every 6 (six) hours as needed for mild pain or moderate pain. Reported on 07/12/2015    [provider]  ?Lancets Glory Rosebush ULTRASOFT) lancets Use as instructed 09/01/15   Rasch, Artist Pais, NP  ?Multiple Vitamin (MULTIVITAMIN WITH MINERALS) TABS tablet Take 1 tablet by mouth daily.    [provider]  ?nystatin cream (MYCOSTATIN) Apply 1 application topically 2 (two) times daily. 05/17/20   Marrian Salvage, [place]  ?nystatin-triamcinolone (MYCOLOG II)  cream Apply 1 application topically 2 (two) times daily. 05/16/20   Marrian Salvage, [place]  ?phenazopyridine (PYRIDIUM) 200 MG tablet Take 1 tablet (200 mg total) by mouth 3 (three) times daily. 05/14/21   Varney Biles, MD  ?triamcinolone cream (KENALOG) 0.1 % APPLY TO AFFECTED AREA TWICE A DAY 02/13/21   Marrian Salvage, FNP  ?TRULICITY 1.5 LT/9.0ZE SOPN INJECT WEEKLY AS DIRECTED 04/06/21   Marrian Salvage, FNP  ?Vitamin D, Ergocalciferol, (DRISDOL) 1.25 MG (50000 UNIT) CAPS capsule TAKE 1 CAPSULE (50,000 UNITS TOTAL) BY MOUTH EVERY 7 (SEVEN) DAYS 02/09/21   Lyndal Pulley, DO  ?   ? ?Allergies    ?Epinephrine, Lidocaine, Lisinopril, and Metformin and related   ? ?Review of Systems   ?Review of Systems  ?Constitutional:  Negative for fatigue and fever.  ?Respiratory:  Negative for shortness of breath.   ?Cardiovascular:  Negative for chest pain.  ?Gastrointestinal:  Negative for nausea.  ?Endocrine: Positive for polydipsia, polyphagia and polyuria.  ?Genitourinary:  Positive for dysuria.  ? ?Physical Exam ?Updated Vital Signs ?BP (!) 145/85   Pulse 77   Temp 98 ?F (36.7 ?C) (Oral)   Resp 16   Ht 5' (1.524 m)   Wt 81.6 kg   SpO2 100%   BMI 35.15 kg/m?  ?Physical Exam ?Vitals and nursing note reviewed.  ?Constitutional:   ?   General: She is not in acute distress. ?HENT:  ?   Head: Normocephalic.  ?Eyes:  ?  Conjunctiva/sclera: Conjunctivae normal.  ?Cardiovascular:  ?   Rate and Rhythm: Normal rate and regular rhythm.  ?   Pulses: Normal pulses.  ?   Heart sounds: Normal heart sounds.  ?Pulmonary:  ?   Effort: Pulmonary effort is normal.  ?   Breath sounds: Normal breath sounds.  ?Musculoskeletal:  ?   Cervical back: Normal range of motion.  ?Skin: ?   General: Skin is warm and dry.  ?Neurological:  ?   Mental Status: She is alert.  ? ? ?ED Results / Procedures / Treatments   ?Labs ?(all labs ordered are listed, but only abnormal results are displayed) ?Labs Reviewed  ?BASIC METABOLIC  PANEL - Abnormal; Notable for the following components:  ?    Result Value  ? Sodium 134 (*)   ? Glucose, Bld 475 (*)   ? Calcium 8.5 (*)   ? All other components within normal limits  ?CBC - Abnormal; Notable for the following components:  ? RBC 5.48 (*)   ? MCV 71.4 (*)   ? MCH 21.9 (*)   ? All other components within normal limits  ?URINALYSIS, ROUTINE W REFLEX MICROSCOPIC - Abnormal; Notable for the following components:  ? Color, Urine STRAW (*)   ? Specific Gravity, Urine <1.005 (*)   ? Glucose, UA >=500 (*)   ? Hgb urine dipstick TRACE (*)   ? Ketones, ur 15 (*)   ? All other components within normal limits  ?URINALYSIS, MICROSCOPIC (REFLEX) - Abnormal; Notable for the following components:  ? Bacteria, UA RARE (*)   ? All other components within normal limits  ?CBG MONITORING, ED - Abnormal; Notable for the following components:  ? Glucose-Capillary 419 (*)   ? All other components within normal limits  ?CBG MONITORING, ED - Abnormal; Notable for the following components:  ? Glucose-Capillary 308 (*)   ? All other components within normal limits  ?CBG MONITORING, ED  ? ? ?EKG ?None ? ?Radiology ?No results found. ? ?Procedures ?Procedures  ? ? ?Medications Ordered in ED ?Medications  ?sodium chloride 0.9 % bolus 1,000 mL (1,000 mLs Intravenous Bolus 05/23/21 1713)  ? ? ?ED Course/ Medical Decision Making/ A&P ?  ?                        ?Medical Decision Making ?Amount and/or Complexity of Data Reviewed ?Labs: ordered. ? ? ?This patient presents to the ED for concern of hyperglycemia, this involves an extensive number of treatment options, and is a complaint that carries with it a high risk of complications and morbidity.  The differential diagnosis includes but is not limited to DKA, HHS, and others ? ? ?Co morbidities that complicate the patient evaluation ? ?DM Type 2 ? ? ?Additional history obtained: ? ?Additional history obtained from patient's spouse ?External records from outside source obtained and  reviewed including ED note from last week ? ? ?Lab Tests: ? ?I Ordered, and personally interpreted labs.  The pertinent results include:  Glucose 475, repeat glucose 308 ? ? ? ?Problem List / ED Course / Critical interventions / Medication management ? ? ?I ordered medication including normal saline for rehydration  ?Reevaluation of the patient after these medicines showed that the patient improved ?I have reviewed the patients home medicines and have made adjustments as needed ? ? ? ?Test / Admission - Considered: ? ?The patient's glucose level improved with administration of fluids.  Lab work shows no signs of systemic infection.  There are no symptoms of DKA or HHS. She has recently had a urinary tract infection.  This may have led to the increase in her blood glucose levels over the past few weeks.  She has been on the same medication for the past 4 years for her diabetes.  I see no reason for admission at this time.  I recommend follow-up with her primary care provider at her scheduled appointment this Thursday to discuss management of her diabetes.  I discussed the plan with the patient who voiced understanding.  Return precautions were provided. ? ? ?Final Clinical Impression(s) / ED Diagnoses ?Final diagnoses:  ?Hyperglycemia  ? ? ?Rx / DC Orders ?ED Discharge Orders   ? ? None  ? ?  ? ? ?  ?Dorothyann Peng, PA-C ?05/23/21 2029 ? ?  ?Valarie Merino, MD ?05/23/21 2303 ? ?

## 2021-05-23 NOTE — Discharge Instructions (Addendum)
You were seen today for hyperglycemia. Your blood glucose level improved with fluid administration. I recommend follow up with your PCP for further evaluation of your diabetes at your scheduled appointment this Thursday. Return to the emergency department if you develop life threatening conditions such as chest pain or shortness of breath ?

## 2021-05-23 NOTE — Telephone Encounter (Signed)
?  Chief Complaint: hyperglycemia ?Symptoms: BS 489, dry mouth constantly, increased thirst and urination ?Frequency: been going on since 05/14/21 ?Pertinent Negatives: Patient denies vomiting or dizziness ?Disposition: '[x]'$ ED /'[]'$ Urgent Care (no appt availability in office) / '[]'$ Appointment(In office/virtual)/ '[]'$  [facility]/ '[]'$ Home Care/ '[]'$ Refused Recommended Disposition /'[]'$ [facility]/ '[]'$  Follow-up with PCP ?Additional Notes: pt states no matter what she cant get BS down and is constantly putting something in her mouth to keep it moist. Pt is concerned because the trulicity isnt working for her anymore. She also reports seeing black little specs in the toilet when she urinates. Pt has appt with new PCP but not until 05/25/21.  ? ?Reason for Disposition ? Blood glucose > 400 mg/dL (22.2 mmol/L) ? ?Answer Assessment - Initial Assessment Questions ?1. BLOOD GLUCOSE: "What is your blood glucose level?"  ?    489 ?2. ONSET: "When did you check the blood glucose?" ?    Yesterday ?5. TYPE 1 or 2:  "Do you know what type of diabetes you have?"  (e.g., Type 1, Type 2, Gestational; doesn't know)  ?    DM 2  ?6. INSULIN: "Do you take insulin?" "What type of insulin(s) do you use? What is the mode of delivery? (syringe, pen; injection or pump)?"  ?    Yes, insulin pen  ?7. DIABETES PILLS: "Do you take any pills for your diabetes?" If yes, ask: "Have you missed taking any pills recently?" ?    No ?8. OTHER SYMPTOMS: "Do you have any symptoms?" (e.g., fever, frequent urination, difficulty breathing, dizziness, weakness, vomiting) ?    Dry mouth, increased thirst, urinating frequently ? ?Protocols used: Diabetes - High Blood Sugar-A-AH ? ?

## 2021-05-23 NOTE — ED Triage Notes (Addendum)
Patient c/o hyperglycemia since being discharged from the ED on 05/14/21. Patient states her sugar has been >500. ?CBG in triage-419. ? ?Patient denies any n/V/D or blurred vision. ?

## 2021-05-25 ENCOUNTER — Encounter: Payer: Self-pay | Admitting: Family Medicine

## 2021-05-25 ENCOUNTER — Ambulatory Visit: Payer: No Typology Code available for payment source | Admitting: Family Medicine

## 2021-05-25 ENCOUNTER — Other Ambulatory Visit: Payer: Self-pay

## 2021-05-25 VITALS — BP 150/76 | HR 76 | Temp 98.2°F | Ht 60.0 in | Wt 182.0 lb

## 2021-05-25 DIAGNOSIS — R350 Frequency of micturition: Secondary | ICD-10-CM | POA: Diagnosis not present

## 2021-05-25 DIAGNOSIS — E1165 Type 2 diabetes mellitus with hyperglycemia: Secondary | ICD-10-CM

## 2021-05-25 DIAGNOSIS — E669 Obesity, unspecified: Secondary | ICD-10-CM

## 2021-05-25 DIAGNOSIS — E1169 Type 2 diabetes mellitus with other specified complication: Secondary | ICD-10-CM | POA: Diagnosis not present

## 2021-05-25 DIAGNOSIS — N898 Other specified noninflammatory disorders of vagina: Secondary | ICD-10-CM

## 2021-05-25 DIAGNOSIS — E785 Hyperlipidemia, unspecified: Secondary | ICD-10-CM

## 2021-05-25 LAB — POCT URINALYSIS DIPSTICK
Bilirubin, UA: NEGATIVE
Blood, UA: NEGATIVE
Glucose, UA: POSITIVE — AB
Leukocytes, UA: NEGATIVE
Nitrite, UA: NEGATIVE
Protein, UA: NEGATIVE
Spec Grav, UA: 1.01 (ref 1.010–1.025)
Urobilinogen, UA: 0.2 E.U./dL
pH, UA: 6 (ref 5.0–8.0)

## 2021-05-25 LAB — POCT GLYCOSYLATED HEMOGLOBIN (HGB A1C): Hemoglobin A1C: 13.1 % — AB (ref 4.0–5.6)

## 2021-05-25 LAB — POCT CBG (FASTING - GLUCOSE)-MANUAL ENTRY: Glucose Fasting, POC: 410 mg/dL — AB (ref 70–99)

## 2021-05-25 MED ORDER — FLUCONAZOLE 150 MG PO TABS: 150.0000 mg | ORAL_TABLET | Freq: Once | ORAL | 1 refills | Status: AC

## 2021-05-25 MED ORDER — DAPAGLIFLOZIN PROPANEDIOL 5 MG PO TABS: 5.0000 mg | ORAL_TABLET | Freq: Every day | ORAL | 0 refills | Status: DC

## 2021-05-25 MED ORDER — INSULIN PEN NEEDLE 32G X 4 MM MISC: 1 refills | Status: DC

## 2021-05-25 MED ORDER — LANTUS SOLOSTAR 100 UNIT/ML ~~LOC~~ SOPN: 10.0000 [IU] | PEN_INJECTOR | Freq: Every day | SUBCUTANEOUS | 2 refills | Status: DC

## 2021-05-25 NOTE — Progress Notes (Signed)
? ?Subjective:  ? ? Patient ID: [name] [name], female    DOB: 1960/03/16, 61 y.o.   MRN: [id number] ? ?HPI ?Chief Complaint  ?Patient presents with  ? Transitions Of Care  ?  Sugars have been going up for about a month now, is feeling fatigued and stressed and would just like to figure out how to keep it under control. Has been to the hospital twice in the past month.  ? ?She is a new patient and here to establish care. History of diabetes that was previously fairly well controlled but became hyperglycemic in the past month or so.  ? ?States her blood sugars have been elevated all month. Reports having a sinus infection.  ?Her blood sugar fasting has been 400  and then a UTI which may have contributed to recent BS over 400. She has been to the ED twice for hyperglycemia.  ?States she took Keflex prescribed by the ED only a few days and stopped without completing the antibiotic due to side effects. Requests diflucan for vaginal itching.  ? ?Complains of vision changes, increased thirst, decreased appetite, and urinary frequency.  ? ?Denies fever, chills, dizziness, headache, chest pain, palpitations, shortness of breath, abdominal pain, N/V/D, or LE edema.  ? ? ?States she did not tolerate metformin. States she has been on Trulicity for the past 4 years.  ? ?She checks her BS at home and the reading goes to a device the nurse at [employer] (her employer). They let her know when her BS is abnormal.  ? ? ?Hgb A1c 7.1% on 2022 ? ? ?Review of Systems ?Pertinent positives and negatives in the history of present illness. ? ?   ?Objective:  ? Physical Exam ?BP (!) 150/76 (BP Location: Left Arm, Patient Position: Sitting, Cuff Size: Large)   Pulse 76   Temp 98.2 ?F (36.8 ?C) (Oral)   Ht 5' (1.524 m)   Wt 182 lb (82.6 kg)   SpO2 98%   BMI 35.54 kg/m?  ?Alert and in no distress.  Neck is supple. Cardiac exam shows a regular rhythm without murmurs or gallops. Lungs are clear to auscultation. Abdomen soft, non  distended, non tender, normal BS. Normal pulses. Skin is warm and dry. Normal speech, mood and memory. PERRLA, EOMs intact. CNs intact.  ? ? ?   ?Assessment & Plan:  ?Type 2 diabetes mellitus with hyperglycemia, without long-term current use of insulin (HCC) - Plan: POCT CBG (Fasting - Glucose), POCT HgB A1C, dapagliflozin propanediol (FARXIGA) 5 MG TABS tablet, insulin glargine (LANTUS SOLOSTAR) 100 UNIT/ML Solostar Pen, Ambulatory referral to Endocrinology, Insulin Pen Needle 32G X 4 MM MISC ? ?Hyperlipidemia associated with type 2 diabetes mellitus ([place]) ? ?Urinary frequency - Plan: POCT urinalysis dipstick ? ?Obesity (BMI 30-39.9) ? ?Vaginal itching - Plan: fluconazole (DIFLUCAN) 150 MG tablet ? ?POC fasting glucose finger stick 410  ?Hgb A1c 13.1%  ?UA- pos ketones and glucose, negative leuks, pro, blood ?In depth counseling done on diabetes spectrum.  ?Counseling on when to check blood sugars and recommended readings.  ?Counseling on diet modifications to help lower blood sugar.  ?She will continue on Trulicity. Farxiga added sample provided #7 tablets. 30 day prescription with voucher provided.  ?Start Lantus 10 units daily. 2 sample pens provided with needles. CMA Jarrett Soho taught her how to administer medication.  ?Advised to call if BS is <100 and to not use insulin if this occurs.  ?Referral to endocrinologist.  ?Continue statin ?She will need an ace or  arb and recheck of renal function  ?Discussed diabetic eye exam once her BS is better controlled.  ?She will follow up her as needed or in 4 weeks for other health concerns.  ? ? ?

## 2021-05-25 NOTE — Patient Instructions (Signed)
Your hemoglobin A1c is 13.1% today. ? ?Continue Trulicity once weekly. ?Start Farxiga oral medication once daily. ?Start Lantus long-acting insulin 10 units daily. ? ?You will hear from [facility] endocrinology to schedule a visit with a diabetes specialist. ? ?Continue checking your blood sugars either fasting or 2 hours after a meal. ? ?If you have blood sugar less than 100, call us and we will need to lower the insulin dose. ? ?Eat small frequent meals with protein.  Limit sugar including fruit and fruit juice. ? ?Limit carbohydrates such as potatoes, pasta, bread, rice. ? ?Try to limit carbohydrates to 45-60 per meal and 15 per snack. ? ?I prescribed Diflucan.  Take 1 pill in 3 days later if you are still having itching, you may take the second pill. ? ?Call or return if you have any questions ?

## 2021-06-01 ENCOUNTER — Encounter: Payer: No Typology Code available for payment source | Admitting: Family Medicine

## 2021-06-07 ENCOUNTER — Encounter: Payer: Self-pay | Admitting: Family Medicine

## 2021-06-08 ENCOUNTER — Telehealth: Payer: Self-pay

## 2021-06-08 ENCOUNTER — Other Ambulatory Visit: Payer: Self-pay | Admitting: Family Medicine

## 2021-06-08 MED ORDER — FLUCONAZOLE 150 MG PO TABS: 150.0000 mg | ORAL_TABLET | Freq: Once | ORAL | 0 refills | Status: DC

## 2021-06-08 NOTE — Telephone Encounter (Signed)
Pt is reporting still having a bad yeast infection. ? ?Pt was under the impression that Wilder Glade was used to treat Yeast infections and is just now calling in stating that its not getting better. ? ?I advised pt that Wilder Glade is used to treat DM/Blood Sugar and not Yeast Infection.  ? ?Pt states her husband only pick [place] and not the Diflucan.  ? ?Pt is requesting that another Rx for the Diflucan be called in to: ? [id number] [id number] ?

## 2021-06-08 NOTE — Telephone Encounter (Signed)
Handled in another encounter

## 2021-06-08 NOTE — Telephone Encounter (Signed)
LM letting pt know rx was sent to her pharmacy ?

## 2021-06-08 NOTE — Telephone Encounter (Signed)
Called pharmacy and confirmed pt did pick up the medication on 05/25/21 along with the [place].  ? ?Pt states her yeast infection has not gotten any better and is requesting another round of Diflucan. ?

## 2021-06-12 ENCOUNTER — Telehealth: Payer: Self-pay | Admitting: Family Medicine

## 2021-06-12 ENCOUNTER — Telehealth: Payer: Self-pay

## 2021-06-12 ENCOUNTER — Other Ambulatory Visit: Payer: Self-pay | Admitting: Family Medicine

## 2021-06-12 DIAGNOSIS — E1165 Type 2 diabetes mellitus with hyperglycemia: Secondary | ICD-10-CM

## 2021-06-12 NOTE — Telephone Encounter (Signed)
Sent message to provider for change in prescription ?

## 2021-06-12 NOTE — Telephone Encounter (Signed)
Pt states insurance will not cover insulin glargine (LANTUS SOLOSTAR) 100 UNIT/ML Solostar Pen. Requesting a generic form.  ? ?[id number]  [phone] ?Fax:  [phone] ?  ? ?

## 2021-06-12 NOTE — Telephone Encounter (Signed)
Received message from Pharmacy to either change prescription or start PA for Lantus Solostar. PA started. Key: [id number] ?

## 2021-06-12 NOTE — Telephone Encounter (Signed)
Insurance does not cover and completed PA and was denied. Pt will need to try formulary alternative that is covered which is either: BASAGLAR or [place]. Please send in new prescription. ?

## 2021-06-12 NOTE — Telephone Encounter (Addendum)
PA was denied. Pt will need to try formulary alternative that is covered by insurance. Covered alternatives: BASAGLAR and LEVEMIR. Message sent to provider ?

## 2021-06-13 ENCOUNTER — Telehealth: Payer: Self-pay | Admitting: Family Medicine

## 2021-06-13 NOTE — Telephone Encounter (Signed)
Called and spoke w patient regarding rx request as pharmacy notified us that [place] and Levemir was covered. Pt states copay was the $500. I advised pt to call her insurance and report back to [place] what they cover so that we can send in something that is more cost effective. Pt verbalized understanding ?

## 2021-06-13 NOTE — Telephone Encounter (Signed)
Insulin Glargine (BASAGLAR KWIKPEN) 100 UNIT/ML states it was 500.00 and she can't afford it.  ? ?Wanted to know if something else can be prescribed.  ?

## 2021-06-20 NOTE — Telephone Encounter (Signed)
NOTE NOT NEEDED ?

## 2021-06-21 ENCOUNTER — Ambulatory Visit: Payer: No Typology Code available for payment source | Admitting: Family Medicine

## 2021-06-21 ENCOUNTER — Encounter: Payer: Self-pay | Admitting: Family Medicine

## 2021-06-21 VITALS — BP 112/76 | HR 77 | Temp 97.7°F | Ht 60.0 in | Wt 173.0 lb

## 2021-06-21 DIAGNOSIS — R682 Dry mouth, unspecified: Secondary | ICD-10-CM

## 2021-06-21 DIAGNOSIS — R350 Frequency of micturition: Secondary | ICD-10-CM | POA: Diagnosis not present

## 2021-06-21 DIAGNOSIS — E1165 Type 2 diabetes mellitus with hyperglycemia: Secondary | ICD-10-CM | POA: Diagnosis not present

## 2021-06-21 DIAGNOSIS — R630 Anorexia: Secondary | ICD-10-CM | POA: Diagnosis not present

## 2021-06-21 MED ORDER — DAPAGLIFLOZIN PROPANEDIOL 5 MG PO TABS: 5.0000 mg | ORAL_TABLET | Freq: Every day | ORAL | 0 refills | Status: DC

## 2021-06-21 NOTE — Progress Notes (Signed)
? ?  Subjective:  ? ? Patient ID: [name] [name], female    DOB: 1960-11-23, 61 y.o.   MRN: [id number] ? ?HPI ?Chief Complaint  ?Patient presents with  ? Follow-up  ?  Diabetes f/u. Still has not been able to get insulin as it was too expensive but has 3 days left. Has not been eating due to dry throat, will sometimes be able to eat fruit only if it's cold. Dry mouth also noted. This has been going on for about a month now.   ? Urinary Frequency  ?  Urinary frequency still has been going on, recently was unable to get to bathroom on time   ? ?She is here for a 4 week follow up on uncontrolled DM. She has not yet heard from endocrinology office. I referred her at her previous visit.  ?Hgb A1c 13.1% on 05/25/21 ?We started her on basal insulin 10 units daily and Farxiga 5 mg.  ?She was previously on Trulicity and we continued her on that.  ?States her blood sugars have been 250 fasting. No readings to report postprandial.  ? ?States she has a poor appetite and has only been eating cold fruit. Reports dry mouth and urinary frequency.  ?States she has lost 10 lbs.  ? 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 States she used GoodRx also.  ? ?She reports taking atorvastatin daily.  ? ? ?Denies fever, chills, dizziness, chest pain, palpitations, shortness of breath, abdominal pain, N/V/D. ? ? ? ? ?Review of Systems ?Pertinent positives and negatives in the history of present illness. ? ?   ?Objective:  ? Physical Exam ?BP 112/76 (BP Location: Left Arm, Patient Position: Sitting, Cuff Size: Large)   Pulse 77   Temp 97.7 ?F (36.5 ?C) (Temporal)   Ht 5' (1.524 m)   Wt 173 lb (78.5 kg)   SpO2 98%   BMI 33.79 kg/m?  ? ?Alert and oriented and in no acute distress. Not otherwise examined.  ? ? ?   ?Assessment & Plan:  ?Uncontrolled type 2 diabetes mellitus with hyperglycemia ([place]) - Plan: dapagliflozin propanediol (FARXIGA) 5 MG TABS tablet ? ?Poor appetite ? ?Dry  mouth ? ?Urinary frequency ? ?She is still symptomatic and blood sugars are much higher than goal.  ?Increase Lantus to 15 units daily. 2 sample pens provided.  ?Continue Farxiga 5 mg.  ?Continue Trulicity.  ?Keep an eye on blood sugars.  ?Jarrett Soho will check on referral to [facility] to see if they can get her in soon.  ?

## 2021-06-21 NOTE — Patient Instructions (Signed)
INCREASE your Lantus to 15 units per day.  ? ?Continue Farxiga 5 mg daily and Trulicity weekly.  ? ?You should hear from [facility] soon to schedule a visit.  ?

## 2021-06-22 ENCOUNTER — Ambulatory Visit: Payer: No Typology Code available for payment source | Admitting: Family Medicine

## 2021-06-23 ENCOUNTER — Other Ambulatory Visit: Payer: Self-pay | Admitting: Family Medicine

## 2021-06-26 ENCOUNTER — Telehealth: Payer: Self-pay

## 2021-06-26 NOTE — Telephone Encounter (Signed)
Pt is requesting new ENDO referral bc [facility] is scheduling out to [place]. Pt states she needs someone ASAP.  ?Pt is willing to travel to [place]. ? ?Please advise ?

## 2021-06-26 NOTE — Telephone Encounter (Signed)
Pt requesting a referral to [facility] at [place] [place]   ? ?Phone [phone] ? ?Fax [phone] ? ?Pt requesting urgent noted on referral to get sooner appt  ? ?Pt states she assumes location is in her network ? ?Pt requesting a cb w/ status update ?

## 2021-06-30 ENCOUNTER — Telehealth: Payer: Self-pay | Admitting: Family Medicine

## 2021-06-30 NOTE — Telephone Encounter (Signed)
Pt also wanted noted that since her blood sugars have been up she has been having reoccurring yeast infections that have not gone away. She knows that it is due to the amount of glucose she is excreting but states she just wants it to go away. ?

## 2021-06-30 NOTE — Telephone Encounter (Signed)
Per Vickie- called and notified patient to increase Lantus to 20 units daily until seen by Vickie. Informed her that Loletha Carrow would like for her to be seen next week on Wednesday, 5/3 for diabetes follow up to check blood sugars and she booked appt. Pt verbalized understanding and states she already took insulin today so will start the 20 units tomorrow. Pt is worried as she states she has been trying everything to get her sugars down including limiting foods and different medications and it seems as if nothing is working. I tried to reassure pt and let her know we will help her manage her diabetes until seen by Endo.  ?

## 2021-06-30 NOTE — Telephone Encounter (Signed)
Pt called in and states that endocrinologist cannot see her until June.  ? ?They recommended that she call her PCP to ask to be admitted to hospital to help control blood sugar.  ? ?States she is free tomorrow from 7-10.  ? ?Requesting callback from assistant.  ?

## 2021-07-02 ENCOUNTER — Other Ambulatory Visit: Payer: Self-pay | Admitting: Family Medicine

## 2021-07-03 ENCOUNTER — Telehealth: Payer: Self-pay | Admitting: Family Medicine

## 2021-07-03 NOTE — Telephone Encounter (Signed)
1.Medication Requested: ?Fluconazole (DIFLUCAN) 150 MG tab ?2. Pharmacy (Name, [place], [place]): [id number]  [phone] ?Fax:  [phone] ?  ? ?3. On Med List: no  ? ?4. Last Visit with PCP: ? ?5. Next visit date with PCP: ? ? ?Pt called in and states she is wanting a refill on this medication.  ? ?Pt knows there was not refill ordered but UTI has not cleared much.  ? ?Agent: Please be advised that RX refills may take up to 3 business days. We ask that you follow-up with your pharmacy.  ?

## 2021-07-05 ENCOUNTER — Ambulatory Visit: Payer: No Typology Code available for payment source | Admitting: Family Medicine

## 2021-07-05 ENCOUNTER — Encounter: Payer: Self-pay | Admitting: Family Medicine

## 2021-07-05 ENCOUNTER — Other Ambulatory Visit: Payer: Self-pay | Admitting: Family

## 2021-07-05 VITALS — BP 130/84 | HR 70 | Temp 98.0°F | Ht 60.0 in | Wt 178.0 lb

## 2021-07-05 DIAGNOSIS — E1169 Type 2 diabetes mellitus with other specified complication: Secondary | ICD-10-CM | POA: Diagnosis not present

## 2021-07-05 DIAGNOSIS — E559 Vitamin D deficiency, unspecified: Secondary | ICD-10-CM

## 2021-07-05 DIAGNOSIS — E1165 Type 2 diabetes mellitus with hyperglycemia: Secondary | ICD-10-CM

## 2021-07-05 DIAGNOSIS — N898 Other specified noninflammatory disorders of vagina: Secondary | ICD-10-CM

## 2021-07-05 DIAGNOSIS — E785 Hyperlipidemia, unspecified: Secondary | ICD-10-CM

## 2021-07-05 MED ORDER — TRULICITY 1.5 MG/0.5ML ~~LOC~~ SOAJ: 1.5000 mg | SUBCUTANEOUS | 0 refills | Status: DC

## 2021-07-05 NOTE — Assessment & Plan Note (Signed)
Continue vitamin D supplement. Recheck vitamin D level at her next visit  ?

## 2021-07-05 NOTE — Progress Notes (Signed)
? ?Subjective:  ? ? ? Patient ID: [name] [name], female    DOB: 08-29-1960, 61 y.o.   MRN: [id number] ? ?Chief Complaint  ?Patient presents with  ? Diabetes  ?  High blood sugars, also has questions about farxiga  ? ? ?HPI ?Patient is in today for follow up on uncontrolled diabetes. States Wilder Glade is causing her to have recurrent yeast infections and she would like to stop it. She has taken multiple doses of Diflucan.  ? ?Blood sugars were in the 400 range at her previous visit and her Hgb A1c was 13.1% on 05/25/2021.  ?Reports blood sugars this week have been in the 200 range. This morning her fasting BS was 268. States she is not as thirsty and is not urinating as often.  ? ?She was previously on Lantus 10 units and we have gradually increased her dose to 20 units. She has been at this dose for the past 3 days.  ? ?She is taking Trulicity 1.5 mg weekly. Needs a refill.  ? ?She did not tolerate metformin in the past.  ? ? ?Vitamin D def- reports taking a supplement.  ? ?Reports taking statin daily with no side effects.  ? ?Denies fever, chills, dizziness, chest pain, palpitations, shortness of breath, abdominal pain, N/V/D ? ? ? ? ?Health Maintenance Due  ?Topic Date Due  ? Zoster Vaccines- Shingrix (1 of 2) Never done  ? FOOT EXAM  01/05/2020  ? OPHTHALMOLOGY EXAM  01/17/2021  ? URINE MICROALBUMIN  04/19/2021  ? ? ?Past Medical History:  ?Diagnosis Date  ? Abdominal hernia   ? 4 hernias per pt  ? Abdominal pain   ? mid abd pain radiating to right side  ? Anemia   ? Cancer [facility])   ? rectal ca  ? Change in bowel movement   ? Colon cancer ([place])   ? Complication of anesthesia   ? cardiac arrest during nasal surgery  ? Diabetes mellitus without complication ([place])   ? Headache   ? History of rectal bleeding   ? ? ?Past Surgical History:  ?Procedure Laterality Date  ? CESAREAN SECTION    ? colon cancer    ? COLON SURGERY    ? COLONOSCOPY    ? LAPAROSCOPIC LYSIS OF ADHESIONS  04/02/2014  ? Procedure: LAPAROSCOPIC LYSIS OF  ADHESIONS;  Surgeon: Jackolyn Confer, MD;  Location: [facility];  Service: General;;  ? NOSE SURGERY    ? POLYPECTOMY    ? VENTRAL HERNIA REPAIR N/A 04/02/2014  ? Procedure: LAPAROSCOPIC  LYSIS OF ADHESIONS AND REPAIR OF VENTRAL INCISIONAL HERNIAS WITH MESH;  Surgeon: Jackolyn Confer, MD;  Location: [facility];  Service: General;  Laterality: N/A;  ? ? ?Family History  ?Problem Relation Age of Onset  ? Uterine cancer Mother   ? Colon cancer Maternal Aunt   ? Breast cancer Paternal Aunt   ? Colon cancer Maternal Grandmother   ? Breast cancer Cousin   ? Breast cancer Cousin   ? Diabetes Neg Hx   ? Rectal cancer Neg Hx   ? Stomach cancer Neg Hx   ? ? ?Social History  ? ?Socioeconomic History  ? Marital status: Married  ?  Spouse name: Not on file  ? Number of children: 3  ? Years of education: 8  ? Highest education level: Not on file  ?Occupational History  ? Occupation: Therapist, art  ?Tobacco Use  ? Smoking status: Former  ?  Packs/day: 0.25  ?  Years: 13.00  ?  Pack years: 3.25  ?  Types: Cigarettes  ?  Quit date: 03/14/2003  ?  Years since quitting: 18.3  ? Smokeless tobacco: Never  ?Vaping Use  ? Vaping Use: Some days  ? Substances: Flavoring  ? Devices: flavored , non-nicotine  ?Substance and Sexual Activity  ? Alcohol use: Yes  ?  Alcohol/week: 0.0 standard drinks  ?  Comment: occasional   ? Drug use: No  ? Sexual activity: Not Currently  ?Other Topics Concern  ? Not on file  ?Social History Narrative  ? Fun: Camping, Cruise   ? Denies abuse and feels safe at home.   ? ?Social Determinants of Health  ? ?Financial Resource Strain: Not on file  ?Food Insecurity: Not on file  ?Transportation Needs: Not on file  ?Physical Activity: Not on file  ?Stress: Not on file  ?Social Connections: Not on file  ?Intimate Partner Violence: Not on file  ? ? ?Outpatient Medications Prior to Visit  ?Medication Sig Dispense Refill  ? atorvastatin (LIPITOR) 20 MG tablet Take 1 tablet (20 mg total) by mouth daily. Last refill without OV 90  tablet 0  ? Biotin 1 MG CAPS Take by mouth.    ? fluconazole (DIFLUCAN) 150 MG tablet Take 150 mg by mouth once.    ? glucose blood test strip Use as instructed 100 each 12  ? ibuprofen (ADVIL,MOTRIN) 200 MG tablet Take 400 mg by mouth every 6 (six) hours as needed for mild pain or moderate pain. Reported on 07/12/2015    ? insulin glargine (LANTUS) 100 UNIT/ML injection Inject 22 Units into the skin daily.    ? Insulin Pen Needle 32G X 4 MM MISC Use as directed 100 each 1  ? Lancets (ONETOUCH ULTRASOFT) lancets Use as instructed 100 each 12  ? Multiple Vitamin (MULTIVITAMIN WITH MINERALS) TABS tablet Take 1 tablet by mouth daily.    ? nystatin cream (MYCOSTATIN) Apply 1 application topically 2 (two) times daily. 30 g 0  ? nystatin-triamcinolone (MYCOLOG II) cream Apply 1 application topically 2 (two) times daily. 60 g 1  ? triamcinolone cream (KENALOG) 0.1 % APPLY TO AFFECTED AREA TWICE A DAY 30 g 0  ? Vitamin D, Ergocalciferol, (DRISDOL) 1.25 MG (50000 UNIT) CAPS capsule TAKE 1 CAPSULE (50,000 UNITS TOTAL) BY MOUTH EVERY 7 (SEVEN) DAYS 12 capsule 0  ? acyclovir (ZOVIRAX) 400 MG tablet Take 1 tablet (400 mg total) by mouth 3 (three) times daily. 15 tablet 0  ? cephALEXin (KEFLEX) 500 MG capsule Take 1 capsule (500 mg total) by mouth 4 (four) times daily. 28 capsule 0  ? dapagliflozin propanediol (FARXIGA) 5 MG TABS tablet Take 1 tablet (5 mg total) by mouth daily before breakfast. 30 tablet 0  ? phenazopyridine (PYRIDIUM) 200 MG tablet Take 1 tablet (200 mg total) by mouth 3 (three) times daily. 6 tablet 0  ? TRULICITY 1.5 BZ/1.6RC SOPN INJECT WEEKLY AS DIRECTED 6 mL 0  ? Insulin Glargine (BASAGLAR KWIKPEN) 100 UNIT/ML Inject 10 Units into the skin daily. (Patient not taking: Reported on 06/21/2021) 3 mL 2  ? ?No facility-administered medications prior to visit.  ? ? ?Allergies  ?Allergen Reactions  ? Epinephrine Anaphylaxis  ? Lidocaine Anaphylaxis  ? Lisinopril   ?  itching  ? Metformin And Related   ?  GI upset   ? ? ?ROS ?Pertinent positives and negatives in the history of present illness. ? ?   ?Objective:  ?  ?Physical Exam ? ?BP 130/84 (BP Location: Left  Arm, Patient Position: Sitting, Cuff Size: Large)   Pulse 70   Temp 98 ?F (36.7 ?C) (Temporal)   Ht 5' (1.524 m)   Wt 178 lb (80.7 kg)   SpO2 100%   BMI 34.76 kg/m?  ?Wt Readings from Last 3 Encounters:  ?07/05/21 178 lb (80.7 kg)  ?06/21/21 173 lb (78.5 kg)  ?05/25/21 182 lb (82.6 kg)  ? ?Alert and oriented and in no acute distress. Cardiac exam with normal rate. Respirations unlabored. Normal speech, mood and behavior.  ? ?   ?Assessment & Plan:  ? ?Problem List Items Addressed This Visit   ? ?  ? Endocrine  ? Hyperlipidemia associated with type 2 diabetes mellitus ([place])  ?  Continue statin therapy. Check fasting labs at next visit.  ? ?  ?  ? Relevant Medications  ? insulin glargine (LANTUS) 100 UNIT/ML injection  ? Dulaglutide (TRULICITY) 1.5 JH/4.1DE SOPN  ? Uncontrolled type 2 diabetes mellitus with hyperglycemia ([facility]) - Primary  ?  Unfortunately the endocrinology office is backed up with referrals but she does have a visit scheduled for next month. Her blood sugars are improving, down from 400s to 200 range fasting. She is no longer as symptomatic. She is not tolerating Farxiga due to recurrent yeast infections and vaginal itching is severe so we will stop [place]. She will continue Trulicity 1.5 mg weekly, she has been on this medication for the past 4 years. She did not tolerate Metformin in the past.  ?We will titrate her Lantus. Tomorrow she will increase her units to 22 daily and then every 3 days may increase dose by 2 units until her fasting BS is <150 and then she will stay at that dose. She is aware that she may need meal time insulin but I will defer to endocrinology for this decision. She is fine with this plan.  ? ?  ?  ? Relevant Medications  ? insulin glargine (LANTUS) 100 UNIT/ML injection  ? Dulaglutide (TRULICITY) 1.5 YC/1.4GY SOPN  ?  ?  Other  ? Vitamin D deficiency  ?  Continue vitamin D supplement. Recheck vitamin D level at her next visit  ? ?  ?  ? ?Other Visit Diagnoses   ? ? Vaginal itching      ? ?  ?Hopefully the vaginal itching will i

## 2021-07-05 NOTE — Patient Instructions (Signed)
Stop [place].  ? ?Increase your Lantus to 22 units starting tomorrow and for 3 days.  ? ?If your fasting blood sugar is higher than 150 on day 4, increase your Lantus by 2 units (24 units) for the next 3 days. Continue doing this until your blood sugar is less than 150 fasting.  ? ?If your blood sugar is less than 150, do not increase your Lantus dose.  ? ?Continue Trulicity and your other medications.  ? ?See the endocrinologist in June as scheduled.  ?

## 2021-07-05 NOTE — Assessment & Plan Note (Signed)
Unfortunately the endocrinology office is backed up with referrals but she does have a visit scheduled for next month. Her blood sugars are improving, down from 400s to 200 range fasting. She is no longer as symptomatic. She is not tolerating Farxiga due to recurrent yeast infections and vaginal itching is severe so we will stop [place]. She will continue Trulicity 1.5 mg weekly, she has been on this medication for the past 4 years. She did not tolerate Metformin in the past.  ?We will titrate her Lantus. Tomorrow she will increase her units to 22 daily and then every 3 days may increase dose by 2 units until her fasting BS is <150 and then she will stay at that dose. She is aware that she may need meal time insulin but I will defer to endocrinology for this decision. She is fine with this plan.  ?

## 2021-07-05 NOTE — Assessment & Plan Note (Signed)
Continue statin therapy. Check fasting labs at next visit.  ?

## 2021-07-06 ENCOUNTER — Encounter: Payer: No Typology Code available for payment source | Admitting: Nurse Practitioner

## 2021-07-06 NOTE — Telephone Encounter (Signed)
PCP sent on 07/04/21 ?

## 2021-07-10 ENCOUNTER — Telehealth: Payer: Self-pay | Admitting: Family Medicine

## 2021-07-10 NOTE — Telephone Encounter (Signed)
Patient received her trulicity through the mail from [place] - she is returning it because it was $200.00  [facility] [address] - medication is usually only $25.00. ?

## 2021-07-11 MED ORDER — TRULICITY 1.5 MG/0.5ML ~~LOC~~ SOAJ: 1.5000 mg | SUBCUTANEOUS | 0 refills | Status: DC

## 2021-07-11 NOTE — Telephone Encounter (Signed)
Rx resent to pharmacy of choice per patient request. ?

## 2021-07-13 NOTE — Telephone Encounter (Signed)
PT calls today and has noted that [facility] [address] states they have not received the prescription for Dulaglutide (TRULICITY). I had let her know that it had been resent on 9th and that we would follow back up on that.  ? ?CB: [phone] ?

## 2021-07-13 NOTE — Telephone Encounter (Signed)
1st- called pharmacy to confirm if they received prescription. They confirmed they did receive it however they could not fill as the insurance charged her for the [facility].  ? ?2nd-  called the pt to confirm that she returned the prescription. Pt confirmed she sent it back on the 8th and is not sure why it shows it is still charging her. I provided [facility] number to her and pt will be calling to see when they will be crediting that medication back so that she is able to get the [facility] [address].  ?

## 2021-07-17 ENCOUNTER — Telehealth: Payer: Self-pay | Admitting: Family Medicine

## 2021-07-17 NOTE — Telephone Encounter (Signed)
Patient called stating that she would like to have another epidural injection done if possible. ?Can this be put in for her or does patient need a visit? ? ?Please advise. ?

## 2021-07-18 ENCOUNTER — Other Ambulatory Visit: Payer: Self-pay | Admitting: Family Medicine

## 2021-07-18 DIAGNOSIS — G8929 Other chronic pain: Secondary | ICD-10-CM

## 2021-07-18 NOTE — Telephone Encounter (Signed)
Epidural ordered. Patient notified through [place] ?

## 2021-07-18 NOTE — Telephone Encounter (Signed)
Lets order one more but would need to see me 6-8 weeks after injection to check in  ?

## 2021-07-24 ENCOUNTER — Other Ambulatory Visit: Payer: Self-pay | Admitting: Family Medicine

## 2021-07-24 ENCOUNTER — Telehealth: Payer: Self-pay

## 2021-07-24 NOTE — Telephone Encounter (Signed)
Do you have a list of diabetic eye doctors we can send to the patient?

## 2021-07-24 NOTE — Telephone Encounter (Signed)
Pt is requesting a referral to see an Diabetic Eye Doctor.  Pt states she is seeing burst of lights.  Please advise

## 2021-07-25 NOTE — Telephone Encounter (Signed)
Here is a list of Eye Doctors that you call and schedule an appointment with.   [facility] Address: [address], [place], [place] [address] Phone: [phone]   [facility] Address: [address] [place], [place], [place] [address] Phone: [phone]  Dr. Katy Fitch Address: [address] [place], [place], [place] [address] Phone: [phone]

## 2021-07-25 NOTE — Telephone Encounter (Signed)
Sent pt message via MyChart with list of eye doctors

## 2021-07-27 ENCOUNTER — Ambulatory Visit
Admission: RE | Admit: 2021-07-27 | Discharge: 2021-07-27 | Disposition: A | Discharge status: Home / Self Care | Payer: No Typology Code available for payment source | Source: Ambulatory Visit | Attending: Family Medicine | Admitting: Family Medicine

## 2021-07-27 DIAGNOSIS — M545 Low back pain, unspecified: Secondary | ICD-10-CM

## 2021-07-27 IMAGING — XA Imaging study
2 series · 2 of 2 positions shown · non-contrast
Comparison: none

CLINICAL DATA: Lumbosacral spondylosis without myelopathy. Low back
and rib pain. 100% relief after L5-S1 interlaminar epidural
injection last [REDACTED]. Symptoms have recurred as of 3 weeks ago.

[Series 1: ortho standard · 1 of 1 slices shown (1 of 2)]
[im 1/1]
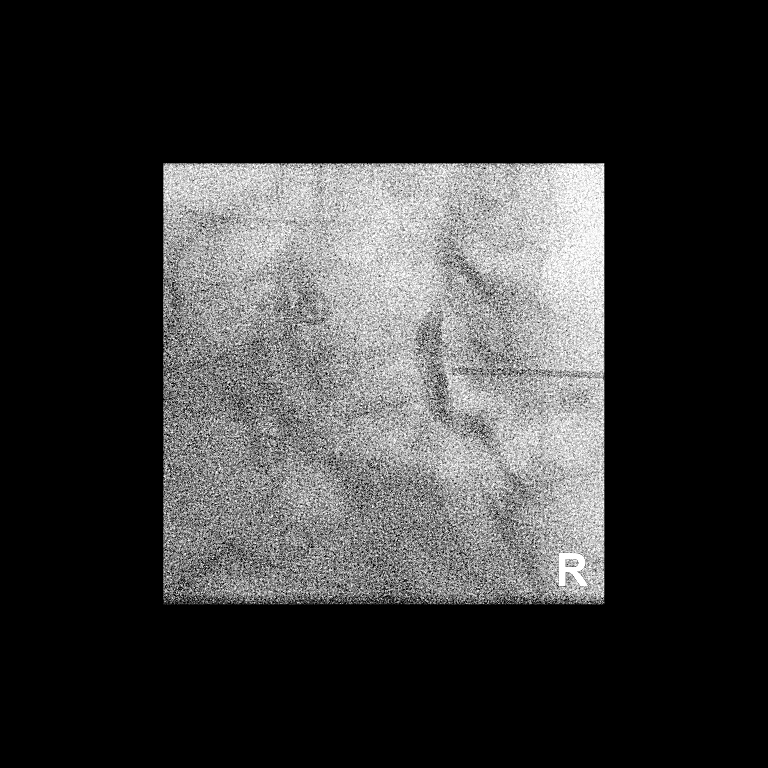

[Series 2: ortho standard · 1 of 1 slices shown (2 of 2)]
[im 1/1]
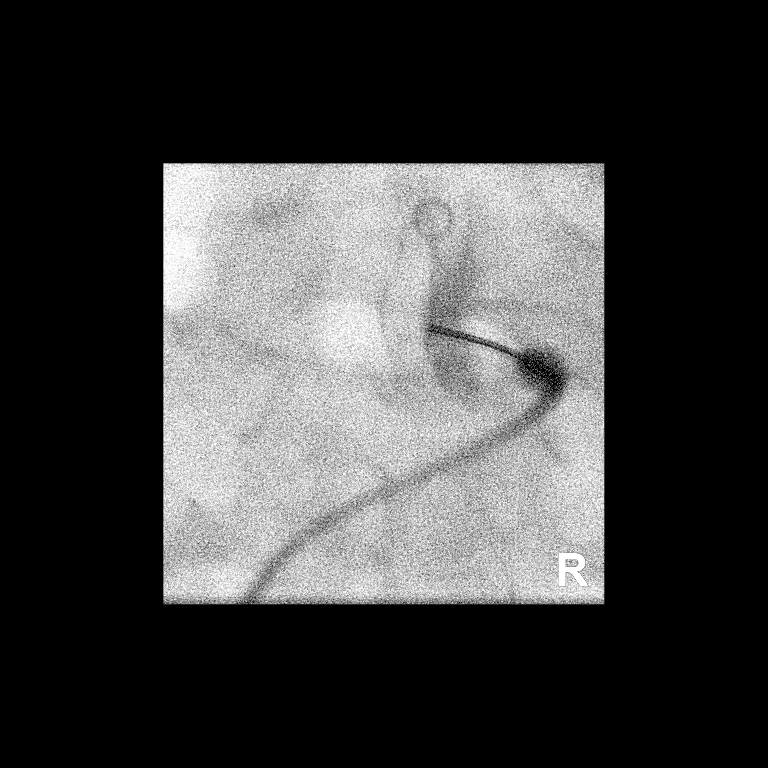

[2 of 2 positions shown; findings below may reference images not displayed]

FLUOROSCOPY:
Radiation Exposure Index (as provided by the fluoroscopic device):
1.7 mGy Kerma

PROCEDURE:
The procedure, risks, benefits, and alternatives were explained to
the patient. Questions regarding the procedure were encouraged and
answered. The patient understands and consents to the procedure.

LUMBAR EPIDURAL INJECTION:

An interlaminar approach was performed on right at L5-S1. The
overlying skin was cleansed and anesthetized. A 3.5 inch 20 gauge
epidural needle was advanced using loss-of-resistance technique.

DIAGNOSTIC EPIDURAL INJECTION:

Injection of Isovue-M 200 shows a good epidural pattern with spread
above and below the level of needle placement, primarily on the
right. No vascular opacification is seen.

THERAPEUTIC EPIDURAL INJECTION:

80 mg of Depo-Medrol mixed with 2 mL of 1% tetracaine were
instilled. The procedure was well-tolerated, and the patient was
discharged thirty minutes following the injection in good condition.

COMPLICATIONS:
None immediate.
IMPRESSION: Technically successful interlaminar epidural injection on the right
at L5-S1.

## 2021-07-27 MED ORDER — METHYLPREDNISOLONE ACETATE 40 MG/ML INJ SUSP (RADIOLOG
80.0000 mg | Freq: Once | INTRAMUSCULAR | Status: AC
  Administered 2021-07-27: 80 mg via EPIDURAL

## 2021-07-27 MED ORDER — IOPAMIDOL (ISOVUE-M 200) INJECTION 41%
1.0000 mL | Freq: Once | INTRAMUSCULAR | Status: AC
  Administered 2021-07-27: 1 mL via EPIDURAL

## 2021-07-27 NOTE — Discharge Instructions (Signed)

## 2021-07-31 ENCOUNTER — Other Ambulatory Visit: Payer: Self-pay | Admitting: Family Medicine

## 2021-07-31 DIAGNOSIS — E1165 Type 2 diabetes mellitus with hyperglycemia: Secondary | ICD-10-CM

## 2021-08-10 ENCOUNTER — Other Ambulatory Visit: Payer: Self-pay

## 2021-08-10 ENCOUNTER — Telehealth: Payer: Self-pay | Admitting: Family Medicine

## 2021-08-10 MED ORDER — FLUCONAZOLE 150 MG PO TABS: 150.0000 mg | ORAL_TABLET | Freq: Once | ORAL | 0 refills | Status: AC

## 2021-08-10 NOTE — Addendum Note (Signed)
Addended by: Shirlyn Goltz on: 08/10/2021 04:21 PM   Modules accepted: Orders

## 2021-08-10 NOTE — Telephone Encounter (Signed)
Pt is requesting Diflucan be sent in for her as she believes she has another yeast infection. Ok to send in or would you like pt to come in for appt?

## 2021-08-10 NOTE — Telephone Encounter (Signed)
Patient needs a diflucan for a yeast infection.

## 2021-08-15 ENCOUNTER — Telehealth: Payer: Self-pay | Admitting: Family Medicine

## 2021-08-15 DIAGNOSIS — R350 Frequency of micturition: Secondary | ICD-10-CM

## 2021-08-15 NOTE — Telephone Encounter (Signed)
Called pt to see if she was able to pick up the prescription that was sent in 5 days ago. Pt states she did pick it up and took the medication, however it did not relieve her symptoms. She states she is still having the itching sensation and frequent urination but denies any dysuria or urinary retention. Pt verbalized frustration as she is only getting 1 pill at a time and the 1 pill does not seem to do the job as she is having to keep calling for more medication and having to wait for it to be filled while dealing with symptoms. I explained to pt this is how this medication is prescribed as it is an antibiotic and this is to prevent the body from building up a resistance. Patient is requesting another prescription be sent into her pharmacy.

## 2021-08-15 NOTE — Telephone Encounter (Signed)
Pt called in and states she needs a rx for Fluconazole.   States she is having an UTI or yeast infection.   [facility]  [id number]  [phone] Fax:  [phone]

## 2021-08-16 ENCOUNTER — Telehealth: Payer: Self-pay | Admitting: Family Medicine

## 2021-08-16 NOTE — Addendum Note (Signed)
Addended by: Rossie Muskrat on: 08/16/2021 05:11 PM   Modules accepted: Orders

## 2021-08-16 NOTE — Telephone Encounter (Signed)
Pt called in and wants to know if husband can bring in urine specimen: informed patient,that lab orders have not been entered ,and assistant will reach out once lab orders have been entered

## 2021-08-16 NOTE — Telephone Encounter (Signed)
Called pt to let her know Vickie would like for her to give another urine sample as the fluconazole has not been successful. Pt verbalized frustration as she is having to pay multiple medical bills and copays and states that is where all of her money is going on top of the fact she may be getting laid off soon. Pt states she is unable to come into the office today and is only available after 4 tomorrow since that is when she gets off work and cannot afford to take anymore time off. I offered if it would be more financially beneficial for her we can collect a urine sample from the lab and just do a VV?   -patient received a call from boss so was unable to get answer on scheduling her for either OV or VV but will need urine collected either from lab or in office. Pt states she will call back.

## 2021-08-16 NOTE — Telephone Encounter (Signed)
Pt called back and would like to drop off urine specimen for lab collect so that she can do a VV with Judson Roch as all the other providers do not have anything after 4 pm.   Would you like Urinalysis order placed?

## 2021-08-17 ENCOUNTER — Encounter: Payer: Self-pay | Admitting: Nurse Practitioner

## 2021-08-17 ENCOUNTER — Other Ambulatory Visit (INDEPENDENT_AMBULATORY_CARE_PROVIDER_SITE_OTHER): Payer: No Typology Code available for payment source

## 2021-08-17 ENCOUNTER — Telehealth (INDEPENDENT_AMBULATORY_CARE_PROVIDER_SITE_OTHER): Payer: No Typology Code available for payment source | Admitting: Nurse Practitioner

## 2021-08-17 DIAGNOSIS — B3731 Acute candidiasis of vulva and vagina: Secondary | ICD-10-CM

## 2021-08-17 DIAGNOSIS — R350 Frequency of micturition: Secondary | ICD-10-CM

## 2021-08-17 DIAGNOSIS — E1165 Type 2 diabetes mellitus with hyperglycemia: Secondary | ICD-10-CM

## 2021-08-17 LAB — URINALYSIS, ROUTINE W REFLEX MICROSCOPIC
Bilirubin Urine: NEGATIVE
Ketones, ur: NEGATIVE
Leukocytes,Ua: NEGATIVE
Nitrite: NEGATIVE
RBC / HPF: NONE SEEN (ref 0–?)
Specific Gravity, Urine: 1.02 (ref 1.000–1.030)
Total Protein, Urine: NEGATIVE
Urine Glucose: >=1000 — AB
Urobilinogen, UA: 0.2 (ref 0.0–1.0)
pH: 6 (ref 5.0–8.0)

## 2021-08-17 MED ORDER — FLUCONAZOLE 150 MG PO TABS: 150.0000 mg | ORAL_TABLET | Freq: Once | ORAL | 0 refills | Status: AC

## 2021-08-17 MED ORDER — METFORMIN HCL 1000 MG PO TABS: 500.0000 mg | ORAL_TABLET | Freq: Two times a day (BID) | ORAL | 3 refills | Status: DC

## 2021-08-17 NOTE — Telephone Encounter (Signed)
Orders placed. Pt states her husband will drop off urine sample today.

## 2021-08-17 NOTE — Assessment & Plan Note (Signed)
Symptoms consistent with vaginal yeast infection.  Will order Diflucan 150 mg by mouth once, and await urine culture results for further evaluation and possible adjustments to treatment plan.

## 2021-08-17 NOTE — Progress Notes (Signed)
Established Patient Office Visit  An audio-only tele-health visit was completed today for this patient. I connected with  [name] [name] on 08/17/21 utilizing audio-only technology and verified that I am speaking with the correct person using two identifiers. The patient was located at their place of employment, and I was located at the office of [facility] at [facility] during the encounter. I discussed the limitations of evaluation and management by telemedicine. The patient expressed understanding and agreed to proceed.    Subjective   Patient ID: [name] [name], female    DOB: 07-Feb-1961  Age: 62 y.o. MRN: [id number]  Chief Complaint  Patient presents with   Vaginitis   Patient arrives for virtual visit for the above.  She tells me she has symptoms similar to vaginal yeast infection which she has had in the past.  She reports vaginal itching, and irritation, with urinary frequency but denies dysuria.  She did drop off urine sample earlier today which shows bacteria and white blood cell count, urine culture still pending.    She also reports that she would like to discuss adjusting her diabetic medications.  She has taken metformin in the past with negative side effects which resulted in her stopping the metformin.  She tells me that she thinks high intake of grapefruit was contributing and she has now stopped eating grapefruit.  She would like to restart the metformin if possible.  She was taking Lantus but due to financial barriers she has stopped this.  She also continues on Trulicity and is tolerating this well.  She reports that she is going to lose her insurance in approximately 2 months which is another motivator for her starting metformin as it is more affordable.  Per chart review last A1c was 13.1.    Review of Systems  Constitutional:  Negative for chills, fever and malaise/fatigue.  Genitourinary:  Positive for frequency and urgency. Negative for dysuria.        (+) itching, (-) vaginal discharge      Objective:     There were no vitals taken for this visit. BP Readings from Last 3 Encounters:  07/27/21 (!) 152/91  07/05/21 130/84  06/21/21 112/76   Wt Readings from Last 3 Encounters:  07/05/21 178 lb (80.7 kg)  06/21/21 173 lb (78.5 kg)  05/25/21 182 lb (82.6 kg)      Physical Exam Comprehensive physical exam not completed today as office visit was conducted remotely.  Patient sounded well over the phone.  Patient was alert and oriented, and appeared to have appropriate judgment.   Results for orders placed or performed in visit on 08/17/21  Urinalysis, Routine w reflex microscopic  Result Value Ref Range   Color, Urine YELLOW Yellow;Lt. Yellow;Straw;Dark Yellow;Amber;Green;Red;Brown   APPearance Sl Cloudy (A) Clear;Turbid;Slightly Cloudy;Cloudy   Specific Gravity, Urine 1.020 1.000 - 1.030   pH 6.0 5.0 - 8.0   Total Protein, Urine NEGATIVE Negative   Urine Glucose >=1000 (A) Negative   Ketones, ur NEGATIVE Negative   Bilirubin Urine NEGATIVE Negative   Hgb urine dipstick TRACE-LYSED (A) Negative   Urobilinogen, UA 0.2 0.0 - 1.0   Leukocytes,Ua NEGATIVE Negative   Nitrite NEGATIVE Negative   WBC, UA 11-20/hpf (A) 0-2/hpf   RBC / HPF none seen 0-2/hpf   Mucus, UA Presence of (A) None   Squamous Epithelial / LPF Few(5-10/hpf) (A) Rare(0-4/hpf)   Bacteria, UA Rare(<10/hpf) (A) None      The ASCVD Risk score (Arnett DK, et al.,  2019) failed to calculate for the following reasons:   The systolic blood pressure is missing    Assessment & Plan:   Problem List Items Addressed This Visit       Endocrine   Uncontrolled type 2 diabetes mellitus with hyperglycemia ([place])    Patient plans to continue on Trulicity 1.5 mg weekly and has stopped her Lantus.  She would like to try metformin again and see if he tolerates it better now.  Prescription for 500 mg by mouth twice a day sent to patient's pharmacy.  She will follow-up in  about 6 weeks with PCP to see how she is tolerating the metformin and see if her blood sugars have responded.  She was encouraged to reach out with any questions or concerns between now and then.      Relevant Medications   metFORMIN (GLUCOPHAGE) 1000 MG tablet     Genitourinary   Vaginal yeast infection - Primary    Symptoms consistent with vaginal yeast infection.  Will order Diflucan 150 mg by mouth once, and await urine culture results for further evaluation and possible adjustments to treatment plan.      Relevant Medications   fluconazole (DIFLUCAN) 150 MG tablet    Return in about 6 weeks (around 09/28/2021).   Total time spent the telephone today was 14 minutes and 34 seconds.  Ailene Ards, NP

## 2021-08-17 NOTE — Assessment & Plan Note (Signed)
Patient plans to continue on Trulicity 1.5 mg weekly and has stopped her Lantus.  She would like to try metformin again and see if he tolerates it better now.  Prescription for 500 mg by mouth twice a day sent to patient's pharmacy.  She will follow-up in about 6 weeks with PCP to see how she is tolerating the metformin and see if her blood sugars have responded.  She was encouraged to reach out with any questions or concerns between now and then.

## 2021-08-18 LAB — URINE CULTURE: Result:: NO GROWTH

## 2021-09-08 ENCOUNTER — Other Ambulatory Visit: Payer: Self-pay | Admitting: Family

## 2021-09-14 ENCOUNTER — Ambulatory Visit: Payer: No Typology Code available for payment source | Admitting: Family Medicine

## 2021-09-14 ENCOUNTER — Encounter: Payer: Self-pay | Admitting: Family Medicine

## 2021-09-14 VITALS — BP 146/86 | HR 75 | Temp 97.6°F | Ht 60.0 in | Wt 183.0 lb

## 2021-09-14 DIAGNOSIS — E162 Hypoglycemia, unspecified: Secondary | ICD-10-CM | POA: Diagnosis not present

## 2021-09-14 DIAGNOSIS — E785 Hyperlipidemia, unspecified: Secondary | ICD-10-CM

## 2021-09-14 DIAGNOSIS — E1169 Type 2 diabetes mellitus with other specified complication: Secondary | ICD-10-CM

## 2021-09-14 DIAGNOSIS — E1165 Type 2 diabetes mellitus with hyperglycemia: Secondary | ICD-10-CM

## 2021-09-14 NOTE — Assessment & Plan Note (Signed)
Continue statin. 

## 2021-09-14 NOTE — Assessment & Plan Note (Signed)
DM now being managed by endocrinologist.

## 2021-09-14 NOTE — Patient Instructions (Signed)
Please call your endocrinologist regarding your low blood sugars.

## 2021-09-14 NOTE — Progress Notes (Signed)
Subjective:     Patient ID: [name] [name], female    DOB: 1960/07/15, 61 y.o.   MRN: [id number]  Chief Complaint  Patient presents with   Follow-up    States she has been doing very well with the metformin, now has Freestyle Libre 3. Blood Sugars last week were going low 69-79 but states all she has to do is eat something and it goes back to normal.    HPI Patient is in today for follow up on diabetes. She was able to start seeing endocrinologist since our last visit.  She has a new medication regimen and reports 2 low BS readings recently of 67. States she ate a piece of candy and BS returned to normal.  She is wearing a freestyle [place]  States she is taking a statin without any issues.   No other concerns today.      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   Change in bowel movement    Colon cancer ([place])    Complication of anesthesia    cardiac arrest during nasal surgery   Diabetes mellitus without complication ([place])    Headache    History of rectal bleeding     Past Surgical History:  Procedure Laterality Date   CESAREAN SECTION     colon cancer     COLON SURGERY     COLONOSCOPY     LAPAROSCOPIC LYSIS OF ADHESIONS  04/02/2014   Procedure: LAPAROSCOPIC LYSIS OF ADHESIONS;  Surgeon: Jackolyn Confer, MD;  Location: [facility];  Service: General;;   NOSE SURGERY     POLYPECTOMY     VENTRAL HERNIA REPAIR N/A 04/02/2014   Procedure: LAPAROSCOPIC  LYSIS OF ADHESIONS AND REPAIR OF VENTRAL INCISIONAL HERNIAS WITH MESH;  Surgeon: Jackolyn Confer, MD;  Location: [facility];   Service: General;  Laterality: N/A;    Family History  Problem Relation Age of Onset   Uterine cancer Mother    Colon cancer Maternal Aunt    Breast cancer Paternal Aunt    Colon cancer Maternal Grandmother    Breast cancer Cousin    Breast cancer Cousin    Diabetes Neg Hx    Rectal cancer Neg Hx    Stomach cancer Neg Hx     Social History   Socioeconomic History   Marital status: Married    Spouse name: Not on file   Number of children: 3   Years of education: 12   Highest education level: Not on file  Occupational History   Occupation: Customer Service  Tobacco Use   Smoking status: Former    Packs/day: 0.25    Years: 13.00    Total pack years: 3.25    Types: Cigarettes    Quit date: 03/14/2003    Years since quitting: 18.5   Smokeless tobacco: Never  Vaping Use   Vaping Use: Some days   Substances: Flavoring   Devices: flavored , non-nicotine  Substance and Sexual Activity  Alcohol use: Yes    Alcohol/week: 0.0 standard drinks of alcohol    Comment: occasional    Drug use: No   Sexual activity: Not Currently  Other Topics Concern   Not on file  Social History Narrative   Fun: Camping, Cruise    Denies abuse and feels safe at home.    Social Determinants of Health   Financial Resource Strain: Not on file  Food Insecurity: Not on file  Transportation Needs: Not on file  Physical Activity: Not on file  Stress: Not on file  Social Connections: Not on file  Intimate Partner Violence: Not on file    Outpatient Medications Prior to Visit  Medication Sig Dispense Refill   atorvastatin (LIPITOR) 20 MG tablet Take 1 tablet (20 mg total) by mouth daily. Last refill without OV 90 tablet 0   Biotin 1 MG CAPS Take by mouth.     Continuous Blood Gluc Sensor (FREESTYLE LIBRE 3 SENSOR) MISC SMARTSIG:1 Topical Every 2 Weeks     Dulaglutide (TRULICITY) 1.5 EX/9.3ZJ SOPN Inject 1.5 mg into the skin once a week. 6 mL 0   glucose blood test strip Use as instructed 100  each 12   ibuprofen (ADVIL,MOTRIN) 200 MG tablet Take 400 mg by mouth every 6 (six) hours as needed for mild pain or moderate pain. Reported on 07/12/2015     insulin degludec (TRESIBA) 200 UNIT/ML FlexTouch Pen Inject 20 Units into the skin daily.     Insulin Pen Needle 32G X 4 MM MISC Use as directed 100 each 1   Lancets (ONETOUCH ULTRASOFT) lancets Use as instructed 100 each 12   metFORMIN (GLUCOPHAGE) 1000 MG tablet Take 0.5 tablets (500 mg total) by mouth 2 (two) times daily with a meal. 90 tablet 1   Multiple Vitamin (MULTIVITAMIN WITH MINERALS) TABS tablet Take 1 tablet by mouth daily.     nystatin cream (MYCOSTATIN) Apply 1 application topically 2 (two) times daily. 30 g 0   triamcinolone cream (KENALOG) 0.1 % APPLY TO AFFECTED AREA TWICE A DAY 30 g 0   Vitamin D, Ergocalciferol, (DRISDOL) 1.25 MG (50000 UNIT) CAPS capsule TAKE 1 CAPSULE (50,000 UNITS TOTAL) BY MOUTH EVERY 7 (SEVEN) DAYS 12 capsule 0   TRULICITY 3 IR/6.7EL SOPN SMARTSIG:0.5 Milliliter(s) SUB-Q Once a Week (Patient not taking: Reported on 09/14/2021)     No facility-administered medications prior to visit.    Allergies  Allergen Reactions   Epinephrine Anaphylaxis   Lidocaine Anaphylaxis   Lisinopril     itching   Metformin And Related     GI upset    ROS     Objective:    Physical Exam  BP (!) 146/86 (BP Location: Right Arm, Patient Position: Sitting, Cuff Size: Large)   Pulse 75   Temp 97.6 F (36.4 C) (Temporal)   Ht 5' (1.524 m)   Wt 183 lb (83 kg)   SpO2 99%   BMI 35.74 kg/m  Wt Readings from Last 3 Encounters:  09/14/21 183 lb (83 kg)  07/05/21 178 lb (80.7 kg)  06/21/21 173 lb (78.5 kg)       Assessment & Plan:   Problem List Items Addressed This Visit       Endocrine   Hyperlipidemia associated with type 2 diabetes mellitus (HCC) - Primary    Continue statin      Relevant Medications   insulin degludec (TRESIBA) 200 UNIT/ML FlexTouch Pen   TRULICITY 3 FY/1.0FB SOPN    Hypoglycemia  Advised her to continue to monitor BS closely. She has continuous monitoring with freestyle libre fortunately. Consider reducing basal insulin from 20 units to 15 units and call endocrinologist. Recommend she hold off on higher dose of Trulicity until she consults with endocrinologist.       Uncontrolled type 2 diabetes mellitus with hyperglycemia ([place])    DM now being managed by endocrinologist.       Relevant Medications   insulin degludec (TRESIBA) 200 UNIT/ML FlexTouch Pen   TRULICITY 3 HT/0.9PJ SOPN    I am having [name] [name] maintain her ibuprofen, glucose blood, onetouch ultrasoft, Biotin, multivitamin with minerals, nystatin cream, Vitamin D (Ergocalciferol), triamcinolone cream, atorvastatin, Insulin Pen Needle, Trulicity, metFORMIN, FreeStyle Libre 3 Sensor, insulin degludec, and Trulicity.  No orders of the defined types were placed in this encounter.

## 2021-09-14 NOTE — Assessment & Plan Note (Signed)
Advised her to continue to monitor BS closely. She has continuous monitoring with freestyle libre fortunately. Consider reducing basal insulin from 20 units to 15 units and call endocrinologist. Recommend she hold off on higher dose of Trulicity until she consults with endocrinologist.

## 2021-09-28 ENCOUNTER — Other Ambulatory Visit: Payer: Self-pay | Admitting: Family

## 2021-09-29 ENCOUNTER — Other Ambulatory Visit: Payer: Self-pay

## 2021-09-29 ENCOUNTER — Telehealth: Payer: Self-pay | Admitting: Family Medicine

## 2021-09-29 ENCOUNTER — Other Ambulatory Visit: Payer: Self-pay | Admitting: Family

## 2021-09-29 MED ORDER — ATORVASTATIN CALCIUM 20 MG PO TABS: 20.0000 mg | ORAL_TABLET | Freq: Every day | ORAL | 0 refills | Status: DC

## 2021-09-29 MED ORDER — FREESTYLE LIBRE 3 SENSOR MISC
3 refills | Status: DC
Start: 2021-09-29 — End: 2023-05-13

## 2021-09-29 NOTE — Telephone Encounter (Signed)
Caller & Relationship to patient: [name] [name]  Call back number: [phone]  Date of last office visit: 09/14/21  Date of next office visit:   Medication(s) to be refilled:  atorvastatin (LIPITOR) 20 MG tablet  Continuous Blood Gluc Sensor (FREESTYLE LIBRE 3 SENSOR) MISC   Preferred Pharmacy:  [id number] [place] [place]:  [phone] Fax:  [phone]

## 2021-10-11 ENCOUNTER — Other Ambulatory Visit: Payer: Self-pay | Admitting: Family Medicine

## 2021-11-01 ENCOUNTER — Other Ambulatory Visit: Payer: Self-pay | Admitting: Internal Medicine

## 2021-11-01 NOTE — Telephone Encounter (Signed)
To pcp please 

## 2021-11-02 NOTE — Telephone Encounter (Signed)
Called pt and she states she lost her job and no longer has insurance so she is unable to come in at this time.

## 2021-12-07 ENCOUNTER — Other Ambulatory Visit: Payer: Self-pay | Admitting: Family Medicine

## 2021-12-07 ENCOUNTER — Ambulatory Visit: Payer: Commercial Managed Care - HMO | Admitting: Family Medicine

## 2021-12-07 ENCOUNTER — Encounter: Payer: Self-pay | Admitting: Family Medicine

## 2021-12-07 VITALS — BP 148/84 | HR 75 | Temp 97.6°F | Ht 60.0 in | Wt 189.0 lb

## 2021-12-07 DIAGNOSIS — E1169 Type 2 diabetes mellitus with other specified complication: Secondary | ICD-10-CM | POA: Diagnosis not present

## 2021-12-07 DIAGNOSIS — E1165 Type 2 diabetes mellitus with hyperglycemia: Secondary | ICD-10-CM

## 2021-12-07 DIAGNOSIS — E785 Hyperlipidemia, unspecified: Secondary | ICD-10-CM | POA: Diagnosis not present

## 2021-12-07 LAB — COMPREHENSIVE METABOLIC PANEL
ALT: 11 U/L (ref 0–35)
AST: 13 U/L (ref 0–37)
Albumin: 3.9 g/dL (ref 3.5–5.2)
Alkaline Phosphatase: 68 U/L (ref 39–117)
BUN: 10 mg/dL (ref 6–23)
CO2: 27 mEq/L (ref 19–32)
Calcium: 8.8 mg/dL (ref 8.4–10.5)
Chloride: 106 mEq/L (ref 96–112)
Creatinine, Ser: 0.86 mg/dL (ref 0.40–1.20)
GFR: 72.23 mL/min (ref >60.00)
Glucose, Bld: 183 mg/dL — ABNORMAL HIGH (ref 70–99)
Potassium: 3.9 mEq/L (ref 3.5–5.1)
Sodium: 139 mEq/L (ref 135–145)
Total Bilirubin: 0.4 mg/dL (ref 0.2–1.2)
Total Protein: 6.6 g/dL (ref 6.0–8.3)

## 2021-12-07 LAB — LIPID PANEL
Cholesterol: 143 mg/dL (ref 0–200)
HDL: 37.6 mg/dL — ABNORMAL LOW (ref >39.00)
LDL Cholesterol: 82 mg/dL (ref 0–99)
NonHDL: 105.68
Total CHOL/HDL Ratio: 4
Triglycerides: 120 mg/dL (ref 0.0–149.0)
VLDL: 24 mg/dL (ref 0.0–40.0)

## 2021-12-07 MED ORDER — DEXCOM G7 SENSOR MISC: 6 refills | Status: DC

## 2021-12-07 MED ORDER — ATORVASTATIN CALCIUM 20 MG PO TABS: 20.0000 mg | ORAL_TABLET | Freq: Every day | ORAL | 1 refills | Status: DC

## 2021-12-07 NOTE — Patient Instructions (Addendum)
Please go downstairs for labs before you leave today.   I will refill your cholesterol medication once I have these results.   Please call and schedule with your endocrinologist for your diabetes.

## 2021-12-07 NOTE — Progress Notes (Signed)
Subjective:     Patient ID: [name] [name], female    DOB: 06-27-1960, 61 y.o.   MRN: [id number]  Chief Complaint  Patient presents with   Follow-up    Would like to discuss switching to jardiance. Also needs atorvastatin refill.     HPI Patient is in today for refill of atorvastatin. No lipid panel in over a year.   She is under the care of endocrinologist now at [facility].   She has not followed up there but plans to call and schedule a visit.   Denies fever, chills, dizziness, chest pain, palpitations, shortness of breath, abdominal pain, N/V/D, urinary symptoms, LE edema.    Health Maintenance Due  Topic Date Due   Zoster Vaccines- Shingrix (1 of 2) Never done   FOOT EXAM  01/05/2020   OPHTHALMOLOGY EXAM  01/17/2021   Diabetic kidney evaluation - Urine ACR  04/19/2021   PAP SMEAR-Modifier  10/05/2021   HEMOGLOBIN A1C  11/25/2021    Past Medical History:  Diagnosis Date   Abdominal hernia    4 hernias per pt   Abdominal pain    mid abd pain radiating to right side   Anemia    Cancer ([place])    rectal ca   Change in bowel movement    Colon cancer ([place])    Complication of anesthesia    cardiac arrest during nasal surgery   Diabetes mellitus without complication ([place])    Headache    History of rectal bleeding     Past Surgical History:  Procedure Laterality Date   CESAREAN SECTION     colon cancer     COLON SURGERY     COLONOSCOPY     LAPAROSCOPIC LYSIS OF ADHESIONS  04/02/2014   Procedure: LAPAROSCOPIC LYSIS OF ADHESIONS;  Surgeon: Jackolyn Confer, MD;  Location[facility];  Service: General;;   NOSE SURGERY     POLYPECTOMY     VENTRAL HERNIA REPAIR N/A 04/02/2014   Procedure: LAPAROSCOPIC  LYSIS OF ADHESIONS AND REPAIR OF VENTRAL INCISIONAL HERNIAS WITH MESH;  Surgeon: Jackolyn Confer, MD;  Location[facility];  Service: General;  Laterality: N/A;    Family History  Problem Relation Age of Onset   Uterine cancer Mother    Colon cancer Maternal  Aunt    Breast cancer Paternal Aunt    Colon cancer Maternal Grandmother    Breast cancer Cousin    Breast cancer Cousin    Diabetes Neg Hx    Rectal cancer Neg Hx    Stomach cancer Neg Hx     Social History   Socioeconomic History   Marital status: Married    Spouse name: Not on file   Number of children: 3   Years of education: 12   Highest education level: Not on file  Occupational History   Occupation: Customer Service  Tobacco Use   Smoking status: Former    Packs/day: 0.25    Years: 13.00    Total pack years: 3.25    Types: Cigarettes    Quit date: 03/14/2003    Years since quitting: 18.7   Smokeless tobacco: Never  Vaping Use   Vaping Use: Some days   Substances: Flavoring   Devices: flavored , non-nicotine  Substance and Sexual Activity   Alcohol use: Yes    Alcohol/week: 0.0 standard drinks of alcohol    Comment: occasional    Drug use: No   Sexual activity: Not Currently  Other Topics Concern  Not on file  Social History Narrative   Fun: Camping, Cruise    Denies abuse and feels safe at home.    Social Determinants of Health   Financial Resource Strain: Not on file  Food Insecurity: Not on file  Transportation Needs: Not on file  Physical Activity: Not on file  Stress: Not on file  Social Connections: Not on file  Intimate Partner Violence: Not on file    Outpatient Medications Prior to Visit  Medication Sig Dispense Refill   atorvastatin (LIPITOR) 20 MG tablet Take 1 tablet (20 mg total) by mouth daily. Last refill without OV 90 tablet 0   Biotin 1 MG CAPS Take by mouth.     Continuous Blood Gluc Sensor (FREESTYLE LIBRE 3 SENSOR) MISC SMARTSIG:1 Topical Every 2 Weeks 6 each 3   Dulaglutide (TRULICITY) 1.5 YI/9.4WN SOPN INJECT 1.5 MG INTO THE SKIN ONCE A WEEK. 4 mL 1   glucose blood test strip Use as instructed 100 each 12   ibuprofen (ADVIL,MOTRIN) 200 MG tablet Take 400 mg by mouth every 6 (six) hours as needed for mild pain or moderate pain.  Reported on 07/12/2015     insulin degludec (TRESIBA) 200 UNIT/ML FlexTouch Pen Inject 20 Units into the skin daily.     Insulin Pen Needle 32G X 4 MM MISC Use as directed 100 each 1   Lancets (ONETOUCH ULTRASOFT) lancets Use as instructed 100 each 12   metFORMIN (GLUCOPHAGE) 1000 MG tablet Take 0.5 tablets (500 mg total) by mouth 2 (two) times daily with a meal. 90 tablet 1   Multiple Vitamin (MULTIVITAMIN WITH MINERALS) TABS tablet Take 1 tablet by mouth daily.     nystatin cream (MYCOSTATIN) Apply 1 application topically 2 (two) times daily. 30 g 0   triamcinolone cream (KENALOG) 0.1 % APPLY TO AFFECTED AREA TWICE A DAY 30 g 0   TRULICITY 3 IO/2.7OJ SOPN      Vitamin D, Ergocalciferol, (DRISDOL) 1.25 MG (50000 UNIT) CAPS capsule TAKE 1 CAPSULE (50,000 UNITS TOTAL) BY MOUTH EVERY 7 (SEVEN) DAYS (Patient not taking: Reported on 12/07/2021) 12 capsule 0   No facility-administered medications prior to visit.    Allergies  Allergen Reactions   Epinephrine Anaphylaxis   Lidocaine Anaphylaxis   Lisinopril     itching   Metformin And Related     GI upset    ROS     Objective:    Physical Exam Constitutional:      General: She is not in acute distress.    Appearance: She is not ill-appearing.  Cardiovascular:     Rate and Rhythm: Normal rate.  Pulmonary:     Effort: Pulmonary effort is normal.  Musculoskeletal:     Right lower leg: No edema.     Left lower leg: No edema.  Neurological:     General: No focal deficit present.     Mental Status: She is alert and oriented to person, place, and time.  Psychiatric:        Mood and Affect: Mood normal.        Behavior: Behavior normal.        Thought Content: Thought content normal.     BP (!) 148/84 (BP Location: Right Arm, Patient Position: Sitting, Cuff Size: Large)   Pulse 75   Temp 97.6 F (36.4 C) (Temporal)   Ht 5' (1.524 m)   Wt 189 lb (85.7 kg)   SpO2 98%   BMI 36.91 kg/m  Wt Readings from  Last 3 Encounters:   12/07/21 189 lb (85.7 kg)  09/14/21 183 lb (83 kg)  07/05/21 178 lb (80.7 kg)       Assessment & Plan:   Problem List Items Addressed This Visit       Endocrine   Hyperlipidemia associated with type 2 diabetes mellitus ([place]) - Primary   Relevant Orders   Lipid panel   Comprehensive metabolic panel   Uncontrolled type 2 diabetes mellitus with hyperglycemia [facility])   Reviewed notes from endocrinology visit in June with patient.  She will follow-up with [facility], Jacolyn Reedy, NP for her diabetes care. Reports blood sugars have been more controlled. She has not had a diabetic eye exam yet. No appointment.  Check fasting lipids today and refill statin.  Follow-up in 3 months or sooner if needed.  I have discontinued Sharlyne Southall's Vitamin D (Ergocalciferol) and Dexcom G7 Sensor. I am also having her maintain her ibuprofen, glucose blood, onetouch ultrasoft, Biotin, multivitamin with minerals, nystatin cream, triamcinolone cream, Insulin Pen Needle, metFORMIN, insulin degludec, Trulicity, atorvastatin, FreeStyle Libre 3 Sensor, and Trulicity.  Meds ordered this encounter  Medications   DISCONTD: Continuous Blood Gluc Sensor (DEXCOM G7 SENSOR) MISC    Sig: Apply 1 sensor every 10 days.    Dispense:  3 each    Refill:  6

## 2021-12-23 ENCOUNTER — Other Ambulatory Visit: Payer: Self-pay | Admitting: Family

## 2021-12-23 ENCOUNTER — Other Ambulatory Visit: Payer: Self-pay | Admitting: Family Medicine

## 2021-12-25 ENCOUNTER — Telehealth: Payer: Self-pay | Admitting: Family Medicine

## 2021-12-25 MED ORDER — NYSTATIN 100000 UNIT/GM EX CREA: 1.0000 | TOPICAL_CREAM | Freq: Two times a day (BID) | CUTANEOUS | 0 refills | Status: DC

## 2021-12-25 MED ORDER — TRIAMCINOLONE ACETONIDE 0.1 % EX CREA: TOPICAL_CREAM | CUTANEOUS | 1 refills | Status: DC

## 2021-12-25 NOTE — Telephone Encounter (Signed)
Pt called for new prescription due to original prescribing provider advised her to contact PCP for further refills Triamcinolone Acetonide 0.1 % APPLY TO AFFECTED AREA TWICE A DAY

## 2021-12-25 NOTE — Telephone Encounter (Signed)
Pt called to request information regarding [name] Reedy, NP with [facility] states she is unsure it Bur[facility] Gauze is a specialist she needs to see again.

## 2021-12-25 NOTE — Telephone Encounter (Signed)
Ok to refill 

## 2021-12-25 NOTE — Addendum Note (Signed)
Addended by: Rossie Muskrat on: 12/25/2021 03:41 PM   Modules accepted: Orders

## 2021-12-25 NOTE — Telephone Encounter (Signed)
Rx sent 

## 2021-12-25 NOTE — Telephone Encounter (Signed)
Called pt and clarified that endocrinologist is a specialty office. All questions were answered and pt verbalized understanding.

## 2021-12-31 ENCOUNTER — Other Ambulatory Visit: Payer: Self-pay | Admitting: Family Medicine

## 2022-01-08 ENCOUNTER — Other Ambulatory Visit: Payer: Self-pay

## 2022-01-08 ENCOUNTER — Other Ambulatory Visit: Payer: Self-pay | Admitting: Family Medicine

## 2022-01-08 DIAGNOSIS — E1169 Type 2 diabetes mellitus with other specified complication: Secondary | ICD-10-CM

## 2022-01-09 ENCOUNTER — Other Ambulatory Visit: Payer: Self-pay | Admitting: Family Medicine

## 2022-01-09 ENCOUNTER — Telehealth: Payer: Self-pay | Admitting: Family Medicine

## 2022-01-09 MED ORDER — FLUCONAZOLE 150 MG PO TABS: 150.0000 mg | ORAL_TABLET | Freq: Once | ORAL | 0 refills | Status: AC

## 2022-01-09 NOTE — Telephone Encounter (Signed)
Called pt and relayed information below, pt verbalized understanding

## 2022-01-09 NOTE — Telephone Encounter (Signed)
Pt is requesting diflucan for yeast infection

## 2022-01-09 NOTE — Telephone Encounter (Signed)
Patient would like a diflucan called in for a yeast infection.  Please send this in to [address]  Last visit:  12/07/2021

## 2022-01-30 ENCOUNTER — Other Ambulatory Visit: Payer: Self-pay | Admitting: Family Medicine

## 2022-01-30 DIAGNOSIS — Z1231 Encounter for screening mammogram for malignant neoplasm of breast: Secondary | ICD-10-CM

## 2022-03-27 ENCOUNTER — Ambulatory Visit: Payer: Commercial Managed Care - HMO

## 2022-05-14 ENCOUNTER — Ambulatory Visit
Admission: RE | Admit: 2022-05-14 | Discharge: 2022-05-14 | Disposition: A | Discharge status: Home / Self Care | Payer: BC Managed Care – PPO | Source: Ambulatory Visit | Attending: Family Medicine | Admitting: Family Medicine

## 2022-05-14 DIAGNOSIS — Z1231 Encounter for screening mammogram for malignant neoplasm of breast: Secondary | ICD-10-CM

## 2022-06-16 ENCOUNTER — Other Ambulatory Visit: Payer: Self-pay | Admitting: Family Medicine

## 2022-06-16 DIAGNOSIS — E1169 Type 2 diabetes mellitus with other specified complication: Secondary | ICD-10-CM

## 2022-06-25 ENCOUNTER — Telehealth: Payer: Self-pay | Admitting: Family Medicine

## 2022-06-25 NOTE — Telephone Encounter (Signed)
Pt called to let us know her Trulicity is too expensive and was wondering if there is an alternative for her as she is due for a refill?

## 2022-06-25 NOTE — Telephone Encounter (Signed)
Patient called and said her Trulicity is very expensive and would like to know what PCP would recommend. She is due for a refill. She would like a callback at [phone].

## 2022-06-26 NOTE — Telephone Encounter (Signed)
Called pt and relayed PCP response, pt verbalized understanding and states she will get in touch with her endocrinologist to see about Ozempic

## 2022-07-11 ENCOUNTER — Other Ambulatory Visit: Payer: Self-pay | Admitting: Family Medicine

## 2022-07-11 ENCOUNTER — Telehealth: Payer: Self-pay

## 2022-07-11 MED ORDER — FLUCONAZOLE 150 MG PO TABS: 150.0000 mg | ORAL_TABLET | Freq: Once | ORAL | 0 refills | Status: AC

## 2022-07-11 NOTE — Telephone Encounter (Signed)
Pt states she is needing Diflucan sent in and Metformin to her pharmacy as her insurance will be ending on Monday and new one will kick in, in July.  Pt states she can not afford the trulicity so she is needing the Metformin sent in. Pt states she has not been able to take her meds and is now noticing the change in her urine with the sugar being released in her urine and is now in the beginning stages of a yeast infection due to the increased sugar coming out in her urine.   **Pt asked that a nurse give her a call with an update. Opt was informed she may need to come in and pt has stated she has to be to work at [employer] today.

## 2022-07-11 NOTE — Telephone Encounter (Signed)
Called pt and relayed MD response.. pt verbalized understanding

## 2022-07-11 NOTE — Telephone Encounter (Signed)
Pt requesting diflucan sent in as she has been noticing a change in her blood sugars due to not being on trulicity and is having beginning stage of yeast infection. She states she declines appt as she has to be at work by 1 today

## 2022-07-24 ENCOUNTER — Other Ambulatory Visit: Payer: Self-pay

## 2022-07-24 MED ORDER — METFORMIN HCL 1000 MG PO TABS: 1000.0000 mg | ORAL_TABLET | Freq: Two times a day (BID) | ORAL | 3 refills | Status: DC

## 2022-07-24 MED ORDER — METFORMIN HCL ER 500 MG PO TB24
1000.0000 mg | ORAL_TABLET | Freq: Every day | ORAL | 3 refills | Status: DC
  Filled 2022-07-24: qty 60, 30d supply, fill #0
  Filled 2022-09-04: qty 60, 30d supply, fill #1

## 2022-07-24 MED ORDER — TRULICITY 3 MG/0.5ML ~~LOC~~ SOAJ
3.0000 mg | SUBCUTANEOUS | 3 refills | Status: DC
  Filled 2022-07-24: qty 2, 28d supply, fill #0
  Filled 2022-08-16 – 2022-09-12 (×3): qty 2, 28d supply, fill #1

## 2022-07-24 MED ORDER — FREESTYLE LIBRE 3 SENSOR MISC
11 refills | Status: DC
  Filled 2022-08-07: qty 2, 28d supply, fill #0

## 2022-07-24 NOTE — Progress Notes (Signed)
   [name] [name] 1960/04/04 [id number]  Patient seen by Buddy Duty , PharmD Candidate on 07/24/22.  Pt said she would come back for a recheck and to discuss medication but did not return.   Blood Pressure Readings: Systolic BP today: 146 Diastolic BP today: 82 They report home readings ***  Medication review was performed.   Differences from their prescribed list include: ***  The following barriers to adherence were noted:     Interventions:    The patient has follow up scheduled:  PCP: Avanell Shackleton, NP-C   [facility], Student-PharmD

## 2022-08-07 ENCOUNTER — Other Ambulatory Visit: Payer: Self-pay

## 2022-08-16 ENCOUNTER — Other Ambulatory Visit: Payer: Self-pay

## 2022-09-04 ENCOUNTER — Other Ambulatory Visit (HOSPITAL_COMMUNITY): Payer: Self-pay

## 2022-09-12 ENCOUNTER — Other Ambulatory Visit (HOSPITAL_COMMUNITY): Payer: Self-pay

## 2022-09-19 ENCOUNTER — Other Ambulatory Visit: Payer: Self-pay

## 2022-09-19 ENCOUNTER — Other Ambulatory Visit (HOSPITAL_COMMUNITY): Payer: Self-pay

## 2022-09-24 ENCOUNTER — Telehealth: Payer: Self-pay | Admitting: Family Medicine

## 2022-09-24 DIAGNOSIS — E1169 Type 2 diabetes mellitus with other specified complication: Secondary | ICD-10-CM

## 2022-09-24 DIAGNOSIS — E1165 Type 2 diabetes mellitus with hyperglycemia: Secondary | ICD-10-CM

## 2022-09-24 NOTE — Telephone Encounter (Signed)
Pt need a referral resent to Endocrinology. Pt stated she is in a lot of pain and wondering what she can get in the mean time. Please advise.

## 2022-09-25 ENCOUNTER — Telehealth: Payer: Self-pay | Admitting: Family Medicine

## 2022-09-25 NOTE — Telephone Encounter (Signed)
Endo referral placed. Pt will need appointment for any medication

## 2022-09-25 NOTE — Telephone Encounter (Signed)
Called pt and advised she will need an appt

## 2022-09-25 NOTE — Telephone Encounter (Signed)
Patient would like a diflucan called in for a yeast infection. Prescription Request  09/25/2022  LOV: 12/07/2021  What is the name of the medication or equipment? diflucan  Have you contacted your pharmacy to request a refill? No   Which pharmacy would you like this sent to?  [id number] [place], [place] - [address] [place] [address] Phone: [phone] Fax: [phone]     Patient notified that their request is being sent to the clinical staff for review and that they should receive a response within 2 business days.   Please advise at Mobile [phone] (mobile)

## 2022-09-25 NOTE — Telephone Encounter (Signed)
Called pt and advised she will need an appt for medication, pt was upset that she needed to make an appt just for a pill to be sent in. I advised this is our office policy and since [name] [name] is out of office, provider who does not know her will not send in a pill w/o being seen. Pt scheduled mychart visit for tomorrow.

## 2022-09-26 ENCOUNTER — Telehealth: Payer: BC Managed Care – PPO | Admitting: Internal Medicine

## 2023-01-10 ENCOUNTER — Other Ambulatory Visit: Payer: Self-pay

## 2023-02-03 ENCOUNTER — Other Ambulatory Visit: Payer: Self-pay | Admitting: Family Medicine

## 2023-02-04 ENCOUNTER — Other Ambulatory Visit (HOSPITAL_COMMUNITY): Payer: Self-pay

## 2023-02-06 ENCOUNTER — Other Ambulatory Visit (HOSPITAL_COMMUNITY): Payer: Self-pay

## 2023-02-06 MED ORDER — DULAGLUTIDE 3 MG/0.5ML ~~LOC~~ SOAJ
3.0000 mg | SUBCUTANEOUS | 3 refills | Status: DC
  Filled 2023-02-06: qty 6, 84d supply, fill #0

## 2023-02-06 MED ORDER — FREESTYLE LIBRE 3 SENSOR MISC
11 refills | Status: DC
Start: 2023-02-06 — End: 2023-09-03
  Filled 2023-02-06 – 2023-05-06 (×2): qty 2, 28d supply, fill #0

## 2023-02-13 ENCOUNTER — Other Ambulatory Visit (HOSPITAL_COMMUNITY): Payer: Self-pay

## 2023-02-13 MED ORDER — TRULICITY 3 MG/0.5ML ~~LOC~~ SOAJ
3.0000 mg | SUBCUTANEOUS | 3 refills | Status: DC
Start: 2023-02-12 — End: 2023-06-03
  Filled 2023-02-13: qty 6, 84d supply, fill #0
  Filled 2023-05-03: qty 2, 28d supply, fill #1
  Filled 2023-05-30: qty 2, 28d supply, fill #2

## 2023-02-14 ENCOUNTER — Other Ambulatory Visit (HOSPITAL_COMMUNITY): Payer: Self-pay

## 2023-02-14 ENCOUNTER — Encounter (HOSPITAL_COMMUNITY): Payer: Self-pay

## 2023-04-09 ENCOUNTER — Other Ambulatory Visit: Payer: Self-pay

## 2023-04-09 ENCOUNTER — Other Ambulatory Visit: Payer: Self-pay | Admitting: Family Medicine

## 2023-04-09 DIAGNOSIS — Z1231 Encounter for screening mammogram for malignant neoplasm of breast: Secondary | ICD-10-CM

## 2023-04-11 ENCOUNTER — Encounter: Payer: Self-pay | Admitting: Family Medicine

## 2023-04-11 ENCOUNTER — Ambulatory Visit: Payer: Medicaid Other | Admitting: Family Medicine

## 2023-04-11 VITALS — BP 132/86 | HR 87 | Temp 97.6°F | Ht 60.0 in | Wt 182.0 lb

## 2023-04-11 DIAGNOSIS — E1169 Type 2 diabetes mellitus with other specified complication: Secondary | ICD-10-CM

## 2023-04-11 DIAGNOSIS — E559 Vitamin D deficiency, unspecified: Secondary | ICD-10-CM

## 2023-04-11 DIAGNOSIS — Z23 Encounter for immunization: Secondary | ICD-10-CM

## 2023-04-11 DIAGNOSIS — E785 Hyperlipidemia, unspecified: Secondary | ICD-10-CM | POA: Diagnosis not present

## 2023-04-11 DIAGNOSIS — E118 Type 2 diabetes mellitus with unspecified complications: Secondary | ICD-10-CM

## 2023-04-11 DIAGNOSIS — E669 Obesity, unspecified: Secondary | ICD-10-CM | POA: Diagnosis not present

## 2023-04-11 DIAGNOSIS — Z0001 Encounter for general adult medical examination with abnormal findings: Secondary | ICD-10-CM

## 2023-04-11 LAB — CBC WITH DIFFERENTIAL/PLATELET
Basophils Absolute: 0 10*3/uL (ref 0.0–0.1)
Basophils Relative: 0.6 % (ref 0.0–3.0)
Eosinophils Absolute: 0.1 10*3/uL (ref 0.0–0.7)
Eosinophils Relative: 1.8 % (ref 0.0–5.0)
HCT: 39 % (ref 36.0–46.0)
Hemoglobin: 12.1 g/dL (ref 12.0–15.0)
Lymphocytes Relative: 47.2 % — ABNORMAL HIGH (ref 12.0–46.0)
Lymphs Abs: 2 10*3/uL (ref 0.7–4.0)
MCHC: 31.2 g/dL (ref 30.0–36.0)
MCV: 71.4 fL — ABNORMAL LOW (ref 78.0–100.0)
Monocytes Absolute: 0.3 10*3/uL (ref 0.1–1.0)
Monocytes Relative: 8.1 % (ref 3.0–12.0)
Neutro Abs: 1.8 10*3/uL (ref 1.4–7.7)
Neutrophils Relative %: 42.3 % — ABNORMAL LOW (ref 43.0–77.0)
Platelets: 248 10*3/uL (ref 150.0–400.0)
RBC: 5.46 Mil/uL — ABNORMAL HIGH (ref 3.87–5.11)
RDW: 15.3 % (ref 11.5–15.5)
WBC: 4.2 10*3/uL (ref 4.0–10.5)

## 2023-04-11 LAB — LIPID PANEL
Cholesterol: 164 mg/dL (ref 0–200)
HDL: 42 mg/dL (ref >39.00)
LDL Cholesterol: 100 mg/dL — ABNORMAL HIGH (ref 0–99)
NonHDL: 122.47
Total CHOL/HDL Ratio: 4
Triglycerides: 114 mg/dL (ref 0.0–149.0)
VLDL: 22.8 mg/dL (ref 0.0–40.0)

## 2023-04-11 LAB — COMPREHENSIVE METABOLIC PANEL
ALT: 14 U/L (ref 0–35)
AST: 14 U/L (ref 0–37)
Albumin: 4.1 g/dL (ref 3.5–5.2)
Alkaline Phosphatase: 73 U/L (ref 39–117)
BUN: 10 mg/dL (ref 6–23)
CO2: 26 meq/L (ref 19–32)
Calcium: 8.9 mg/dL (ref 8.4–10.5)
Chloride: 104 meq/L (ref 96–112)
Creatinine, Ser: 0.79 mg/dL (ref 0.40–1.20)
GFR: 79.23 mL/min (ref >60.00)
Glucose, Bld: 170 mg/dL — ABNORMAL HIGH (ref 70–99)
Potassium: 4.1 meq/L (ref 3.5–5.1)
Sodium: 140 meq/L (ref 135–145)
Total Bilirubin: 0.4 mg/dL (ref 0.2–1.2)
Total Protein: 6.8 g/dL (ref 6.0–8.3)

## 2023-04-11 LAB — MICROALBUMIN / CREATININE URINE RATIO
Creatinine,U: 151.2 mg/dL
Microalb Creat Ratio: 0.6 mg/g (ref 0.0–30.0)
Microalb, Ur: 0.9 mg/dL (ref 0.0–1.9)

## 2023-04-11 LAB — TSH: TSH: 1.67 u[IU]/mL (ref 0.35–5.50)

## 2023-04-11 LAB — VITAMIN D 25 HYDROXY (VIT D DEFICIENCY, FRACTURES): VITD: 11.26 ng/mL — ABNORMAL LOW (ref 30.00–100.00)

## 2023-04-11 LAB — VITAMIN B12: Vitamin B-12: 177 pg/mL — ABNORMAL LOW (ref 211–911)

## 2023-04-11 MED ORDER — VITAMIN D (ERGOCALCIFEROL) 1.25 MG (50000 UNIT) PO CAPS: 50000.0000 [IU] | ORAL_CAPSULE | ORAL | 2 refills | Status: DC

## 2023-04-11 NOTE — Patient Instructions (Signed)
 Please go downstairs for labs and a urine test before you leave today.  Call and schedule with your endocrinologist for your diabetes.  I will refill your cholesterol medication once I have your cholesterol results.  Call to make sure you are up-to-date with your pelvic exam with your OB/GYN.  I referred you to an ophthalmologist and they will call you to schedule a diabetic eye exam.

## 2023-04-11 NOTE — Progress Notes (Signed)
 Her vitamin B12 level is quite low.  I recommend once weekly B12 injections in our office x 4 weeks and then monthly injections after that.  Also, her vitamin D  is severely low.  I will send in a once weekly high-dose vitamin D  prescription for her to take x 3 months.  I also recommend that she start taking a multivitamin.  Her LDL (the bad cholesterol) is marginally elevated so cut back on saturated fats, fried foods and high processed foods.  Please refill her cholesterol medication.  I would like to see her back for a fasting follow-up in 6 months

## 2023-04-11 NOTE — Progress Notes (Signed)
 Complete physical exam  Patient: [name] [name]   DOB: 14-May-1960   63 y.o. Female  MRN: [id number]  Subjective:    Chief Complaint  Patient presents with   Annual Exam   She is here for a complete physical exam. : She sees endocrinology for DM   Married  3 kids and 3 grandchildren   Fairly healthy diet  Does not eat out often      Health Maintenance  Topic Date Due   Zoster (Shingles) Vaccine (1 of 2) Never done   Eye exam for diabetics  01/17/2021   Yearly kidney health urinalysis for diabetes  04/19/2021   Hemoglobin A1C  11/25/2021   Yearly kidney function blood test for diabetes  12/08/2022   COVID-19 Vaccine (3 - Pfizer risk series) 04/27/2023*   Pap with HPV screening  10/06/2023   Complete foot exam   04/10/2024   Mammogram  05/13/2024   Colon Cancer Screening  09/13/2025   DTaP/Tdap/Td vaccine (2 - Td or Tdap) 10/11/2027   Pneumococcal Vaccination (3 of 3 - PPSV23 or PCV20) 04/10/2028   Hepatitis C Screening  Completed   HIV Screening  Completed   HPV Vaccine  Aged Out   Flu Shot  Discontinued  *Topic was postponed. The date shown is not the original due date.    Wears seatbelt always, smoke detectors in home and functioning, does not text while driving, feels safe in home environment.  Depression screening:    12/07/2021    9:14 AM 05/25/2021    4:11 PM 07/29/2020    4:34 PM  Depression screen PHQ 2/9  Decreased Interest 1 3 3   Down, Depressed, Hopeless 3 3 3   PHQ - 2 Score 4 6 6   Altered sleeping 0  3  Tired, decreased energy 1  3  Change in appetite 0  3  Feeling bad or failure about yourself  3  2  Trouble concentrating 0  3  Moving slowly or fidgety/restless 0  3  Suicidal thoughts 0  2  PHQ-9 Score 8  25  Difficult doing work/chores Somewhat difficult  Extremely dIfficult   Anxiety Screening:     No data to display          Vision:Not within last year  and Dental: No current dental problems and Receives regular dental  care  Patient Active Problem List   Diagnosis Date Noted   Hypoglycemia 09/14/2021   Vaginal yeast infection 08/17/2021   Poor appetite 06/21/2021   Dry mouth 06/21/2021   Uncontrolled type 2 diabetes mellitus with hyperglycemia (HCC) 05/25/2021   Rib pain on right side 10/24/2020   Vitamin D  deficiency 10/24/2020   Low back pain 09/15/2020   Left shoulder pain 07/10/2018   Abdominal pain 07/03/2018   Diarrhea 07/03/2018   Nausea with vomiting 07/03/2018   Urinary frequency 06/15/2016   Type 2 diabetes with complication (HCC) 12/30/2012   Hyperlipidemia associated with type 2 diabetes mellitus (HCC) 12/30/2012   Obesity (BMI 30-39.9) 12/23/2012   Colon cancer (HCC) 10/31/2011   Past Medical History:  Diagnosis Date   Abdominal hernia    4 hernias per pt   Abdominal pain    mid abd pain radiating to right side   Anemia    Cancer (HCC)    rectal ca   Change in bowel movement    Colon cancer (HCC)    Complication of anesthesia    cardiac arrest during nasal surgery   Diabetes mellitus without complication (HCC)  Headache    History of rectal bleeding    Past Surgical History:  Procedure Laterality Date   CESAREAN SECTION     colon cancer     COLON SURGERY     COLONOSCOPY     LAPAROSCOPIC LYSIS OF ADHESIONS  04/02/2014   Procedure: LAPAROSCOPIC LYSIS OF ADHESIONS;  Surgeon: Krystal Russell, MD;  Location: [facility];  Service: General;;   NOSE SURGERY     POLYPECTOMY     VENTRAL HERNIA REPAIR N/A 04/02/2014   Procedure: LAPAROSCOPIC  LYSIS OF ADHESIONS AND REPAIR OF VENTRAL INCISIONAL HERNIAS WITH MESH;  Surgeon: Krystal Russell, MD;  Location: [facility];  Service: General;  Laterality: N/A;   Social History   Tobacco Use   Smoking status: Former    Current packs/day: 0.00    Average packs/day: 0.3 packs/day for 13.0 years (3.3 ttl pk-yrs)    Types: Cigarettes    Start date: 03/13/1990    Quit date: 03/14/2003    Years since quitting: 20.0   Smokeless tobacco: Never   Vaping Use   Vaping status: Some Days   Substances: Flavoring   Devices: flavored , non-nicotine  Substance Use Topics   Alcohol use: Yes    Alcohol/week: 0.0 standard drinks of alcohol    Comment: occasional    Drug use: No      Patient Care Team: Lendia Boby CROME, NP-C as PCP - General (Family Medicine)   Outpatient Medications Prior to Visit  Medication Sig   atorvastatin  (LIPITOR) 20 MG tablet Take 1 tablet (20 mg total) by mouth daily. Must see provider for further refills.   Biotin 1 MG CAPS Take by mouth.   Continuous Blood Gluc Sensor (FREESTYLE LIBRE 3 SENSOR) MISC SMARTSIG:1 Topical Every 2 Weeks   Continuous Glucose Sensor (FREESTYLE LIBRE 3 SENSOR) MISC Apply 1 sensor to the skin every 14 days for continuous glucose monitoring.   Dulaglutide  (TRULICITY ) 3 MG/0.5ML SOAJ Inject 3 mg into the skin once a week.   glucose blood test strip Use as instructed   ibuprofen (ADVIL,MOTRIN) 200 MG tablet Take 400 mg by mouth every 6 (six) hours as needed for mild pain or moderate pain. Reported on 07/12/2015   insulin  degludec (TRESIBA) 200 UNIT/ML FlexTouch Pen Inject 20 Units into the skin daily.   Insulin  Pen Needle 32G X 4 MM MISC Use as directed   Lancets (ONETOUCH ULTRASOFT) lancets Use as instructed   metFORMIN  (GLUCOPHAGE ) 1000 MG tablet Take 1 tablet (1,000 mg total) by mouth 2 (two) times daily with a meal.   metFORMIN  (GLUCOPHAGE -XR) 500 MG 24 hr tablet Take 2 tablets (1,000 mg total) by mouth daily with breakfast.   Multiple Vitamin (MULTIVITAMIN WITH MINERALS) TABS tablet Take 1 tablet by mouth daily.   nystatin  cream (MYCOSTATIN ) Apply 1 Application topically 2 (two) times daily.   triamcinolone  cream (KENALOG ) 0.1 % APPLY TO AFFECTED AREA TWICE A DAY   [DISCONTINUED] TRULICITY  3 MG/0.5ML SOPN    [DISCONTINUED] TRULICITY  1.5 MG/0.5ML SOPN INJECT 1.5MG  INTO THE SKIN ONCE WEEKLY (Patient not taking: Reported on 04/11/2023)   No facility-administered medications prior to  visit.    Review of Systems  Constitutional:  Positive for weight loss. Negative for chills, fever and malaise/fatigue.       Intentional   HENT:  Negative for congestion, ear pain, sinus pain and sore throat.   Eyes:  Negative for blurred vision, double vision and pain.  Respiratory:  Negative for cough, shortness of breath and wheezing.  Cardiovascular:  Negative for chest pain, palpitations and leg swelling.  Gastrointestinal:  Negative for abdominal pain, constipation, diarrhea, nausea and vomiting.  Genitourinary:  Negative for dysuria, frequency and urgency.  Musculoskeletal:  Negative for back pain, joint pain and myalgias.  Skin:  Negative for rash.  Neurological:  Negative for dizziness, tingling, focal weakness and headaches.  Psychiatric/Behavioral:  Negative for depression and memory loss. The patient is not nervous/anxious.        Objective:    BP 132/86 (BP Location: Left Arm, Patient Position: Sitting, Cuff Size: Large)   Pulse 87   Temp 97.6 F (36.4 C) (Temporal)   Ht 5' (1.524 m)   Wt 182 lb (82.6 kg)   SpO2 97%   BMI 35.54 kg/m  BP Readings from Last 3 Encounters:  04/11/23 132/86  12/07/21 (!) 148/84  09/14/21 (!) 146/86   Wt Readings from Last 3 Encounters:  04/11/23 182 lb (82.6 kg)  12/07/21 189 lb (85.7 kg)  09/14/21 183 lb (83 kg)    Physical Exam Constitutional:      General: She is not in acute distress.    Appearance: She is not ill-appearing.  HENT:     Right Ear: Tympanic membrane, ear canal and external ear normal.     Left Ear: Tympanic membrane, ear canal and external ear normal.     Nose: Nose normal.     Mouth/Throat:     Mouth: Mucous membranes are moist.     Pharynx: Oropharynx is clear.  Eyes:     Extraocular Movements: Extraocular movements intact.     Conjunctiva/sclera: Conjunctivae normal.     Pupils: Pupils are equal, round, and reactive to light.  Neck:     Thyroid : No thyroid  mass, thyromegaly or thyroid   tenderness.  Cardiovascular:     Rate and Rhythm: Normal rate and regular rhythm.     Pulses: Normal pulses.     Heart sounds: Normal heart sounds.  Pulmonary:     Effort: Pulmonary effort is normal.     Breath sounds: Normal breath sounds.  Abdominal:     General: Bowel sounds are normal.     Palpations: Abdomen is soft.     Tenderness: There is no abdominal tenderness. There is no right CVA tenderness, left CVA tenderness, guarding or rebound.  Musculoskeletal:        General: Normal range of motion.     Cervical back: Normal range of motion and neck supple. No tenderness.     Right lower leg: No edema.     Left lower leg: No edema.  Lymphadenopathy:     Cervical: No cervical adenopathy.  Skin:    General: Skin is warm and dry.     Findings: No lesion or rash.  Neurological:     General: No focal deficit present.     Mental Status: She is alert and oriented to person, place, and time.     Cranial Nerves: No cranial nerve deficit.     Sensory: No sensory deficit.     Motor: No weakness.     Gait: Gait normal.  Psychiatric:        Mood and Affect: Mood normal.        Behavior: Behavior normal.        Thought Content: Thought content normal.      No results found for any visits on 04/11/23.    Assessment & Plan:    Routine Health Maintenance and Physical Exam  Problem List Items  Addressed This Visit     Hyperlipidemia associated with type 2 diabetes mellitus (HCC)   Relevant Orders   Lipid panel   Obesity (BMI 30-39.9)   Relevant Orders   CBC with Differential/Platelet   Comprehensive metabolic panel   Lipid panel   TSH   Type 2 diabetes with complication (HCC)   Relevant Orders   Ambulatory referral to Ophthalmology   CBC with Differential/Platelet   Comprehensive metabolic panel   TSH   Vitamin B12   Microalbumin / creatinine urine ratio   Vitamin D  deficiency   Relevant Orders   VITAMIN D  25 Hydroxy (Vit-D Deficiency, Fractures)   Other Visit  Diagnoses       Encounter for general adult medical examination with abnormal findings    -  Primary   Relevant Orders   Pneumococcal polysaccharide vaccine 23-valent greater than or equal to 2yo subcutaneous/IM (Completed)     Need for pneumococcal vaccination       Relevant Orders   Pneumococcal polysaccharide vaccine 23-valent greater than or equal to 2yo subcutaneous/IM (Completed)      Preventive health care reviewed.  Counseling on healthy lifestyle including diet and exercise.  Recommend regular dental and eye exams.  Immunizations reviewed.  Discussed safety. She will call to schedule with her OB/GYN and endocrinologist. Referral to ophthalmology for diabetic eye exam which she is overdue for. Check urine microalbumin. Normal foot exam done today Pneumonia vaccine updated today.  Declines flu vaccine. Check vitamin D  level and supplement as appropriate Congratulated her on weight loss and encouraged her to keep up healthy diet and exercise. Check labs and refill cholesterol medication. Follow-up in 6 months or sooner if needed.  Return in about 6 months (around 10/09/2023) for chronic health conditions.     Boby Mackintosh, NP-C

## 2023-04-11 NOTE — Addendum Note (Signed)
 Addended by: Purl Claytor L on: 04/11/2023 02:52 PM   Modules accepted: Orders

## 2023-04-17 ENCOUNTER — Ambulatory Visit (INDEPENDENT_AMBULATORY_CARE_PROVIDER_SITE_OTHER): Payer: Medicaid Other

## 2023-04-17 DIAGNOSIS — E538 Deficiency of other specified B group vitamins: Secondary | ICD-10-CM

## 2023-04-17 MED ORDER — CYANOCOBALAMIN 1000 MCG/ML IJ SOLN
1000.0000 ug | Freq: Once | INTRAMUSCULAR | Status: AC
  Administered 2023-04-17: 1000 ug via INTRAMUSCULAR

## 2023-04-17 NOTE — Progress Notes (Signed)
Pt was given 1st of 4 weekly B12 injections w/o any complications at this time.

## 2023-04-23 ENCOUNTER — Ambulatory Visit (INDEPENDENT_AMBULATORY_CARE_PROVIDER_SITE_OTHER): Payer: Medicaid Other

## 2023-04-23 ENCOUNTER — Ambulatory Visit: Payer: Medicaid Other

## 2023-04-23 DIAGNOSIS — E538 Deficiency of other specified B group vitamins: Secondary | ICD-10-CM

## 2023-04-23 MED ORDER — CYANOCOBALAMIN 1000 MCG/ML IJ SOLN
1000.0000 ug | Freq: Once | INTRAMUSCULAR | Status: AC
  Administered 2023-04-23: 1000 ug via INTRAMUSCULAR

## 2023-04-23 NOTE — Progress Notes (Signed)
Patient visits today for their b-12 injection. Patient informed of what they had received and tolreated the injection well. Patient notified to reach out to office if needed.

## 2023-04-24 ENCOUNTER — Ambulatory Visit: Payer: Medicaid Other

## 2023-05-01 ENCOUNTER — Ambulatory Visit: Payer: Medicaid Other

## 2023-05-01 ENCOUNTER — Ambulatory Visit (INDEPENDENT_AMBULATORY_CARE_PROVIDER_SITE_OTHER): Payer: Medicaid Other

## 2023-05-01 DIAGNOSIS — E538 Deficiency of other specified B group vitamins: Secondary | ICD-10-CM | POA: Diagnosis not present

## 2023-05-01 MED ORDER — CYANOCOBALAMIN 1000 MCG/ML IJ SOLN
1000.0000 ug | Freq: Once | INTRAMUSCULAR | Status: AC
  Administered 2023-05-01: 1000 ug via INTRAMUSCULAR

## 2023-05-01 NOTE — Progress Notes (Signed)
 Patient visits today to receive her B12 injection/vaccine. Patient was informed and tolerated well. Patient was notified to reach out to [place] if needed.

## 2023-05-03 ENCOUNTER — Other Ambulatory Visit (HOSPITAL_COMMUNITY): Payer: Self-pay

## 2023-05-06 ENCOUNTER — Other Ambulatory Visit (HOSPITAL_COMMUNITY): Payer: Self-pay

## 2023-05-08 ENCOUNTER — Ambulatory Visit: Payer: Medicaid Other

## 2023-05-08 ENCOUNTER — Ambulatory Visit (INDEPENDENT_AMBULATORY_CARE_PROVIDER_SITE_OTHER): Payer: Medicaid Other

## 2023-05-08 DIAGNOSIS — E538 Deficiency of other specified B group vitamins: Secondary | ICD-10-CM

## 2023-05-08 MED ORDER — CYANOCOBALAMIN 1000 MCG/ML IJ SOLN
1000.0000 ug | Freq: Once | INTRAMUSCULAR | Status: AC
Start: 2023-05-08 — End: 2023-05-08
  Administered 2023-05-08: 1000 ug via INTRAMUSCULAR

## 2023-05-08 NOTE — Progress Notes (Signed)
 Patient visits today for their b-12 injection. Patient informed of what they had received and tolerated the injection well. Patient notified to reach out to office if needed.

## 2023-05-12 ENCOUNTER — Other Ambulatory Visit: Payer: Self-pay

## 2023-05-12 ENCOUNTER — Encounter (HOSPITAL_COMMUNITY): Payer: Self-pay

## 2023-05-12 ENCOUNTER — Emergency Department (HOSPITAL_COMMUNITY)

## 2023-05-12 ENCOUNTER — Inpatient Hospital Stay (HOSPITAL_COMMUNITY)
Admission: EM | Admit: 2023-05-12 | Discharge: 2023-05-14 | DRG: 379 | Disposition: A | Discharge status: Home / Self Care | Attending: Student | Admitting: Student

## 2023-05-12 DIAGNOSIS — Z888 Allergy status to other drugs, medicaments and biological substances status: Secondary | ICD-10-CM

## 2023-05-12 DIAGNOSIS — Z7985 Long-term (current) use of injectable non-insulin antidiabetic drugs: Secondary | ICD-10-CM

## 2023-05-12 DIAGNOSIS — Z800 Family history of malignant neoplasm of digestive organs: Secondary | ICD-10-CM | POA: Diagnosis not present

## 2023-05-12 DIAGNOSIS — Z7984 Long term (current) use of oral hypoglycemic drugs: Secondary | ICD-10-CM

## 2023-05-12 DIAGNOSIS — E118 Type 2 diabetes mellitus with unspecified complications: Secondary | ICD-10-CM

## 2023-05-12 DIAGNOSIS — Z794 Long term (current) use of insulin: Secondary | ICD-10-CM

## 2023-05-12 DIAGNOSIS — F17290 Nicotine dependence, other tobacco product, uncomplicated: Secondary | ICD-10-CM | POA: Diagnosis present

## 2023-05-12 DIAGNOSIS — E785 Hyperlipidemia, unspecified: Secondary | ICD-10-CM | POA: Diagnosis present

## 2023-05-12 DIAGNOSIS — Z6835 Body mass index (BMI) 35.0-35.9, adult: Secondary | ICD-10-CM

## 2023-05-12 DIAGNOSIS — Z8674 Personal history of sudden cardiac arrest: Secondary | ICD-10-CM

## 2023-05-12 DIAGNOSIS — R519 Headache, unspecified: Secondary | ICD-10-CM | POA: Diagnosis present

## 2023-05-12 DIAGNOSIS — Z79899 Other long term (current) drug therapy: Secondary | ICD-10-CM

## 2023-05-12 DIAGNOSIS — Z923 Personal history of irradiation: Secondary | ICD-10-CM | POA: Diagnosis not present

## 2023-05-12 DIAGNOSIS — Z884 Allergy status to anesthetic agent status: Secondary | ICD-10-CM

## 2023-05-12 DIAGNOSIS — Z85048 Personal history of other malignant neoplasm of rectum, rectosigmoid junction, and anus: Secondary | ICD-10-CM

## 2023-05-12 DIAGNOSIS — K529 Noninfective gastroenteritis and colitis, unspecified: Secondary | ICD-10-CM | POA: Diagnosis present

## 2023-05-12 DIAGNOSIS — K5900 Constipation, unspecified: Secondary | ICD-10-CM | POA: Diagnosis present

## 2023-05-12 DIAGNOSIS — Z8719 Personal history of other diseases of the digestive system: Secondary | ICD-10-CM | POA: Diagnosis not present

## 2023-05-12 DIAGNOSIS — R112 Nausea with vomiting, unspecified: Secondary | ICD-10-CM | POA: Diagnosis not present

## 2023-05-12 DIAGNOSIS — E1165 Type 2 diabetes mellitus with hyperglycemia: Secondary | ICD-10-CM | POA: Diagnosis present

## 2023-05-12 DIAGNOSIS — K625 Hemorrhage of anus and rectum: Secondary | ICD-10-CM | POA: Diagnosis present

## 2023-05-12 DIAGNOSIS — D649 Anemia, unspecified: Secondary | ICD-10-CM | POA: Diagnosis not present

## 2023-05-12 DIAGNOSIS — K922 Gastrointestinal hemorrhage, unspecified: Secondary | ICD-10-CM | POA: Diagnosis not present

## 2023-05-12 DIAGNOSIS — R61 Generalized hyperhidrosis: Secondary | ICD-10-CM | POA: Diagnosis not present

## 2023-05-12 DIAGNOSIS — Z9221 Personal history of antineoplastic chemotherapy: Secondary | ICD-10-CM | POA: Diagnosis not present

## 2023-05-12 DIAGNOSIS — R1084 Generalized abdominal pain: Secondary | ICD-10-CM

## 2023-05-12 DIAGNOSIS — R109 Unspecified abdominal pain: Secondary | ICD-10-CM | POA: Diagnosis not present

## 2023-05-12 DIAGNOSIS — E66812 Obesity, class 2: Secondary | ICD-10-CM | POA: Diagnosis present

## 2023-05-12 DIAGNOSIS — R933 Abnormal findings on diagnostic imaging of other parts of digestive tract: Secondary | ICD-10-CM | POA: Diagnosis not present

## 2023-05-12 LAB — CBC
HCT: 42.6 % (ref 36.0–46.0)
Hemoglobin: 12.9 g/dL (ref 12.0–15.0)
MCH: 22 pg — ABNORMAL LOW (ref 26.0–34.0)
MCHC: 30.3 g/dL (ref 30.0–36.0)
MCV: 72.7 fL — ABNORMAL LOW (ref 80.0–100.0)
Platelets: 252 10*3/uL (ref 150–400)
RBC: 5.86 MIL/uL — ABNORMAL HIGH (ref 3.87–5.11)
RDW: 14.5 % (ref 11.5–15.5)
WBC: 9.6 10*3/uL (ref 4.0–10.5)
nRBC: 0 % (ref 0.0–0.2)

## 2023-05-12 LAB — COMPREHENSIVE METABOLIC PANEL
ALT: 23 U/L (ref 0–44)
AST: 28 U/L (ref 15–41)
Albumin: 3.7 g/dL (ref 3.5–5.0)
Alkaline Phosphatase: 67 U/L (ref 38–126)
Anion gap: 11 (ref 5–15)
BUN: 7 mg/dL — ABNORMAL LOW (ref 8–23)
CO2: 24 mmol/L (ref 22–32)
Calcium: 9.2 mg/dL (ref 8.9–10.3)
Chloride: 104 mmol/L (ref 98–111)
Creatinine, Ser: 0.87 mg/dL (ref 0.44–1.00)
GFR, Estimated: >60 mL/min (ref >60)
Glucose, Bld: 206 mg/dL — ABNORMAL HIGH (ref 70–99)
Potassium: 4.1 mmol/L (ref 3.5–5.1)
Sodium: 139 mmol/L (ref 135–145)
Total Bilirubin: 0.6 mg/dL (ref 0.0–1.2)
Total Protein: 6.8 g/dL (ref 6.5–8.1)

## 2023-05-12 LAB — HEMOGLOBIN AND HEMATOCRIT, BLOOD
HCT: 42.7 % (ref 36.0–46.0)
Hemoglobin: 13 g/dL (ref 12.0–15.0)

## 2023-05-12 LAB — TYPE AND SCREEN
ABO/RH(D): B POS
Antibody Screen: NEGATIVE

## 2023-05-12 LAB — HIV ANTIBODY (ROUTINE TESTING W REFLEX): HIV Screen 4th Generation wRfx: NONREACTIVE

## 2023-05-12 LAB — CBG MONITORING, ED: Glucose-Capillary: 135 mg/dL — ABNORMAL HIGH (ref 70–99)

## 2023-05-12 LAB — HEMOGLOBIN A1C
Hgb A1c MFr Bld: 8.5 % — ABNORMAL HIGH (ref 4.8–5.6)
Mean Plasma Glucose: 197.25 mg/dL

## 2023-05-12 LAB — POC OCCULT BLOOD, ED: Fecal Occult Bld: POSITIVE — AB

## 2023-05-12 LAB — C-REACTIVE PROTEIN: CRP: 3 mg/dL — ABNORMAL HIGH (ref <1.0)

## 2023-05-12 LAB — LACTIC ACID, PLASMA: Lactic Acid, Venous: 2.1 mmol/L (ref 0.5–1.9)

## 2023-05-12 MED ORDER — SODIUM CHLORIDE 0.9 % IV SOLN: 2.0000 g | INTRAVENOUS | Status: DC

## 2023-05-12 MED ORDER — ONDANSETRON HCL 4 MG PO TABS: 4.0000 mg | ORAL_TABLET | Freq: Four times a day (QID) | ORAL | Status: DC | PRN

## 2023-05-12 MED ORDER — INSULIN ASPART 100 UNIT/ML IJ SOLN
0.0000 [IU] | INTRAMUSCULAR | Status: DC
  Administered 2023-05-13: 2 [IU] via SUBCUTANEOUS

## 2023-05-12 MED ORDER — IOHEXOL 350 MG/ML SOLN
100.0000 mL | Freq: Once | INTRAVENOUS | Status: AC | PRN
  Administered 2023-05-12: 100 mL via INTRAVENOUS

## 2023-05-12 MED ORDER — ACETAMINOPHEN 325 MG PO TABS: 650.0000 mg | ORAL_TABLET | Freq: Four times a day (QID) | ORAL | Status: DC | PRN

## 2023-05-12 MED ORDER — METRONIDAZOLE 500 MG/100ML IV SOLN
500.0000 mg | Freq: Two times a day (BID) | INTRAVENOUS | Status: DC
  Administered 2023-05-12 – 2023-05-14 (×4): 500 mg via INTRAVENOUS
  Filled 2023-05-12 (×4): qty 100

## 2023-05-12 MED ORDER — SODIUM CHLORIDE 0.9 % IV SOLN: INTRAVENOUS | Status: AC

## 2023-05-12 MED ORDER — ACETAMINOPHEN 650 MG RE SUPP: 650.0000 mg | Freq: Four times a day (QID) | RECTAL | Status: DC | PRN

## 2023-05-12 MED ORDER — SODIUM CHLORIDE 0.9 % IV SOLN
2.0000 g | INTRAVENOUS | Status: DC
  Administered 2023-05-12 – 2023-05-13 (×2): 2 g via INTRAVENOUS
  Filled 2023-05-12 (×2): qty 20

## 2023-05-12 MED ORDER — ALBUTEROL SULFATE (2.5 MG/3ML) 0.083% IN NEBU: 2.5000 mg | INHALATION_SOLUTION | RESPIRATORY_TRACT | Status: DC | PRN

## 2023-05-12 MED ORDER — ONDANSETRON HCL 4 MG/2ML IJ SOLN: 4.0000 mg | Freq: Four times a day (QID) | INTRAMUSCULAR | Status: DC | PRN

## 2023-05-12 MED ORDER — PANTOPRAZOLE SODIUM 40 MG IV SOLR
40.0000 mg | Freq: Two times a day (BID) | INTRAVENOUS | Status: DC
  Administered 2023-05-12 – 2023-05-14 (×4): 40 mg via INTRAVENOUS
  Filled 2023-05-12 (×4): qty 10

## 2023-05-12 NOTE — ED Notes (Signed)
Pt ambulate to the bathroom.  

## 2023-05-12 NOTE — ED Triage Notes (Signed)
 Pt with a hx of constipation had N/V followed by a hard BM last night that was followed by diarrhea. She then started having rectal bleeding with dark blood clots. She reports associated fatigue and abd cramping. She has not had anymore N/V since having a BM. She took a magnesium supplement to help with the constipation last PM.

## 2023-05-12 NOTE — ED Provider Triage Note (Signed)
 Emergency Medicine Provider Triage Evaluation Note  [name] [name] , a 63 y.o. female  was evaluated in triage.  Pt complains of rectal bleeding.  Patient has history of colon cancer in the past and has been having rectal bleeding with blood clots that began this morning.  Since then she has been having abdominal pain and been going to the bathroom every 30 minutes or so.  Review of Systems  Positive: Abdominal pain, blood clots in stools Negative: Blood thinner use  Physical Exam  BP (!) 157/102 (BP Location: Right Arm)   Pulse 85   Temp 98.4 F (36.9 C)   Resp (!) 22   Ht 5' (1.524 m)   Wt 81.6 kg   SpO2 100%   BMI 35.15 kg/m  Gen:   Awake, no distress   Resp:  Normal effort  MSK:   Moves extremities without difficulty  Other:    Medical Decision Making  Medically screening exam initiated at 1:44 PM.  Appropriate orders placed.  [name] [name] was informed that the remainder of the evaluation will be completed by another provider, this initial triage assessment does not replace that evaluation, and the importance of remaining in the ED until their evaluation is complete.   Maxwell Marion, PA-C 05/12/23 1345

## 2023-05-12 NOTE — ED Provider Notes (Signed)
 [place] EMERGENCY DEPARTMENT AT [facility] Provider Note   CSN: [id number] Arrival date & time: 05/12/23  1303     History  Chief Complaint  Patient presents with   GI Bleeding    [name] [name] is a 63 y.o. female.  Patient complains of rectal bleeding today.  Patient reports that she had constipation and abdominal pain last p.m.  Patient reports that she had nausea and vomiting.  Patient reports that she did have a large bowel movement and since that time has had rectal bleeding.  Patient reports that she is passing large dark clots.  Patient reports that when she wipes the tissue is pink but that the blood that she is passing into the toilet is dark.  Patient reports that she has a history of colon cancer.  Patient has been seen by GI in the past.  Patient denies any fever or chills.  Patient has some abdominal discomfort.  Patient has continued to pass dark clots while here in the emergency department.  Patient has been seen by [facility] in the past.  The history is provided by the patient. No language interpreter was used.       Home Medications Prior to Admission medications   Medication Sig Start Date End Date Taking? Authorizing Provider  atorvastatin (LIPITOR) 20 MG tablet Take 1 tablet (20 mg total) by mouth daily. Must see provider for further refills. 06/18/22   Henson, Vickie L, NP-C  Biotin 1 MG CAPS Take by mouth.    [provider]  Continuous Blood Gluc Sensor (FREESTYLE LIBRE 3 SENSOR) MISC SMARTSIG:1 Topical Every 2 Weeks 09/29/21   Corwin Levins, MD  Continuous Glucose Sensor (FREESTYLE LIBRE 3 SENSOR) MISC Apply 1 sensor to the skin every 14 days for continuous glucose monitoring. 02/06/23     Dulaglutide (TRULICITY) 3 MG/0.5ML SOAJ Inject 3 mg into the skin once a week. 02/12/23     glucose blood test strip Use as instructed 09/01/15   Rasch, Victorino Dike I, NP  ibuprofen (ADVIL,MOTRIN) 200 MG tablet Take 400 mg by mouth every 6 (six) hours as  needed for mild pain or moderate pain. Reported on 07/12/2015    [provider]  insulin degludec (TRESIBA) 200 UNIT/ML FlexTouch Pen Inject 20 Units into the skin daily. 08/30/21   [provider]  Insulin Pen Needle 32G X 4 MM MISC Use as directed 05/25/21   Hetty Blend L, NP-C  Lancets (ONETOUCH ULTRASOFT) lancets Use as instructed 09/01/15   Rasch, Victorino Dike I, NP  metFORMIN (GLUCOPHAGE) 1000 MG tablet Take 1 tablet (1,000 mg total) by mouth 2 (two) times daily with a meal. 08/30/21     metFORMIN (GLUCOPHAGE-XR) 500 MG 24 hr tablet Take 2 tablets (1,000 mg total) by mouth daily with breakfast. 07/16/22     Multiple Vitamin (MULTIVITAMIN WITH MINERALS) TABS tablet Take 1 tablet by mouth daily.    [provider]  nystatin cream (MYCOSTATIN) Apply 1 Application topically 2 (two) times daily. 12/25/21   Henson, Vickie L, NP-C  triamcinolone cream (KENALOG) 0.1 % APPLY TO AFFECTED AREA TWICE A DAY 12/25/21   Henson, Vickie L, NP-C  Vitamin D, Ergocalciferol, (DRISDOL) 1.25 MG (50000 UNIT) CAPS capsule Take 1 capsule (50,000 Units total) by mouth every 7 (seven) days. 04/11/23   Henson, Vickie L, NP-C      Allergies    Epinephrine, Lidocaine, Lisinopril, and Metformin and related    Review of Systems   Review of Systems  Gastrointestinal:  Positive for anal bleeding.  All other systems reviewed and are negative.   Physical Exam Updated Vital Signs BP (!) 143/71 (BP Location: Right Arm)   Pulse 81   Temp 98 F (36.7 C) (Oral)   Resp 17   Ht 5' (1.524 m)   Wt 81.6 kg   SpO2 100%   BMI 35.15 kg/m  Physical Exam Vitals and nursing note reviewed.  Constitutional:      Appearance: She is well-developed.  HENT:     Head: Normocephalic.  Cardiovascular:     Rate and Rhythm: Normal rate.  Pulmonary:     Effort: Pulmonary effort is normal.  Abdominal:     General: There is no distension.  Genitourinary:    Rectum: Guaiac result positive.  Musculoskeletal:         General: Normal range of motion.     Cervical back: Normal range of motion.  Skin:    General: Skin is warm.  Neurological:     General: No focal deficit present.     Mental Status: She is alert and oriented to person, place, and time.     ED Results / Procedures / Treatments   Labs (all labs ordered are listed, but only abnormal results are displayed) Labs Reviewed  COMPREHENSIVE METABOLIC PANEL - Abnormal; Notable for the following components:      Result Value   Glucose, Bld 206 (*)    BUN 7 (*)    All other components within normal limits  CBC - Abnormal; Notable for the following components:   RBC 5.86 (*)    MCV 72.7 (*)    MCH 22.0 (*)    All other components within normal limits  POC OCCULT BLOOD, ED - Abnormal; Notable for the following components:   Fecal Occult Bld POSITIVE (*)    All other components within normal limits  TYPE AND SCREEN    EKG None  Radiology CT Angio Abd/Pel W and/or Wo Contrast Result Date: 05/12/2023 CLINICAL DATA:  Rectal bleeding since this morning, history of colon cancer * Tracking Code: BO * EXAM: CTA ABDOMEN AND PELVIS WITHOUT AND WITH CONTRAST TECHNIQUE: Multidetector CT imaging of the abdomen and pelvis was performed using the standard protocol during bolus administration of intravenous contrast. Multiplanar reconstructed images and MIPs were obtained and reviewed to evaluate the vascular anatomy. RADIATION DOSE REDUCTION: This exam was performed according to the departmental dose-optimization program which includes automated exposure control, adjustment of the mA and/or kV according to patient size and/or use of iterative reconstruction technique. CONTRAST:  OMNIPAQUE IOHEXOL 350 MG/ML SOLN COMPARISON:  09/08/2020 FINDINGS: VASCULAR Normal contour and caliber of the abdominal aorta. No evidence of aneurysm, dissection, or other acute aortic pathology. Standard branching pattern of the abdominal aorta with solitary bilateral renal  arteries. Review of the MIP images confirms the above findings. NON-VASCULAR Lower Chest: No acute findings. Hepatobiliary: No solid liver abnormality is seen. Hepatic steatosis. No gallstones, gallbladder wall thickening, or biliary dilatation. Pancreas: Unremarkable. No pancreatic ductal dilatation or surrounding inflammatory changes. Spleen: Normal in size without significant abnormality. Adrenals/Urinary Tract: Adrenal glands are unremarkable. Kidneys are normal, without renal calculi, solid lesion, or hydronephrosis. Unchanged wall thickening of the urinary bladder, possibly due to local radiation therapy (series 12, image 178). Stomach/Bowel: Stomach is within normal limits. Appendix appears normal. Long segment wall thickening mucosal hyperenhancement of the transverse and descending colon (series 12, image 100). Unchanged postoperative and post treatment appearance of the low pelvis status  post low anterior resection with extensive presacral soft tissue thickening (series 12, image 170). No intraluminal contrast extravasation or other findings to specifically localize nidus of GI bleeding. Lymphatic: No enlarged abdominal or pelvic lymph nodes. Reproductive: No mass or other significant abnormality. Other: Broad-based midline ventral hernia.  No ascites. Musculoskeletal: No acute osseous findings. IMPRESSION: 1. Long segment wall thickening and mucosal hyperenhancement of the transverse and descending colon, consistent with nonspecific infectious or inflammatory colitis. 2. Unchanged postoperative and post treatment appearance of the low pelvis status post low anterior resection with extensive presacral soft tissue thickening. 3. No intraluminal contrast extravasation or other findings to specifically localize nidus of GI bleeding. 4. Hepatic steatosis. Electronically Signed   By: Jearld Lesch M.D.   On: 05/12/2023 17:00    Procedures Procedures    Medications Ordered in ED Medications  iohexol  (OMNIPAQUE) 350 MG/ML injection 100 mL (100 mLs Intravenous Contrast Given 05/12/23 1634)    ED Course/ Medical Decision Making/ A&P                                 Medical Decision Making Patient complains of lower abdominal pain.  Patient reports that she has been passing large dark clots of blood.  Amount and/or Complexity of Data Reviewed Labs: ordered. Decision-making details documented in ED Course.    Details: Labs ordered reviewed and interpreted hemoglobin is 12.9.  Patient is Hemoccult positive Radiology: ordered and independent interpretation performed. Decision-making details documented in ED Course.    Details: CT scan shows long segment wall thickening and mucosal hyperenhancement. Discussion of management or test interpretation with external provider(s): Hospitalist consulted as patient is still having bleeding. GI consulted and will see pt in the am   Risk Decision regarding hospitalization.           Final Clinical Impression(s) / ED Diagnoses Final diagnoses:  Rectal bleeding  Generalized abdominal pain    Rx / DC Orders ED Discharge Orders     None         Osie Cheeks 05/12/23 2152    Loetta Rough, MD 05/12/23 2253

## 2023-05-12 NOTE — H&P (Signed)
 History and Physical    [name] [name] QVZ:[id number] DOB: [birth year] DOA: 05/12/2023  PCP: Avanell Shackleton, NP-C  Patient coming from: home  I have personally briefly reviewed patient's old medical records in [facility]  Chief Complaint: change in mental status   HPI: [name] [name] is a 63 y.o. female with medical history significant of  DMII, rectal cancer, Anemia, HA , presents to ED with history of constipation associated with increase n/v. Patient states she also had associated lower abdominal cramping. Patient states she had a large hard bowel movement and then after this noted large clots with recurrent episodes.  Due to this she presented to ED for further evaluation.  Patient of note is not on any blood thinners or nsaids.   ED Course:  Afeb, BP 157/102, HR 85, rr 22  sat 100% on ra  Wbc 9.6, hgb 12.9, MCV72.7, PLT 252 Na 139, K 4.1, bicarb 24, glu 206, cr 0.87 AST 28, AlT 23 WBC 9.6, hgb 12.9, MCV72.7, plt 252 FOB+ MPRESSION: 1. Long segment wall thickening and mucosal hyperenhancement of the transverse and descending colon, consistent with nonspecific infectious or inflammatory colitis. 2. Unchanged postoperative and post treatment appearance of the low pelvis status post low anterior resection with extensive presacral soft tissue thickening. 3. No intraluminal contrast extravasation or other findings to specifically localize nidus of GI bleeding. 4. Hepatic steatosis. Review of Systems: As per HPI otherwise 10 point review of systems negative.   Past Medical History:  Diagnosis Date   Abdominal hernia    4 hernias per pt   Abdominal pain    mid abd pain radiating to right side   Anemia    Cancer (HCC)    rectal ca   Change in bowel movement    Colon cancer (HCC)    Complication of anesthesia    cardiac arrest during nasal surgery   Diabetes mellitus without complication (HCC)    Headache    History of rectal bleeding     Past Surgical History:   Procedure Laterality Date   CESAREAN SECTION     colon cancer     COLON SURGERY     COLONOSCOPY     LAPAROSCOPIC LYSIS OF ADHESIONS  04/02/2014   Procedure: LAPAROSCOPIC LYSIS OF ADHESIONS;  Surgeon: Avel Peace, MD;  Location: [facility];  Service: General;;   NOSE SURGERY     POLYPECTOMY     VENTRAL HERNIA REPAIR N/A 04/02/2014   Procedure: LAPAROSCOPIC  LYSIS OF ADHESIONS AND REPAIR OF VENTRAL INCISIONAL HERNIAS WITH MESH;  Surgeon: Avel Peace, MD;  Location: [facility]S;  Service: General;  Laterality: N/A;     reports that she quit smoking about 20 years ago. Her smoking use included cigarettes. She started smoking about 33 years ago. She has a 3.3 pack-year smoking history. She has never used smokeless tobacco. She reports current alcohol use. She reports that she does not use drugs.  Allergies  Allergen Reactions   Epinephrine Anaphylaxis   Lidocaine Anaphylaxis   Lisinopril     itching   Metformin And Related     GI upset    Family History  Problem Relation Age of Onset   Uterine cancer Mother    Colon cancer Maternal Aunt    Breast cancer Paternal Aunt    Colon cancer Maternal Grandmother    Breast cancer Cousin    Breast cancer Cousin    Diabetes Neg Hx    Rectal cancer Neg Hx  Stomach cancer Neg Hx     Prior to Admission medications   Medication Sig Start Date End Date Taking? Authorizing Provider  atorvastatin (LIPITOR) 20 MG tablet Take 1 tablet (20 mg total) by mouth daily. Must see provider for further refills. 06/18/22   Henson, Vickie L, NP-C  Biotin 1 MG CAPS Take by mouth.    [provider]  Continuous Blood Gluc Sensor (FREESTYLE LIBRE 3 SENSOR) MISC SMARTSIG:1 Topical Every 2 Weeks 09/29/21   Corwin Levins, MD  Continuous Glucose Sensor (FREESTYLE LIBRE 3 SENSOR) MISC Apply 1 sensor to the skin every 14 days for continuous glucose monitoring. 02/06/23     Dulaglutide (TRULICITY) 3 MG/0.5ML SOAJ Inject 3 mg into the skin once a week. 02/12/23      glucose blood test strip Use as instructed 09/01/15   Rasch, Victorino Dike I, NP  ibuprofen (ADVIL,MOTRIN) 200 MG tablet Take 400 mg by mouth every 6 (six) hours as needed for mild pain or moderate pain. Reported on 07/12/2015    [provider]  insulin degludec (TRESIBA) 200 UNIT/ML FlexTouch Pen Inject 20 Units into the skin daily. 08/30/21   [provider]  Insulin Pen Needle 32G X 4 MM MISC Use as directed 05/25/21   Hetty Blend L, NP-C  Lancets (ONETOUCH ULTRASOFT) lancets Use as instructed 09/01/15   Rasch, Victorino Dike I, NP  metFORMIN (GLUCOPHAGE) 1000 MG tablet Take 1 tablet (1,000 mg total) by mouth 2 (two) times daily with a meal. 08/30/21     metFORMIN (GLUCOPHAGE-XR) 500 MG 24 hr tablet Take 2 tablets (1,000 mg total) by mouth daily with breakfast. 07/16/22     Multiple Vitamin (MULTIVITAMIN WITH MINERALS) TABS tablet Take 1 tablet by mouth daily.    [provider]  nystatin cream (MYCOSTATIN) Apply 1 Application topically 2 (two) times daily. 12/25/21   Henson, Vickie L, NP-C  triamcinolone cream (KENALOG) 0.1 % APPLY TO AFFECTED AREA TWICE A DAY 12/25/21   Henson, Vickie L, NP-C  Vitamin D, Ergocalciferol, (DRISDOL) 1.25 MG (50000 UNIT) CAPS capsule Take 1 capsule (50,000 Units total) by mouth every 7 (seven) days. 04/11/23   Avanell Shackleton, NP-C    Physical Exam: Vitals:   05/12/23 1328 05/12/23 1334 05/12/23 1837  BP: (!) 157/102  (!) 143/71  Pulse: 85  81  Resp: (!) 22  17  Temp: 98.4 F (36.9 C)  98 F (36.7 C)  TempSrc:   Oral  SpO2: 100%  100%  Weight:  81.6 kg   Height:  5' (1.524 m)     Constitutional: NAD, calm, comfortable Vitals:   05/12/23 1328 05/12/23 1334 05/12/23 1837  BP: (!) 157/102  (!) 143/71  Pulse: 85  81  Resp: (!) 22  17  Temp: 98.4 F (36.9 C)  98 F (36.7 C)  TempSrc:   Oral  SpO2: 100%  100%  Weight:  81.6 kg   Height:  5' (1.524 m)    Eyes: PERRL, lids and conjunctivae normal ENMT: Mucous membranes are moist.  Posterior pharynx clear of any exudate or lesions.Normal dentition.  Neck: normal, supple, no masses, no thyromegaly Respiratory: clear to auscultation bilaterally, no wheezing, no crackles. Normal respiratory effort. No accessory muscle use.  Cardiovascular: Regular rate and rhythm, no murmurs / rubs / gallops. No extremity edema. 2+ pedal pulses. No carotid bruits.  Abdomen: no tenderness, no masses palpated. No hepatosplenomegaly. Bowel sounds positive.  Musculoskeletal: no clubbing / cyanosis. No joint deformity upper and lower extremities. Good  ROM, no contractures. Normal muscle tone.  Skin: no rashes, lesions, ulcers. No induration Neurologic: CN 2-12 grossly intact. Sensation intact, DTR normal. Strength 5/5 in all 4.  Psychiatric: Normal judgment and insight. Alert and oriented x 3. Normal mood.    Labs on Admission: I have personally reviewed following labs and imaging studies  CBC: Recent Labs  Lab 05/12/23 1334  WBC 9.6  HGB 12.9  HCT 42.6  MCV 72.7*  PLT 252   Basic Metabolic Panel: Recent Labs  Lab 05/12/23 1334  NA 139  K 4.1  CL 104  CO2 24  GLUCOSE 206*  BUN 7*  CREATININE 0.87  CALCIUM 9.2   GFR: Estimated Creatinine Clearance: 61.8 mL/min (by C-G formula based on SCr of 0.87 mg/dL). Liver Function Tests: Recent Labs  Lab 05/12/23 1334  AST 28  ALT 23  ALKPHOS 67  BILITOT 0.6  PROT 6.8  ALBUMIN 3.7   No results for input(s): "LIPASE", "AMYLASE" in the last 168 hours. No results for input(s): "AMMONIA" in the last 168 hours. Coagulation Profile: No results for input(s): "INR", "PROTIME" in the last 168 hours. Cardiac Enzymes: No results for input(s): "CKTOTAL", "CKMB", "CKMBINDEX", "TROPONINI" in the last 168 hours. BNP (last 3 results) No results for input(s): "PROBNP" in the last 8760 hours. HbA1C: No results for input(s): "HGBA1C" in the last 72 hours. CBG: No results for input(s): "GLUCAP" in the last 168 hours. Lipid Profile: No  results for input(s): "CHOL", "HDL", "LDLCALC", "TRIG", "CHOLHDL", "LDLDIRECT" in the last 72 hours. Thyroid Function Tests: No results for input(s): "TSH", "T4TOTAL", "FREET4", "T3FREE", "THYROIDAB" in the last 72 hours. Anemia Panel: No results for input(s): "VITAMINB12", "FOLATE", "FERRITIN", "TIBC", "IRON", "RETICCTPCT" in the last 72 hours. Urine analysis:    Component Value Date/Time   COLORURINE YELLOW 08/17/2021 1245   APPEARANCEUR Sl Cloudy (A) 08/17/2021 1245   LABSPEC 1.020 08/17/2021 1245   LABSPEC 1.030 08/12/2006 1420   PHURINE 6.0 08/17/2021 1245   GLUCOSEU >=1000 (A) 08/17/2021 1245   HGBUR TRACE-LYSED (A) 08/17/2021 1245   BILIRUBINUR NEGATIVE 08/17/2021 1245   BILIRUBINUR negative 05/25/2021 1604   BILIRUBINUR Negative 08/12/2006 1420   KETONESUR NEGATIVE 08/17/2021 1245   PROTEINUR Negative 05/25/2021 1604   PROTEINUR NEGATIVE 05/23/2021 1613   UROBILINOGEN 0.2 08/17/2021 1245   NITRITE NEGATIVE 08/17/2021 1245   LEUKOCYTESUR NEGATIVE 08/17/2021 1245   LEUKOCYTESUR Trace 08/12/2006 1420    Radiological Exams on Admission: CT Angio Abd/Pel W and/or Wo Contrast Result Date: 05/12/2023 CLINICAL DATA:  Rectal bleeding since this morning, history of colon cancer * Tracking Code: BO * EXAM: CTA ABDOMEN AND PELVIS WITHOUT AND WITH CONTRAST TECHNIQUE: Multidetector CT imaging of the abdomen and pelvis was performed using the standard protocol during bolus administration of intravenous contrast. Multiplanar reconstructed images and MIPs were obtained and reviewed to evaluate the vascular anatomy. RADIATION DOSE REDUCTION: This exam was performed according to the departmental dose-optimization program which includes automated exposure control, adjustment of the mA and/or kV according to patient size and/or use of iterative reconstruction technique. CONTRAST:  OMNIPAQUE IOHEXOL 350 MG/ML SOLN COMPARISON:  09/08/2020 FINDINGS: VASCULAR Normal contour and caliber of the  abdominal aorta. No evidence of aneurysm, dissection, or other acute aortic pathology. Standard branching pattern of the abdominal aorta with solitary bilateral renal arteries. Review of the MIP images confirms the above findings. NON-VASCULAR Lower Chest: No acute findings. Hepatobiliary: No solid liver abnormality is seen. Hepatic steatosis. No gallstones, gallbladder wall thickening, or biliary dilatation. Pancreas:  Unremarkable. No pancreatic ductal dilatation or surrounding inflammatory changes. Spleen: Normal in size without significant abnormality. Adrenals/Urinary Tract: Adrenal glands are unremarkable. Kidneys are normal, without renal calculi, solid lesion, or hydronephrosis. Unchanged wall thickening of the urinary bladder, possibly due to local radiation therapy (series 12, image 178). Stomach/Bowel: Stomach is within normal limits. Appendix appears normal. Long segment wall thickening mucosal hyperenhancement of the transverse and descending colon (series 12, image 100). Unchanged postoperative and post treatment appearance of the low pelvis status post low anterior resection with extensive presacral soft tissue thickening (series 12, image 170). No intraluminal contrast extravasation or other findings to specifically localize nidus of GI bleeding. Lymphatic: No enlarged abdominal or pelvic lymph nodes. Reproductive: No mass or other significant abnormality. Other: Broad-based midline ventral hernia.  No ascites. Musculoskeletal: No acute osseous findings. IMPRESSION: 1. Long segment wall thickening and mucosal hyperenhancement of the transverse and descending colon, consistent with nonspecific infectious or inflammatory colitis. 2. Unchanged postoperative and post treatment appearance of the low pelvis status post low anterior resection with extensive presacral soft tissue thickening. 3. No intraluminal contrast extravasation or other findings to specifically localize nidus of GI bleeding. 4. Hepatic  steatosis. Electronically Signed   By: Jearld Lesch M.D.   On: 05/12/2023 17:00    EKG: Independently reviewed. ***  Assessment/Plan  Lower GI bleed -hx of Rectal CA  -CT scan noting area of inflammation with concern from possible colitis -note stable h/h  - no nsaids of anticoagulation  - admit to progressive care  - ppi for gi protection  - monitor h/h transfuse if < 7  -GI to see in am  -clear, npo midnight   Probable Colitis  -no wbc /no fever  -f/u with lactic acid/ fecal calprotectin/fecal leukocytes /gi panel -will start on ctx/flagyl , de-escalate as able  -await gi further recs   DMII -iss/fs  -hold oral hypoglycemics  HLD -resumes statin as able    Rectal CA  Anemia -stable   HA -no active issues       DVT prophylaxis: scd Code Status: full/ as discussed per patient wishes in event of cardiac arrest  Family Communication: full/ as discussed per patient wishes in event of cardiac arrest  Disposition Plan: patient  expected to be admitted greater than 2 midnights  Consults called:  [facility] , Dr Barron Alvine Admission status: progressive care   Lurline Del MD [facility]   If 7PM-7AM, please contact night-coverage [email or web address] Password [facility]  05/12/2023, 8:29 PM

## 2023-05-13 DIAGNOSIS — R933 Abnormal findings on diagnostic imaging of other parts of digestive tract: Secondary | ICD-10-CM

## 2023-05-13 DIAGNOSIS — R109 Unspecified abdominal pain: Secondary | ICD-10-CM | POA: Diagnosis not present

## 2023-05-13 DIAGNOSIS — R112 Nausea with vomiting, unspecified: Secondary | ICD-10-CM

## 2023-05-13 DIAGNOSIS — R61 Generalized hyperhidrosis: Secondary | ICD-10-CM

## 2023-05-13 DIAGNOSIS — K625 Hemorrhage of anus and rectum: Secondary | ICD-10-CM | POA: Diagnosis not present

## 2023-05-13 LAB — CBG MONITORING, ED
Glucose-Capillary: 165 mg/dL — ABNORMAL HIGH (ref 70–99)
Glucose-Capillary: 144 mg/dL — ABNORMAL HIGH (ref 70–99)
Glucose-Capillary: 213 mg/dL — ABNORMAL HIGH (ref 70–99)

## 2023-05-13 LAB — COMPREHENSIVE METABOLIC PANEL
ALT: 19 U/L (ref 0–44)
AST: 20 U/L (ref 15–41)
Albumin: 3.3 g/dL — ABNORMAL LOW (ref 3.5–5.0)
Alkaline Phosphatase: 57 U/L (ref 38–126)
Anion gap: 10 (ref 5–15)
BUN: 7 mg/dL — ABNORMAL LOW (ref 8–23)
CO2: 24 mmol/L (ref 22–32)
Calcium: 8.3 mg/dL — ABNORMAL LOW (ref 8.9–10.3)
Chloride: 108 mmol/L (ref 98–111)
Creatinine, Ser: 0.86 mg/dL (ref 0.44–1.00)
GFR, Estimated: >60 mL/min (ref >60)
Glucose, Bld: 119 mg/dL — ABNORMAL HIGH (ref 70–99)
Potassium: 3.4 mmol/L — ABNORMAL LOW (ref 3.5–5.1)
Sodium: 142 mmol/L (ref 135–145)
Total Bilirubin: 0.6 mg/dL (ref 0.0–1.2)
Total Protein: 6.2 g/dL — ABNORMAL LOW (ref 6.5–8.1)

## 2023-05-13 LAB — RESPIRATORY PANEL BY PCR

## 2023-05-13 LAB — HEMOGLOBIN A1C
Hgb A1c MFr Bld: 8.4 % — ABNORMAL HIGH (ref 4.8–5.6)
Mean Plasma Glucose: 194.38 mg/dL

## 2023-05-13 LAB — CBC
HCT: 38.7 % (ref 36.0–46.0)
Hemoglobin: 12 g/dL (ref 12.0–15.0)
MCH: 22.3 pg — ABNORMAL LOW (ref 26.0–34.0)
MCHC: 31 g/dL (ref 30.0–36.0)
MCV: 71.9 fL — ABNORMAL LOW (ref 80.0–100.0)
Platelets: 216 10*3/uL (ref 150–400)
RBC: 5.38 MIL/uL — ABNORMAL HIGH (ref 3.87–5.11)
RDW: 14.6 % (ref 11.5–15.5)
WBC: 6.3 10*3/uL (ref 4.0–10.5)
nRBC: 0 % (ref 0.0–0.2)

## 2023-05-13 LAB — GLUCOSE, CAPILLARY
Glucose-Capillary: 126 mg/dL — ABNORMAL HIGH (ref 70–99)
Glucose-Capillary: 189 mg/dL — ABNORMAL HIGH (ref 70–99)

## 2023-05-13 LAB — MAGNESIUM: Magnesium: 1.7 mg/dL (ref 1.7–2.4)

## 2023-05-13 LAB — HEMOGLOBIN AND HEMATOCRIT, BLOOD
HCT: 38.7 % (ref 36.0–46.0)
Hemoglobin: 11.8 g/dL — ABNORMAL LOW (ref 12.0–15.0)

## 2023-05-13 LAB — LACTIC ACID, PLASMA: Lactic Acid, Venous: 1.8 mmol/L (ref 0.5–1.9)

## 2023-05-13 LAB — SEDIMENTATION RATE: Sed Rate: 3 mm/h (ref 0–22)

## 2023-05-13 MED ORDER — DICYCLOMINE HCL 10 MG PO CAPS
10.0000 mg | ORAL_CAPSULE | Freq: Four times a day (QID) | ORAL | Status: DC | PRN
Start: 2023-05-13 — End: 2023-05-14

## 2023-05-13 MED ORDER — INSULIN ASPART 100 UNIT/ML IJ SOLN
0.0000 [IU] | Freq: Three times a day (TID) | INTRAMUSCULAR | Status: DC
  Administered 2023-05-13: 5 [IU] via SUBCUTANEOUS

## 2023-05-13 MED ORDER — INSULIN GLARGINE 100 UNIT/ML ~~LOC~~ SOLN
10.0000 [IU] | Freq: Every day | SUBCUTANEOUS | Status: DC
  Administered 2023-05-13 – 2023-05-14 (×2): 10 [IU] via SUBCUTANEOUS
  Filled 2023-05-13 (×2): qty 0.1

## 2023-05-13 MED ORDER — POTASSIUM CHLORIDE CRYS ER 20 MEQ PO TBCR
60.0000 meq | EXTENDED_RELEASE_TABLET | Freq: Once | ORAL | Status: AC
  Administered 2023-05-13: 60 meq via ORAL
  Filled 2023-05-13: qty 3

## 2023-05-13 MED ORDER — HYDRALAZINE HCL 20 MG/ML IJ SOLN: 10.0000 mg | Freq: Four times a day (QID) | INTRAMUSCULAR | Status: DC | PRN

## 2023-05-13 MED ORDER — INSULIN ASPART 100 UNIT/ML IJ SOLN: 0.0000 [IU] | Freq: Every day | INTRAMUSCULAR | Status: DC

## 2023-05-13 NOTE — Consult Note (Signed)
 Consultation  Referring Provider: No ref. provider found Primary Care Physician:  Avanell Shackleton, NP-C Primary Gastroenterologist:  Dr.Armbruster  Reason for Consultation: Abdominal cramping, rectal bleeding, abnormal CT  HPI: [name] [name] is a 63 y.o. female to Dr. Adela Lank who has history of rectal cancer which was diagnosed in 2007, she is status post low anterior resection, and has been in remission.  She also has history of multiple ventral hernias and is status post hernia repair with mesh.  Other medical problems include diabetes mellitus and hyperlipidemia. Patient presented to the emergency room last evening with complaint of onset of symptoms on Saturday evening with fairly severe abdominal cramping, followed by urge for bowel movement with passage of initially solid stool, then loose stool and then noted blood and clots.  She had associated diaphoresis nausea and vomiting with this episode. She relates that she has been having episodes over the past several years about once every 30 days or so with what she describes as abdominal cramping and a very unsettled feeling in her abdomen but no definite urge for bowel movement.  She may struggle with this for 3 to 4 hours and then finally started having bowel movements which will usually be hard then progressed onto soft to liquid stool.  She is not certain what triggers these episodes, and generally has no warning. In between these episodes she feels fine but usually has 2 or 3 small bowel movements per day.  She says ever since her surgery she just does not have much urge for bowel movement. The episode that happened on Saturday was different because of the passage of blood and clots and also because of diaphoresis nausea and vomiting. She continued to pass blood and clots and have abdominal cramping yesterday though says that the cramping is improving at this point. She underwent CT angio of the abdomen yesterday which showed a  long segment of wall thickening and hyperenhancement in the transverse and descending colon consistent with an inflammatory or infectious colitis, she status post low anterior resection with extensive presacral soft tissue changes.  On arrival yesterday WBC 9.6/hemoglobin 12.9/hematocrit 42.6/MCV 72 Potassium 4.1/glucose 206/BUN 7/creatinine 0.87 LFTs within normal limits GI path panel ordered Lactate 1.8  Today hemoglobin 11.8/hematocrit 38  Patient up to date with colonoscopy last done July 2022 with 1 diminutive polyp removed and one 4 mm polyp removed few diverticuli noted in the sigmoid colon and into and colocolonic anastomosis Path on the polyps consistent with a tubular adenoma and a hyperplastic polyp and indicated for 5-year interval follow-up    Past Medical History:  Diagnosis Date   Abdominal hernia    4 hernias per pt   Abdominal pain    mid abd pain radiating to right side   Anemia    Cancer (HCC)    rectal ca   Change in bowel movement    Colon cancer (HCC)    Complication of anesthesia    cardiac arrest during nasal surgery   Diabetes mellitus without complication (HCC)    Headache    History of rectal bleeding     Past Surgical History:  Procedure Laterality Date   CESAREAN SECTION     colon cancer     COLON SURGERY     COLONOSCOPY     LAPAROSCOPIC LYSIS OF ADHESIONS  04/02/2014   Procedure: LAPAROSCOPIC LYSIS OF ADHESIONS;  Surgeon: Avel Peace, MD;  Location: [facility];  Service: General;;   NOSE SURGERY  POLYPECTOMY     VENTRAL HERNIA REPAIR N/A 04/02/2014   Procedure: LAPAROSCOPIC  LYSIS OF ADHESIONS AND REPAIR OF VENTRAL INCISIONAL HERNIAS WITH MESH;  Surgeon: Avel Peace, MD;  Location: [facility];  Service: General;  Laterality: N/A;    Prior to Admission medications   Medication Sig Start Date End Date Taking? Authorizing Provider  atorvastatin (LIPITOR) 20 MG tablet Take 1 tablet (20 mg total) by mouth daily. Must see provider for further  refills. 06/18/22   Henson, Vickie L, NP-C  Biotin 1 MG CAPS Take 1 capsule by mouth daily.    [provider]  Continuous Glucose Sensor (FREESTYLE LIBRE 3 SENSOR) MISC Apply 1 sensor to the skin every 14 days for continuous glucose monitoring. 02/06/23     Dulaglutide (TRULICITY) 3 MG/0.5ML SOAJ Inject 3 mg into the skin once a week. 02/12/23     ibuprofen (ADVIL,MOTRIN) 200 MG tablet Take 400 mg by mouth every 6 (six) hours as needed for mild pain or moderate pain. Reported on 07/12/2015    [provider]  insulin degludec (TRESIBA) 200 UNIT/ML FlexTouch Pen Inject 20 Units into the skin daily. 08/30/21   [provider]  metFORMIN (GLUCOPHAGE) 1000 MG tablet Take 1 tablet (1,000 mg total) by mouth 2 (two) times daily with a meal. 08/30/21     metFORMIN (GLUCOPHAGE-XR) 500 MG 24 hr tablet Take 2 tablets (1,000 mg total) by mouth daily with breakfast. 07/16/22     Multiple Vitamin (MULTIVITAMIN WITH MINERALS) TABS tablet Take 1 tablet by mouth daily.    [provider]  Vitamin D, Ergocalciferol, (DRISDOL) 1.25 MG (50000 UNIT) CAPS capsule Take 1 capsule (50,000 Units total) by mouth every 7 (seven) days. 04/11/23   Avanell Shackleton, NP-C    Current Facility-Administered Medications  Medication Dose Route Frequency Provider Last Rate Last Admin   0.9 %  sodium chloride infusion   Intravenous Continuous Lurline Del, MD 75 mL/hr at 05/13/23 0616 Rate Verify at 05/13/23 0616   acetaminophen (TYLENOL) tablet 650 mg  650 mg Oral Q6H PRN Lurline Del, MD       Or   acetaminophen (TYLENOL) suppository 650 mg  650 mg Rectal Q6H PRN Lurline Del, MD       albuterol (PROVENTIL) (2.5 MG/3ML) 0.083% nebulizer solution 2.5 mg  2.5 mg Nebulization Q2H PRN Skip Mayer A, MD       cefTRIAXone (ROCEPHIN) 2 g in sodium chloride 0.9 % 100 mL IVPB  2 g Intravenous Q24H Lurline Del, MD   Stopped at 05/12/23 2238   hydrALAZINE (APRESOLINE) injection 10  mg  10 mg Intravenous Q6H PRN Skip Mayer A, MD       insulin aspart (novoLOG) injection 0-9 Units  0-9 Units Subcutaneous Q4H Skip Mayer A, MD   2 Units at 05/13/23 0513   metroNIDAZOLE (FLAGYL) IVPB 500 mg  500 mg Intravenous Q12H Skip Mayer A, MD 100 mL/hr at 05/13/23 0832 500 mg at 05/13/23 0832   ondansetron (ZOFRAN) tablet 4 mg  4 mg Oral Q6H PRN Lurline Del, MD       Or   ondansetron [facility]) injection 4 mg  4 mg Intravenous Q6H PRN Lurline Del, MD       pantoprazole (PROTONIX) injection 40 mg  40 mg Intravenous Q12H Skip Mayer A, MD   40 mg at 05/13/23 0900   Current Outpatient Medications  Medication Sig Dispense Refill   atorvastatin (LIPITOR) 20 MG tablet Take  1 tablet (20 mg total) by mouth daily. Must see provider for further refills. 30 tablet 0   Biotin 1 MG CAPS Take 1 capsule by mouth daily.     Continuous Glucose Sensor (FREESTYLE LIBRE 3 SENSOR) MISC Apply 1 sensor to the skin every 14 days for continuous glucose monitoring. 2 each 11   Dulaglutide (TRULICITY) 3 MG/0.5ML SOAJ Inject 3 mg into the skin once a week. 6 mL 3   ibuprofen (ADVIL,MOTRIN) 200 MG tablet Take 400 mg by mouth every 6 (six) hours as needed for mild pain or moderate pain. Reported on 07/12/2015     insulin degludec (TRESIBA) 200 UNIT/ML FlexTouch Pen Inject 20 Units into the skin daily.     metFORMIN (GLUCOPHAGE) 1000 MG tablet Take 1 tablet (1,000 mg total) by mouth 2 (two) times daily with a meal. 360 tablet 3   metFORMIN (GLUCOPHAGE-XR) 500 MG 24 hr tablet Take 2 tablets (1,000 mg total) by mouth daily with breakfast. 180 tablet 3   Multiple Vitamin (MULTIVITAMIN WITH MINERALS) TABS tablet Take 1 tablet by mouth daily.     Vitamin D, Ergocalciferol, (DRISDOL) 1.25 MG (50000 UNIT) CAPS capsule Take 1 capsule (50,000 Units total) by mouth every 7 (seven) days. 4 capsule 2    Allergies as of 05/12/2023 - Review Complete 05/12/2023  Allergen Reaction Noted    Epinephrine Anaphylaxis 07/12/2010   Lidocaine Anaphylaxis 07/12/2010   Lisinopril  04/10/2019   Metformin and related  07/14/2018    Family History  Problem Relation Age of Onset   Uterine cancer Mother    Colon cancer Maternal Aunt    Breast cancer Paternal Aunt    Colon cancer Maternal Grandmother    Breast cancer Cousin    Breast cancer Cousin    Diabetes Neg Hx    Rectal cancer Neg Hx    Stomach cancer Neg Hx     Social History   Socioeconomic History   Marital status: Married    Spouse name: Not on file   Number of children: 3   Years of education: 12   Highest education level: Not on file  Occupational History   Occupation: Customer Service  Tobacco Use   Smoking status: Former    Current packs/day: 0.00    Average packs/day: 0.3 packs/day for 13.0 years (3.3 ttl pk-yrs)    Types: Cigarettes    Start date: 03/13/1990    Quit date: 03/14/2003    Years since quitting: 20.1   Smokeless tobacco: Never  Vaping Use   Vaping status: Some Days   Substances: Flavoring   Devices: flavored , non-nicotine  Substance and Sexual Activity   Alcohol use: Yes    Alcohol/week: 0.0 standard drinks of alcohol    Comment: occasional    Drug use: No   Sexual activity: Not Currently  Other Topics Concern   Not on file  Social History Narrative   Fun: Camping, Cruise    Denies abuse and feels safe at home.    Social Drivers of Corporate investment banker Strain: Not on file  Food Insecurity: Not on file  Transportation Needs: Not on file  Physical Activity: Not on file  Stress: Not on file  Social Connections: Not on file  Intimate Partner Violence: Not on file    Review of Systems: Pertinent positive and negative review of systems were noted in the above HPI section.  All other review of systems was otherwise negative.   Physical Exam: Vital signs in last  24 hours: Temp:  [97.9 F (36.6 C)-98.5 F (36.9 C)] 97.9 F (36.6 C) (03/10 0508) Pulse Rate:  [73-106] 82  (03/10 0815) Resp:  [10-37] 17 (03/10 0815) BP: (140-157)/(68-102) 140/80 (03/10 0506) SpO2:  [96 %-100 %] 98 % (03/10 0815) Weight:  [81.6 kg] 81.6 kg (03/09 1334)   General:   Alert,  Well-developed, well-nourished, older African-American female pleasant and cooperative in NAD Head:  Normocephalic and atraumatic. Eyes:  Sclera clear, no icterus.   Conjunctiva pink. Ears:  Normal auditory acuity. Nose:  No deformity, discharge,  or lesions. Mouth:  No deformity or lesions.   Neck:  Supple; no masses or thyromegaly. Lungs:  Clear throughout to auscultation.   No wheezes, crackles, or rhonchi.  Heart:  Regular rate and rhythm; no murmurs, clicks, rubs,  or gallops. Abdomen:  Soft, she is tender in the left mid left lower quadrant and suprapubic area no guarding or rebound BS active,nonpalp mass or hsm.  Midline incisional scars Rectal: Not done Msk:  Symmetrical without gross deformities. . Pulses:  Normal pulses noted. Extremities:  Without clubbing or edema. Neurologic:  Alert and  oriented x4;  grossly normal neurologically. Skin:  Intact without significant lesions or rashes.. Psych:  Alert and cooperative. Normal mood and affect.  Intake/Output from previous day: 03/09 0701 - 03/10 0700 In: 197.1 [IV Piggyback:197.1] Out: -  Intake/Output this shift: No intake/output data recorded.  Lab Results: Recent Labs    05/12/23 1334 05/12/23 2150 05/13/23 0534  WBC 9.6  --   --   HGB 12.9 13.0 11.8*  HCT 42.6 42.7 38.7  PLT 252  --   --    BMET Recent Labs    05/12/23 1334  NA 139  K 4.1  CL 104  CO2 24  GLUCOSE 206*  BUN 7*  CREATININE 0.87  CALCIUM 9.2   LFT Recent Labs    05/12/23 1334  PROT 6.8  ALBUMIN 3.7  AST 28  ALT 23  ALKPHOS 67  BILITOT 0.6   PT/INR No results for input(s): "LABPROT", "INR" in the last 72 hours. Hepatitis Panel No results for input(s): "HEPBSAG", "HCVAB", "HEPAIGM", "HEPBIGM" in the last 72 hours.   IMPRESSION:   #67  63 year old female with history of rectal cancer diagnosed 2007 status post low anterior resection and in remission over the past several years. Patient presents now with acute abdominal cramping, diaphoresis, nausea vomiting, and multiple bowel movements initially solid then diarrheal with dark red blood and clots.  Onset symptoms Saturday, 05/11/2023. 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  #2 intermittent similar episodes occurring every 30 days or so but less severe than this current episode felt secondary to IBS in the past  #3 diabetes  mellitus 4.  Hyperlipidemia  Plan; soft diet Needed trend hemoglobin and transfuse as indicated Will leave on IV antibiotics today (Rocephin and metronidazole) though suspect this is more of an inflammatory process than infectious.  Segmental ischemic colitis is generally self-limited and symptoms usually resolve within 4 to 5 days Start 10 mg every 6 hours as needed for abdominal pain/cramping  No plans for colonoscopy at this time unless she would continue to have  significant bleeding and/or symptomatically fail to improve.  GI will follow with you    Eydie Wormley EsterwoodPA-C  05/13/2023, 8:42 AM

## 2023-05-13 NOTE — ED Notes (Signed)
 Pt ambulated to the bathroom without assistance and completed ADL's

## 2023-05-13 NOTE — Plan of Care (Signed)

## 2023-05-13 NOTE — Progress Notes (Signed)
 PROGRESS NOTE  [name] [name] VWU:[id number] DOB: [birth year]   PCP: Avanell Shackleton, NP-C  Patient is from: Home.  DOA: 05/12/2023 LOS: 1  Chief complaints Chief Complaint  Patient presents with   GI Bleeding     Brief Narrative / Interim history: 64 year old F with PMH of colon cancer s/p resection, chemoradiation about 10 years ago, DM-2, obesity, anemia and headache presented to ED with rectal bleed.  Patient had severe cramping abdominal pain on Saturday followed by urge for bowel movement with passage of initially solid stool, then loose stool and then noted blood and clots. She had associated diaphoresis nausea and vomiting with this episode.  Has been passing blood clots since then.  Not on anticoagulation, antiplatelet or NSAID.  In ED, slightly hypertensive.  Hgb 12.9.  Hemoccult positive.  CT abdomen and pelvis raise concern for nonspecific colitis and hepatic steatosis.  Started on ceftriaxone and Flagyl.  GI consulted and patient was admitted for further evaluation.  Subjective: Seen and examined earlier this morning.  No major events overnight of this morning.  Objective: Vitals:   05/13/23 0815 05/13/23 0900 05/13/23 1000 05/13/23 1042  BP:  129/76 (!) 116/99   Pulse: 82 90 86 89  Resp: 17 (!) 21 17 (!) 21  Temp:    97.8 F (36.6 C)  TempSrc:      SpO2: 98% 100% 100% 100%  Weight:      Height:        Examination:  GENERAL: No apparent distress.  Nontoxic. HEENT: MMM.  Vision and hearing grossly intact.  NECK: Supple.  No apparent JVD.  RESP:  No IWOB.  Fair aeration bilaterally. CVS:  RRR. Heart sounds normal.  ABD/GI/GU: BS+. Abd soft.  Epigastric, suprapubic and right-sided abdominal tenderness.  No rebound or guarding. MSK/EXT:  Moves extremities. No apparent deformity. No edema.  SKIN: no apparent skin lesion or wound NEURO: Awake, alert and oriented appropriately.  No apparent focal neuro deficit. PSYCH: Calm. Normal affect.   Procedures:   None  Microbiology summarized: A 20 pathogen RVP nonreactive  Assessment and plan: Lower GI bleed/rectal bleed-imaging raises concern for nonspecific colitis.  Still passing small blood clots.  Patient with history of colon cancer s/p resection, chemoradiation about 10 years ago.  Last colonoscopy in 2022 and will be due in 2027.  Exam with suprapubic, epigastric, RLQ and RUQ tenderness.  No rebound or guarding. -Appreciate recs by GI  -Continue empiric antibiotics for 3 days  -Soft diet, IV fluid and pain control -Monitor H&H and transfuse for symptom anemia Hgb less than 7.0 -Continue Protonix -Follow morning labs  NIDDM-2 with hyperglycemia: A1c 8.4%.  On Trulicity at home Recent Labs  Lab 05/12/23 2151 05/13/23 0508 05/13/23 0739 05/13/23 1140  GLUCAP 135* 165* 144* 213*  -Increase SSI to moderate -Add Semglee 10 units daily -Continue home statin -Further adjustment as appropriate  Possible colitis: -Management as above.  Hyperlipidemia -Continue home statin   History of colon cancer s/p resection, chemoradiation over 10 years ago.  No evidence of recurrence   Headache: Seems to have resolved -Tylenol as needed  Class II obesity: On Trulicity. Body mass index is 35.15 kg/m. -Encourage lifestyle change          DVT prophylaxis:  SCDs Start: 05/12/23 2058  Code Status: Full code Family Communication: None at bedside Level of care: Progressive Status is: Inpatient Remains inpatient appropriate because: Rectal bleed, colitis   Final disposition: Home Consultants:  Gastro  55 minutes with  more than 50% spent in reviewing records, counseling patient/family and coordinating care.   Sch Meds:  Scheduled Meds:  insulin aspart  0-9 Units Subcutaneous Q4H   pantoprazole (PROTONIX) IV  40 mg Intravenous Q12H   Continuous Infusions:  sodium chloride 75 mL/hr at 05/13/23 0616   cefTRIAXone (ROCEPHIN)  IV Stopped (05/12/23 2238)   metronidazole Stopped  (05/13/23 0920)   PRN Meds:.acetaminophen **OR** acetaminophen, albuterol, dicyclomine, hydrALAZINE, ondansetron **OR** ondansetron (ZOFRAN) IV  Antimicrobials: Anti-infectives (From admission, onward)    Start     Dose/Rate Route Frequency Ordered Stop   05/12/23 2245  cefTRIAXone (ROCEPHIN) 2 g in sodium chloride 0.9 % 100 mL IVPB  Status:  Discontinued        2 g 200 mL/hr over 30 Minutes Intravenous Every 24 hours 05/12/23 2241 05/12/23 2244   05/12/23 2115  cefTRIAXone (ROCEPHIN) 2 g in sodium chloride 0.9 % 100 mL IVPB        2 g 200 mL/hr over 30 Minutes Intravenous Every 24 hours 05/12/23 2107     05/12/23 2115  metroNIDAZOLE (FLAGYL) IVPB 500 mg        500 mg 100 mL/hr over 60 Minutes Intravenous Every 12 hours 05/12/23 2107          I have personally reviewed the following labs and images: CBC: Recent Labs  Lab 05/12/23 1334 05/12/23 2150 05/13/23 0534  WBC 9.6  --   --   HGB 12.9 13.0 11.8*  HCT 42.6 42.7 38.7  MCV 72.7*  --   --   PLT 252  --   --    BMP &GFR Recent Labs  Lab 05/12/23 1334  NA 139  K 4.1  CL 104  CO2 24  GLUCOSE 206*  BUN 7*  CREATININE 0.87  CALCIUM 9.2   Estimated Creatinine Clearance: 61.8 mL/min (by C-G formula based on SCr of 0.87 mg/dL). Liver & Pancreas: Recent Labs  Lab 05/12/23 1334  AST 28  ALT 23  ALKPHOS 67  BILITOT 0.6  PROT 6.8  ALBUMIN 3.7   No results for input(s): "LIPASE", "AMYLASE" in the last 168 hours. No results for input(s): "AMMONIA" in the last 168 hours. Diabetic: Recent Labs    05/12/23 2150 05/13/23 0534  HGBA1C 8.5* 8.4*   Recent Labs  Lab 05/12/23 2151 05/13/23 0508 05/13/23 0739 05/13/23 1140  GLUCAP 135* 165* 144* 213*   Cardiac Enzymes: No results for input(s): "CKTOTAL", "CKMB", "CKMBINDEX", "TROPONINI" in the last 168 hours. No results for input(s): "PROBNP" in the last 8760 hours. Coagulation Profile: No results for input(s): "INR", "PROTIME" in the last 168  hours. Thyroid Function Tests: No results for input(s): "TSH", "T4TOTAL", "FREET4", "T3FREE", "THYROIDAB" in the last 72 hours. Lipid Profile: No results for input(s): "CHOL", "HDL", "LDLCALC", "TRIG", "CHOLHDL", "LDLDIRECT" in the last 72 hours. Anemia Panel: No results for input(s): "VITAMINB12", "FOLATE", "FERRITIN", "TIBC", "IRON", "RETICCTPCT" in the last 72 hours. Urine analysis:    Component Value Date/Time   COLORURINE YELLOW 08/17/2021 1245   APPEARANCEUR Sl Cloudy (A) 08/17/2021 1245   LABSPEC 1.020 08/17/2021 1245   LABSPEC 1.030 08/12/2006 1420   PHURINE 6.0 08/17/2021 1245   GLUCOSEU >=1000 (A) 08/17/2021 1245   HGBUR TRACE-LYSED (A) 08/17/2021 1245   BILIRUBINUR NEGATIVE 08/17/2021 1245   BILIRUBINUR negative 05/25/2021 1604   BILIRUBINUR Negative 08/12/2006 1420   KETONESUR NEGATIVE 08/17/2021 1245   PROTEINUR Negative 05/25/2021 1604   PROTEINUR NEGATIVE 05/23/2021 1613   UROBILINOGEN 0.2 08/17/2021 1245  NITRITE NEGATIVE 08/17/2021 1245   LEUKOCYTESUR NEGATIVE 08/17/2021 1245   LEUKOCYTESUR Trace 08/12/2006 1420   Sepsis Labs: Invalid input(s): "PROCALCITONIN", "LACTICIDVEN"  Microbiology: Recent Results (from the past 240 hours)  Respiratory (~20 pathogens) panel by PCR     Status: None   Collection Time: 05/13/23  5:19 AM   Specimen: Nasopharyngeal Swab; Respiratory  Result Value Ref Range Status   Adenovirus NOT DETECTED NOT DETECTED Final   Coronavirus 229E NOT DETECTED NOT DETECTED Final    Comment: (NOTE) The Coronavirus on the Respiratory Panel, DOES NOT test for the novel  Coronavirus (2019 nCoV)    Coronavirus HKU1 NOT DETECTED NOT DETECTED Final   Coronavirus NL63 NOT DETECTED NOT DETECTED Final   Coronavirus OC43 NOT DETECTED NOT DETECTED Final   Metapneumovirus NOT DETECTED NOT DETECTED Final   Rhinovirus / Enterovirus NOT DETECTED NOT DETECTED Final   Influenza A NOT DETECTED NOT DETECTED Final   Influenza B NOT DETECTED NOT DETECTED  Final   Parainfluenza Virus 1 NOT DETECTED NOT DETECTED Final   Parainfluenza Virus 2 NOT DETECTED NOT DETECTED Final   Parainfluenza Virus 3 NOT DETECTED NOT DETECTED Final   Parainfluenza Virus 4 NOT DETECTED NOT DETECTED Final   Respiratory Syncytial Virus NOT DETECTED NOT DETECTED Final   Bordetella pertussis NOT DETECTED NOT DETECTED Final   Bordetella Parapertussis NOT DETECTED NOT DETECTED Final   Chlamydophila pneumoniae NOT DETECTED NOT DETECTED Final   Mycoplasma pneumoniae NOT DETECTED NOT DETECTED Final    Comment: Performed at [facility], [address] [place], [place] [address]    Radiology Studies: CT Angio Abd/Pel W and/or Wo Contrast Result Date: 05/12/2023 CLINICAL DATA:  Rectal bleeding since this morning, history of colon cancer * Tracking Code: BO * EXAM: CTA ABDOMEN AND PELVIS WITHOUT AND WITH CONTRAST TECHNIQUE: Multidetector CT imaging of the abdomen and pelvis was performed using the standard protocol during bolus administration of intravenous contrast. Multiplanar reconstructed images and MIPs were obtained and reviewed to evaluate the vascular anatomy. RADIATION DOSE REDUCTION: This exam was performed according to the departmental dose-optimization program which includes automated exposure control, adjustment of the mA and/or kV according to patient size and/or use of iterative reconstruction technique. CONTRAST:  OMNIPAQUE IOHEXOL 350 MG/ML SOLN COMPARISON:  09/08/2020 FINDINGS: VASCULAR Normal contour and caliber of the abdominal aorta. No evidence of aneurysm, dissection, or other acute aortic pathology. Standard branching pattern of the abdominal aorta with solitary bilateral renal arteries. Review of the MIP images confirms the above findings. NON-VASCULAR Lower Chest: No acute findings. Hepatobiliary: No solid liver abnormality is seen. Hepatic steatosis. No gallstones, gallbladder wall thickening, or biliary dilatation. Pancreas: Unremarkable. No  pancreatic ductal dilatation or surrounding inflammatory changes. Spleen: Normal in size without significant abnormality. Adrenals/Urinary Tract: Adrenal glands are unremarkable. Kidneys are normal, without renal calculi, solid lesion, or hydronephrosis. Unchanged wall thickening of the urinary bladder, possibly due to local radiation therapy (series 12, image 178). Stomach/Bowel: Stomach is within normal limits. Appendix appears normal. Long segment wall thickening mucosal hyperenhancement of the transverse and descending colon (series 12, image 100). Unchanged postoperative and post treatment appearance of the low pelvis status post low anterior resection with extensive presacral soft tissue thickening (series 12, image 170). No intraluminal contrast extravasation or other findings to specifically localize nidus of GI bleeding. Lymphatic: No enlarged abdominal or pelvic lymph nodes. Reproductive: No mass or other significant abnormality. Other: Broad-based midline ventral hernia.  No ascites. Musculoskeletal: No acute osseous findings.  IMPRESSION: 1. Long segment wall thickening and mucosal hyperenhancement of the transverse and descending colon, consistent with nonspecific infectious or inflammatory colitis. 2. Unchanged postoperative and post treatment appearance of the low pelvis status post low anterior resection with extensive presacral soft tissue thickening. 3. No intraluminal contrast extravasation or other findings to specifically localize nidus of GI bleeding. 4. Hepatic steatosis. Electronically Signed   By: Jearld Lesch M.D.   On: 05/12/2023 17:00      Annajulia Lewing T. Larose Batres [facility]  If 7PM-7AM, please contact night-coverage [email or web address] 05/13/2023, 12:13 PM

## 2023-05-14 DIAGNOSIS — K625 Hemorrhage of anus and rectum: Secondary | ICD-10-CM | POA: Diagnosis not present

## 2023-05-14 LAB — RENAL FUNCTION PANEL
Albumin: 3 g/dL — ABNORMAL LOW (ref 3.5–5.0)
Anion gap: 7 (ref 5–15)
BUN: 7 mg/dL — ABNORMAL LOW (ref 8–23)
CO2: 23 mmol/L (ref 22–32)
Calcium: 8.2 mg/dL — ABNORMAL LOW (ref 8.9–10.3)
Chloride: 111 mmol/L (ref 98–111)
Creatinine, Ser: 0.8 mg/dL (ref 0.44–1.00)
GFR, Estimated: >60 mL/min (ref >60)
Glucose, Bld: 149 mg/dL — ABNORMAL HIGH (ref 70–99)
Phosphorus: 3.7 mg/dL (ref 2.5–4.6)
Potassium: 4 mmol/L (ref 3.5–5.1)
Sodium: 141 mmol/L (ref 135–145)

## 2023-05-14 LAB — MAGNESIUM: Magnesium: 1.8 mg/dL (ref 1.7–2.4)

## 2023-05-14 LAB — CBC
HCT: 37.3 % (ref 36.0–46.0)
Hemoglobin: 11.4 g/dL — ABNORMAL LOW (ref 12.0–15.0)
MCH: 22.1 pg — ABNORMAL LOW (ref 26.0–34.0)
MCHC: 30.6 g/dL (ref 30.0–36.0)
MCV: 72.3 fL — ABNORMAL LOW (ref 80.0–100.0)
Platelets: 211 10*3/uL (ref 150–400)
RBC: 5.16 MIL/uL — ABNORMAL HIGH (ref 3.87–5.11)
RDW: 14.6 % (ref 11.5–15.5)
WBC: 4.7 10*3/uL (ref 4.0–10.5)
nRBC: 0 % (ref 0.0–0.2)

## 2023-05-14 LAB — GLUCOSE, CAPILLARY: Glucose-Capillary: 136 mg/dL — ABNORMAL HIGH (ref 70–99)

## 2023-05-14 MED ORDER — DICYCLOMINE HCL 10 MG PO CAPS: 10.0000 mg | ORAL_CAPSULE | Freq: Four times a day (QID) | ORAL | 0 refills | Status: DC | PRN

## 2023-05-14 MED ORDER — POLYETHYLENE GLYCOL 3350 17 GM/SCOOP PO POWD
17.0000 g | Freq: Two times a day (BID) | ORAL | 2 refills | Status: AC | PRN
Start: 2023-05-14 — End: ?

## 2023-05-14 MED ORDER — PANTOPRAZOLE SODIUM 40 MG PO TBEC: 40.0000 mg | DELAYED_RELEASE_TABLET | Freq: Every day | ORAL | 0 refills | Status: AC

## 2023-05-14 MED ORDER — GLIPIZIDE ER 5 MG PO TB24: 5.0000 mg | ORAL_TABLET | Freq: Every day | ORAL | 0 refills | Status: DC

## 2023-05-14 MED ORDER — AMOXICILLIN-POT CLAVULANATE 875-125 MG PO TABS: 1.0000 | ORAL_TABLET | Freq: Two times a day (BID) | ORAL | 0 refills | Status: AC

## 2023-05-14 NOTE — Discharge Summary (Signed)
 Physician Discharge Summary  [name] [name] BJY:[id number] DOB: [birth year] DOA: 05/12/2023  PCP: Avanell Shackleton, NP-C  Admit date: 05/12/2023 Discharge date: 05/14/23  Admitted From: Home Disposition: Home Recommendations for Outpatient Follow-up:  Follow up with PCP in 1 to 2 weeks GI to arrange outpatient follow-up Check CMP and CBC at follow-up Please follow up on the following pending results: None  Home Health: No need identified Equipment/Devices: No need identified  Discharge Condition: Stable CODE STATUS: Full code  Follow-up Information     Henson, Vickie L, NP-C. Schedule an appointment as soon as possible for a visit in 1 week(s).   Specialty: Family Medicine Contact information: [address] [place] [address] [phone]                 Hospital course 63 year old F with PMH of colon cancer s/p resection, chemoradiation about 10 years ago, DM-2, obesity, anemia and headache presented to ED with rectal bleed.   Patient had severe cramping abdominal pain on Saturday followed by urge for bowel movement with passage of initially solid stool, then loose stool and then noted blood and clots. She had associated diaphoresis nausea and vomiting with this episode.  Has been passing blood clots since then.  Not on anticoagulation, antiplatelet or NSAID.   In ED, slightly hypertensive.  Hgb 12.9.  Hemoccult positive.  CT abdomen and pelvis raise concern for nonspecific colitis and hepatic steatosis.  Started on ceftriaxone and Flagyl.  GI consulted and patient was admitted for further evaluation.  The next day, GI bleed subsided.  Hemoglobin remained stable.  Evaluated by GI.  Started on soft diet and Bentyl.  Continued on ceftriaxone and Flagyl.  On the day of discharge, GI bleed resolved.  She had regular bowel movement without blood.  No pain, nausea or vomiting.  She is discharged on p.o. Augmentin for 4 more days, p.o. Protonix, Bentyl and MiraLAX. GI  to arrange outpatient follow-up.  See individual problem list below for more.   Problems addressed during this hospitalization Lower GI bleed/rectal bleed-imaging raises concern for nonspecific colitis.  Patient with history of colon cancer s/p resection, chemoradiation about 10 years ago.  Last colonoscopy in 2022 and will be due in 2027.  She had suprapubic, epigastric, RLQ and RUQ tenderness without rebound tenderness or guarding.  GI bleed, pain and tenderness resolved.  Tolerated soft diet.  H&H remained stable.  Received ceftriaxone and Flagyl for 2 days. -Discharged on p.o. Augmentin for 4 more days and p.o. Protonix 40 mg daily for 30 days -MiraLAX and Bentyl as needed -GI to arrange outpatient follow-up.   NIDDM-2 with hyperglycemia: A1c 8.4% (was 13% in 2023).  On Trulicity at home.  History of metformin intolerance. Recent Labs  Lab 05/13/23 0739 05/13/23 1140 05/13/23 1612 05/13/23 1944 05/14/23 0536  GLUCAP 144* 213* 126* 189* 136*  -Continue home Trulicity -Added low-dose glipizide -Reassess glycemic control at follow-up   Possible colitis: -Management as above.   Hyperlipidemia -Continue home statin   History of colon cancer s/p resection, chemoradiation over 10 years ago.  No evidence of recurrence   Headache: Seems to have resolved -Tylenol as needed   Class II obesity:  Body mass index is 35.15 kg/m. -On Trulicity. -Encourage lifestyle change to lose weight            Time spent 35 minutes  Vital signs Vitals:   05/13/23 1941 05/13/23 2352 05/14/23 0649 05/14/23 0727  BP: (!) 140/74 (!) 123/59 136/70 Marland Kitchen)  159/79  Pulse: 86 100 76 88  Temp: 98.7 F (37.1 C) 98.2 F (36.8 C) 98.2 F (36.8 C) 98.2 F (36.8 C)  Resp: 15 16 18    Height:      Weight:      SpO2: 99% 100% 97% 98%  TempSrc: Oral Oral  Oral  BMI (Calculated):         Discharge exam  GENERAL: No apparent distress.  Nontoxic. HEENT: MMM.  Vision and hearing grossly intact.   NECK: Supple.  No apparent JVD.  RESP:  No IWOB.  Fair aeration bilaterally. CVS:  RRR. Heart sounds normal.  ABD/GI/GU: BS+. Abd soft, NTND.  MSK/EXT:  Moves extremities. No apparent deformity. No edema.  SKIN: no apparent skin lesion or wound NEURO: Awake and alert. Oriented appropriately.  No apparent focal neuro deficit. PSYCH: Calm. Normal affect.   Discharge Instructions Discharge Instructions     Diet Carb Modified   Complete by: As directed    Discharge instructions   Complete by: As directed    It has been a pleasure taking care of you!  You were hospitalized due to rectal bleed that seems to have subsided.  We have started you on antibiotics for possible infection.  We are discharging you on antibiotics to complete treatment course. We have also prescribed you Bentyl for abdominal cramp, Miralax as needed for constipation. Your A1c is 8.4%. Continue your Trulicity. We have added low dose glipizide for better diabetic control. Please review your new medication list and the directions on your medications before you take them. Folow up with your PCP in one to two weeks.   Take care,   Increase activity slowly   Complete by: As directed       Allergies as of 05/14/2023       Reactions   Epinephrine Anaphylaxis   Lidocaine Anaphylaxis   Lisinopril Other (See Comments)   itching   Metformin And Related Nausea Only   GI upset        Medication List     TAKE these medications    amoxicillin-clavulanate 875-125 MG tablet Commonly known as: AUGMENTIN Take 1 tablet by mouth 2 (two) times daily for 4 days.   atorvastatin 20 MG tablet Commonly known as: LIPITOR Take 1 tablet (20 mg total) by mouth daily. Must see provider for further refills.   D3 VITAMIN PO Take 1 capsule by mouth daily.   dicyclomine 10 MG capsule Commonly known as: BENTYL Take 1 capsule (10 mg total) by mouth every 6 (six) hours as needed for up to 5 days for spasms (cramping).   FreeStyle  Libre 3 Sensor Misc Apply 1 sensor to the skin every 14 days for continuous glucose monitoring.   glipiZIDE 5 MG 24 hr tablet Commonly known as: glipiZIDE XL Take 1 tablet (5 mg total) by mouth daily with breakfast.   multivitamin with minerals Tabs tablet Take 1 tablet by mouth daily.   pantoprazole 40 MG tablet Commonly known as: Protonix Take 1 tablet (40 mg total) by mouth daily.   polyethylene glycol powder 17 GM/SCOOP powder Commonly known as: MiraLax Take 17 g by mouth 2 (two) times daily as needed for moderate constipation or mild constipation.   Trulicity 3 MG/0.5ML Soaj Generic drug: Dulaglutide Inject 3 mg into the skin once a week.        Consultations: Gastroenterology  Procedures/Studies:   CT Angio Abd/Pel W and/or Wo Contrast Result Date: 05/12/2023 CLINICAL DATA:  Rectal bleeding since this  morning, history of colon cancer * Tracking Code: BO * EXAM: CTA ABDOMEN AND PELVIS WITHOUT AND WITH CONTRAST TECHNIQUE: Multidetector CT imaging of the abdomen and pelvis was performed using the standard protocol during bolus administration of intravenous contrast. Multiplanar reconstructed images and MIPs were obtained and reviewed to evaluate the vascular anatomy. RADIATION DOSE REDUCTION: This exam was performed according to the departmental dose-optimization program which includes automated exposure control, adjustment of the mA and/or kV according to patient size and/or use of iterative reconstruction technique. CONTRAST:  OMNIPAQUE IOHEXOL 350 MG/ML SOLN COMPARISON:  09/08/2020 FINDINGS: VASCULAR Normal contour and caliber of the abdominal aorta. No evidence of aneurysm, dissection, or other acute aortic pathology. Standard branching pattern of the abdominal aorta with solitary bilateral renal arteries. Review of the MIP images confirms the above findings. NON-VASCULAR Lower Chest: No acute findings. Hepatobiliary: No solid liver abnormality is seen. Hepatic steatosis.  No gallstones, gallbladder wall thickening, or biliary dilatation. Pancreas: Unremarkable. No pancreatic ductal dilatation or surrounding inflammatory changes. Spleen: Normal in size without significant abnormality. Adrenals/Urinary Tract: Adrenal glands are unremarkable. Kidneys are normal, without renal calculi, solid lesion, or hydronephrosis. Unchanged wall thickening of the urinary bladder, possibly due to local radiation therapy (series 12, image 178). Stomach/Bowel: Stomach is within normal limits. Appendix appears normal. Long segment wall thickening mucosal hyperenhancement of the transverse and descending colon (series 12, image 100). Unchanged postoperative and post treatment appearance of the low pelvis status post low anterior resection with extensive presacral soft tissue thickening (series 12, image 170). No intraluminal contrast extravasation or other findings to specifically localize nidus of GI bleeding. Lymphatic: No enlarged abdominal or pelvic lymph nodes. Reproductive: No mass or other significant abnormality. Other: Broad-based midline ventral hernia.  No ascites. Musculoskeletal: No acute osseous findings. IMPRESSION: 1. Long segment wall thickening and mucosal hyperenhancement of the transverse and descending colon, consistent with nonspecific infectious or inflammatory colitis. 2. Unchanged postoperative and post treatment appearance of the low pelvis status post low anterior resection with extensive presacral soft tissue thickening. 3. No intraluminal contrast extravasation or other findings to specifically localize nidus of GI bleeding. 4. Hepatic steatosis. Electronically Signed   By: Jearld Lesch M.D.   On: 05/12/2023 17:00       The results of significant diagnostics from this hospitalization (including imaging, microbiology, ancillary and laboratory) are listed below for reference.     Microbiology: Recent Results (from the past 240 hours)  Respiratory (~20 pathogens)  panel by PCR     Status: None   Collection Time: 05/13/23  5:19 AM   Specimen: Nasopharyngeal Swab; Respiratory  Result Value Ref Range Status   Adenovirus NOT DETECTED NOT DETECTED Final   Coronavirus 229E NOT DETECTED NOT DETECTED Final    Comment: (NOTE) The Coronavirus on the Respiratory Panel, DOES NOT test for the novel  Coronavirus (2019 nCoV)    Coronavirus HKU1 NOT DETECTED NOT DETECTED Final   Coronavirus NL63 NOT DETECTED NOT DETECTED Final   Coronavirus OC43 NOT DETECTED NOT DETECTED Final   Metapneumovirus NOT DETECTED NOT DETECTED Final   Rhinovirus / Enterovirus NOT DETECTED NOT DETECTED Final   Influenza A NOT DETECTED NOT DETECTED Final   Influenza B NOT DETECTED NOT DETECTED Final   Parainfluenza Virus 1 NOT DETECTED NOT DETECTED Final   Parainfluenza Virus 2 NOT DETECTED NOT DETECTED Final   Parainfluenza Virus 3 NOT DETECTED NOT DETECTED Final   Parainfluenza Virus 4 NOT DETECTED NOT DETECTED Final   Respiratory Syncytial Virus  NOT DETECTED NOT DETECTED Final   Bordetella pertussis NOT DETECTED NOT DETECTED Final   Bordetella Parapertussis NOT DETECTED NOT DETECTED Final   Chlamydophila pneumoniae NOT DETECTED NOT DETECTED Final   Mycoplasma pneumoniae NOT DETECTED NOT DETECTED Final    Comment: Performed at [facility], [address] [place], [place] [address]     Labs:  CBC: Recent Labs  Lab 05/12/23 1334 05/12/23 2150 05/13/23 0534 05/13/23 1601 05/14/23 0633  WBC 9.6  --   --  6.3 4.7  HGB 12.9 13.0 11.8* 12.0 11.4*  HCT 42.6 42.7 38.7 38.7 37.3  MCV 72.7*  --   --  71.9* 72.3*  PLT 252  --   --  216 211   BMP &GFR Recent Labs  Lab 05/12/23 1334 05/13/23 1601 05/14/23 0633  NA 139 142 141  K 4.1 3.4* 4.0  CL 104 108 111  CO2 24 24 23   GLUCOSE 206* 119* 149*  BUN 7* 7* 7*  CREATININE 0.87 0.86 0.80  CALCIUM 9.2 8.3* 8.2*  MG  --  1.7 1.8  PHOS  --   --  3.7   Estimated Creatinine Clearance: 67.2 mL/min (by C-G formula based  on SCr of 0.8 mg/dL). Liver & Pancreas: Recent Labs  Lab 05/12/23 1334 05/13/23 1601 05/14/23 0633  AST 28 20  --   ALT 23 19  --   ALKPHOS 67 57  --   BILITOT 0.6 0.6  --   PROT 6.8 6.2*  --   ALBUMIN 3.7 3.3* 3.0*   No results for input(s): "LIPASE", "AMYLASE" in the last 168 hours. No results for input(s): "AMMONIA" in the last 168 hours. Diabetic: Recent Labs    05/12/23 2150 05/13/23 0534  HGBA1C 8.5* 8.4*   Recent Labs  Lab 05/13/23 0739 05/13/23 1140 05/13/23 1612 05/13/23 1944 05/14/23 0536  GLUCAP 144* 213* 126* 189* 136*   Cardiac Enzymes: No results for input(s): "CKTOTAL", "CKMB", "CKMBINDEX", "TROPONINI" in the last 168 hours. No results for input(s): "PROBNP" in the last 8760 hours. Coagulation Profile: No results for input(s): "INR", "PROTIME" in the last 168 hours. Thyroid Function Tests: No results for input(s): "TSH", "T4TOTAL", "FREET4", "T3FREE", "THYROIDAB" in the last 72 hours. Lipid Profile: No results for input(s): "CHOL", "HDL", "LDLCALC", "TRIG", "CHOLHDL", "LDLDIRECT" in the last 72 hours. Anemia Panel: No results for input(s): "VITAMINB12", "FOLATE", "FERRITIN", "TIBC", "IRON", "RETICCTPCT" in the last 72 hours. Urine analysis:    Component Value Date/Time   COLORURINE YELLOW 08/17/2021 1245   APPEARANCEUR Sl Cloudy (A) 08/17/2021 1245   LABSPEC 1.020 08/17/2021 1245   LABSPEC 1.030 08/12/2006 1420   PHURINE 6.0 08/17/2021 1245   GLUCOSEU >=1000 (A) 08/17/2021 1245   HGBUR TRACE-LYSED (A) 08/17/2021 1245   BILIRUBINUR NEGATIVE 08/17/2021 1245   BILIRUBINUR negative 05/25/2021 1604   BILIRUBINUR Negative 08/12/2006 1420   KETONESUR NEGATIVE 08/17/2021 1245   PROTEINUR Negative 05/25/2021 1604   PROTEINUR NEGATIVE 05/23/2021 1613   UROBILINOGEN 0.2 08/17/2021 1245   NITRITE NEGATIVE 08/17/2021 1245   LEUKOCYTESUR NEGATIVE 08/17/2021 1245   LEUKOCYTESUR Trace 08/12/2006 1420   Sepsis Labs: Invalid input(s): "PROCALCITONIN",  "LACTICIDVEN"   SIGNED:  Almon Hercules, MD  [facility] 05/14/2023, 2:04 PM

## 2023-05-15 ENCOUNTER — Ambulatory Visit
Admission: RE | Admit: 2023-05-15 | Discharge: 2023-05-15 | Disposition: A | Discharge status: Home / Self Care | Payer: BC Managed Care – PPO | Source: Ambulatory Visit | Attending: Family Medicine | Admitting: Family Medicine

## 2023-05-15 DIAGNOSIS — Z1231 Encounter for screening mammogram for malignant neoplasm of breast: Secondary | ICD-10-CM

## 2023-05-17 ENCOUNTER — Other Ambulatory Visit: Payer: Self-pay | Admitting: Family Medicine

## 2023-05-17 ENCOUNTER — Ambulatory Visit: Payer: Self-pay | Admitting: Family Medicine

## 2023-05-17 MED ORDER — FLUCONAZOLE 150 MG PO TABS: 150.0000 mg | ORAL_TABLET | Freq: Once | ORAL | 0 refills | Status: DC

## 2023-05-17 NOTE — Telephone Encounter (Signed)
 Called and notified pt

## 2023-05-17 NOTE — Telephone Encounter (Signed)
>>   May 17, 2023  2:06 PM Martinique E wrote: Patient called back to see if this medication was sent in. Patient stated the need for it as she was put on antibiotics at the hospital (Agent offered hospital follow-up appointment, patient denied) and this medication will help patient not get a yeast infection.

## 2023-05-17 NOTE — Telephone Encounter (Signed)
 Pt got yeast infection from recent abx

## 2023-05-17 NOTE — Telephone Encounter (Signed)
 Pt requests fluconazole sent in

## 2023-05-17 NOTE — Telephone Encounter (Addendum)
 This RN made 3rd attempt to reach pt, "call cannot be completed as dialed" message. Routing to clinic for follow up.  This RN made 2nd attempt to reach pt, "call cannot be completed as dialed" message.  This RN made 1st attempt to reach pt, "call cannot be completed as dialed" message.  Copied from CRM [id number]. Topic: Clinical - Medication Question >> May 17, 2023 10:28 AM Alcus Dad wrote: Reason for CRM: Patient is wanting doctor to send in fluconazole

## 2023-05-30 ENCOUNTER — Other Ambulatory Visit (HOSPITAL_COMMUNITY): Payer: Self-pay

## 2023-05-30 ENCOUNTER — Telehealth: Payer: Self-pay | Admitting: Family Medicine

## 2023-05-30 ENCOUNTER — Telehealth: Payer: Self-pay

## 2023-05-30 NOTE — Telephone Encounter (Signed)
 Needs prior auth initiated

## 2023-05-30 NOTE — Telephone Encounter (Signed)
 Pharmacy Patient Advocate Encounter   Received notification from Pt Calls Messages that prior authorization for Trulicity is required/requested.   Insurance verification completed.   The patient is insured through [facility] MEDICAID .   Per test claim: PA required; PA submitted to above mentioned insurance via CoverMyMeds Key/confirmation #/EOC [id number] Status is pending

## 2023-05-30 NOTE — Telephone Encounter (Signed)
 Copied from CRM [id number]. Topic: Clinical - Prescription Issue >> May 30, 2023  9:49 AM Denese Killings wrote: Reason for CRM: Patient insurance won't cover Dulaglutide (TRULICITY) 3 MG/0.5ML SOAJ  and she is needing prior Authorization (due to it being so high) for medication so they will cover it.

## 2023-06-03 ENCOUNTER — Other Ambulatory Visit: Payer: Self-pay

## 2023-06-03 MED ORDER — TRULICITY 3 MG/0.5ML ~~LOC~~ SOAJ: 3.0000 mg | SUBCUTANEOUS | 0 refills | Status: DC

## 2023-06-03 NOTE — Telephone Encounter (Signed)
 Called and notified pt

## 2023-06-03 NOTE — Telephone Encounter (Signed)
 Pharmacy Patient Advocate Encounter  Received notification from [facility] MEDICAID that Prior Authorization for Trulicity 3mg /0.62ml has been APPROVED from 05/30/23 to 05/29/24   PA #/Case ID/Reference #: [id number]

## 2023-06-03 NOTE — Telephone Encounter (Signed)
 Called and notified pt of APPROVAL

## 2023-06-18 ENCOUNTER — Other Ambulatory Visit: Payer: Self-pay

## 2023-06-18 MED FILL — Atorvastatin Calcium Tab 20 MG (Base Equivalent): ORAL | 30 days supply | Qty: 30 | Fill #0 | Status: AC

## 2023-06-21 ENCOUNTER — Other Ambulatory Visit: Payer: Self-pay

## 2023-06-21 ENCOUNTER — Other Ambulatory Visit (HOSPITAL_COMMUNITY): Payer: Self-pay

## 2023-06-26 ENCOUNTER — Ambulatory Visit: Admitting: Physician Assistant

## 2023-06-27 ENCOUNTER — Telehealth: Payer: Self-pay | Admitting: Family Medicine

## 2023-06-27 NOTE — Telephone Encounter (Signed)
 Copied from CRM [id number]. Topic: General - Other >> Jun 27, 2023 10:04 AM [name] [name] wrote: Wants to know if she can bring in her handicap form because her tag expires in June

## 2023-06-27 NOTE — Telephone Encounter (Signed)
 Called pt and advised she will need appt as this form has not been completed by pcp previously, will need to document reason for handicap form. Pt booked mychart visit for next week

## 2023-07-03 ENCOUNTER — Encounter: Payer: Self-pay | Admitting: Family Medicine

## 2023-07-03 ENCOUNTER — Telehealth (INDEPENDENT_AMBULATORY_CARE_PROVIDER_SITE_OTHER): Admitting: Family Medicine

## 2023-07-03 DIAGNOSIS — M549 Dorsalgia, unspecified: Secondary | ICD-10-CM

## 2023-07-03 DIAGNOSIS — E118 Type 2 diabetes mellitus with unspecified complications: Secondary | ICD-10-CM | POA: Diagnosis not present

## 2023-07-03 DIAGNOSIS — R262 Difficulty in walking, not elsewhere classified: Secondary | ICD-10-CM

## 2023-07-03 DIAGNOSIS — M25559 Pain in unspecified hip: Secondary | ICD-10-CM | POA: Diagnosis not present

## 2023-07-03 DIAGNOSIS — G8929 Other chronic pain: Secondary | ICD-10-CM

## 2023-07-03 NOTE — Progress Notes (Signed)
 MyChart Video Visit    Virtual Visit via Video Note    Patient location: Home. Patient and provider in visit Provider location: Office 2 patient identifiers used  I discussed the limitations of evaluation and management by telemedicine and the availability of in person appointments. The patient expressed understanding and agreed to proceed.  Visit Date: 07/03/2023  Today's healthcare provider: Alyson Back, NP-C     Subjective:    Patient ID: [name] [name], female    DOB: 1960-07-23, 63 y.o.   MRN: [id number]  Chief Complaint  Patient presents with   handicap form    HPI  She is requesting a handicap placard.  States she has had 1 for approximately 20 years.  She gets 5-year renewals.  Previous PCP did this for her in the past.   States she has knee pain and hip pain. Sees orthopedist as needed.  States she has trouble walking long distances     Past Medical History:  Diagnosis Date   Abdominal hernia    4 hernias per pt   Abdominal pain    mid abd pain radiating to right side   Anemia    Cancer (HCC)    rectal ca   Change in bowel movement    Colon cancer (HCC)    Complication of anesthesia    cardiac arrest during nasal surgery   Diabetes mellitus without complication (HCC)    Headache    History of rectal bleeding     Past Surgical History:  Procedure Laterality Date   CESAREAN SECTION     colon cancer     COLON SURGERY     COLONOSCOPY     LAPAROSCOPIC LYSIS OF ADHESIONS  04/02/2014   Procedure: LAPAROSCOPIC LYSIS OF ADHESIONS;  Surgeon: Adalberto Hollow, MD;  Location: [facility];  Service: General;;   NOSE SURGERY     POLYPECTOMY     VENTRAL HERNIA REPAIR N/A 04/02/2014   Procedure: LAPAROSCOPIC  LYSIS OF ADHESIONS AND REPAIR OF VENTRAL INCISIONAL HERNIAS WITH MESH;  Surgeon: Adalberto Hollow, MD;  Location: [facility];  Service: General;  Laterality: N/A;    Family History  Problem Relation Age of Onset   Uterine cancer Mother    Colon  cancer Maternal Aunt    Breast cancer Paternal Aunt    Colon cancer Maternal Grandmother    Breast cancer Cousin    Breast cancer Cousin    Diabetes Neg Hx    Rectal cancer Neg Hx    Stomach cancer Neg Hx     Social History   Socioeconomic History   Marital status: Married    Spouse name: Not on file   Number of children: 3   Years of education: 12   Highest education level: Not on file  Occupational History   Occupation: Customer Service  Tobacco Use   Smoking status: Former    Current packs/day: 0.00    Average packs/day: 0.3 packs/day for 13.0 years (3.3 ttl pk-yrs)    Types: Cigarettes    Start date: 03/13/1990    Quit date: 03/14/2003    Years since quitting: 20.3   Smokeless tobacco: Never  Vaping Use   Vaping status: Some Days   Substances: Flavoring   Devices: flavored , non-nicotine  Substance and Sexual Activity   Alcohol use: Yes    Alcohol/week: 0.0 standard drinks of alcohol    Comment: occasional    Drug use: No   Sexual activity: Not Currently  Other Topics Concern  Not on file  Social History Narrative   Fun: Camping, Cruise    Denies abuse and feels safe at home.    Social Drivers of Corporate investment banker Strain: Not on file  Food Insecurity: No Food Insecurity (05/13/2023)   Hunger Vital Sign    Worried About Running Out of Food in the Last Year: Never true    Ran Out of Food in the Last Year: Never true  Transportation Needs: No Transportation Needs (05/13/2023)   PRAPARE - Administrator, Civil Service (Medical): No    Lack of Transportation (Non-Medical): No  Physical Activity: Not on file  Stress: Not on file  Social Connections: Not on file  Intimate Partner Violence: Not At Risk (05/13/2023)   Humiliation, Afraid, Rape, and Kick questionnaire    Fear of Current or Ex-Partner: No    Emotionally Abused: No    Physically Abused: No    Sexually Abused: No    Outpatient Medications Prior to Visit  Medication Sig  Dispense Refill   atorvastatin  (LIPITOR) 20 MG tablet Take 1 tablet (20 mg total) by mouth daily. Must see provider for further refills. 30 tablet 0   Cholecalciferol (D3 VITAMIN PO) Take 1 capsule by mouth daily.     Continuous Glucose Sensor (FREESTYLE LIBRE 3 SENSOR) MISC Apply 1 sensor to the skin every 14 days for continuous glucose monitoring. (Patient not taking: Reported on 05/13/2023) 2 each 11   dicyclomine  (BENTYL ) 10 MG capsule Take 1 capsule (10 mg total) by mouth every 6 (six) hours as needed for up to 5 days for spasms (cramping). 20 capsule 0   Dulaglutide  (TRULICITY ) 3 MG/0.5ML SOAJ Inject 3 mg into the skin once a week. 6 mL 0   glipiZIDE  (GLIPIZIDE  XL) 5 MG 24 hr tablet Take 1 tablet (5 mg total) by mouth daily with breakfast. 90 tablet 0   Multiple Vitamin (MULTIVITAMIN WITH MINERALS) TABS tablet Take 1 tablet by mouth daily.     pantoprazole  (PROTONIX ) 40 MG tablet Take 1 tablet (40 mg total) by mouth daily. 30 tablet 0   polyethylene glycol powder (MIRALAX ) 17 GM/SCOOP powder Take 17 g by mouth 2 (two) times daily as needed for moderate constipation or mild constipation. 255 g 2   No facility-administered medications prior to visit.    Allergies  Allergen Reactions   Epinephrine  Anaphylaxis   Lidocaine  Anaphylaxis   Lisinopril  Other (See Comments)    itching   Metformin  And Related Nausea Only    GI upset    Review of Systems  Constitutional:  Negative for chills and fever.  Respiratory:  Negative for shortness of breath.   Cardiovascular:  Negative for chest pain, palpitations and leg swelling.  Gastrointestinal:  Negative for nausea and vomiting.  Musculoskeletal:  Positive for back pain and joint pain.  Neurological:  Negative for dizziness and focal weakness.       Objective:    Physical Exam  There were no vitals taken for this visit. Wt Readings from Last 3 Encounters:  05/13/23 180 lb (81.6 kg)  04/11/23 182 lb (82.6 kg)  12/07/21 189 lb (85.7 kg)    Alert and oriented and in no acute distress.  Respirations unlabored.  Normal speech, mood and thought process.    Assessment & Plan:   Problem List Items Addressed This Visit     Type 2 diabetes with complication (HCC)   Other Visit Diagnoses       Chronic hip pain,  unspecified laterality    -  Primary     Chronic back pain, unspecified back location, unspecified back pain laterality         Difficulty walking          She reports needing a handicap placard for chronic pain in her back, hips and knees.  She has limitations with walking long distances.  She has had 5-year and a Placard in the past.  This is my first time filling out her handicap placard. Recommend follow up with orthopedist for worsening pain.   I am having [name] [name] maintain her multivitamin with minerals, atorvastatin , FreeStyle Libre 3 Sensor, Cholecalciferol (D3 VITAMIN PO), dicyclomine , pantoprazole , glipiZIDE , polyethylene glycol powder, and Trulicity .  No orders of the defined types were placed in this encounter.   I discussed the assessment and treatment plan with the patient. The patient was provided an opportunity to ask questions and all were answered. The patient agreed with the plan and demonstrated an understanding of the instructions.   The patient was advised to call back or seek an in-person evaluation if the symptoms worsen or if the condition fails to improve as anticipated.     Alyson Back, NP-C [facility] [place] [phone] (phone) [phone] (fax)  [facility]

## 2023-08-05 ENCOUNTER — Other Ambulatory Visit: Payer: Self-pay

## 2023-08-05 ENCOUNTER — Other Ambulatory Visit: Payer: Self-pay | Admitting: Family Medicine

## 2023-08-05 DIAGNOSIS — E1169 Type 2 diabetes mellitus with other specified complication: Secondary | ICD-10-CM

## 2023-08-05 MED ORDER — ATORVASTATIN CALCIUM 20 MG PO TABS
20.0000 mg | ORAL_TABLET | Freq: Every day | ORAL | 0 refills | Status: DC
  Filled 2023-08-05: qty 90, 90d supply, fill #0

## 2023-08-06 ENCOUNTER — Other Ambulatory Visit: Payer: Self-pay | Admitting: Family Medicine

## 2023-08-06 NOTE — Telephone Encounter (Signed)
 Please advise on med, pt believes she has another yeast infection (gets these often due to her diabetes)

## 2023-08-06 NOTE — Telephone Encounter (Signed)
 Copied from CRM [id number]. Topic: Clinical - Medication Refill >> Aug 06, 2023  9:24 AM [name] wrote: Medication: flucanazol For urinary tract pill  Has the patient contacted their pharmacy? No (Agent: If no, request that the patient contact the pharmacy for the refill. If patient does not wish to contact the pharmacy document the reason why and proceed with request.) (Agent: If yes, when and what did the pharmacy advise?)  This is the patient's preferred pharmacy:  [facility] [phone] Jonette Nestle, [place] - [address] [place] [address] Phone: [phone] Fax: [phone]   Is this the correct pharmacy for this prescription? Yes If no, delete pharmacy and type the correct one.   Has the prescription been filled recently? No  Is the patient out of the medication? Yes  Has the patient been seen for an appointment in the last year OR does the patient have an upcoming appointment? Yes  Can we respond through MyChart? No  Agent: Please be advised that Rx refills may take up to 3 business days. We ask that you follow-up with your pharmacy.

## 2023-08-16 ENCOUNTER — Other Ambulatory Visit: Payer: Self-pay | Admitting: Family Medicine

## 2023-08-16 ENCOUNTER — Telehealth: Payer: Self-pay

## 2023-08-16 MED ORDER — FLUCONAZOLE 150 MG PO TABS: 150.0000 mg | ORAL_TABLET | Freq: Once | ORAL | 0 refills | Status: AC

## 2023-08-16 NOTE — Addendum Note (Signed)
 Addended by: Keighan Amezcua E on: 08/16/2023 01:12 PM   Modules accepted: Orders

## 2023-08-16 NOTE — Telephone Encounter (Signed)
 Rx sent.

## 2023-08-16 NOTE — Telephone Encounter (Signed)
 Rx was last sent 6/3, please advise if another refill can be sent or if need ov

## 2023-08-16 NOTE — Telephone Encounter (Signed)
 Copied from CRM [id number]. Topic: General - Other >> Aug 16, 2023 10:35 AM Howard Macho wrote: Reason for CRM: patient called stating she would like another refill of the flucanazol because one did not work  CB [phone]

## 2023-08-21 ENCOUNTER — Telehealth: Payer: Self-pay | Admitting: Family Medicine

## 2023-08-21 NOTE — Progress Notes (Unsigned)
 MyChart Video Visit    Virtual Visit via Video Note    Patient location: Home. Patient and provider in visit Provider location: Office 2 patient identifiers used   I discussed the limitations of evaluation and management by telemedicine and the availability of in person appointments. The patient expressed understanding and agreed to proceed.  Visit Date: 08/22/2023  Today's healthcare provider: Alyson Back, NP-C     Subjective:    Patient ID: [name] [name], female    DOB: 1960-11-15, 63 y.o.   MRN: [id number]  No chief complaint on file.   HPI  C/o urinary frequency and urgency x 10 days.   No fever, chills, abdominal pain, back pain, N/V/D.   Last UTI was 4 months ago.   Endocrinologist scheduled for July 1st.   States she is out of glipizide  and needs a refill. Reports taking Trulicity  weekly.     Past Medical History:  Diagnosis Date   Abdominal hernia    4 hernias per pt   Abdominal pain    mid abd pain radiating to right side   Anemia    Cancer (HCC)    rectal ca   Change in bowel movement    Colon cancer (HCC)    Complication of anesthesia    cardiac arrest during nasal surgery   Diabetes mellitus without complication (HCC)    Headache    History of rectal bleeding     Past Surgical History:  Procedure Laterality Date   CESAREAN SECTION     colon cancer     COLON SURGERY     COLONOSCOPY     LAPAROSCOPIC LYSIS OF ADHESIONS  04/02/2014   Procedure: LAPAROSCOPIC LYSIS OF ADHESIONS;  Surgeon: Adalberto Hollow, MD;  Location: [facility];  Service: General;;   NOSE SURGERY     POLYPECTOMY     VENTRAL HERNIA REPAIR N/A 04/02/2014   Procedure: LAPAROSCOPIC  LYSIS OF ADHESIONS AND REPAIR OF VENTRAL INCISIONAL HERNIAS WITH MESH;  Surgeon: Adalberto Hollow, MD;  Location: [facility];  Service: General;  Laterality: N/A;    Family History  Problem Relation Age of Onset   Uterine cancer Mother    Colon cancer Maternal Aunt    Breast cancer Paternal  Aunt    Colon cancer Maternal Grandmother    Breast cancer Cousin    Breast cancer Cousin    Diabetes Neg Hx    Rectal cancer Neg Hx    Stomach cancer Neg Hx     Social History   Socioeconomic History   Marital status: Married    Spouse name: Not on file   Number of children: 3   Years of education: 12   Highest education level: Not on file  Occupational History   Occupation: Customer Service  Tobacco Use   Smoking status: Former    Current packs/day: 0.00    Average packs/day: 0.3 packs/day for 13.0 years (3.3 ttl pk-yrs)    Types: Cigarettes    Start date: 03/13/1990    Quit date: 03/14/2003    Years since quitting: 20.4   Smokeless tobacco: Never  Vaping Use   Vaping status: Some Days   Substances: Flavoring   Devices: flavored , non-nicotine  Substance and Sexual Activity   Alcohol use: Yes    Alcohol/week: 0.0 standard drinks of alcohol    Comment: occasional    Drug use: No   Sexual activity: Not Currently  Other Topics Concern   Not on file  Social History Narrative  Fun: Camping, Cruise    Denies abuse and feels safe at home.    Social Drivers of Corporate investment banker Strain: Not on file  Food Insecurity: No Food Insecurity (05/13/2023)   Hunger Vital Sign    Worried About Running Out of Food in the Last Year: Never true    Ran Out of Food in the Last Year: Never true  Transportation Needs: No Transportation Needs (05/13/2023)   PRAPARE - Administrator, Civil Service (Medical): No    Lack of Transportation (Non-Medical): No  Physical Activity: Not on file  Stress: Not on file  Social Connections: Not on file  Intimate Partner Violence: Not At Risk (05/13/2023)   Humiliation, Afraid, Rape, and Kick questionnaire    Fear of Current or Ex-Partner: No    Emotionally Abused: No    Physically Abused: No    Sexually Abused: No    Outpatient Medications Prior to Visit  Medication Sig Dispense Refill   atorvastatin  (LIPITOR) 20 MG tablet  Take 1 tablet (20 mg total) by mouth daily. 90 tablet 0   Cholecalciferol (D3 VITAMIN PO) Take 1 capsule by mouth daily.     Continuous Glucose Sensor (FREESTYLE LIBRE 3 SENSOR) MISC Apply 1 sensor to the skin every 14 days for continuous glucose monitoring. (Patient not taking: Reported on 05/13/2023) 2 each 11   dicyclomine  (BENTYL ) 10 MG capsule Take 1 capsule (10 mg total) by mouth every 6 (six) hours as needed for up to 5 days for spasms (cramping). 20 capsule 0   Dulaglutide  (TRULICITY ) 3 MG/0.5ML SOAJ Inject 3 mg into the skin once a week. 6 mL 0   Multiple Vitamin (MULTIVITAMIN WITH MINERALS) TABS tablet Take 1 tablet by mouth daily.     pantoprazole  (PROTONIX ) 40 MG tablet Take 1 tablet (40 mg total) by mouth daily. 30 tablet 0   polyethylene glycol powder (MIRALAX ) 17 GM/SCOOP powder Take 17 g by mouth 2 (two) times daily as needed for moderate constipation or mild constipation. 255 g 2   glipiZIDE  (GLIPIZIDE  XL) 5 MG 24 hr tablet Take 1 tablet (5 mg total) by mouth daily with breakfast. 90 tablet 0   No facility-administered medications prior to visit.    Allergies  Allergen Reactions   Epinephrine  Anaphylaxis   Lidocaine  Anaphylaxis   Lisinopril  Other (See Comments)    itching   Metformin  And Related Nausea Only    GI upset    Review of Systems  Constitutional:  Negative for chills, fever and malaise/fatigue.  Eyes:  Negative for blurred vision and double vision.  Respiratory:  Negative for cough and shortness of breath.   Cardiovascular:  Negative for chest pain, palpitations and leg swelling.  Gastrointestinal:  Negative for abdominal pain, constipation, diarrhea, nausea and vomiting.  Genitourinary:  Positive for frequency and urgency. Negative for dysuria, flank pain and hematuria.  Musculoskeletal:  Negative for back pain.  Neurological:  Negative for dizziness, focal weakness and headaches.       Objective:    Physical Exam Constitutional:      General: She is  not in acute distress.    Appearance: She is not ill-appearing.   Eyes:     Extraocular Movements: Extraocular movements intact.    Musculoskeletal:        General: Normal range of motion.   Neurological:     Mental Status: She is alert.   Psychiatric:        Mood and Affect: Mood normal.  Behavior: Behavior normal.        Thought Content: Thought content normal.     There were no vitals taken for this visit. Wt Readings from Last 3 Encounters:  05/13/23 180 lb (81.6 kg)  04/11/23 182 lb (82.6 kg)  12/07/21 189 lb (85.7 kg)       Assessment & Plan:   Problem List Items Addressed This Visit     Uncontrolled type 2 diabetes mellitus with hyperglycemia (HCC)   Relevant Medications   glipiZIDE  (GLIPIZIDE  XL) 5 MG 24 hr tablet   Urinary frequency - Primary   This was a 2 part visit.  Started as a virtual visit and then saw patient in the office.  She will come in for nurse visit at 11 AM today for point-of-care urinalysis dipstick.  I also asked her to check her blood sugar at home and report that to the nurse when she arrives.  If needed, we can check her blood sugar (point-of-care glucose) when she arrives.  I will also order a urine culture.  Patient came in for a nurse visit.   Reports FBS at home was 187 and then after taking glipizide  her blood sugar improved to 137.  She has an appointment with endocrinology July 1 for her diabetes. Urinalysis dipstick positive for glucose 3+, negative otherwise.  Spoke with patient in the room and advised her that she does not appear to have a urinary tract infection and that her blood sugars being elevated are most likely causing her urinary frequency. She will continue glipizide  and Trulicity  and see endocrinology as scheduled. I will follow-up pending urine culture result.   I am having [name] [name] maintain her multivitamin with minerals, FreeStyle Libre 3 Sensor, Cholecalciferol (D3 VITAMIN PO), dicyclomine ,  pantoprazole , polyethylene glycol powder, Trulicity , atorvastatin , and glipiZIDE .  Meds ordered this encounter  Medications   glipiZIDE  (GLIPIZIDE  XL) 5 MG 24 hr tablet    Sig: Take 1 tablet (5 mg total) by mouth daily with breakfast.    Dispense:  90 tablet    Refill:  0    Supervising Provider:   Bambi Lever A [[id number]]    I discussed the assessment and treatment plan with the patient. The patient was provided an opportunity to ask questions and all were answered. The patient agreed with the plan and demonstrated an understanding of the instructions.   The patient was advised to call back or seek an in-person evaluation if the symptoms worsen or if the condition fails to improve as anticipated.   Alyson Back, NP-C [facility] [phone] (phone) [phone] (fax)  [facility]

## 2023-08-21 NOTE — Telephone Encounter (Signed)
 Called pt and made VV appt per vickie for 9 am

## 2023-08-21 NOTE — Telephone Encounter (Signed)
 Copied from CRM [id number]. Topic: Clinical - Request for Lab/Test Order >> Aug 21, 2023  3:23 PM [name] [name] wrote: Reason for CRM: Pt is wanting to have a urine culture test scheduled. Pt stated that she still feels pressure and is urinating every 2 hours. Pt would like a callback regarding this request.

## 2023-08-22 ENCOUNTER — Encounter: Payer: Self-pay | Admitting: Family Medicine

## 2023-08-22 ENCOUNTER — Ambulatory Visit

## 2023-08-22 ENCOUNTER — Telehealth: Admitting: Family Medicine

## 2023-08-22 DIAGNOSIS — R351 Nocturia: Secondary | ICD-10-CM | POA: Diagnosis not present

## 2023-08-22 DIAGNOSIS — Z7984 Long term (current) use of oral hypoglycemic drugs: Secondary | ICD-10-CM | POA: Diagnosis not present

## 2023-08-22 DIAGNOSIS — E1165 Type 2 diabetes mellitus with hyperglycemia: Secondary | ICD-10-CM

## 2023-08-22 DIAGNOSIS — R350 Frequency of micturition: Secondary | ICD-10-CM | POA: Diagnosis not present

## 2023-08-22 LAB — POC URINALSYSI DIPSTICK (AUTOMATED)
Bilirubin, UA: NEGATIVE
Blood, UA: POSITIVE
Glucose, UA: POSITIVE — AB
Ketones, UA: NEGATIVE
Leukocytes, UA: NEGATIVE
Nitrite, UA: NEGATIVE
Protein, UA: NEGATIVE
Spec Grav, UA: 1.015 (ref 1.010–1.025)
Urobilinogen, UA: 0.2 U/dL
pH, UA: 6 (ref 5.0–8.0)

## 2023-08-22 MED ORDER — GLIPIZIDE ER 5 MG PO TB24: 5.0000 mg | ORAL_TABLET | Freq: Every day | ORAL | 0 refills | Status: DC

## 2023-08-22 NOTE — Progress Notes (Signed)
 Pt came in today for POC UA. Completed and made provider aware of results.

## 2023-08-22 NOTE — Addendum Note (Signed)
 Addended byArch Beans, Tavarus Poteete P on: 08/22/2023 01:02 PM   Modules accepted: Orders

## 2023-08-23 LAB — URINE CULTURE

## 2023-08-25 ENCOUNTER — Ambulatory Visit: Payer: Self-pay | Admitting: Family Medicine

## 2023-10-01 ENCOUNTER — Telehealth: Payer: Self-pay | Admitting: Family Medicine

## 2023-10-01 ENCOUNTER — Other Ambulatory Visit: Payer: Self-pay

## 2023-10-01 DIAGNOSIS — M5416 Radiculopathy, lumbar region: Secondary | ICD-10-CM

## 2023-10-01 NOTE — Telephone Encounter (Signed)
 Patient called asking if another epidural injection could be ordered for her? She said the pain is the same as last time.  Does the patient need an appointment with Dr Claudene prior to ordering?  Please advise.

## 2023-10-01 NOTE — Telephone Encounter (Signed)
 Copied from CRM [id number]. Topic: Clinical - Request for Lab/Test Order >> Oct 01, 2023 12:01 PM Burnard DEL wrote: Reason for CRM: Patient needs an order to receive an epidural shot  for hernia that are in her stomach that she use to get order from Dr Arthea. Patient stated that Dr Arthea isnt at the office to give the order for the epidural shot,therefore she would need a new order. Sent to [facility]. The shot usually lasts for a year.Patient stated that she feels the discomfort coming back and she would like to get the epidural shot.  ---  Unsure of who to direct this to, please assist. TDW

## 2023-10-01 NOTE — Telephone Encounter (Signed)
 Called pt and informed her she will need to go back to Sports Medicine for these orders as they were the ones who have been managing this. Pt states she will give them a call to schedule an appointment.

## 2023-10-03 ENCOUNTER — Encounter: Payer: Self-pay | Admitting: Family Medicine

## 2023-10-09 NOTE — Discharge Instructions (Signed)

## 2023-10-11 ENCOUNTER — Inpatient Hospital Stay
Admission: RE | Admit: 2023-10-11 | Discharge: 2023-10-11 | Disposition: A | Discharge status: Home / Self Care | Source: Ambulatory Visit | Attending: Family Medicine | Admitting: Family Medicine

## 2023-10-15 ENCOUNTER — Ambulatory Visit: Payer: Medicaid Other | Admitting: Family Medicine

## 2023-10-15 NOTE — Telephone Encounter (Signed)
 Scheduled with Dr. Leonce 11/01/2023.

## 2023-10-17 ENCOUNTER — Ambulatory Visit (INDEPENDENT_AMBULATORY_CARE_PROVIDER_SITE_OTHER): Admitting: Family Medicine

## 2023-10-17 ENCOUNTER — Encounter: Payer: Self-pay | Admitting: Family Medicine

## 2023-10-17 VITALS — BP 126/78 | HR 85 | Temp 97.6°F | Ht 60.0 in | Wt 179.0 lb

## 2023-10-17 DIAGNOSIS — Z7985 Long-term (current) use of injectable non-insulin antidiabetic drugs: Secondary | ICD-10-CM

## 2023-10-17 DIAGNOSIS — E1165 Type 2 diabetes mellitus with hyperglycemia: Secondary | ICD-10-CM | POA: Diagnosis not present

## 2023-10-17 DIAGNOSIS — E538 Deficiency of other specified B group vitamins: Secondary | ICD-10-CM

## 2023-10-17 DIAGNOSIS — E1169 Type 2 diabetes mellitus with other specified complication: Secondary | ICD-10-CM | POA: Diagnosis not present

## 2023-10-17 DIAGNOSIS — F32A Depression, unspecified: Secondary | ICD-10-CM | POA: Insufficient documentation

## 2023-10-17 DIAGNOSIS — E785 Hyperlipidemia, unspecified: Secondary | ICD-10-CM | POA: Diagnosis not present

## 2023-10-17 DIAGNOSIS — F419 Anxiety disorder, unspecified: Secondary | ICD-10-CM

## 2023-10-17 DIAGNOSIS — E559 Vitamin D deficiency, unspecified: Secondary | ICD-10-CM | POA: Diagnosis not present

## 2023-10-17 LAB — COMPREHENSIVE METABOLIC PANEL WITH GFR
ALT: 11 U/L (ref 0–35)
AST: 13 U/L (ref 0–37)
Albumin: 3.9 g/dL (ref 3.5–5.2)
Alkaline Phosphatase: 67 U/L (ref 39–117)
BUN: 11 mg/dL (ref 6–23)
CO2: 27 meq/L (ref 19–32)
Calcium: 8.8 mg/dL (ref 8.4–10.5)
Chloride: 107 meq/L (ref 96–112)
Creatinine, Ser: 1 mg/dL (ref 0.40–1.20)
GFR: 59.49 mL/min — ABNORMAL LOW (ref >60.00)
Glucose, Bld: 216 mg/dL — ABNORMAL HIGH (ref 70–99)
Potassium: 3.9 meq/L (ref 3.5–5.1)
Sodium: 141 meq/L (ref 135–145)
Total Bilirubin: 0.4 mg/dL (ref 0.2–1.2)
Total Protein: 6.6 g/dL (ref 6.0–8.3)

## 2023-10-17 LAB — CBC WITH DIFFERENTIAL/PLATELET
Basophils Absolute: 0 K/uL (ref 0.0–0.1)
Basophils Relative: 0.4 % (ref 0.0–3.0)
Eosinophils Absolute: 0.1 K/uL (ref 0.0–0.7)
Eosinophils Relative: 1.8 % (ref 0.0–5.0)
HCT: 36.5 % (ref 36.0–46.0)
Hemoglobin: 11.6 g/dL — ABNORMAL LOW (ref 12.0–15.0)
Lymphocytes Relative: 46.5 % — ABNORMAL HIGH (ref 12.0–46.0)
Lymphs Abs: 1.7 K/uL (ref 0.7–4.0)
MCHC: 31.6 g/dL (ref 30.0–36.0)
MCV: 69.7 fl — ABNORMAL LOW (ref 78.0–100.0)
Monocytes Absolute: 0.3 K/uL (ref 0.1–1.0)
Monocytes Relative: 8.7 % (ref 3.0–12.0)
Neutro Abs: 1.5 K/uL (ref 1.4–7.7)
Neutrophils Relative %: 42.6 % — ABNORMAL LOW (ref 43.0–77.0)
Platelets: 216 K/uL (ref 150.0–400.0)
RBC: 5.25 Mil/uL — ABNORMAL HIGH (ref 3.87–5.11)
RDW: 14.8 % (ref 11.5–15.5)
WBC: 3.6 K/uL — ABNORMAL LOW (ref 4.0–10.5)

## 2023-10-17 LAB — VITAMIN D 25 HYDROXY (VIT D DEFICIENCY, FRACTURES): VITD: 27.72 ng/mL — ABNORMAL LOW (ref 30.00–100.00)

## 2023-10-17 LAB — MICROALBUMIN / CREATININE URINE RATIO
Creatinine,U: 335.4 mg/dL
Microalb Creat Ratio: 5.4 mg/g (ref 0.0–30.0)
Microalb, Ur: 1.8 mg/dL (ref 0.0–1.9)

## 2023-10-17 LAB — TSH: TSH: 1.36 u[IU]/mL (ref 0.35–5.50)

## 2023-10-17 LAB — VITAMIN B12: Vitamin B-12: 240 pg/mL (ref 211–911)

## 2023-10-17 LAB — LDL CHOLESTEROL, DIRECT: Direct LDL: 86 mg/dL

## 2023-10-17 NOTE — Assessment & Plan Note (Signed)
 Reports anxiety and depression related to attempt to find a new job.  Declines counseling or medication

## 2023-10-17 NOTE — Progress Notes (Signed)
 Subjective:     Patient ID: [name] [name], female    DOB: 12-23-1960, 63 y.o.   MRN: [id number]  Chief Complaint  Patient presents with   Medical Management of Chronic Issues    6 month f/u    HPI  Discussed the use of AI scribe software for clinical note transcription with the patient, who gave verbal consent to proceed.  History of Present Illness [name] [name] is a 63 year old female with diabetes, vitamin D  deficiency, vitamin B12 deficiency, and hyperlipidemia who presents for a six-month follow-up for chronic health conditions.  Hyperglycemia and diabetes management - She is under the care of endocrinology for diabetes -Hemoglobin A1c was 10.4 one month ago - Initiated Ozempic one month ago, resulting in appetite suppression  - Uses a continuous glucose monitor - Experienced one episode of hypoglycemia while away for a weekend, with no recurrence since returning home  Hyperlipidemia - Takes atorvastatin  every evening for lipid management  Chronic back pain  - Back pain began in 2023, initially  - Manages pain with ibuprofen   Vitamin d  deficiency - Completed three months of high-dose prescription vitamin D  - Currently takes daily over-the-counter vitamin D  supplementation  Vitamin b12 deficiency - Received vitamin B12 injections earlier this year - Currently takes over-the-counter vitamin B12 supplementation  Mood disturbance and occupational stress - Mood disturbance attributed to job-related stress and multiple pending job applications - Describes feeling 'terrible' due to work-life balance challenges - Not interested in taking medication or seeing a therapist for anxiety or depression - 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      Health Maintenance Due  Topic Date Due   Zoster Vaccines- Shingrix (1 of 2) Never done   COVID-19 Vaccine (3 - Pfizer risk series) 07/08/2019   Diabetic kidney evaluation - Urine ACR   04/09/2020   OPHTHALMOLOGY EXAM  01/17/2021   Cervical Cancer Screening (HPV/Pap Cotest)  10/06/2023    Past Medical History:  Diagnosis Date   Abdominal hernia    4 hernias per pt   Abdominal pain    mid abd pain radiating to right side   Anemia    Cancer (HCC)    rectal ca   Change in bowel movement    Colon cancer (HCC)    Complication of anesthesia    cardiac arrest during nasal surgery   Diabetes mellitus without complication (HCC)    Headache    History of rectal bleeding     Past Surgical History:  Procedure Laterality Date   CESAREAN SECTION     colon cancer     COLON SURGERY     COLONOSCOPY     LAPAROSCOPIC LYSIS OF ADHESIONS  04/02/2014   Procedure: LAPAROSCOPIC LYSIS OF ADHESIONS;  Surgeon: Krystal Russell, MD;  Location: [facility];  Service: General;;   NOSE SURGERY     POLYPECTOMY     VENTRAL HERNIA REPAIR N/A 04/02/2014   Procedure: LAPAROSCOPIC  LYSIS OF ADHESIONS AND REPAIR OF VENTRAL INCISIONAL HERNIAS WITH MESH;  Surgeon: Krystal Russell, MD;  Location: [facility];  Service: General;  Laterality: N/A;    Family History  Problem Relation Age of Onset   Uterine cancer Mother    Colon cancer Maternal Aunt    Breast cancer Paternal Aunt    Colon cancer Maternal Grandmother    Breast cancer Cousin    Breast cancer Cousin    Diabetes Neg Hx    Rectal cancer Neg Hx  Stomach cancer Neg Hx     Social History   Socioeconomic History   Marital status: Married    Spouse name: Not on file   Number of children: 3   Years of education: 12   Highest education level: GED or equivalent  Occupational History   Occupation: Clinical biochemist  Tobacco Use   Smoking status: Former    Current packs/day: 0.00    Average packs/day: 0.3 packs/day for 13.0 years (3.3 ttl pk-yrs)    Types: Cigarettes    Start date: 03/13/1990    Quit date: 03/14/2003    Years since quitting: 20.6   Smokeless tobacco: Never  Vaping Use   Vaping status: Some Days   Substances: Flavoring    Devices: flavored , non-nicotine  Substance and Sexual Activity   Alcohol use: Yes    Alcohol/week: 0.0 standard drinks of alcohol    Comment: occasional    Drug use: No   Sexual activity: Not Currently  Other Topics Concern   Not on file  Social History Narrative   Fun: Camping, Cruise    Denies abuse and feels safe at home.    Social Drivers of Health   Financial Resource Strain: Medium Risk (10/13/2023)   Overall Financial Resource Strain (CARDIA)    Difficulty of Paying Living Expenses: Somewhat hard  Food Insecurity: Food Insecurity Present (10/13/2023)   Hunger Vital Sign    Worried About Running Out of Food in the Last Year: Sometimes true    Ran Out of Food in the Last Year: Never true  Transportation Needs: No Transportation Needs (10/13/2023)   PRAPARE - Administrator, Civil Service (Medical): No    Lack of Transportation (Non-Medical): No  Physical Activity: Insufficiently Active (10/13/2023)   Exercise Vital Sign    Days of Exercise per Week: 3 days    Minutes of Exercise per Session: 30 min  Stress: No Stress Concern Present (10/13/2023)   [employer] of Occupational Health - Occupational Stress Questionnaire    Feeling of Stress: Only a little  Social Connections: Moderately Integrated (10/13/2023)   Social Connection and Isolation Panel    Frequency of Communication with Friends and Family: More than three times a week    Frequency of Social Gatherings with Friends and Family: Once a week    Attends Religious Services: More than 4 times per year    Active Member of [employer] or Organizations: No    Attends Banker Meetings: Not on file    Marital Status: Married  Catering manager Violence: Not At Risk (05/13/2023)   Humiliation, Afraid, Rape, and Kick questionnaire    Fear of Current or Ex-Partner: No    Emotionally Abused: No    Physically Abused: No    Sexually Abused: No    Outpatient Medications Prior to Visit  Medication Sig  Dispense Refill   atorvastatin  (LIPITOR) 20 MG tablet Take 1 tablet (20 mg total) by mouth daily. 90 tablet 0   Cholecalciferol (D3 VITAMIN PO) Take 1 capsule by mouth daily.     dicyclomine  (BENTYL ) 10 MG capsule Take 1 capsule (10 mg total) by mouth every 6 (six) hours as needed for up to 5 days for spasms (cramping). 20 capsule 0   glipiZIDE  (GLIPIZIDE  XL) 5 MG 24 hr tablet Take 1 tablet (5 mg total) by mouth daily with breakfast. 90 tablet 0   LANTUS  SOLOSTAR 100 UNIT/ML Solostar Pen Inject 20 units under the skin daily.  Multiple Vitamin (MULTIVITAMIN WITH MINERALS) TABS tablet Take 1 tablet by mouth daily.     OZEMPIC, 0.25 OR 0.5 MG/DOSE, 2 MG/3ML SOPN PLEASE SEE ATTACHED FOR DETAILED DIRECTIONS     pantoprazole  (PROTONIX ) 40 MG tablet Take 1 tablet (40 mg total) by mouth daily. 30 tablet 0   polyethylene glycol powder (MIRALAX ) 17 GM/SCOOP powder Take 17 g by mouth 2 (two) times daily as needed for moderate constipation or mild constipation. 255 g 2   Dulaglutide  (TRULICITY ) 3 MG/0.5ML SOAJ Inject 3 mg into the skin once a week. (Patient not taking: Reported on 10/17/2023) 6 mL 0   No facility-administered medications prior to visit.    Allergies  Allergen Reactions   Epinephrine  Anaphylaxis   Lidocaine  Anaphylaxis   Lisinopril  Other (See Comments)    itching   Metformin  And Related Nausea Only    GI upset    Review of Systems  Constitutional:  Negative for chills, fever and malaise/fatigue.  Eyes:  Negative for blurred vision and double vision.  Respiratory:  Negative for shortness of breath.   Cardiovascular:  Negative for chest pain, palpitations and leg swelling.  Gastrointestinal:  Negative for abdominal pain, constipation, diarrhea, nausea and vomiting.  Genitourinary:  Negative for dysuria, frequency and urgency.  Musculoskeletal:  Positive for back pain.  Neurological:  Negative for dizziness, tingling, focal weakness and headaches.  Psychiatric/Behavioral:   Positive for depression. Negative for suicidal ideas. The patient is nervous/anxious.        Objective:    Physical Exam Constitutional:      General: She is not in acute distress.    Appearance: She is not ill-appearing.  Eyes:     Extraocular Movements: Extraocular movements intact.     Conjunctiva/sclera: Conjunctivae normal.  Cardiovascular:     Rate and Rhythm: Normal rate.  Pulmonary:     Effort: Pulmonary effort is normal.  Musculoskeletal:     Cervical back: Normal range of motion and neck supple.  Skin:    General: Skin is warm and dry.  Neurological:     General: No focal deficit present.     Mental Status: She is alert and oriented to person, place, and time.     Motor: No weakness.     Coordination: Coordination normal.     Gait: Gait normal.  Psychiatric:        Mood and Affect: Mood normal.        Behavior: Behavior normal.        Thought Content: Thought content normal.      BP 126/78   Pulse 85   Temp 97.6 F (36.4 C) (Temporal)   Ht 5' (1.524 m)   Wt 179 lb (81.2 kg)   SpO2 97%   BMI 34.96 kg/m  Wt Readings from Last 3 Encounters:  10/17/23 179 lb (81.2 kg)  05/13/23 180 lb (81.6 kg)  04/11/23 182 lb (82.6 kg)       Assessment & Plan:   Problem List Items Addressed This Visit     Anxiety and depression   Reports anxiety and depression related to attempt to find a new job.  Declines counseling or medication      Hyperlipidemia associated with type 2 diabetes mellitus (HCC) - Primary   Relevant Medications   OZEMPIC, 0.25 OR 0.5 MG/DOSE, 2 MG/3ML SOPN   LANTUS  SOLOSTAR 100 UNIT/ML Solostar Pen   Other Relevant Orders   Direct LDL   Uncontrolled type 2 diabetes mellitus with hyperglycemia (HCC)  Relevant Medications   OZEMPIC, 0.25 OR 0.5 MG/DOSE, 2 MG/3ML SOPN   LANTUS  SOLOSTAR 100 UNIT/ML Solostar Pen   Other Relevant Orders   Microalbumin / creatinine urine ratio   CBC with Differential/Platelet   Comprehensive metabolic panel  with GFR   TSH   Vitamin D  deficiency   Relevant Orders   VITAMIN D  25 Hydroxy (Vit-D Deficiency, Fractures)   Other Visit Diagnoses       Vitamin B12 deficiency       Relevant Orders   Vitamin B12      Assessment and Plan Assessment & Plan Type 2 diabetes mellitus with hyperglycemia Type 2 diabetes mellitus with hyperglycemia, managed by endocrinologist. She was started on Ozempic. A1c was 10.4, indicating poor glycemic control. Reports episodes of hypoglycemia, particularly during a recent trip, but these have resolved since returning home. Continuous glucose monitoring is in use. - Continue Ozempic for diabetes management - Check LDL cholesterol level, goal <70 due to DM - Check kidney function - Encourage maintaining hydration and regular meals to prevent hypoglycemia - Reports eye exam scheduled for October  Vitamin D  deficiency Vitamin D  deficiency, currently taking over-the-counter vitamin D  supplements daily. - Check vitamin D  level  Vitamin B12 deficiency Vitamin B12 deficiency, currently taking over-the-counter B12 supplements. - Check vitamin B12 level  Hyperlipidemia Hyperlipidemia, currently managed with atorvastatin . Previous LDL was 100, with a target of less than 70 due to diabetes. - Continue atorvastatin  - Check LDL cholesterol level  Advised that she is due for Shingrix states Pap smear is up-to-date and [facility].   I have discontinued Kamrin Levert's Trulicity . I am also having her maintain her multivitamin with minerals, Cholecalciferol (D3 VITAMIN PO), dicyclomine , pantoprazole , polyethylene glycol powder, atorvastatin , glipiZIDE , Ozempic (0.25 or 0.5 MG/DOSE), and Lantus  SoloStar.  No orders of the defined types were placed in this encounter.

## 2023-10-17 NOTE — Patient Instructions (Signed)
 Please go downstairs for labs and a urine test before you leave.  I will be in touch with your results.  Records show that you are due for your eye exam and as you said, that is scheduled for October.  I recommend getting the shingles vaccine at your pharmacy.

## 2023-10-18 ENCOUNTER — Other Ambulatory Visit

## 2023-10-21 ENCOUNTER — Ambulatory Visit: Payer: Self-pay | Admitting: Family Medicine

## 2023-10-21 DIAGNOSIS — E1169 Type 2 diabetes mellitus with other specified complication: Secondary | ICD-10-CM

## 2023-10-21 NOTE — Progress Notes (Signed)
 She has low normal vitamin B12 and anemia. I recommend getting monthly B12 injections if she is willing. Is she taking her atorvastatin  everyday? If so, we may need to increase her dose. I would like to see her back in 3 months instead of 6 months to recheck labs due to anemia.

## 2023-10-24 MED ORDER — ATORVASTATIN CALCIUM 40 MG PO TABS: 40.0000 mg | ORAL_TABLET | Freq: Every day | ORAL | 0 refills | Status: DC

## 2023-10-25 ENCOUNTER — Ambulatory Visit

## 2023-10-25 DIAGNOSIS — E538 Deficiency of other specified B group vitamins: Secondary | ICD-10-CM

## 2023-10-25 MED ORDER — CYANOCOBALAMIN 1000 MCG/ML IJ SOLN
1000.0000 ug | Freq: Once | INTRAMUSCULAR | Status: AC
  Administered 2023-10-25: 1000 ug via INTRAMUSCULAR

## 2023-10-25 NOTE — Progress Notes (Signed)
 After obtaining consent, and per orders of [facility] NP-C , injection of B12 given by Ferdie Ping. Patient instructed to report any adverse reaction to me immediately.

## 2023-10-31 NOTE — Progress Notes (Unsigned)
 Ben Jackson D.[facility] [address] [place] [address] Phone: [phone]   Assessment and Plan:     1. Chronic bilateral low back pain with sciatica, sciatica laterality unspecified (Primary) 2. Lumbar radiculopathy - Chronic with exacerbation, subsequent visit - Recurrence of low back pain with right sided radicular symptoms consistent with degenerative changes in lumbar spine and lumbar radiculopathy - Recommend lumbar MRI for further evaluation based on continued symptoms, degenerative changes on prior imaging, pain >6/10, pain with day-to-day activities - Start Tylenol  [phone] mg 2-3 times a day as needed for day-to-day pain relief - Continue physical activity as tolerated   3. Neck pain 4. Strain of left trapezius muscle, initial encounter 5. Strain of right trapezius muscle, initial encounter -Chronic with exacerbation, initial sports medicine visit - Chronic neck pain with symptoms radiating into bilateral trapezius muscles and shoulders.  Consistent with either muscular pain of cervical paraspinal and bilateral trapezius or underlying degenerative changes of cervical spine causing pain and radicular symptoms - Will obtain x-ray at today's visit and will review at follow-up visit. -Start meloxicam  15 mg daily for 3 weeks.  After completing meloxicam  course, may transition to Tylenol /ibuprofen as needed - Start HEP for neck  15 additional minutes spent for educating Therapeutic Home Exercise Program.  This included exercises focusing on stretching, strengthening, with focus on eccentric aspects.   Long term goals include an improvement in range of motion, strength, endurance as well as avoiding reinjury. Patient's frequency would include in 1-2 times a day, 3-5 times a week for a duration of 6-12 weeks. Proper technique shown and discussed handout in great detail with ATC.  All questions were discussed and answered.    Pertinent  previous records reviewed include lumbar MRI 2022  Follow-up: Will call patient with MRI results and can order lumbar epidural if appropriate based on imaging.  Would follow-up in clinic in 6 weeks to review benefit from treatment for neck pain and review benefit from epidural CSI.  Would review C-spine x-ray at follow-up visit    Subjective:   I, Madysyn Hanken, am serving as a Neurosurgeon for Doctor Morene Mace  Chief Complaint: low back pain   HPI:   11/01/2023 Patient is a 63 year old female with low back pain. Patient states pain is coming from her neck on the right side radiates down right arm and down her right side. Sometimes the pain radiate around the front of her chest. Left side is okay, but the neck part is new. Patient uses Icy hot. Pain worse when laying on that side, lifting anything, and raising arm to long. Patient does notice weakness, cannot hold a cup. No numbness or tingling.   Patient uses avocado spray   Patient does have hernias and had surgery with mesh to fix it.   Relevant Historical Information: DM type II, history of colon cancer  Additional pertinent review of systems negative.   Current Outpatient Medications:    atorvastatin  (LIPITOR) 40 MG tablet, Take 1 tablet (40 mg total) by mouth daily., Disp: 90 tablet, Rfl: 0   Cholecalciferol (D3 VITAMIN PO), Take 1 capsule by mouth daily., Disp: , Rfl:    glipiZIDE  (GLIPIZIDE  XL) 5 MG 24 hr tablet, Take 1 tablet (5 mg total) by mouth daily with breakfast., Disp: 90 tablet, Rfl: 0   LANTUS  SOLOSTAR 100 UNIT/ML Solostar Pen, Inject 20 units under the skin daily., Disp: , Rfl:    meloxicam  (MOBIC ) 15 MG tablet,  Take 1 tablet (15 mg total) by mouth daily., Disp: 30 tablet, Rfl: 0   Multiple Vitamin (MULTIVITAMIN WITH MINERALS) TABS tablet, Take 1 tablet by mouth daily., Disp: , Rfl:    OZEMPIC, 0.25 OR 0.5 MG/DOSE, 2 MG/3ML SOPN, PLEASE SEE ATTACHED FOR DETAILED DIRECTIONS, Disp: , Rfl:    pantoprazole  (PROTONIX )  40 MG tablet, Take 1 tablet (40 mg total) by mouth daily., Disp: 30 tablet, Rfl: 0   polyethylene glycol powder (MIRALAX ) 17 GM/SCOOP powder, Take 17 g by mouth 2 (two) times daily as needed for moderate constipation or mild constipation., Disp: 255 g, Rfl: 2   Objective:     Vitals:   11/01/23 1123  BP: 118/80  Pulse: 81  SpO2: 98%  Weight: 178 lb (80.7 kg)  Height: 5' (1.524 m)      Body mass index is 34.76 kg/m.    Physical Exam:    Neck Exam: Cervical Spine- Posture normal Skin- normal, intact  Neuro:  Strength-  Right Left   Deltoid (C5) 5/5 5/5  Bicep/Brachioradialis (C5/6) 5/5  5/5  Wrist Extension (C6) 5/5 5/5  Tricep (C7) 5/5 5/5  Wrist Flexion (C7) 5/5 5/5  Grip (C8) 5/5 5/5  Finger Abduction (T1) 5/5 5/5   Sensation: intact to light touch in upper extremities bilaterally  Spurling's:  negative bilaterally Neck ROM: Reduced bilateral sidebending and rotation ROM  TTP: cervical spinous processes, cervical paraspinal, thoracic paraspinal, trapezius   Gen: Appears well, nad, nontoxic and pleasant Psych: Alert and oriented, appropriate mood and affect Neuro: sensation intact, strength is 5/5 in upper and lower extremities, muscle tone wnl Skin: no susupicious lesions or rashes  Back - Normal skin, Spine with normal alignment and no deformity.   No tenderness to vertebral process palpation.   Bilateral lumbar Paraspinous muscles are tender and without spasm  TTP gluteal musculature Straight leg raise right Trendelenberg negative Piriformis Test negative Gait normal   Electronically signed by:  Odis Mace D[facility] 11:45 AM 11/01/23

## 2023-11-01 ENCOUNTER — Ambulatory Visit (INDEPENDENT_AMBULATORY_CARE_PROVIDER_SITE_OTHER): Admitting: Sports Medicine

## 2023-11-01 ENCOUNTER — Ambulatory Visit

## 2023-11-01 VITALS — BP 118/80 | HR 81 | Ht 60.0 in | Wt 178.0 lb

## 2023-11-01 DIAGNOSIS — S46811A Strain of other muscles, fascia and tendons at shoulder and upper arm level, right arm, initial encounter: Secondary | ICD-10-CM

## 2023-11-01 DIAGNOSIS — M5416 Radiculopathy, lumbar region: Secondary | ICD-10-CM

## 2023-11-01 DIAGNOSIS — M5441 Lumbago with sciatica, right side: Secondary | ICD-10-CM | POA: Diagnosis not present

## 2023-11-01 DIAGNOSIS — G8929 Other chronic pain: Secondary | ICD-10-CM

## 2023-11-01 DIAGNOSIS — M5442 Lumbago with sciatica, left side: Secondary | ICD-10-CM

## 2023-11-01 DIAGNOSIS — S46812A Strain of other muscles, fascia and tendons at shoulder and upper arm level, left arm, initial encounter: Secondary | ICD-10-CM

## 2023-11-01 DIAGNOSIS — M542 Cervicalgia: Secondary | ICD-10-CM

## 2023-11-01 MED ORDER — MELOXICAM 15 MG PO TABS: 15.0000 mg | ORAL_TABLET | Freq: Every day | ORAL | 0 refills | Status: DC

## 2023-11-01 NOTE — Patient Instructions (Signed)
 Thank you for coming in today  We will order a lumbar MRI I will call you with results.  If the results are consistent with your previous MRI, I will order an epidural steroid injection and we will follow-up after steroid injection to review its benefit  Start meloxicam  15 mg daily for 3 weeks.  After completing medication, may use Tylenol  and ibuprofen as needed.  Do not use additional NSAIDs while using meloxicam .  Start HEP for neck, trapezius muscles  We will get a neck x-ray on your way out today.  Follow-up in 6 weeks

## 2023-11-11 ENCOUNTER — Ambulatory Visit (INDEPENDENT_AMBULATORY_CARE_PROVIDER_SITE_OTHER)

## 2023-11-11 DIAGNOSIS — S46812A Strain of other muscles, fascia and tendons at shoulder and upper arm level, left arm, initial encounter: Secondary | ICD-10-CM

## 2023-11-11 DIAGNOSIS — S46811A Strain of other muscles, fascia and tendons at shoulder and upper arm level, right arm, initial encounter: Secondary | ICD-10-CM

## 2023-11-11 DIAGNOSIS — M545 Low back pain, unspecified: Secondary | ICD-10-CM

## 2023-11-11 DIAGNOSIS — M5442 Lumbago with sciatica, left side: Secondary | ICD-10-CM

## 2023-11-11 DIAGNOSIS — M542 Cervicalgia: Secondary | ICD-10-CM

## 2023-11-11 DIAGNOSIS — M5416 Radiculopathy, lumbar region: Secondary | ICD-10-CM

## 2023-11-13 ENCOUNTER — Ambulatory Visit: Payer: Self-pay | Admitting: Sports Medicine

## 2023-11-14 ENCOUNTER — Other Ambulatory Visit: Payer: Self-pay | Admitting: Sports Medicine

## 2023-11-14 ENCOUNTER — Telehealth: Payer: Self-pay | Admitting: Sports Medicine

## 2023-11-14 DIAGNOSIS — G8929 Other chronic pain: Secondary | ICD-10-CM

## 2023-11-14 DIAGNOSIS — M5416 Radiculopathy, lumbar region: Secondary | ICD-10-CM

## 2023-11-14 NOTE — Telephone Encounter (Signed)
 Patient called in regards to a missed phone call. Please give patient a call.

## 2023-11-19 ENCOUNTER — Other Ambulatory Visit: Payer: Self-pay | Admitting: Family Medicine

## 2023-11-21 NOTE — Telephone Encounter (Signed)
 Patient is requesting refill on their flunczole .  Pharmacy Name  [phone] [place], [place] - [address] - PHONE: [phone] - FAX: [phone]    Please advise. Patient can be reached at  [phone]

## 2023-11-22 ENCOUNTER — Other Ambulatory Visit: Payer: Self-pay | Admitting: Family Medicine

## 2023-11-22 NOTE — Telephone Encounter (Signed)
 She has had this in the past several times from other providers (from [facility]).  Feels like she is getting a yeast infection, slight itching and irritation when she urinates. Hoping to catch it before it gets worse

## 2023-11-27 ENCOUNTER — Ambulatory Visit (INDEPENDENT_AMBULATORY_CARE_PROVIDER_SITE_OTHER)

## 2023-11-27 VITALS — Ht 60.0 in

## 2023-11-27 DIAGNOSIS — E538 Deficiency of other specified B group vitamins: Secondary | ICD-10-CM

## 2023-11-27 MED ORDER — CYANOCOBALAMIN 1000 MCG/ML IJ SOLN: 1000.0000 ug | Freq: Once | INTRAMUSCULAR | 0 refills | Status: DC

## 2023-11-27 MED ORDER — CYANOCOBALAMIN 1000 MCG/ML IJ SOLN
1000.0000 ug | Freq: Once | INTRAMUSCULAR | Status: AC
  Administered 2023-11-27: 1000 ug via INTRAMUSCULAR

## 2023-11-27 NOTE — Progress Notes (Unsigned)
 Pt here for monthly B12 injection per Boby Mackintosh, NP  B12 1000mcg given IM and pt tolerated injection well.

## 2023-11-28 ENCOUNTER — Encounter: Payer: Self-pay | Admitting: Sports Medicine

## 2023-11-28 NOTE — Discharge Instructions (Signed)

## 2023-11-29 ENCOUNTER — Inpatient Hospital Stay
Admission: RE | Admit: 2023-11-29 | Discharge: 2023-11-29 | Disposition: A | Discharge status: Home / Self Care | Source: Ambulatory Visit | Attending: Sports Medicine | Admitting: Sports Medicine

## 2023-11-29 ENCOUNTER — Telehealth: Payer: Self-pay | Admitting: Radiology

## 2023-11-29 NOTE — Telephone Encounter (Signed)
 Copied from CRM [id number]. Topic: Clinical - Medication Question >> Nov 28, 2023  2:57 PM Lauren C wrote: Reason for CRM: Pt was here yesterday for b12 shot and she said she thought she was supposed to pick up a b12 vitamin at the pharmacy. She says she picked up something at the pharmacy called cyanocobalamin  . This was what she got injected yesterday. She is requesting a call back for clarification [phone]

## 2023-11-29 NOTE — Telephone Encounter (Signed)
 Called pt and informed her it was a mistake when the b12 order was originally placed but if she would like, next time she can bring her vial that ws picked up and we can give her that one instead of charging the office b12

## 2023-11-30 ENCOUNTER — Other Ambulatory Visit: Payer: Self-pay | Admitting: Sports Medicine

## 2023-12-02 NOTE — Telephone Encounter (Signed)
 Last OV 11/01/23 Next OV not scheduled  Last refill 11/01/23 Qty #30/0   Renal labs 10/17/23  Component Ref Range & Units (hover) 1 mo ago (10/17/23) 6 mo ago (05/14/23) 6 mo ago (05/13/23) 6 mo ago (05/12/23) 7 mo ago (04/11/23) 1 yr ago (12/07/21) 2 yr ago (05/23/21)  Sodium 141 141 R 142 R 139 R 140 139 134 Low  R  Potassium 3.9 4.0 R 3.4 Low  R 4.1 R 4.1 3.9 3.8 R  Chloride 107 111 R 108 R 104 R 104 106 100 R  CO2 27 23 R 24 R 24 R 26 27 24  R  Glucose, Bld 216 High  149 High  CM 119 High  CM 206 High  CM 170 High  183 High  475 High  CM  BUN 11 7 Low  R 7 Low  R 7 Low  R 10 10 10  R  Creatinine, Ser 1.00 0.80 R 0.86 R 0.87 R 0.79 0.86 0.89 R  Total Bilirubin 0.4  0.6 R 0.6 R 0.4 0.4   Alkaline Phosphatase 67  57 R 67 R 73 68   AST 13  20 R 28 R 14 13   ALT 11  19 R 23 R 14 11   Total Protein 6.6  6.2 Low  R 6.8 R 6.8 6.6   Albumin 3.9 3.0 Low  R 3.3 Low  R 3.7 R 4.1 3.9   GFR 59.49 Low     79.23 CM 72.23 CM   Comment: Calculated using the CKD-EPI Creatinine Equation (2021)  Calcium  8.8 8.2 Low  R 8.3 Low  R 9.2 R 8.9 8.8 8.5 Low  R

## 2023-12-09 NOTE — Discharge Instructions (Signed)

## 2023-12-10 ENCOUNTER — Ambulatory Visit
Admission: RE | Admit: 2023-12-10 | Discharge: 2023-12-10 | Disposition: A | Discharge status: Home / Self Care | Source: Ambulatory Visit | Attending: Sports Medicine | Admitting: Sports Medicine

## 2023-12-10 DIAGNOSIS — M5416 Radiculopathy, lumbar region: Secondary | ICD-10-CM

## 2023-12-10 DIAGNOSIS — G8929 Other chronic pain: Secondary | ICD-10-CM

## 2023-12-10 MED ORDER — IOPAMIDOL (ISOVUE-M 200) INJECTION 41%
1.0000 mL | Freq: Once | INTRAMUSCULAR | Status: AC
  Administered 2023-12-10: 1 mL via EPIDURAL

## 2023-12-10 MED ORDER — METHYLPREDNISOLONE ACETATE 40 MG/ML INJ SUSP (RADIOLOG
80.0000 mg | Freq: Once | INTRAMUSCULAR | Status: AC
  Administered 2023-12-10: 80 mg via EPIDURAL

## 2023-12-16 ENCOUNTER — Other Ambulatory Visit

## 2023-12-18 LAB — OPHTHALMOLOGY REPORT-SCANNED

## 2023-12-30 ENCOUNTER — Other Ambulatory Visit (HOSPITAL_COMMUNITY): Payer: Self-pay

## 2024-01-08 ENCOUNTER — Other Ambulatory Visit: Payer: Self-pay | Admitting: Family Medicine

## 2024-01-08 DIAGNOSIS — E1169 Type 2 diabetes mellitus with other specified complication: Secondary | ICD-10-CM

## 2024-01-24 ENCOUNTER — Ambulatory Visit: Admitting: Family Medicine

## 2024-01-27 NOTE — Progress Notes (Signed)
 New Patient Office Visit  Subjective    Patient ID: [name] [name], female    DOB: Jun 13, 1960  Age: 63 y.o. MRN: [id number]  CC: No chief complaint on file.   HPI Jayce Boyko presents to establish care  Not seen  Outpatient Encounter Medications as of 01/24/2024  Medication Sig   atorvastatin  (LIPITOR) 40 MG tablet TAKE 1 TABLET BY MOUTH EVERY DAY   Cholecalciferol (D3 VITAMIN PO) Take 1 capsule by mouth daily.   glipiZIDE  (GLUCOTROL  XL) 5 MG 24 hr tablet TAKE 1 TABLET BY MOUTH EVERY DAY WITH BREAKFAST   LANTUS  SOLOSTAR 100 UNIT/ML Solostar Pen Inject 20 units under the skin daily.   meloxicam  (MOBIC ) 15 MG tablet TAKE 1 TABLET (15 MG TOTAL) BY MOUTH DAILY.   Multiple Vitamin (MULTIVITAMIN WITH MINERALS) TABS tablet Take 1 tablet by mouth daily.   OZEMPIC, 0.25 OR 0.5 MG/DOSE, 2 MG/3ML SOPN PLEASE SEE ATTACHED FOR DETAILED DIRECTIONS   polyethylene glycol powder (MIRALAX ) 17 GM/SCOOP powder Take 17 g by mouth 2 (two) times daily as needed for moderate constipation or mild constipation.   No facility-administered encounter medications on file as of 01/24/2024.    Past Medical History:  Diagnosis Date   Abdominal hernia    4 hernias per pt   Abdominal pain    mid abd pain radiating to right side   Anemia    Cancer (HCC)    rectal ca   Change in bowel movement    Colon cancer (HCC)    Complication of anesthesia    cardiac arrest during nasal surgery   Diabetes mellitus without complication (HCC)    Headache    History of rectal bleeding     Past Surgical History:  Procedure Laterality Date   CESAREAN SECTION     colon cancer     COLON SURGERY     COLONOSCOPY     LAPAROSCOPIC LYSIS OF ADHESIONS  04/02/2014   Procedure: LAPAROSCOPIC LYSIS OF ADHESIONS;  Surgeon: Krystal Russell, MD;  Location: [facility];  Service: General;;   NOSE SURGERY     POLYPECTOMY     VENTRAL HERNIA REPAIR N/A 04/02/2014   Procedure: LAPAROSCOPIC  LYSIS OF ADHESIONS AND REPAIR OF VENTRAL  INCISIONAL HERNIAS WITH MESH;  Surgeon: Krystal Russell, MD;  Location: [facility];  Service: General;  Laterality: N/A;    Family History  Problem Relation Age of Onset   Uterine cancer Mother    Colon cancer Maternal Aunt    Breast cancer Paternal Aunt    Colon cancer Maternal Grandmother    Breast cancer Cousin    Breast cancer Cousin    Diabetes Neg Hx    Rectal cancer Neg Hx    Stomach cancer Neg Hx     Social History   Socioeconomic History   Marital status: Married    Spouse name: Not on file   Number of children: 3   Years of education: 12   Highest education level: GED or equivalent  Occupational History   Occupation: Clinical Biochemist  Tobacco Use   Smoking status: Former    Current packs/day: 0.00    Average packs/day: 0.3 packs/day for 13.0 years (3.3 ttl pk-yrs)    Types: Cigarettes    Start date: 03/13/1990    Quit date: 03/14/2003    Years since quitting: 20.8   Smokeless tobacco: Never  Vaping Use   Vaping status: Some Days   Substances: Flavoring   Devices: flavored , non-nicotine  Substance and Sexual  Activity   Alcohol use: Yes    Alcohol/week: 0.0 standard drinks of alcohol    Comment: occasional    Drug use: No   Sexual activity: Not Currently  Other Topics Concern   Not on file  Social History Narrative   Fun: Camping, Cruise    Denies abuse and feels safe at home.    Social Drivers of Health   Financial Resource Strain: Medium Risk (10/13/2023)   Overall Financial Resource Strain (CARDIA)    Difficulty of Paying Living Expenses: Somewhat hard  Food Insecurity: Low Risk  (12/18/2023)   Received from [facility]   Hunger Vital Sign    Within the past 12 months, you worried that your food would run out before you got money to buy more: Never true    Within the past 12 months, the food you bought just didn't last and you didn't have money to get more. : Never true  Recent Concern: Food Insecurity - Food Insecurity Present (10/13/2023)   Hunger  Vital Sign    Worried About Running Out of Food in the Last Year: Sometimes true    Ran Out of Food in the Last Year: Never true  Transportation Needs: No Transportation Needs (12/18/2023)   Received from [facility]    In the past 12 months, has lack of reliable transportation kept you from medical appointments, meetings, work or from getting things needed for daily living? : No  Physical Activity: Insufficiently Active (10/13/2023)   Exercise Vital Sign    Days of Exercise per Week: 3 days    Minutes of Exercise per Session: 30 min  Stress: No Stress Concern Present (10/13/2023)   [employer] of Occupational Health - Occupational Stress Questionnaire    Feeling of Stress: Only a little  Social Connections: Moderately Integrated (10/13/2023)   Social Connection and Isolation Panel    Frequency of Communication with Friends and Family: More than three times a week    Frequency of Social Gatherings with Friends and Family: Once a week    Attends Religious Services: More than 4 times per year    Active Member of [employer] or Organizations: No    Attends Engineer, Structural: Not on file    Marital Status: Married  Catering Manager Violence: Not At Risk (05/13/2023)   Humiliation, Afraid, Rape, and Kick questionnaire    Fear of Current or Ex-Partner: No    Emotionally Abused: No    Physically Abused: No    Sexually Abused: No    ROS      Objective    There were no vitals taken for this visit.  Physical Exam      Assessment & Plan:   Problem List Items Addressed This Visit   None Visit Diagnoses       Erroneous encounter - disregard    -  Primary       No follow-ups on file.   Boby Mackintosh, NP-C

## 2024-01-28 ENCOUNTER — Ambulatory Visit (INDEPENDENT_AMBULATORY_CARE_PROVIDER_SITE_OTHER)

## 2024-01-28 ENCOUNTER — Other Ambulatory Visit

## 2024-01-28 DIAGNOSIS — E538 Deficiency of other specified B group vitamins: Secondary | ICD-10-CM

## 2024-01-28 MED ORDER — CYANOCOBALAMIN 1000 MCG/ML IJ SOLN
1000.0000 ug | Freq: Once | INTRAMUSCULAR | Status: AC
  Administered 2024-01-28: 1000 ug via INTRAMUSCULAR

## 2024-01-28 NOTE — Progress Notes (Signed)
 Patient visits today to receive her B12 injection/vaccine. Patient was informed and tolerated well. Patient was notified to reach out to [place] if needed.

## 2024-02-05 ENCOUNTER — Ambulatory Visit

## 2024-02-28 ENCOUNTER — Ambulatory Visit

## 2024-02-28 DIAGNOSIS — E538 Deficiency of other specified B group vitamins: Secondary | ICD-10-CM | POA: Diagnosis not present

## 2024-02-28 MED ORDER — CYANOCOBALAMIN 1000 MCG/ML IJ SOLN
1000.0000 ug | Freq: Once | INTRAMUSCULAR | Status: AC
  Administered 2024-02-28: 1000 ug via INTRAMUSCULAR

## 2024-02-28 NOTE — Patient Instructions (Signed)
 SABRA

## 2024-02-28 NOTE — Progress Notes (Signed)
 After obtaining consent, and per orders of Boby Mackintosh, NP-C, injection of B12 given by Edsel CHRISTELLA Kerns. Patient tolerated procedure well.

## 2024-03-10 DIAGNOSIS — E1169 Type 2 diabetes mellitus with other specified complication: Secondary | ICD-10-CM
